# Patient Record
Sex: Female | Born: 1935 | Race: White | Hispanic: No | Smoking: Former smoker
Health system: Southern US, Community
[De-identification: ages and names within clinical notes are randomized; demographics above are authoritative.]

## PROBLEM LIST (undated history)

## (undated) DIAGNOSIS — E039 Hypothyroidism, unspecified: Secondary | ICD-10-CM

## (undated) DIAGNOSIS — C801 Malignant (primary) neoplasm, unspecified: Secondary | ICD-10-CM

## (undated) DIAGNOSIS — Z85118 Personal history of other malignant neoplasm of bronchus and lung: Secondary | ICD-10-CM

## (undated) DIAGNOSIS — M1990 Unspecified osteoarthritis, unspecified site: Secondary | ICD-10-CM

## (undated) DIAGNOSIS — I10 Essential (primary) hypertension: Secondary | ICD-10-CM

## (undated) DIAGNOSIS — J449 Chronic obstructive pulmonary disease, unspecified: Secondary | ICD-10-CM

## (undated) DIAGNOSIS — I809 Phlebitis and thrombophlebitis of unspecified site: Secondary | ICD-10-CM

## (undated) DIAGNOSIS — R0982 Postnasal drip: Secondary | ICD-10-CM

## (undated) DIAGNOSIS — N189 Chronic kidney disease, unspecified: Secondary | ICD-10-CM

## (undated) HISTORY — PX: OTHER SURGICAL HISTORY: SHX169

## (undated) HISTORY — DX: Chronic obstructive pulmonary disease, unspecified: J44.9

## (undated) HISTORY — DX: Hypothyroidism, unspecified: E03.9

## (undated) HISTORY — PX: VESICOVAGINAL FISTULA CLOSURE W/ TAH: SUR271

## (undated) HISTORY — PX: JOINT REPLACEMENT: SHX530

## (undated) HISTORY — DX: Unspecified osteoarthritis, unspecified site: M19.90

## (undated) HISTORY — PX: THYROID SURGERY: SHX805

## (undated) HISTORY — DX: Postnasal drip: R09.82

## (undated) HISTORY — PX: CATARACT EXTRACTION, BILATERAL: SHX1313

## (undated) HISTORY — DX: Personal history of other malignant neoplasm of bronchus and lung: Z85.118

## (undated) HISTORY — PX: TOTAL KNEE ARTHROPLASTY: SHX125

## (undated) HISTORY — PX: THYROIDECTOMY: SHX17

## (undated) HISTORY — PX: ABDOMINAL HYSTERECTOMY: SHX81

## (undated) HISTORY — DX: Essential (primary) hypertension: I10

---

## 1980-11-25 DIAGNOSIS — Z85118 Personal history of other malignant neoplasm of bronchus and lung: Secondary | ICD-10-CM

## 1980-11-25 HISTORY — PX: OTHER SURGICAL HISTORY: SHX169

## 1980-11-25 HISTORY — DX: Personal history of other malignant neoplasm of bronchus and lung: Z85.118

## 1999-09-25 ENCOUNTER — Encounter: Admission: RE | Admit: 1999-09-25 | Discharge: 1999-11-01 | Payer: Self-pay | Admitting: Orthopedic Surgery

## 1999-12-19 ENCOUNTER — Encounter: Admission: RE | Admit: 1999-12-19 | Discharge: 1999-12-19 | Payer: Self-pay | Admitting: Family Medicine

## 1999-12-19 ENCOUNTER — Encounter: Payer: Self-pay | Admitting: Family Medicine

## 1999-12-20 ENCOUNTER — Encounter: Admission: RE | Admit: 1999-12-20 | Discharge: 1999-12-20 | Payer: Self-pay | Admitting: Internal Medicine

## 1999-12-20 ENCOUNTER — Encounter: Payer: Self-pay | Admitting: Internal Medicine

## 2000-12-22 ENCOUNTER — Encounter: Admission: RE | Admit: 2000-12-22 | Discharge: 2000-12-22 | Payer: Self-pay | Admitting: Internal Medicine

## 2000-12-22 ENCOUNTER — Encounter: Payer: Self-pay | Admitting: Internal Medicine

## 2002-01-01 ENCOUNTER — Encounter: Admission: RE | Admit: 2002-01-01 | Discharge: 2002-01-01 | Payer: Self-pay | Admitting: Internal Medicine

## 2002-01-01 ENCOUNTER — Encounter: Payer: Self-pay | Admitting: Internal Medicine

## 2003-01-19 ENCOUNTER — Encounter: Admission: RE | Admit: 2003-01-19 | Discharge: 2003-01-19 | Payer: Self-pay | Admitting: Internal Medicine

## 2003-01-19 ENCOUNTER — Encounter: Payer: Self-pay | Admitting: Internal Medicine

## 2003-11-29 ENCOUNTER — Encounter: Admission: RE | Admit: 2003-11-29 | Discharge: 2003-11-29 | Payer: Self-pay | Admitting: Internal Medicine

## 2004-02-21 ENCOUNTER — Encounter: Admission: RE | Admit: 2004-02-21 | Discharge: 2004-02-21 | Payer: Self-pay | Admitting: Internal Medicine

## 2004-05-24 ENCOUNTER — Encounter: Admission: RE | Admit: 2004-05-24 | Discharge: 2004-05-24 | Payer: Self-pay | Admitting: Internal Medicine

## 2004-06-13 ENCOUNTER — Encounter: Admission: RE | Admit: 2004-06-13 | Discharge: 2004-06-13 | Payer: Self-pay | Admitting: Pulmonary Disease

## 2005-11-04 ENCOUNTER — Ambulatory Visit: Payer: Self-pay | Admitting: Internal Medicine

## 2005-12-24 ENCOUNTER — Encounter: Admission: RE | Admit: 2005-12-24 | Discharge: 2005-12-24 | Payer: Self-pay | Admitting: Internal Medicine

## 2005-12-31 ENCOUNTER — Ambulatory Visit: Payer: Self-pay | Admitting: Internal Medicine

## 2006-12-08 ENCOUNTER — Ambulatory Visit: Payer: Self-pay | Admitting: Internal Medicine

## 2007-01-21 ENCOUNTER — Ambulatory Visit: Payer: Self-pay | Admitting: Internal Medicine

## 2007-01-21 LAB — CONVERTED CEMR LAB
ALT: 41 units/L — ABNORMAL HIGH (ref 0–40)
AST: 31 units/L (ref 0–37)
Albumin: 3.5 g/dL (ref 3.5–5.2)
Alkaline Phosphatase: 54 units/L (ref 39–117)
BUN: 38 mg/dL — ABNORMAL HIGH (ref 6–23)
Bilirubin, Direct: 0.1 mg/dL (ref 0.0–0.3)
CO2: 27 meq/L (ref 19–32)
Calcium: 9.5 mg/dL (ref 8.4–10.5)
Chloride: 103 meq/L (ref 96–112)
Creatinine, Ser: 1.6 mg/dL — ABNORMAL HIGH (ref 0.4–1.2)
GFR calc Af Amer: 40 mL/min
GFR calc non Af Amer: 33 mL/min
Glucose, Bld: 99 mg/dL (ref 70–99)
Potassium: 4.2 meq/L (ref 3.5–5.1)
Sodium: 140 meq/L (ref 135–145)
TSH: 0.8 microintl units/mL (ref 0.35–5.50)
Total Bilirubin: 0.7 mg/dL (ref 0.3–1.2)
Total Protein: 6.5 g/dL (ref 6.0–8.3)

## 2007-02-18 ENCOUNTER — Encounter: Admission: RE | Admit: 2007-02-18 | Discharge: 2007-02-18 | Payer: Self-pay | Admitting: Internal Medicine

## 2007-06-19 DIAGNOSIS — M1990 Unspecified osteoarthritis, unspecified site: Secondary | ICD-10-CM | POA: Insufficient documentation

## 2007-06-19 DIAGNOSIS — I10 Essential (primary) hypertension: Secondary | ICD-10-CM | POA: Insufficient documentation

## 2007-06-19 DIAGNOSIS — E039 Hypothyroidism, unspecified: Secondary | ICD-10-CM | POA: Insufficient documentation

## 2007-06-19 DIAGNOSIS — C3410 Malignant neoplasm of upper lobe, unspecified bronchus or lung: Secondary | ICD-10-CM | POA: Insufficient documentation

## 2007-07-20 ENCOUNTER — Ambulatory Visit: Payer: Self-pay | Admitting: Internal Medicine

## 2007-07-20 DIAGNOSIS — R252 Cramp and spasm: Secondary | ICD-10-CM | POA: Insufficient documentation

## 2007-07-20 DIAGNOSIS — N259 Disorder resulting from impaired renal tubular function, unspecified: Secondary | ICD-10-CM | POA: Insufficient documentation

## 2007-07-22 LAB — CONVERTED CEMR LAB
BUN: 34 mg/dL — ABNORMAL HIGH (ref 6–23)
CO2: 29 meq/L (ref 19–32)
Calcium: 9.9 mg/dL (ref 8.4–10.5)
Chloride: 104 meq/L (ref 96–112)
Creatinine, Ser: 1.5 mg/dL — ABNORMAL HIGH (ref 0.4–1.2)
GFR calc Af Amer: 43 mL/min
GFR calc non Af Amer: 36 mL/min
Glucose, Bld: 84 mg/dL (ref 70–99)
Potassium: 4.5 meq/L (ref 3.5–5.1)
Sodium: 140 meq/L (ref 135–145)
TSH: 0.15 microintl units/mL — ABNORMAL LOW (ref 0.35–5.50)

## 2007-09-18 ENCOUNTER — Ambulatory Visit: Payer: Self-pay | Admitting: Internal Medicine

## 2007-10-16 ENCOUNTER — Telehealth: Payer: Self-pay | Admitting: Internal Medicine

## 2007-12-25 ENCOUNTER — Ambulatory Visit: Payer: Self-pay | Admitting: Internal Medicine

## 2007-12-29 ENCOUNTER — Telehealth (INDEPENDENT_AMBULATORY_CARE_PROVIDER_SITE_OTHER): Payer: Self-pay | Admitting: *Deleted

## 2008-01-11 ENCOUNTER — Encounter: Payer: Self-pay | Admitting: Internal Medicine

## 2008-02-24 ENCOUNTER — Encounter: Admission: RE | Admit: 2008-02-24 | Discharge: 2008-02-24 | Payer: Self-pay | Admitting: Orthopedic Surgery

## 2008-02-24 HISTORY — PX: OTHER SURGICAL HISTORY: SHX169

## 2008-02-25 ENCOUNTER — Inpatient Hospital Stay (HOSPITAL_COMMUNITY): Admission: RE | Admit: 2008-02-25 | Discharge: 2008-02-29 | Payer: Self-pay | Admitting: Orthopedic Surgery

## 2008-02-26 ENCOUNTER — Ambulatory Visit: Payer: Self-pay | Admitting: Vascular Surgery

## 2008-02-26 ENCOUNTER — Encounter (INDEPENDENT_AMBULATORY_CARE_PROVIDER_SITE_OTHER): Payer: Self-pay | Admitting: Orthopedic Surgery

## 2008-06-28 ENCOUNTER — Encounter: Admission: RE | Admit: 2008-06-28 | Discharge: 2008-06-28 | Payer: Self-pay | Admitting: Internal Medicine

## 2010-12-16 ENCOUNTER — Encounter: Payer: Self-pay | Admitting: Internal Medicine

## 2010-12-23 LAB — CONVERTED CEMR LAB
BUN: 19 mg/dL (ref 6–23)
Basophils Relative: 0.6 % (ref 0.0–1.0)
CO2: 30 meq/L (ref 19–32)
Calcium: 10.1 mg/dL (ref 8.4–10.5)
Creatinine, Ser: 1.2 mg/dL (ref 0.4–1.2)
Eosinophils Relative: 2.9 % (ref 0.0–5.0)
GFR calc non Af Amer: 46 mL/min
HCT: 42.5 % (ref 36.0–46.0)
Lymphocytes Relative: 35.2 % (ref 12.0–46.0)
Platelets: 218 10*3/uL (ref 150–400)
Potassium: 4.3 meq/L (ref 3.5–5.1)
RDW: 12 % (ref 11.5–14.6)
Sodium: 140 meq/L (ref 135–145)
TSH: 3.71 microintl units/mL (ref 0.35–5.50)

## 2011-04-09 NOTE — Discharge Summary (Signed)
NAMELYNDLEY, JUVERA NO.:  [id number]   MEDICAL RECORD NO.:  [id number]          PATIENT TYPE:  INP   LOCATION:  5012                         FACILITY:  [facility]   PHYSICIAN:  Metta Clines. Supple, M.D.  DATE OF BIRTH:  1929/05/25   DATE OF ADMISSION:  02/25/2008  DATE OF DISCHARGE:  02/29/2008                               DISCHARGE SUMMARY   ADMITTING DIAGNOSES:  1. Osteoarthrosis right knee.  2. Hypertension.  3. History of lung cancer.  4. Recent finding of increased left upper lobe mass with CT scan      recommending biopsy and followup.   DISCHARGE DIAGNOSES:  1. Osteoarthrosis right knee.  2. Hypertension.  3. History of lung cancer.  4. Recent finding of increased left upper lobe mass with CT scan      recommending biopsy and followup.   OPERATIONS:  Right total knee arthroplasty.   SURGEON:  Metta Clines. Supple, M.D.   Terrence DupontOlivia Mackie A. Shuford, P.A.-C.   ANESTHESIA:  General with femoral nerve block.   BRIEF HISTORY:  Ms. Casarella is a pleasant 77 year old female who has  been followed by [place] outpatient for end-stage osteoarthrosis of her right  knee.  The patient has failed outpatient conservative measures and had  bone on bone deformity on plain film x-rays.  The risks and benefits  were discussed with the patient.  The preoperative workup did show some  enlargement of a previous left upper lobe mass and she had a remote  history of lung cancer.  She had had a previous workup by CT scan back  in 2004.  It was recommended that this could be followed up as an  outpatient after the elective knee replacement.  This will be arranged.   HOSPITAL COURSE:  The patient was admitted and underwent the above named  procedure and tolerated this well.  All appropriate IV antibiotics and  analgesics were utilized.  Postoperatively she was placed on DVT  prophylaxis with Coumadin and all IV antibiotics and analgesics were  utilized.  She was begun standard formal  physical therapy weightbearing  as tolerated.  She transitioned well.  Due to her home situation at a  skilled bed short term nursing facility rehabilitation was felt to be  most appropriate.  The patient remained afebrile through the weekend.  On today's date, Sunday, February 28, 2008, the patient was afebrile.  She  was eating and doing well on p.o. analgesics.  Her incision was clean  and dry.  Calf was soft and nontender.  She had had a negative Doppler  postoperatively as she did have some calf pain on the first day  postoperatively.  At this time she was hemodynamically stable and ready  for discharge tentatively to follow to [place].   LABORATORY DATA:  Shows admission labs all within normal limits.  Postoperatively remained hemodynamically stable.  Hemoglobin was stable  on both April 4 and 5 at 8.0.  Chemistries postoperatively also within  normal limits.  Chest x-ray and CT scan as above.  EKG shows sinus  rhythm with occasional  PVCs.   CONDITION ON DISCHARGE:  Stable and improved.   DISCHARGE MEDICATIONS AND PLANS:  The patient is being discharged to  skilled care facility where she will be weightbearing as tolerated,  total knee replacement protocol.  She may shower on day 5 from surgery.  Leave Steri-Strips on the skin.  Will apply clean dry dressing as  needed.  She should follow up in our office at 2 weeks from surgery.  Please call and arrange time and transportation.  She is on a regular  diet.  She is on the following medications:   DISCHARGE MEDICATIONS:  1. Premarin 0.3 mg daily.  This is currently on hold while on      Coumadin.  2. Synthroid 50 mcg daily.  3. Tenormin 50 mg daily.  4. Zestril/Prinivil 20 mg daily.  5. Coumadin per pharmacy protocol.  6. Docusate sodium 100 mg daily.  7. Tylenol 1-2 every 4-6 p.r.n. pain.  8. Robaxin 500 mg one every 8 p.r.n. spasm.  9. Ambien 5 mg daily at bedtime p.r.n. sleep.  10.Vicodin 1-2 every 4-6 p.r.n.  pain.   Call our office for any further difficulties or problems.  Again,  condition on discharge stable and improved.      Tracy A. Shuford, P.A.-C.      Metta Clines. Supple, M.D.  Electronically Signed    TAS/MEDQ  D:  02/28/2008  T:  02/28/2008  Job:  [id number]

## 2011-04-09 NOTE — Op Note (Signed)
Valerie Young, Valerie Young NO.:  [id number]   MEDICAL RECORD NO.:  [id number]          PATIENT TYPE:  INP   LOCATION:  2899                         FACILITY:  [facility]   PHYSICIAN:  Metta Clines. Supple, M.D.  DATE OF BIRTH:  [birth year]   DATE OF PROCEDURE:  02/25/2008  DATE OF DISCHARGE:                               OPERATIVE REPORT   PREOPERATIVE DIAGNOSIS:  End-stage right knee osteoarthrosis.   POSTOPERATIVE DIAGNOSIS:  End-stage right knee osteoarthrosis.   PROCEDURE:  Cemented right total knee arthroplasty utilizing a DePuy  segment implant, size 2.5 femur and size 3 tibia, a 15-mm thick rotating  platform, polyethylene insert, and a 32-mm patellar button.   SURGEON:  Metta Clines. Supple, M.D.   ASSISTANTOlivia Mackie Shuford, P.A.-C.   ANESTHESIA:  With an LMA, general as well as a femoral nerve block.   TOURNIQUET TIME:  50 minutes.   ESTIMATED BLOOD LOSS:  Minimal.   DRAINS:  Hemovac x1.   HISTORY:  Valerie Young is a 77 year old female who has had chronic  progressively increasing right knee pain secondary to end-stage  osteoarthrosis.  She is brought to the operating room at this time for  planned right total knee arthroplasty.   Preoperatively, we counseled Valerie Young on treatment options as well as  risks versus benefits thereof.  Possible surgical complications of  bleeding, infection, neurovascular injury, DVT, PE, persistent pain,  loss of motion and possible need for additional surgery were reviewed.  She understands and accepts and agrees with planned procedure.   PROCEDURE IN DETAIL:  After undergoing routine preoperative evaluation  including a chest CT scan for evaluation of a chronic pulmonary nodule  which did show some slight enlargement, I did discuss this with the  radiologist and they recommended ultimately a biopsy of the mass.  It  did not appear, however, to be compromising her pulmonary function, and  it was felt that this was clearly a chronic  situation, that any further  diagnostic workup could be postponed until she had completed her surgery  and subsequent postoperative recovery.  I did discuss this with Ms.  Young and she was in agreement.   The patient ultimately received prophylactic antibiotics, and a femoral  nerve block was established in the holding area by the anesthesia  department.  She was placed supine on the operating table and underwent  smooth induction of an LMA general anesthesia.  A Foley catheter was  placed.  The right leg was then sterilely prepped and draped in standard  fashion.  The leg was exsanguinated with the tourniquet inflated to 300  mmHg.   A midline incision was then made approximately 12 cm in length centered  over the patella.  Skin flaps were circumferentially mobilized.  Medial  parapatellar arthrotomy was then performed and the patella everted.  The  fat pad was debulked.  The remnants of the menisci and cruciate  ligaments were excised.  The knee was flexed up, and a drill was used to  gain access to the femoral medullary canal with an intramedullary guide  being passed.  We then resected 11 mm of bone from the distal femur with  a 5-degree valgus cut.  The distal femur was then measured at between a  2.5 and 3, and we proceeded with the 2.5 chamfer cuts.  The distal femur  was then trialed, and this showed excellent fit.  We then turned our  attention to the proximal tibia with the extramedullary guide used to  measure resection measured 10 mm from the lateral tibial plateau, and  this section of bone was then removed with an oscillating saw.  We then  sized the proximal tibia, and the size 3 had the best coverage.  This  was then pinned into position.  We performed a trial reduction with the  implants, and this showed excellent knee mobility with good motion and  good soft tissue balance.  We then completed the preparation of the  proximal tibia using the intramedullary reamer and  then broach.  We then  completed the distal femur with the box cutting guide and then performed  a final trial reduction with the box  input  and good sizing of the  implants, good soft tissue balance, and excellent knee motion.  Our  attention was then turned to the patella where we resected approximately  8 mm of bone from along the articular surface and then applied the  stabilizing drill guide for drilling the stabilizing drill holes, and  the trial 32-mm button again showed good coverage of the patella.  At  this point, an osteotome was used to remove bone fragments from the  posterior aspect of the femoral condyles.  The posterior compartments  were meticulously debrided of all residual meniscal and soft tissue  debris.  Pulsatile lavage was used to meticulously clean the joint.  We  then mixed the cement, and at the appropriate consistency, the implants  were all cemented into position.  Meticulous removal all extra cement  was completed.  The knee was then taken through a range of motion and  showed good soft tissue balance.  We performed trials with 12.5 and 15-  mm implants.  The 15-mm implant had the best soft tissue balance.  The  final 15-mm tibial insert was then placed.  The knee was again taken  through a final motion, showing excellent soft tissue balance.  At this  point, we then let down the tourniquet.  A Hemovac drain was brought out  superolaterally.  Hemostasis was obtained and the parapatellar  arthrotomy was closed with interrupted figure-of-eight #1 Vicryl  sutures, 2-0 Vicryl for the subcu and intracuticular 3-0 Monocryl for  the skin followed by Steri-Strips.  A bulky sterile dressing was  applied.  We did give an additional gram of Ancef when the tourniquet  was let down.  The patient was then extubated and taken to the recovery  room in stable condition.      Metta Clines. Supple, M.D.  Electronically Signed     KMS/MEDQ  D:  02/25/2008  T:  02/25/2008   Job:  [id number]

## 2011-08-19 LAB — COMPREHENSIVE METABOLIC PANEL
ALT: 17
Alkaline Phosphatase: 67
BUN: 19
Calcium: 9.7
Chloride: 104
GFR calc non Af Amer: 39 — ABNORMAL LOW
Potassium: 4.2
Sodium: 138

## 2011-08-19 LAB — PROTIME-INR: INR: 0.9

## 2011-08-19 LAB — CBC
HCT: 39.8
MCV: 97.4
RBC: 4.09
RDW: 12.8
WBC: 10.3

## 2011-08-19 LAB — URINALYSIS, ROUTINE W REFLEX MICROSCOPIC
Hgb urine dipstick: NEGATIVE
Protein, ur: NEGATIVE

## 2011-08-20 LAB — BASIC METABOLIC PANEL
BUN: 15
CO2: 22
Chloride: 103
Chloride: 105
GFR calc non Af Amer: 42 — ABNORMAL LOW
GFR calc non Af Amer: 44 — ABNORMAL LOW
Glucose, Bld: 134 — ABNORMAL HIGH
Potassium: 4.3
Potassium: 4.5
Sodium: 137

## 2011-08-20 LAB — CBC
HCT: 23.3 — ABNORMAL LOW
HCT: 28.4 — ABNORMAL LOW
Hemoglobin: 8 — ABNORMAL LOW
MCHC: 33.8
MCHC: 33.9
MCV: 97
Platelets: 136 — ABNORMAL LOW
RBC: 2.38 — ABNORMAL LOW
RDW: 12.7
RDW: 12.9
WBC: 11.9 — ABNORMAL HIGH
WBC: 13.8 — ABNORMAL HIGH

## 2011-08-20 LAB — TYPE AND SCREEN
ABO/RH(D): O POS
Antibody Screen: NEGATIVE

## 2011-08-20 LAB — PROTIME-INR
INR: 3.2 — ABNORMAL HIGH
Prothrombin Time: 14.7
Prothrombin Time: 20.9 — ABNORMAL HIGH
Prothrombin Time: 25.3 — ABNORMAL HIGH

## 2011-08-20 LAB — HEMOGLOBIN AND HEMATOCRIT, BLOOD: HCT: 28 — ABNORMAL LOW

## 2012-03-05 DIAGNOSIS — M79609 Pain in unspecified limb: Secondary | ICD-10-CM | POA: Diagnosis not present

## 2012-03-05 DIAGNOSIS — Z1331 Encounter for screening for depression: Secondary | ICD-10-CM | POA: Diagnosis not present

## 2012-04-07 DIAGNOSIS — G5760 Lesion of plantar nerve, unspecified lower limb: Secondary | ICD-10-CM | POA: Diagnosis not present

## 2012-05-05 DIAGNOSIS — G5760 Lesion of plantar nerve, unspecified lower limb: Secondary | ICD-10-CM | POA: Diagnosis not present

## 2012-06-05 DIAGNOSIS — M79609 Pain in unspecified limb: Secondary | ICD-10-CM | POA: Diagnosis not present

## 2012-06-05 DIAGNOSIS — I10 Essential (primary) hypertension: Secondary | ICD-10-CM | POA: Diagnosis not present

## 2012-06-05 DIAGNOSIS — E039 Hypothyroidism, unspecified: Secondary | ICD-10-CM | POA: Diagnosis not present

## 2012-06-05 DIAGNOSIS — E559 Vitamin D deficiency, unspecified: Secondary | ICD-10-CM | POA: Diagnosis not present

## 2012-06-05 DIAGNOSIS — N183 Chronic kidney disease, stage 3 unspecified: Secondary | ICD-10-CM | POA: Diagnosis not present

## 2012-06-05 DIAGNOSIS — J310 Chronic rhinitis: Secondary | ICD-10-CM | POA: Diagnosis not present

## 2012-06-05 DIAGNOSIS — M1710 Unilateral primary osteoarthritis, unspecified knee: Secondary | ICD-10-CM | POA: Diagnosis not present

## 2012-06-05 DIAGNOSIS — IMO0002 Reserved for concepts with insufficient information to code with codable children: Secondary | ICD-10-CM | POA: Diagnosis not present

## 2012-06-05 DIAGNOSIS — Z1331 Encounter for screening for depression: Secondary | ICD-10-CM | POA: Diagnosis not present

## 2012-07-14 DIAGNOSIS — E860 Dehydration: Secondary | ICD-10-CM | POA: Diagnosis not present

## 2012-10-02 DIAGNOSIS — R61 Generalized hyperhidrosis: Secondary | ICD-10-CM | POA: Diagnosis not present

## 2012-10-02 DIAGNOSIS — R350 Frequency of micturition: Secondary | ICD-10-CM | POA: Diagnosis not present

## 2012-10-06 DIAGNOSIS — I10 Essential (primary) hypertension: Secondary | ICD-10-CM | POA: Diagnosis not present

## 2012-10-06 DIAGNOSIS — Z79899 Other long term (current) drug therapy: Secondary | ICD-10-CM | POA: Diagnosis not present

## 2012-10-06 DIAGNOSIS — R350 Frequency of micturition: Secondary | ICD-10-CM | POA: Diagnosis not present

## 2012-10-06 DIAGNOSIS — Z23 Encounter for immunization: Secondary | ICD-10-CM | POA: Diagnosis not present

## 2012-10-06 DIAGNOSIS — M1710 Unilateral primary osteoarthritis, unspecified knee: Secondary | ICD-10-CM | POA: Diagnosis not present

## 2012-10-06 DIAGNOSIS — Z1331 Encounter for screening for depression: Secondary | ICD-10-CM | POA: Diagnosis not present

## 2012-10-06 DIAGNOSIS — Z0000 Encounter for general adult medical examination without abnormal findings: Secondary | ICD-10-CM | POA: Diagnosis not present

## 2012-10-06 DIAGNOSIS — N183 Chronic kidney disease, stage 3 unspecified: Secondary | ICD-10-CM | POA: Diagnosis not present

## 2012-10-06 DIAGNOSIS — R61 Generalized hyperhidrosis: Secondary | ICD-10-CM | POA: Diagnosis not present

## 2012-10-06 DIAGNOSIS — IMO0002 Reserved for concepts with insufficient information to code with codable children: Secondary | ICD-10-CM | POA: Diagnosis not present

## 2012-10-06 DIAGNOSIS — J310 Chronic rhinitis: Secondary | ICD-10-CM | POA: Diagnosis not present

## 2012-10-07 ENCOUNTER — Other Ambulatory Visit: Payer: Self-pay | Admitting: Internal Medicine

## 2012-10-07 DIAGNOSIS — R350 Frequency of micturition: Secondary | ICD-10-CM

## 2012-10-07 DIAGNOSIS — R7989 Other specified abnormal findings of blood chemistry: Secondary | ICD-10-CM

## 2012-10-07 DIAGNOSIS — R61 Generalized hyperhidrosis: Secondary | ICD-10-CM

## 2012-10-08 ENCOUNTER — Ambulatory Visit
Admission: RE | Admit: 2012-10-08 | Discharge: 2012-10-08 | Disposition: A | Discharge status: Home / Self Care | Payer: Medicare Other | Source: Ambulatory Visit | Attending: Internal Medicine | Admitting: Internal Medicine

## 2012-10-08 ENCOUNTER — Other Ambulatory Visit: Payer: Self-pay | Admitting: Internal Medicine

## 2012-10-08 DIAGNOSIS — R350 Frequency of micturition: Secondary | ICD-10-CM

## 2012-10-08 DIAGNOSIS — R61 Generalized hyperhidrosis: Secondary | ICD-10-CM

## 2012-10-08 DIAGNOSIS — R7989 Other specified abnormal findings of blood chemistry: Secondary | ICD-10-CM

## 2012-10-08 DIAGNOSIS — R944 Abnormal results of kidney function studies: Secondary | ICD-10-CM | POA: Diagnosis not present

## 2012-10-08 DIAGNOSIS — R918 Other nonspecific abnormal finding of lung field: Secondary | ICD-10-CM

## 2012-10-13 ENCOUNTER — Ambulatory Visit
Admission: RE | Admit: 2012-10-13 | Discharge: 2012-10-13 | Disposition: A | Discharge status: Home / Self Care | Payer: Medicare Other | Source: Ambulatory Visit | Attending: Internal Medicine | Admitting: Internal Medicine

## 2012-10-13 DIAGNOSIS — R918 Other nonspecific abnormal finding of lung field: Secondary | ICD-10-CM

## 2012-10-13 DIAGNOSIS — J438 Other emphysema: Secondary | ICD-10-CM | POA: Diagnosis not present

## 2012-10-13 DIAGNOSIS — R911 Solitary pulmonary nodule: Secondary | ICD-10-CM | POA: Diagnosis not present

## 2012-10-20 DIAGNOSIS — R918 Other nonspecific abnormal finding of lung field: Secondary | ICD-10-CM | POA: Diagnosis not present

## 2012-10-20 DIAGNOSIS — N183 Chronic kidney disease, stage 3 unspecified: Secondary | ICD-10-CM | POA: Diagnosis not present

## 2012-10-20 DIAGNOSIS — I10 Essential (primary) hypertension: Secondary | ICD-10-CM | POA: Diagnosis not present

## 2012-10-20 DIAGNOSIS — R350 Frequency of micturition: Secondary | ICD-10-CM | POA: Diagnosis not present

## 2012-10-20 DIAGNOSIS — N2581 Secondary hyperparathyroidism of renal origin: Secondary | ICD-10-CM | POA: Diagnosis not present

## 2012-10-20 DIAGNOSIS — R61 Generalized hyperhidrosis: Secondary | ICD-10-CM | POA: Diagnosis not present

## 2012-10-20 DIAGNOSIS — M159 Polyosteoarthritis, unspecified: Secondary | ICD-10-CM | POA: Diagnosis not present

## 2012-10-20 DIAGNOSIS — E039 Hypothyroidism, unspecified: Secondary | ICD-10-CM | POA: Diagnosis not present

## 2012-11-03 ENCOUNTER — Other Ambulatory Visit (HOSPITAL_COMMUNITY): Payer: Self-pay | Admitting: Nephrology

## 2012-11-03 DIAGNOSIS — N183 Chronic kidney disease, stage 3 unspecified: Secondary | ICD-10-CM

## 2012-11-10 ENCOUNTER — Encounter (HOSPITAL_COMMUNITY)
Admission: RE | Admit: 2012-11-10 | Discharge: 2012-11-10 | Disposition: A | Discharge status: Home / Self Care | Payer: Medicare Other | Source: Ambulatory Visit | Attending: Nephrology | Admitting: Nephrology

## 2012-11-10 DIAGNOSIS — N183 Chronic kidney disease, stage 3 unspecified: Secondary | ICD-10-CM

## 2012-11-10 DIAGNOSIS — R944 Abnormal results of kidney function studies: Secondary | ICD-10-CM | POA: Insufficient documentation

## 2012-11-10 DIAGNOSIS — N289 Disorder of kidney and ureter, unspecified: Secondary | ICD-10-CM | POA: Diagnosis not present

## 2012-11-10 MED ORDER — TECHNETIUM TC 99M MERTIATIDE
15.0000 | Freq: Once | INTRAVENOUS | Status: AC | PRN
  Administered 2012-11-10: 15 via INTRAVENOUS

## 2012-12-02 DIAGNOSIS — N183 Chronic kidney disease, stage 3 unspecified: Secondary | ICD-10-CM | POA: Diagnosis not present

## 2012-12-03 ENCOUNTER — Ambulatory Visit (INDEPENDENT_AMBULATORY_CARE_PROVIDER_SITE_OTHER): Payer: Medicare Other | Admitting: Internal Medicine

## 2012-12-03 ENCOUNTER — Other Ambulatory Visit (INDEPENDENT_AMBULATORY_CARE_PROVIDER_SITE_OTHER): Payer: Medicare Other

## 2012-12-03 ENCOUNTER — Ambulatory Visit (INDEPENDENT_AMBULATORY_CARE_PROVIDER_SITE_OTHER)
Admission: RE | Admit: 2012-12-03 | Discharge: 2012-12-03 | Disposition: A | Discharge status: Home / Self Care | Payer: Medicare Other | Source: Ambulatory Visit | Attending: Internal Medicine | Admitting: Internal Medicine

## 2012-12-03 ENCOUNTER — Encounter: Payer: Self-pay | Admitting: Internal Medicine

## 2012-12-03 VITALS — BP 140/80 | HR 69 | Ht 64.0 in | Wt 124.4 lb

## 2012-12-03 DIAGNOSIS — D499 Neoplasm of unspecified behavior of unspecified site: Secondary | ICD-10-CM

## 2012-12-03 DIAGNOSIS — R222 Localized swelling, mass and lump, trunk: Secondary | ICD-10-CM

## 2012-12-03 DIAGNOSIS — R918 Other nonspecific abnormal finding of lung field: Secondary | ICD-10-CM | POA: Diagnosis not present

## 2012-12-03 DIAGNOSIS — Z85118 Personal history of other malignant neoplasm of bronchus and lung: Secondary | ICD-10-CM | POA: Diagnosis not present

## 2012-12-03 DIAGNOSIS — R911 Solitary pulmonary nodule: Secondary | ICD-10-CM | POA: Insufficient documentation

## 2012-12-03 DIAGNOSIS — R9389 Abnormal findings on diagnostic imaging of other specified body structures: Secondary | ICD-10-CM

## 2012-12-03 DIAGNOSIS — J438 Other emphysema: Secondary | ICD-10-CM | POA: Diagnosis not present

## 2012-12-03 LAB — COMPREHENSIVE METABOLIC PANEL
Albumin: 4 g/dL (ref 3.5–5.2)
Alkaline Phosphatase: 60 U/L (ref 39–117)
CO2: 28 mEq/L (ref 19–32)
Calcium: 9.8 mg/dL (ref 8.4–10.5)
Chloride: 102 mEq/L (ref 96–112)
GFR: 37.06 mL/min — ABNORMAL LOW (ref >60.00)
Glucose, Bld: 105 mg/dL — ABNORMAL HIGH (ref 70–99)
Potassium: 3.6 mEq/L (ref 3.5–5.1)
Sodium: 138 mEq/L (ref 135–145)
Total Protein: 7.6 g/dL (ref 6.0–8.3)

## 2012-12-03 LAB — CBC
RBC: 3.88 Mil/uL (ref 3.87–5.11)
WBC: 9.5 10*3/uL (ref 4.5–10.5)

## 2012-12-03 LAB — APTT: aPTT: 25 s (ref 21.7–28.8)

## 2012-12-03 NOTE — Progress Notes (Signed)
12/03/12- [name] former smoker with an enlarging LUL Lesion- Dr Josetta Huddle. Daughter Valerie Young here. She was treated for urinary infection in November of 2013. At that time chest x-ray showed increase in a chronic left upper lobe density. She gives history that she had been evaluated at this office some years ago. I find no old records including a review of [facility], back to about 2005, of a pulmonologist evaluation. She thinks she just had imaging, no interventional procedure. She says the doctor told her he didn't know what she had at that she was stable, to be followed long-term. She insists she has felt well with no cough, dyspnea,  sputum, nothing bloody or purulent, no chest pain or adenopathy, no weight loss. I reviewed the CT images from November 2013 back to 2005. There has been marked progression since that time of an infiltrate in the left upper lobe, starting peripherally and now extending to the left hilum. She has history of  right upper lobectomy for cancer in May of 1981 in [place], without radiation or chemotherapy. Complicating health problems have included chronic kidney disease stage III, thyroid surgery for goiter. No hx TB exposure. She is retired from office work, divorced, living alone. Quit smoking in 1981. Denies family history of lung disease. CT chest 10/09/12: IMPRESSION:  1. Enlarging airspace opacity in the left upper lobe with  prominent air bronchograms. Although this could be some type of  chronic infiltrate a slow growing adenocarcinoma needs to be  excluded. Bronchoscopic or percutaneous biopsy may be indicated.  PET CT may not be helpful in this case.  2. Stable borderline mediastinal and hilar lymph nodes.  3. Stable 6 mm pulmonary nodule in the right lower lobe.  4. Stable underlying emphysematous changes.  Original Report Authenticated By: Marijo Sanes, M.D.   Prior to Admission medications   Medication Sig Start Date End Date Taking?  Authorizing Provider  atenolol (TENORMIN) 50 MG tablet Take 1 tablet by mouth daily. 09/30/12  Yes Historical Provider, MD  cholecalciferol (VITAMIN D) 1000 UNITS tablet Take 1,000 Units by mouth daily.   Yes Historical Provider, MD  estradiol (ESTRACE) 0.5 MG tablet Take 1 tablet by mouth daily. 09/30/12  Yes Historical Provider, MD  furosemide (LASIX) 20 MG tablet Take 20 mg by mouth daily.   Yes Historical Provider, MD  levothyroxine (SYNTHROID, LEVOTHROID) 50 MCG tablet Take 1 tablet by mouth daily. 09/30/12  Yes Historical Provider, MD  lisinopril (PRINIVIL,ZESTRIL) 20 MG tablet Take 1 tablet by mouth daily. 09/30/12  Yes Historical Provider, MD   Past Medical History  Diagnosis Date  . Hypertension   . Hypothyroidism   . COPD (chronic obstructive pulmonary disease)   . DJD (degenerative joint disease)     knees  . PND (post-nasal drip)     Chronic  . H/O: lung cancer 1982    Right; treated with resection   Past Surgical History  Procedure Date  . Total knee arthroplasty     Right  . Thyroidectomy   . Vesicovaginal fistula closure w/ tah   . Right lung cancer treated with resection 1982  . Right thigh postoperative hematoma 02-2008   Family History  Problem Relation Age of Onset  . CVA Mother 22    hemorrhagic    History   Social History  . Marital Status: Divorced    Spouse Name: N/A    Number of Children: N/A  . Years of Education: N/A   Occupational History  .  Retired    Social History Main Topics  . Smoking status: Former Smoker -- 1.0 packs/day for 35 years    Types: Cigarettes    Quit date: 11/25/1980  . Smokeless tobacco: Not on file  . Alcohol Use: No  . Drug Use: No  . Sexually Active: Not on file   Other Topics Concern  . Not on file   Social History Narrative  . No narrative on file   ROS-see HPI Constitutional:   No-   weight loss, night sweats, fevers, chills, fatigue, lassitude. HEENT:   No-  headaches, difficulty swallowing, tooth/dental  problems, sore throat,       No-  sneezing, itching, ear ache, nasal congestion, post nasal drip,  CV:  No-   chest pain, orthopnea, PND, swelling in lower extremities, anasarca, dizziness, palpitations Resp: No-   shortness of breath with exertion or at rest.              No-   productive cough,  No non-productive cough,  No- coughing up of blood.              No-   change in color of mucus.  No- wheezing.   Skin: No-   rash or lesions. GI:  No-   heartburn, indigestion, abdominal pain, nausea, vomiting, diarrhea,                 change in bowel habits, loss of appetite GU: No-   dysuria, change in color of urine, no urgency or frequency.  No- flank pain. MS:  No- acute  joint pain or swelling.  No- decreased range of motion.  No- back pain. Neuro-     nothing unusual Psych:  No- change in mood or affect. No depression or anxiety.  No memory loss.  OBJ- Physical Exam General- Alert, Oriented, Affect-appropriate, Distress- none acute,  Trim, looking younger than stated age Skin- rash-none, lesions- none, excoriation- none Lymphadenopathy- none Head- atraumatic            Eyes- Gross vision intact, PERRLA, conjunctivae and secretions clear            Ears- Hearing, canals-normal            Nose- Clear, no-Septal dev, mucus, polyps, erosion, perforation             Throat- Mallampati III , mucosa clear , drainage- none, tonsils- atrophic. Dentures Neck- flexible , trachea midline, no stridor , thyroid nl, carotid no bruit Chest - symmetrical excursion , unlabored           Heart/CV- RRR , no murmur , no gallop  , no rub, nl s1 s2                           - JVD- none , edema- none, stasis changes- none, varices- none           Lung- clear to P&A, wheeze- none, cough- none , dullness-none, rub- none           Chest wall- old right thoracotomy scar Abd- tender-no, distended-no, bowel sounds-present, HSM- no Br/ Gen/ Rectal- Not done, not indicated Extrem- cyanosis- none, clubbing, none,  atrophy- none, strength- nl Neuro- grossly intact to observation

## 2012-12-03 NOTE — Patient Instructions (Addendum)
Order- CXR   Left upper lobe mass  Order- lab   CBC, CMET, ANA, Sed rate, Pt, PTT  I will be discussing bronchoscopy with one of my colleagues here and we will call you soon about this.

## 2012-12-03 NOTE — Assessment & Plan Note (Signed)
There has been a dramatic progression since 2005, extending from periphery LUL to left hilum as of 09/2012  This is a slowly progressive adenocarcinoma until proven otherwise.  Plan-obtain chest x-ray, basic labs, schedule PFT. I've recommended bronchoscopy over needle biopsy- discuss with colleagues. Radiology review of the most recent CT did not favor a PET scan.

## 2012-12-04 ENCOUNTER — Telehealth: Payer: Self-pay | Admitting: Pulmonary Disease

## 2012-12-04 LAB — ANA: Anti Nuclear Antibody(ANA): NEGATIVE

## 2012-12-04 NOTE — Telephone Encounter (Signed)
See if she can do thurs.

## 2012-12-04 NOTE — Telephone Encounter (Signed)
KC, bronch cannot be done on wed., 12/09/12. There is availability on Thurs., 12/10/12 @ 7:30. Pls advise.

## 2012-12-04 NOTE — Progress Notes (Signed)
Quick Note:  Pt aware of results; states KC has already spoken with her about procedure to see what exactly it is. Paper chart ordered for CY. ______

## 2012-12-04 NOTE — Telephone Encounter (Signed)
The pt says Thurs., 12/10/12 is not good for her but Fri., 12/11/12 @ 7:30 would be good. Is this okay with you KC?

## 2012-12-04 NOTE — Telephone Encounter (Signed)
Per [facility], he cannot do this on Fri., 12/11/12.  Pt states she does not want to wait until next week and agrees to have the bronch on Tues., 12/08/12 @ 7:30. Pt given all instructions, NPO after midnight on Monday night and her daughter will be with her todrive her home.

## 2012-12-04 NOTE — Telephone Encounter (Signed)
Please see if we can set up bronch for this pt next wed at 730.  Need small scope, no tb risk, and need fluoro I have already discussed with pt, and she will just need to get all the info and instructions.  Let me know.

## 2012-12-04 NOTE — Progress Notes (Signed)
Quick Note:  Pt aware of results. ______ 

## 2012-12-07 ENCOUNTER — Encounter (HOSPITAL_COMMUNITY): Payer: Self-pay

## 2012-12-08 ENCOUNTER — Encounter (HOSPITAL_COMMUNITY)
Admission: RE | Disposition: A | Discharge status: Home / Self Care | Payer: Self-pay | Source: Ambulatory Visit | Attending: Pulmonary Disease

## 2012-12-08 ENCOUNTER — Encounter (HOSPITAL_COMMUNITY): Payer: Self-pay | Admitting: Respiratory Therapy

## 2012-12-08 ENCOUNTER — Ambulatory Visit (HOSPITAL_COMMUNITY): Payer: Medicare Other

## 2012-12-08 ENCOUNTER — Ambulatory Visit (HOSPITAL_COMMUNITY)
Admission: RE | Admit: 2012-12-08 | Discharge: 2012-12-08 | Disposition: A | Discharge status: Home / Self Care | Payer: Medicare Other | Source: Ambulatory Visit | Attending: Pulmonary Disease | Admitting: Pulmonary Disease

## 2012-12-08 DIAGNOSIS — J8410 Pulmonary fibrosis, unspecified: Secondary | ICD-10-CM | POA: Diagnosis not present

## 2012-12-08 DIAGNOSIS — I10 Essential (primary) hypertension: Secondary | ICD-10-CM | POA: Diagnosis not present

## 2012-12-08 DIAGNOSIS — Z96659 Presence of unspecified artificial knee joint: Secondary | ICD-10-CM | POA: Diagnosis not present

## 2012-12-08 DIAGNOSIS — R918 Other nonspecific abnormal finding of lung field: Secondary | ICD-10-CM | POA: Diagnosis not present

## 2012-12-08 DIAGNOSIS — Z87891 Personal history of nicotine dependence: Secondary | ICD-10-CM | POA: Diagnosis not present

## 2012-12-08 DIAGNOSIS — E039 Hypothyroidism, unspecified: Secondary | ICD-10-CM | POA: Diagnosis not present

## 2012-12-08 DIAGNOSIS — J449 Chronic obstructive pulmonary disease, unspecified: Secondary | ICD-10-CM | POA: Diagnosis not present

## 2012-12-08 DIAGNOSIS — Z85118 Personal history of other malignant neoplasm of bronchus and lung: Secondary | ICD-10-CM | POA: Diagnosis not present

## 2012-12-08 DIAGNOSIS — Z79899 Other long term (current) drug therapy: Secondary | ICD-10-CM | POA: Insufficient documentation

## 2012-12-08 DIAGNOSIS — J984 Other disorders of lung: Secondary | ICD-10-CM | POA: Diagnosis not present

## 2012-12-08 DIAGNOSIS — J4489 Other specified chronic obstructive pulmonary disease: Secondary | ICD-10-CM | POA: Insufficient documentation

## 2012-12-08 DIAGNOSIS — C3410 Malignant neoplasm of upper lobe, unspecified bronchus or lung: Secondary | ICD-10-CM | POA: Diagnosis not present

## 2012-12-08 DIAGNOSIS — J988 Other specified respiratory disorders: Secondary | ICD-10-CM | POA: Diagnosis not present

## 2012-12-08 HISTORY — PX: VIDEO BRONCHOSCOPY: SHX5072

## 2012-12-08 LAB — BODY FLUID CELL COUNT WITH DIFFERENTIAL
Lymphs, Fluid: 4 %
Monocyte-Macrophage-Serous Fluid: 4 % — ABNORMAL LOW (ref 50–90)
Total Nucleated Cell Count, Fluid: 155 cu mm (ref 0–1000)

## 2012-12-08 SURGERY — BRONCHOSCOPY, WITH FLUOROSCOPY
Anesthesia: Moderate Sedation | Laterality: Bilateral

## 2012-12-08 MED ORDER — LIDOCAINE HCL 2 % EX GEL
CUTANEOUS | Status: DC | PRN
  Administered 2012-12-08: 1

## 2012-12-08 MED ORDER — MIDAZOLAM HCL 10 MG/2ML IJ SOLN
INTRAMUSCULAR | Status: AC
  Filled 2012-12-08: qty 4

## 2012-12-08 MED ORDER — LIDOCAINE HCL 1 % IJ SOLN
INTRAMUSCULAR | Status: DC | PRN
  Administered 2012-12-08: 6 mL via RESPIRATORY_TRACT

## 2012-12-08 MED ORDER — MIDAZOLAM HCL 10 MG/2ML IJ SOLN
INTRAMUSCULAR | Status: DC | PRN
  Administered 2012-12-08: 5 mg via INTRAVENOUS

## 2012-12-08 MED ORDER — PHENYLEPHRINE HCL 0.25 % NA SOLN
NASAL | Status: DC | PRN
  Administered 2012-12-08: 1 via NASAL

## 2012-12-08 MED ORDER — SODIUM CHLORIDE 0.9 % IV SOLN: INTRAVENOUS | Status: DC

## 2012-12-08 MED ORDER — MEPERIDINE HCL 100 MG/ML IJ SOLN
INTRAMUSCULAR | Status: AC
  Filled 2012-12-08: qty 2

## 2012-12-08 MED ORDER — MEPERIDINE HCL 25 MG/ML IJ SOLN
INTRAMUSCULAR | Status: DC | PRN
  Administered 2012-12-08: 50 mg via INTRAVENOUS

## 2012-12-08 NOTE — Interval H&P Note (Signed)
History and Physical Interval Note:  12/08/2012 7:45 AM  Valerie Young  has presented today for surgery, with the diagnosis of Abn x-ry  The various methods of treatment have been discussed with the patient and family. After consideration of risks, benefits and other options for treatment, the patient has consented to  Procedure(s) (LRB) with comments: VIDEO BRONCHOSCOPY WITH FLUORO (Bilateral) as a surgical intervention .  The patient's history has been reviewed, patient examined, no change in status, stable for surgery.  I have reviewed the patient's chart and labs.  Questions were answered to the patient's satisfaction.  Discussed with her risk of bleeding, ptx, and respiratory distress.    Kathee Delton

## 2012-12-08 NOTE — H&P (View-Only) (Signed)
12/03/12- [name] former smoker with an enlarging LUL Lesion- Dr Josetta Huddle. Daughter Valerie Young here. She was treated for urinary infection in November of 2013. At that time chest x-ray showed increase in a chronic left upper lobe density. She gives history that she had been evaluated at this office some years ago. I find no old records including a review of [facility], back to about 2005, of a pulmonologist evaluation. She thinks she just had imaging, no interventional procedure. She says the doctor told her he didn't know what she had at that she was stable, to be followed long-term. She insists she has felt well with no cough, dyspnea,  sputum, nothing bloody or purulent, no chest pain or adenopathy, no weight loss. I reviewed the CT images from November 2013 back to 2005. There has been marked progression since that time of an infiltrate in the left upper lobe, starting peripherally and now extending to the left hilum. She has history of  right upper lobectomy for cancer in May of 1981 in [place], without radiation or chemotherapy. Complicating health problems have included chronic kidney disease stage III, thyroid surgery for goiter. No hx TB exposure. She is retired from office work, divorced, living alone. Quit smoking in 1981. Denies family history of lung disease. CT chest 10/09/12: IMPRESSION:  1. Enlarging airspace opacity in the left upper lobe with  prominent air bronchograms. Although this could be some type of  chronic infiltrate a slow growing adenocarcinoma needs to be  excluded. Bronchoscopic or percutaneous biopsy may be indicated.  PET CT may not be helpful in this case.  2. Stable borderline mediastinal and hilar lymph nodes.  3. Stable 6 mm pulmonary nodule in the right lower lobe.  4. Stable underlying emphysematous changes.  Original Report Authenticated By: Marijo Sanes, M.D.   Prior to Admission medications   Medication Sig Start Date End Date Taking?  Authorizing Provider  atenolol (TENORMIN) 50 MG tablet Take 1 tablet by mouth daily. 09/30/12  Yes Historical Provider, MD  cholecalciferol (VITAMIN D) 1000 UNITS tablet Take 1,000 Units by mouth daily.   Yes Historical Provider, MD  estradiol (ESTRACE) 0.5 MG tablet Take 1 tablet by mouth daily. 09/30/12  Yes Historical Provider, MD  furosemide (LASIX) 20 MG tablet Take 20 mg by mouth daily.   Yes Historical Provider, MD  levothyroxine (SYNTHROID, LEVOTHROID) 50 MCG tablet Take 1 tablet by mouth daily. 09/30/12  Yes Historical Provider, MD  lisinopril (PRINIVIL,ZESTRIL) 20 MG tablet Take 1 tablet by mouth daily. 09/30/12  Yes Historical Provider, MD   Past Medical History  Diagnosis Date  . Hypertension   . Hypothyroidism   . COPD (chronic obstructive pulmonary disease)   . DJD (degenerative joint disease)     knees  . PND (post-nasal drip)     Chronic  . H/O: lung cancer 1982    Right; treated with resection   Past Surgical History  Procedure Date  . Total knee arthroplasty     Right  . Thyroidectomy   . Vesicovaginal fistula closure w/ tah   . Right lung cancer treated with resection 1982  . Right thigh postoperative hematoma 02-2008   Family History  Problem Relation Age of Onset  . CVA Mother 92    hemorrhagic    History   Social History  . Marital Status: Divorced    Spouse Name: N/A    Number of Children: N/A  . Years of Education: N/A   Occupational History  .  Retired    Social History Main Topics  . Smoking status: Former Smoker -- 1.0 packs/day for 35 years    Types: Cigarettes    Quit date: 11/25/1980  . Smokeless tobacco: Not on file  . Alcohol Use: No  . Drug Use: No  . Sexually Active: Not on file   Other Topics Concern  . Not on file   Social History Narrative  . No narrative on file   ROS-see HPI Constitutional:   No-   weight loss, night sweats, fevers, chills, fatigue, lassitude. HEENT:   No-  headaches, difficulty swallowing, tooth/dental  problems, sore throat,       No-  sneezing, itching, ear ache, nasal congestion, post nasal drip,  CV:  No-   chest pain, orthopnea, PND, swelling in lower extremities, anasarca, dizziness, palpitations Resp: No-   shortness of breath with exertion or at rest.              No-   productive cough,  No non-productive cough,  No- coughing up of blood.              No-   change in color of mucus.  No- wheezing.   Skin: No-   rash or lesions. GI:  No-   heartburn, indigestion, abdominal pain, nausea, vomiting, diarrhea,                 change in bowel habits, loss of appetite GU: No-   dysuria, change in color of urine, no urgency or frequency.  No- flank pain. MS:  No- acute  joint pain or swelling.  No- decreased range of motion.  No- back pain. Neuro-     nothing unusual Psych:  No- change in mood or affect. No depression or anxiety.  No memory loss.  OBJ- Physical Exam General- Alert, Oriented, Affect-appropriate, Distress- none acute,  Trim, looking younger than stated age Skin- rash-none, lesions- none, excoriation- none Lymphadenopathy- none Head- atraumatic            Eyes- Gross vision intact, PERRLA, conjunctivae and secretions clear            Ears- Hearing, canals-normal            Nose- Clear, no-Septal dev, mucus, polyps, erosion, perforation             Throat- Mallampati III , mucosa clear , drainage- none, tonsils- atrophic. Dentures Neck- flexible , trachea midline, no stridor , thyroid nl, carotid no bruit Chest - symmetrical excursion , unlabored           Heart/CV- RRR , no murmur , no gallop  , no rub, nl s1 s2                           - JVD- none , edema- none, stasis changes- none, varices- none           Lung- clear to P&A, wheeze- none, cough- none , dullness-none, rub- none           Chest wall- old right thoracotomy scar Abd- tender-no, distended-no, bowel sounds-present, HSM- no Br/ Gen/ Rectal- Not done, not indicated Extrem- cyanosis- none, clubbing, none,  atrophy- none, strength- nl Neuro- grossly intact to observation

## 2012-12-08 NOTE — Op Note (Signed)
Dictation #: [id number]

## 2012-12-08 NOTE — Op Note (Signed)
Valerie Young, W[place] NO.:  [id number]  MEDICAL RECORD NO.:  [id number]  LOCATION:  RESP                         FACILITY:  [facility]  PHYSICIAN:  Kathee Delton, MD,FCCPDATE OF BIRTH:  1930/[birth year]/15  DATE OF PROCEDURE:  12/08/2012 DATE OF DISCHARGE:                              OPERATIVE REPORT   PROCEDURE:  Flexible fiberoptic bronchoscopy with video.  OPERATOR:  Kathee Delton, MD,FCCP.  INDICATION FOR PROCEDURE:  Left upper lobe infiltrate increasing in size with surveillance CT scans.  ANESTHESIA:  Versed 5 mg IV, Demerol 50 mg IV, and topical 1% lidocaine to vocal cords and airways during the procedure.  DESCRIPTION FOR PROCEDURE:  After obtaining informed consent and under close cardiopulmonary monitoring the above, preop anesthesia was given. The fiberoptic scope was passed to the right naris and into the posterior pharynx where there is no lesions or other abnormalities seen. The vocal cords appeared to be within normal limits and moved bilaterally on phonation.  The scope was then passed into the trachea where it was examined along its entire length down to the level of the carina, all of which was normal.  At the right tracheobronchial tree was examined serially.  The subsegmental level with no endobronchial abnormality being found.  Attention was then paid to the left tracheobronchial tree where the airways appeared to be widely patent with no endobronchial process to the subsegmental level.  The scope was then passed into the anterior segment of the left upper lobe where bronchoalveolar lavage, bronchial brushes, as well as transbronchial lung biopsies were done under fluoroscopic guidance, with good material being obtained.  The patient remained stable throughout the entire procedure and good hemostasis was maintained throughout.  Chest x-ray is pending at time of dictation to rule out pneumothorax post transbronchial lung biopsy.     Kathee Delton, MD,FCCP     [facility]  D:  12/08/2012  T:  12/08/2012  Job:  [id number]

## 2012-12-08 NOTE — Progress Notes (Signed)
Video Bronchoscopy Done  Bronchial washing done Bronchial Bipopsy done Bronchial brushing done  Procedure tolerated well

## 2012-12-09 ENCOUNTER — Encounter (HOSPITAL_COMMUNITY): Payer: Self-pay | Admitting: Pulmonary Disease

## 2012-12-09 ENCOUNTER — Telehealth: Payer: Self-pay | Admitting: Pulmonary Disease

## 2012-12-09 DIAGNOSIS — M545 Low back pain, unspecified: Secondary | ICD-10-CM | POA: Diagnosis not present

## 2012-12-09 DIAGNOSIS — M2550 Pain in unspecified joint: Secondary | ICD-10-CM | POA: Diagnosis not present

## 2012-12-09 DIAGNOSIS — N183 Chronic kidney disease, stage 3 unspecified: Secondary | ICD-10-CM | POA: Diagnosis not present

## 2012-12-09 NOTE — Telephone Encounter (Signed)
I spoke with pt. She is requesting results form her bronch. Advised her not all results are final. Advised will call her once [facility] advise of results. Will forward to [facility] as an FYI pt called.

## 2012-12-09 NOTE — Telephone Encounter (Signed)
I spoke with pt. Valerie Young had opening on Friday at 2:15. She is scheduled to come in at that time. Will forward to [facility] as an [place]

## 2012-12-09 NOTE — Telephone Encounter (Signed)
I need to try and get her in Friday somewhere to discuss bronch results. Can double book at 9 or 130, or can bring her in at end of day.

## 2012-12-11 ENCOUNTER — Ambulatory Visit (INDEPENDENT_AMBULATORY_CARE_PROVIDER_SITE_OTHER): Payer: Medicare Other | Admitting: Pulmonary Disease

## 2012-12-11 ENCOUNTER — Encounter: Payer: Self-pay | Admitting: Pulmonary Disease

## 2012-12-11 VITALS — BP 142/80 | HR 62 | Temp 97.1°F | Ht 64.0 in | Wt 124.8 lb

## 2012-12-11 DIAGNOSIS — C3490 Malignant neoplasm of unspecified part of unspecified bronchus or lung: Secondary | ICD-10-CM | POA: Diagnosis not present

## 2012-12-11 LAB — CULTURE, BAL-QUANTITATIVE W GRAM STAIN
Colony Count: 50000
Special Requests: NORMAL

## 2012-12-11 MED ORDER — ALPRAZOLAM 0.5 MG PO TABS: ORAL_TABLET | ORAL | Status: DC

## 2012-12-11 NOTE — Addendum Note (Signed)
Addended by: Danella Maiers on: 12/11/2012 03:47 PM   Modules accepted: Orders

## 2012-12-11 NOTE — Patient Instructions (Addendum)
Will schedule for breathing studies and also a PET scan, and will call you with results when available.

## 2012-12-11 NOTE — Progress Notes (Signed)
  Subjective:    Patient ID: Valerie Young, female    DOB: [birth year], 79 y.o.   MRN: [id number]  HPI The patient comes in today for followup after recent bronchoscopy.  Bronchial brushes were positive for adenocarcinoma involving the left upper lobe.  I have reviewed the pathology with the patient and her family, and answered all of their questions.   Review of Systems  Constitutional: Negative for fever and unexpected weight change.  HENT: Positive for congestion ( since bronch), rhinorrhea and postnasal drip. Negative for ear pain, nosebleeds, sore throat, sneezing, trouble swallowing, dental problem and sinus pressure.   Eyes: Negative for redness and itching.  Respiratory: Positive for wheezing ( since bronch). Negative for cough, chest tightness and shortness of breath.   Cardiovascular: Negative for palpitations and leg swelling.  Gastrointestinal: Negative for nausea and vomiting.  Genitourinary: Positive for frequency. Negative for dysuria.  Musculoskeletal: Negative for joint swelling.  Skin: Negative for rash.  Neurological: Negative for headaches.  Hematological: Does not bruise/bleed easily.  Psychiatric/Behavioral: Negative for dysphoric mood. The patient is not nervous/anxious.        Objective:   Physical Exam Well-developed female in no acute distress Nose without purulence or discharge noted Neck without lymphadenopathy or thyromegaly Lower extremities without edema, no cyanosis Alert and oriented, moves all 4 extremities.       Assessment & Plan:

## 2012-12-11 NOTE — Assessment & Plan Note (Signed)
The patient has been found to have adenocarcinoma involving the left upper lobe, and I have discussed the diagnosis with her and her family.  My initial thought is that she will do better with radiation and chemotherapy given her age, prior lung resection on the right, and probable underlying COPD.  However, they are interested in considering surgery if she is a good candidate.  She does have a borderline pretracheal node on her CT chest, and I would like to schedule her for a PET scan at this time.  I would also like to schedule for pulmonary function studies as well.  We'll make the appropriate referral once more information is obtained.

## 2012-12-15 ENCOUNTER — Ambulatory Visit (HOSPITAL_COMMUNITY)
Admission: RE | Admit: 2012-12-15 | Discharge: 2012-12-15 | Disposition: A | Discharge status: Home / Self Care | Payer: Medicare Other | Source: Ambulatory Visit | Attending: Pulmonary Disease | Admitting: Pulmonary Disease

## 2012-12-15 DIAGNOSIS — R222 Localized swelling, mass and lump, trunk: Secondary | ICD-10-CM | POA: Insufficient documentation

## 2012-12-15 DIAGNOSIS — C3490 Malignant neoplasm of unspecified part of unspecified bronchus or lung: Secondary | ICD-10-CM

## 2012-12-15 DIAGNOSIS — J988 Other specified respiratory disorders: Secondary | ICD-10-CM | POA: Diagnosis not present

## 2012-12-15 MED ORDER — ALBUTEROL SULFATE (5 MG/ML) 0.5% IN NEBU
2.5000 mg | INHALATION_SOLUTION | Freq: Once | RESPIRATORY_TRACT | Status: AC
  Administered 2012-12-15: 2.5 mg via RESPIRATORY_TRACT

## 2012-12-21 ENCOUNTER — Encounter (HOSPITAL_COMMUNITY)
Admission: RE | Admit: 2012-12-21 | Discharge: 2012-12-21 | Disposition: A | Discharge status: Home / Self Care | Payer: Medicare Other | Source: Ambulatory Visit | Attending: Pulmonary Disease | Admitting: Pulmonary Disease

## 2012-12-21 DIAGNOSIS — C3410 Malignant neoplasm of upper lobe, unspecified bronchus or lung: Secondary | ICD-10-CM | POA: Insufficient documentation

## 2012-12-21 DIAGNOSIS — J984 Other disorders of lung: Secondary | ICD-10-CM | POA: Diagnosis not present

## 2012-12-21 DIAGNOSIS — C3490 Malignant neoplasm of unspecified part of unspecified bronchus or lung: Secondary | ICD-10-CM

## 2012-12-21 LAB — GLUCOSE, CAPILLARY: Glucose-Capillary: 106 mg/dL — ABNORMAL HIGH (ref 70–99)

## 2012-12-21 MED ORDER — FLUDEOXYGLUCOSE F - 18 (FDG) INJECTION
18.4000 | Freq: Once | INTRAVENOUS | Status: AC | PRN
  Administered 2012-12-21: 18.4 via INTRAVENOUS

## 2012-12-22 ENCOUNTER — Encounter: Payer: Self-pay | Admitting: Pulmonary Disease

## 2012-12-23 ENCOUNTER — Other Ambulatory Visit: Payer: Self-pay | Admitting: Pulmonary Disease

## 2012-12-23 ENCOUNTER — Encounter (HOSPITAL_COMMUNITY): Payer: Medicare Other

## 2012-12-23 DIAGNOSIS — C3490 Malignant neoplasm of unspecified part of unspecified bronchus or lung: Secondary | ICD-10-CM

## 2012-12-31 ENCOUNTER — Ambulatory Visit
Admission: RE | Admit: 2012-12-31 | Discharge: 2012-12-31 | Disposition: A | Discharge status: Home / Self Care | Payer: Medicare Other | Source: Ambulatory Visit | Attending: Radiation Oncology | Admitting: Radiation Oncology

## 2012-12-31 ENCOUNTER — Ambulatory Visit: Payer: Medicare Other | Admitting: Internal Medicine

## 2012-12-31 ENCOUNTER — Encounter: Payer: Self-pay | Admitting: Radiation Oncology

## 2012-12-31 ENCOUNTER — Institutional Professional Consult (permissible substitution): Payer: Medicare Other

## 2012-12-31 ENCOUNTER — Encounter: Payer: Self-pay | Admitting: *Deleted

## 2012-12-31 ENCOUNTER — Encounter: Payer: Self-pay | Admitting: Internal Medicine

## 2012-12-31 ENCOUNTER — Encounter: Payer: Self-pay | Admitting: Thoracic Surgery (Cardiothoracic Vascular Surgery)

## 2012-12-31 VITALS — BP 117/60 | HR 72 | Temp 97.1°F | Resp 18 | Ht 64.0 in | Wt 124.0 lb

## 2012-12-31 DIAGNOSIS — Z85118 Personal history of other malignant neoplasm of bronchus and lung: Secondary | ICD-10-CM

## 2012-12-31 DIAGNOSIS — C3490 Malignant neoplasm of unspecified part of unspecified bronchus or lung: Secondary | ICD-10-CM

## 2012-12-31 DIAGNOSIS — C3410 Malignant neoplasm of upper lobe, unspecified bronchus or lung: Secondary | ICD-10-CM | POA: Diagnosis not present

## 2012-12-31 NOTE — Progress Notes (Signed)
Gave pt appt for SIM at [facility].  She verbalized understanding of time and place of appt

## 2012-12-31 NOTE — Progress Notes (Signed)
Spoke with pt and daughter at [facility] today.  Educational/resource information given and explained.  Distress and nutrition screening completed.  Questions and concerns addressed

## 2012-12-31 NOTE — Progress Notes (Signed)
Radiation Oncology         [phone] ________________________________  [name] Initial outpatient Consultation  Name: Valerie Young MRN: [id number]  Date: 12/31/2012  DOB: Jun 16, 1929  [id number] NEVILL, MD  Josetta Huddle, MD   REFERRING PHYSICIAN: Josetta Huddle, MD  DIAGNOSIS: 79 yo woman with adenocarcinoma of the left upper lung  HISTORY OF PRESENT ILLNESS::Valerie Young is a 79 y.o. female former smoker with an history of right upper lobectomy for cancer in May of 1981 in [place], without radiation or chemotherapy.She was treated for urinary infection in November of 2013. At that time chest x-ray showed increase in a chronic left upper lobe density. CT images from November 2013 showed marked progression since  2005 of an infiltrate in the left upper lobe, starting peripherally and now extending to the left hilum.  Bronch on 1/14 revealed adenocarcinoma.  PET CT on 1/27 showed malignant level activity in the left upper lung.  A borderline mediastinal node and contralateral pulmonary nodule were negative.  Her films and biopsy were presented at our multidisciplinary conference earlier today, and she presents this afternoon to the [facility] clinic to coordinate further treatment.  PREVIOUS RADIATION THERAPY: No  PAST MEDICAL HISTORY:  has a past medical history of Hypertension; Hypothyroidism; COPD (chronic obstructive pulmonary disease); DJD (degenerative joint disease); PND (post-nasal drip); and H/O: lung cancer (1982).    PAST SURGICAL HISTORY: Past Surgical History  Procedure Date  . Total knee arthroplasty     Right  . Thyroidectomy   . Vesicovaginal fistula closure w/ tah   . Right lung cancer treated with resection 1982  . Right thigh postoperative hematoma 02-2008  . Video bronchoscopy 12/08/2012    Procedure: VIDEO BRONCHOSCOPY WITH FLUORO;  Surgeon: Kathee Delton, MD;  Location: [facility];  Service: Cardiopulmonary;  Laterality: Bilateral;    FAMILY HISTORY:  family history includes CVA (age of onset:49) in her mother.  SOCIAL HISTORY:  reports that she quit smoking about 32 years ago. Her smoking use included Cigarettes. She has a 35 pack-year smoking history. She does not have any smokeless tobacco history on file. She reports that she does not drink alcohol or use illicit drugs.  ALLERGIES: Codeine and Sulfamethoxazole w-trimethoprim  MEDICATIONS:  Current Outpatient Prescriptions  Medication Sig Dispense Refill  . ALPRAZolam (XANAX) 0.5 MG tablet Take 1 tablet every 12 hours as needed for anxiety.  20 tablet  0  . amLODipine (NORVASC) 5 MG tablet Take 5 mg by mouth at bedtime.      [name] atenolol (TENORMIN) 50 MG tablet Take 1 tablet by mouth daily.      . cholecalciferol (VITAMIN D) 1000 UNITS tablet Take 1,000 Units by mouth daily.      [name] estradiol (ESTRACE) 0.5 MG tablet Take 1 tablet by mouth daily.      . furosemide (LASIX) 20 MG tablet Take 20 mg by mouth daily.      [name] levothyroxine (SYNTHROID, LEVOTHROID) 50 MCG tablet Take 1 tablet by mouth daily.      [name] lisinopril (PRINIVIL,ZESTRIL) 20 MG tablet Take 1 tablet by mouth daily.        REVIEW OF SYSTEMS:  A 15 point review of systems is documented in the electronic medical record. This was obtained by the nursing staff. However, I reviewed this with the patient to discuss relevant findings and make appropriate changes.  A comprehensive review of systems was negative.   PHYSICAL EXAM: The patient is a pleasant elderly woman in  no acute distress, per pulmonary, General- Alert, Oriented, Affect-appropriate, Distress- none acute, Trim, looking younger than stated age  Skin- rash-none, lesions- none, excoriation- none  Lymphadenopathy- none  Head- atraumatic  Eyes- Gross vision intact, PERRLA, conjunctivae and secretions clear  Ears- Hearing, canals-normal  Nose- Clear, no-Septal dev, mucus, polyps, erosion, perforation  Throat- Mallampati III , mucosa clear , drainage- none, tonsils-  atrophic. Dentures  Neck- flexible , trachea midline, no stridor , thyroid nl, carotid no bruit  Chest - symmetrical excursion , unlabored  Heart/CV- RRR , no murmur , no gallop , no rub, nl s1 s2  - JVD- none , edema- none, stasis changes- none, varices- none  Lung- clear to P&A, wheeze- none, cough- none , dullness-none, rub- none  Chest wall- old right thoracotomy scar  Abd- tender-no, distended-no, bowel sounds-present, HSM- no  Br/ Gen/ Rectal- Not done, not indicated  Extrem- cyanosis- none, clubbing, none, atrophy- none, strength- nl  Neuro- grossly intact to observation   LABORATORY DATA:  Lab Results  Component Value Date   WBC 9.5 12/03/2012   HGB 12.5 12/03/2012   HCT 37.4 12/03/2012   MCV 96.2 12/03/2012   PLT 245.0 12/03/2012   Lab Results  Component Value Date   NA 138 12/03/2012   K 3.6 12/03/2012   CL 102 12/03/2012   CO2 28 12/03/2012   Lab Results  Component Value Date   ALT 13 12/03/2012   AST 21 12/03/2012   ALKPHOS 60 12/03/2012   BILITOT 0.6 12/03/2012     RADIOGRAPHY: Dg Chest 2 View  12/03/2012  *RADIOLOGY REPORT*  Clinical Data: Follow up of left upper lung lesion  CHEST - 2 VIEW  Comparison: Chest x-ray of 10/20/2012 and CT chest of 10/13/2012 and 06/28/2008  Findings: The airspace opacity within the left upper lobe has not changed significantly.  The persistence of this opacity is worrisome for malignancy such as a slow growing low grade adenocarcinoma.  The lungs are hyperaerated consistent with emphysema.  Surgical clips remain over the right suprahilar region. Mediastinal contours are stable.  The heart is within normal limits in size.  No bony abnormality is seen.  IMPRESSION:  1.  Persistent airspace opacity within the left upper lobe suspicious for low grade adenocarcinoma. 2.  Emphysema.   Original Report Authenticated By: Ivar Drape, M.D.    Nm Pet Image Initial (pi) Skull Base To Thigh  12/21/2012  *RADIOLOGY REPORT*  Clinical Data: Initial treatment strategy for left  upper lobe lung adenocarcinoma. Personal history of right lung carcinoma.  NUCLEAR MEDICINE PET SKULL BASE TO THIGH  Fasting Blood Glucose:  106  Technique:  18.4 mCi F-18 FDG was injected intravenously. CT data was obtained and used for attenuation correction and anatomic localization only.  (This was not acquired as a diagnostic CT examination.) Additional exam technical data entered on technologist worksheet.  Comparison:  Noncontrast chest CT on 10/13/2012  Findings:  Neck: No hypermetabolic lymph nodes in the neck.  Chest:  No hypermetabolic mediastinal or hilar nodes. Heterogeneous air space disease involving the majority of the left upper lobe shows diffuse hypermetabolic activity, with a maximum SUV of 11.3. No hypermetabolic pulmonary nodules are identified.  A tiny 5 mm right midlung nodular density remains stable.  Abdomen/Pelvis:  No abnormal hypermetabolic activity within the liver, pancreas, adrenal glands, or spleen.  No hypermetabolic lymph nodes in the abdomen or pelvis.  Skeleton:  No focal hypermetabolic activity to suggest skeletal metastasis.  IMPRESSION:  1.  Persistent  ill-defined hypermetabolic left upper lobe airspace disease, consistent with known adenocarcinoma. 2.  No evidence of hypermetabolic thoracic lymphadenopathy or distant metastatic disease.   Original Report Authenticated By: Earle Gell, M.D.    Dg Chest Port 1 View  12/08/2012  *RADIOLOGY REPORT*  Clinical Data: Post bronchoscopy.  PORTABLE CHEST - 1 VIEW  Comparison: 12/03/2012  Findings: Increasing left upper lobe airspace opacity, likely related to bronchoscopy.  Question tiny left apical pneumothorax. Postoperative changes on the right.  Scarring in the right lung base.  Heart is normal size.  No effusions.  IMPRESSION: Increasing airspace disease in the left upper lobe post bronchoscopy.  Question tiny left apical pneumothorax.   Original Report Authenticated By: Rolm Baptise, M.D.    Dg C-arm Bronchoscopy  12/08/2012   CLINICAL DATA: S/P bronch R/O ptx   C-ARM BRONCHOSCOPY  Fluoroscopy was utilized by the requesting physician.  No radiographic  interpretation.       IMPRESSION: This patient is a very nice 79 yo woman with adenocarcinoma of the left upper lung.  Based on PET-CT, she appears to have stage IB disease.  She had right upper lobectomy in the remote past.  Because of her advanced age and previous lobectomy, she is not an ideal surgical candidate.  She would be eligible for definitive radiation.  She is not ideal for concurrent chemo.  She discussed this with Dr. Julien Nordmann today.  PLAN: Today, I talked to the patient and family about the findings and work-up thus far.  We discussed the natural history of disease and general treatment, highlighting the role or radiotherapy in the management.  We discussed the available radiation techniques, and focused on the details of logistics and delivery.  We reviewed the anticipated acute and late sequelae associated with radiation in this setting.  The patient was encouraged to ask questions that I answered to the best of my ability.  I filled out a patient counseling form during our discussion including treatment diagrams.  We retained a copy for our records.  The patient would like to proceed with radiation and will be scheduled for CT simulation on 2/13.  I spent 60 minutes minutes face to face with the patient and more than 50% of that time was spent in counseling and/or coordination of care.    ------------------------------------------------  Sheral Apley. Tammi Klippel, M.D.

## 2012-12-31 NOTE — Progress Notes (Signed)
The patient is seen only by radiation oncology.

## 2012-12-31 NOTE — Progress Notes (Unsigned)
This encounter was created in error - please disregard.

## 2013-01-05 ENCOUNTER — Telehealth: Payer: Self-pay | Admitting: Pulmonary Disease

## 2013-01-06 LAB — FUNGUS CULTURE W SMEAR

## 2013-01-06 NOTE — Telephone Encounter (Signed)
Patients pharmacy is requesting refill of ALPRAZolam (XANAX) 0.5 MG. Last filled 12/11/12   ALPRAZolam (XANAX) 0.5 MG tablet Take 1 tablet every 12 hours as needed for anxiety. #20  Zero refills  Dr Gwenette Greet please advise if Okay to refill? Thanks.

## 2013-01-06 NOTE — Telephone Encounter (Signed)
This was a one time prescription to get her thru the time of her diagnosis. She really needs to discuss this with her primary md.

## 2013-01-06 NOTE — Telephone Encounter (Signed)
Patient states that she never called her pharmacy to have this refilled and is unaware of reason this was sent for refill. rx has been denied and patient aware. Pt states that she does not need this medication any longer. Nothing further needed.

## 2013-01-07 ENCOUNTER — Telehealth: Payer: Self-pay | Admitting: *Deleted

## 2013-01-07 ENCOUNTER — Ambulatory Visit: Payer: Medicare Other | Admitting: Radiation Oncology

## 2013-01-07 ENCOUNTER — Other Ambulatory Visit: Payer: Medicare Other | Admitting: Radiation Oncology

## 2013-01-07 ENCOUNTER — Ambulatory Visit: Admission: RE | Admit: 2013-01-07 | Payer: Medicare Other | Source: Ambulatory Visit | Admitting: Radiation Oncology

## 2013-01-07 NOTE — Telephone Encounter (Signed)
done

## 2013-01-13 ENCOUNTER — Telehealth: Payer: Self-pay | Admitting: Radiation Oncology

## 2013-01-13 ENCOUNTER — Other Ambulatory Visit: Payer: Self-pay | Admitting: Radiation Oncology

## 2013-01-13 ENCOUNTER — Ambulatory Visit
Admission: RE | Admit: 2013-01-13 | Discharge: 2013-01-13 | Disposition: A | Discharge status: Home / Self Care | Payer: Medicare Other | Source: Ambulatory Visit | Attending: Radiation Oncology | Admitting: Radiation Oncology

## 2013-01-13 DIAGNOSIS — K209 Esophagitis, unspecified without bleeding: Secondary | ICD-10-CM | POA: Diagnosis not present

## 2013-01-13 DIAGNOSIS — Z510 Encounter for antineoplastic radiation therapy: Secondary | ICD-10-CM | POA: Diagnosis not present

## 2013-01-13 DIAGNOSIS — Y842 Radiological procedure and radiotherapy as the cause of abnormal reaction of the patient, or of later complication, without mention of misadventure at the time of the procedure: Secondary | ICD-10-CM | POA: Insufficient documentation

## 2013-01-13 DIAGNOSIS — C3410 Malignant neoplasm of upper lobe, unspecified bronchus or lung: Secondary | ICD-10-CM | POA: Diagnosis not present

## 2013-01-13 DIAGNOSIS — L259 Unspecified contact dermatitis, unspecified cause: Secondary | ICD-10-CM | POA: Diagnosis not present

## 2013-01-13 DIAGNOSIS — M1710 Unilateral primary osteoarthritis, unspecified knee: Secondary | ICD-10-CM | POA: Diagnosis not present

## 2013-01-13 DIAGNOSIS — Z79899 Other long term (current) drug therapy: Secondary | ICD-10-CM | POA: Diagnosis not present

## 2013-01-13 NOTE — Telephone Encounter (Signed)
Met w patient to discuss RO billing. Pt had no financial concerns today.  Dx: Adenocarcinoma of lung - Primary 162.9  Attending Rad: MM  Rad Tx:

## 2013-01-13 NOTE — Progress Notes (Signed)
  Radiation Oncology         [phone] ________________________________  Name: Valerie Young MRN: [id number]  Date: 01/13/2013  DOB: August 08, 1929  RESPIRATORY MOTION MANAGEMENT  SIMULATION AND TREATMENT PLANNING NOTE  DIAGNOSIS:  79 yo woman with adenocarcinoma of the left upper lung  NARRATIVE:  The patient was brought to the [facility].  Identity was confirmed.  All relevant records and images related to the planned course of therapy were reviewed.  The patient freely provided informed written consent to proceed with treatment after reviewing the details related to the planned course of therapy. The consent form was witnessed and verified by the simulation staff.  Then, the patient was set-up in a stable reproducible  supine position for radiation therapy.  A BodyFix immobilization pillow was fabricated for reproducible positioning.  Then I personally applied the abdominal compression paddle to limit respiratory excursion.  4D respiratoy motion management CT images were obtained in order to account for effect of respiratory motion on target structures and other organs in the planning and delivery of radiotherapy The patient underwent respiratory motion management simulation.   The CT images were loaded into the planning software.  Then, using a variety of tools including Cine, MIP, and standard views, the target volume and planning target volumes (PTV) were delineated.  Surface markings were placed.  Treatment planning then occurred.  The radiation prescription was entered and confirmed.  A total of two complex treatment devices were fabricated in the form of the BodyFix immobilization pillow and a neck accuform cushion.  I have requested : 3D Simulation  I have requested a DVH of the following structures: Heart, Lungs, Esophagus, Chest Wall, Brachial Plexus, Major Blood Vessels, and targets.  PLAN:  The patient will receive 66 Gy in 30  fractions.  ________________________________  Sheral Apley Tammi Klippel, M.D.

## 2013-01-14 NOTE — Telephone Encounter (Signed)
Lung Cancer

## 2013-01-15 ENCOUNTER — Other Ambulatory Visit: Payer: Self-pay | Admitting: Pulmonary Disease

## 2013-01-20 ENCOUNTER — Ambulatory Visit
Admission: RE | Admit: 2013-01-20 | Discharge: 2013-01-20 | Disposition: A | Discharge status: Home / Self Care | Payer: Medicare Other | Source: Ambulatory Visit | Attending: Radiation Oncology | Admitting: Radiation Oncology

## 2013-01-20 DIAGNOSIS — Z510 Encounter for antineoplastic radiation therapy: Secondary | ICD-10-CM | POA: Diagnosis not present

## 2013-01-20 DIAGNOSIS — L259 Unspecified contact dermatitis, unspecified cause: Secondary | ICD-10-CM | POA: Diagnosis not present

## 2013-01-20 DIAGNOSIS — C3410 Malignant neoplasm of upper lobe, unspecified bronchus or lung: Secondary | ICD-10-CM | POA: Diagnosis not present

## 2013-01-20 DIAGNOSIS — M1710 Unilateral primary osteoarthritis, unspecified knee: Secondary | ICD-10-CM | POA: Diagnosis not present

## 2013-01-20 DIAGNOSIS — Z79899 Other long term (current) drug therapy: Secondary | ICD-10-CM | POA: Diagnosis not present

## 2013-01-20 DIAGNOSIS — K209 Esophagitis, unspecified without bleeding: Secondary | ICD-10-CM | POA: Diagnosis not present

## 2013-01-20 NOTE — Progress Notes (Signed)
  Radiation Oncology         [phone] ________________________________  Name: Valerie Young MRN: [id number]  Date: 01/20/2013  DOB: 1929-08-24  Simulation Verification Note  Status: outpatient  NARRATIVE: The patient was brought to the treatment unit and placed in the planned treatment position. The clinical setup was verified. Then port films were obtained and uploaded to the radiation oncology medical record software.  The treatment beams were carefully compared against the planned radiation fields. The position location and shape of the radiation fields was reviewed. They targeted volume of tissue appears to be appropriately covered by the radiation beams. Organs at risk appear to be excluded as planned.  Based on my personal review, I approved the simulation verification. The patient's treatment will proceed as planned.  ------------------------------------------------  Sheral Apley Tammi Klippel, M.D.

## 2013-01-21 ENCOUNTER — Ambulatory Visit: Payer: Medicare Other

## 2013-01-21 ENCOUNTER — Ambulatory Visit
Admission: RE | Admit: 2013-01-21 | Discharge: 2013-01-21 | Disposition: A | Discharge status: Home / Self Care | Payer: Medicare Other | Source: Ambulatory Visit | Attending: Radiation Oncology | Admitting: Radiation Oncology

## 2013-01-21 DIAGNOSIS — Z510 Encounter for antineoplastic radiation therapy: Secondary | ICD-10-CM | POA: Diagnosis not present

## 2013-01-21 DIAGNOSIS — K209 Esophagitis, unspecified without bleeding: Secondary | ICD-10-CM | POA: Diagnosis not present

## 2013-01-21 DIAGNOSIS — Z79899 Other long term (current) drug therapy: Secondary | ICD-10-CM | POA: Diagnosis not present

## 2013-01-21 DIAGNOSIS — M1710 Unilateral primary osteoarthritis, unspecified knee: Secondary | ICD-10-CM | POA: Diagnosis not present

## 2013-01-21 DIAGNOSIS — C3410 Malignant neoplasm of upper lobe, unspecified bronchus or lung: Secondary | ICD-10-CM | POA: Diagnosis not present

## 2013-01-21 DIAGNOSIS — L259 Unspecified contact dermatitis, unspecified cause: Secondary | ICD-10-CM | POA: Diagnosis not present

## 2013-01-21 LAB — AFB CULTURE WITH SMEAR (NOT AT ARMC)

## 2013-01-21 MED ORDER — RADIAPLEXRX EX GEL
Freq: Once | CUTANEOUS | Status: AC
  Administered 2013-01-21: 15:00:00 via TOPICAL

## 2013-01-21 NOTE — Progress Notes (Signed)
Patient education given , my business card,calendar,radiation therapy and you book, radiaplex gel, when to apply skin cream to affected area bid, after rad tx and either bedtime ior in am after bath, not 4 hours prior to rad tx,teach back,disccussed pain, when to see MD weekly and prn 2:27 PM

## 2013-01-22 ENCOUNTER — Ambulatory Visit
Admission: RE | Admit: 2013-01-22 | Discharge: 2013-01-22 | Disposition: A | Discharge status: Home / Self Care | Payer: Medicare Other | Source: Ambulatory Visit | Attending: Radiation Oncology | Admitting: Radiation Oncology

## 2013-01-22 DIAGNOSIS — L259 Unspecified contact dermatitis, unspecified cause: Secondary | ICD-10-CM | POA: Diagnosis not present

## 2013-01-22 DIAGNOSIS — Z1589 Genetic susceptibility to other disease: Secondary | ICD-10-CM | POA: Diagnosis not present

## 2013-01-22 DIAGNOSIS — M1710 Unilateral primary osteoarthritis, unspecified knee: Secondary | ICD-10-CM | POA: Diagnosis not present

## 2013-01-22 DIAGNOSIS — C3490 Malignant neoplasm of unspecified part of unspecified bronchus or lung: Secondary | ICD-10-CM | POA: Diagnosis not present

## 2013-01-22 DIAGNOSIS — Z510 Encounter for antineoplastic radiation therapy: Secondary | ICD-10-CM | POA: Diagnosis not present

## 2013-01-22 DIAGNOSIS — Z79899 Other long term (current) drug therapy: Secondary | ICD-10-CM | POA: Diagnosis not present

## 2013-01-22 DIAGNOSIS — R7989 Other specified abnormal findings of blood chemistry: Secondary | ICD-10-CM | POA: Diagnosis not present

## 2013-01-22 DIAGNOSIS — M25569 Pain in unspecified knee: Secondary | ICD-10-CM | POA: Diagnosis not present

## 2013-01-22 DIAGNOSIS — C3410 Malignant neoplasm of upper lobe, unspecified bronchus or lung: Secondary | ICD-10-CM | POA: Diagnosis not present

## 2013-01-22 DIAGNOSIS — K209 Esophagitis, unspecified without bleeding: Secondary | ICD-10-CM | POA: Diagnosis not present

## 2013-01-25 ENCOUNTER — Ambulatory Visit
Admission: RE | Admit: 2013-01-25 | Discharge: 2013-01-25 | Disposition: A | Discharge status: Home / Self Care | Payer: Medicare Other | Source: Ambulatory Visit | Attending: Radiation Oncology | Admitting: Radiation Oncology

## 2013-01-25 DIAGNOSIS — L259 Unspecified contact dermatitis, unspecified cause: Secondary | ICD-10-CM | POA: Diagnosis not present

## 2013-01-25 DIAGNOSIS — Z510 Encounter for antineoplastic radiation therapy: Secondary | ICD-10-CM | POA: Diagnosis not present

## 2013-01-25 DIAGNOSIS — K209 Esophagitis, unspecified without bleeding: Secondary | ICD-10-CM | POA: Diagnosis not present

## 2013-01-25 DIAGNOSIS — Z79899 Other long term (current) drug therapy: Secondary | ICD-10-CM | POA: Diagnosis not present

## 2013-01-25 DIAGNOSIS — C3410 Malignant neoplasm of upper lobe, unspecified bronchus or lung: Secondary | ICD-10-CM | POA: Diagnosis not present

## 2013-01-25 DIAGNOSIS — M1710 Unilateral primary osteoarthritis, unspecified knee: Secondary | ICD-10-CM | POA: Diagnosis not present

## 2013-01-26 ENCOUNTER — Ambulatory Visit
Admission: RE | Admit: 2013-01-26 | Discharge: 2013-01-26 | Disposition: A | Discharge status: Home / Self Care | Payer: Medicare Other | Source: Ambulatory Visit | Attending: Radiation Oncology | Admitting: Radiation Oncology

## 2013-01-26 DIAGNOSIS — K209 Esophagitis, unspecified without bleeding: Secondary | ICD-10-CM | POA: Diagnosis not present

## 2013-01-26 DIAGNOSIS — C3410 Malignant neoplasm of upper lobe, unspecified bronchus or lung: Secondary | ICD-10-CM | POA: Diagnosis not present

## 2013-01-26 DIAGNOSIS — Z510 Encounter for antineoplastic radiation therapy: Secondary | ICD-10-CM | POA: Diagnosis not present

## 2013-01-26 DIAGNOSIS — Z79899 Other long term (current) drug therapy: Secondary | ICD-10-CM | POA: Diagnosis not present

## 2013-01-26 DIAGNOSIS — M1710 Unilateral primary osteoarthritis, unspecified knee: Secondary | ICD-10-CM | POA: Diagnosis not present

## 2013-01-26 DIAGNOSIS — L259 Unspecified contact dermatitis, unspecified cause: Secondary | ICD-10-CM | POA: Diagnosis not present

## 2013-01-27 ENCOUNTER — Ambulatory Visit
Admission: RE | Admit: 2013-01-27 | Discharge: 2013-01-27 | Disposition: A | Discharge status: Home / Self Care | Payer: Medicare Other | Source: Ambulatory Visit | Attending: Radiation Oncology | Admitting: Radiation Oncology

## 2013-01-27 DIAGNOSIS — C3410 Malignant neoplasm of upper lobe, unspecified bronchus or lung: Secondary | ICD-10-CM | POA: Diagnosis not present

## 2013-01-27 DIAGNOSIS — L259 Unspecified contact dermatitis, unspecified cause: Secondary | ICD-10-CM | POA: Diagnosis not present

## 2013-01-27 DIAGNOSIS — M1710 Unilateral primary osteoarthritis, unspecified knee: Secondary | ICD-10-CM | POA: Diagnosis not present

## 2013-01-27 DIAGNOSIS — Z79899 Other long term (current) drug therapy: Secondary | ICD-10-CM | POA: Diagnosis not present

## 2013-01-27 DIAGNOSIS — K209 Esophagitis, unspecified without bleeding: Secondary | ICD-10-CM | POA: Diagnosis not present

## 2013-01-27 DIAGNOSIS — Z510 Encounter for antineoplastic radiation therapy: Secondary | ICD-10-CM | POA: Diagnosis not present

## 2013-01-27 NOTE — Progress Notes (Signed)
Patient here weekly rad tx  Left lung, 5/30 completed, alert,oriented x3, no c/o pain, no sob,no nausea, eating well, drinking enough water,  Sinus drainage only stated pateint, 96% room air started on norvac=[place] 5mg  o newest med Vitals wnl 1:44 PM

## 2013-01-28 ENCOUNTER — Encounter: Payer: Self-pay | Admitting: Radiation Oncology

## 2013-01-28 ENCOUNTER — Ambulatory Visit
Admission: RE | Admit: 2013-01-28 | Discharge: 2013-01-28 | Disposition: A | Discharge status: Home / Self Care | Payer: Medicare Other | Source: Ambulatory Visit | Attending: Radiation Oncology | Admitting: Radiation Oncology

## 2013-01-28 DIAGNOSIS — L259 Unspecified contact dermatitis, unspecified cause: Secondary | ICD-10-CM | POA: Diagnosis not present

## 2013-01-28 DIAGNOSIS — C3410 Malignant neoplasm of upper lobe, unspecified bronchus or lung: Secondary | ICD-10-CM | POA: Diagnosis not present

## 2013-01-28 DIAGNOSIS — M1710 Unilateral primary osteoarthritis, unspecified knee: Secondary | ICD-10-CM | POA: Diagnosis not present

## 2013-01-28 DIAGNOSIS — Z79899 Other long term (current) drug therapy: Secondary | ICD-10-CM | POA: Diagnosis not present

## 2013-01-28 DIAGNOSIS — K209 Esophagitis, unspecified without bleeding: Secondary | ICD-10-CM | POA: Diagnosis not present

## 2013-01-28 DIAGNOSIS — Z510 Encounter for antineoplastic radiation therapy: Secondary | ICD-10-CM | POA: Diagnosis not present

## 2013-01-28 NOTE — Progress Notes (Signed)
  Radiation Oncology         [phone] ________________________________  Name: Valerie Young MRN: [id number]  Date: 01/27/2013  DOB: [birth year]  Weekly Radiation Therapy Management  Current Dose: 8.8 Gy     Planned Dose:  63.8 Gy  Narrative . . . . . . . . The patient presents for routine under treatment assessment.                                              The patient is without complaint.Patient here weekly rad tx Left lung, 5/30 completed, alert,oriented x3, no c/o pain, no sob,no nausea, eating well, drinking enough water, Sinus drainage only stated pateint, 96% room air started on norvac=[place] 5mg  o newest med                                 Set-up films were reviewed.                                 The chart was checked. Physical Findings. . .  weight is 123 lb 12.8 oz (56.155 kg). Her oral temperature is 97.6 F (36.4 C). Her blood pressure is 119/60 and her pulse is 60. Her respiration is 20 and oxygen saturation is 96%. . Weight essentially stable.  No significant changes. Impression . . . . . . . The patient is  tolerating radiation. Plan . . . . . . . . . . . . Continue treatment as planned.  ________________________________  Sheral Apley. Tammi Klippel, M.D.

## 2013-01-29 ENCOUNTER — Ambulatory Visit: Payer: Medicare Other

## 2013-02-01 ENCOUNTER — Ambulatory Visit
Admission: RE | Admit: 2013-02-01 | Discharge: 2013-02-01 | Disposition: A | Discharge status: Home / Self Care | Payer: Medicare Other | Source: Ambulatory Visit | Attending: Radiation Oncology | Admitting: Radiation Oncology

## 2013-02-01 DIAGNOSIS — L259 Unspecified contact dermatitis, unspecified cause: Secondary | ICD-10-CM | POA: Diagnosis not present

## 2013-02-01 DIAGNOSIS — I10 Essential (primary) hypertension: Secondary | ICD-10-CM | POA: Diagnosis not present

## 2013-02-01 DIAGNOSIS — M1710 Unilateral primary osteoarthritis, unspecified knee: Secondary | ICD-10-CM | POA: Diagnosis not present

## 2013-02-01 DIAGNOSIS — I129 Hypertensive chronic kidney disease with stage 1 through stage 4 chronic kidney disease, or unspecified chronic kidney disease: Secondary | ICD-10-CM | POA: Diagnosis not present

## 2013-02-01 DIAGNOSIS — Z79899 Other long term (current) drug therapy: Secondary | ICD-10-CM | POA: Diagnosis not present

## 2013-02-01 DIAGNOSIS — C3410 Malignant neoplasm of upper lobe, unspecified bronchus or lung: Secondary | ICD-10-CM | POA: Diagnosis not present

## 2013-02-01 DIAGNOSIS — Z510 Encounter for antineoplastic radiation therapy: Secondary | ICD-10-CM | POA: Diagnosis not present

## 2013-02-01 DIAGNOSIS — K209 Esophagitis, unspecified without bleeding: Secondary | ICD-10-CM | POA: Diagnosis not present

## 2013-02-01 DIAGNOSIS — N183 Chronic kidney disease, stage 3 unspecified: Secondary | ICD-10-CM | POA: Diagnosis not present

## 2013-02-02 ENCOUNTER — Ambulatory Visit
Admission: RE | Admit: 2013-02-02 | Discharge: 2013-02-02 | Disposition: A | Discharge status: Home / Self Care | Payer: Medicare Other | Source: Ambulatory Visit | Attending: Radiation Oncology | Admitting: Radiation Oncology

## 2013-02-02 DIAGNOSIS — Z510 Encounter for antineoplastic radiation therapy: Secondary | ICD-10-CM | POA: Diagnosis not present

## 2013-02-02 DIAGNOSIS — Z79899 Other long term (current) drug therapy: Secondary | ICD-10-CM | POA: Diagnosis not present

## 2013-02-02 DIAGNOSIS — L259 Unspecified contact dermatitis, unspecified cause: Secondary | ICD-10-CM | POA: Diagnosis not present

## 2013-02-02 DIAGNOSIS — C3410 Malignant neoplasm of upper lobe, unspecified bronchus or lung: Secondary | ICD-10-CM | POA: Diagnosis not present

## 2013-02-02 DIAGNOSIS — K209 Esophagitis, unspecified without bleeding: Secondary | ICD-10-CM | POA: Diagnosis not present

## 2013-02-02 DIAGNOSIS — M1710 Unilateral primary osteoarthritis, unspecified knee: Secondary | ICD-10-CM | POA: Diagnosis not present

## 2013-02-03 ENCOUNTER — Ambulatory Visit
Admission: RE | Admit: 2013-02-03 | Discharge: 2013-02-03 | Disposition: A | Discharge status: Home / Self Care | Payer: Medicare Other | Source: Ambulatory Visit | Attending: Radiation Oncology | Admitting: Radiation Oncology

## 2013-02-03 DIAGNOSIS — M1710 Unilateral primary osteoarthritis, unspecified knee: Secondary | ICD-10-CM | POA: Diagnosis not present

## 2013-02-03 DIAGNOSIS — L259 Unspecified contact dermatitis, unspecified cause: Secondary | ICD-10-CM | POA: Diagnosis not present

## 2013-02-03 DIAGNOSIS — Z510 Encounter for antineoplastic radiation therapy: Secondary | ICD-10-CM | POA: Diagnosis not present

## 2013-02-03 DIAGNOSIS — C3410 Malignant neoplasm of upper lobe, unspecified bronchus or lung: Secondary | ICD-10-CM | POA: Diagnosis not present

## 2013-02-03 DIAGNOSIS — K209 Esophagitis, unspecified without bleeding: Secondary | ICD-10-CM | POA: Diagnosis not present

## 2013-02-03 DIAGNOSIS — Z79899 Other long term (current) drug therapy: Secondary | ICD-10-CM | POA: Diagnosis not present

## 2013-02-04 ENCOUNTER — Ambulatory Visit
Admission: RE | Admit: 2013-02-04 | Discharge: 2013-02-04 | Disposition: A | Discharge status: Home / Self Care | Payer: Medicare Other | Source: Ambulatory Visit | Attending: Radiation Oncology | Admitting: Radiation Oncology

## 2013-02-04 ENCOUNTER — Encounter: Payer: Self-pay | Admitting: Radiation Oncology

## 2013-02-04 DIAGNOSIS — Z510 Encounter for antineoplastic radiation therapy: Secondary | ICD-10-CM | POA: Diagnosis not present

## 2013-02-04 DIAGNOSIS — K209 Esophagitis, unspecified without bleeding: Secondary | ICD-10-CM | POA: Diagnosis not present

## 2013-02-04 DIAGNOSIS — C3410 Malignant neoplasm of upper lobe, unspecified bronchus or lung: Secondary | ICD-10-CM | POA: Diagnosis not present

## 2013-02-04 DIAGNOSIS — Z79899 Other long term (current) drug therapy: Secondary | ICD-10-CM | POA: Diagnosis not present

## 2013-02-04 DIAGNOSIS — L259 Unspecified contact dermatitis, unspecified cause: Secondary | ICD-10-CM | POA: Diagnosis not present

## 2013-02-04 DIAGNOSIS — M1710 Unilateral primary osteoarthritis, unspecified knee: Secondary | ICD-10-CM | POA: Diagnosis not present

## 2013-02-04 NOTE — Progress Notes (Signed)
Pt denies pain except knee pain which is arthritis-related. She takes Acetaminophen twice daily for pain control. She denies cough, SOB, fatigue, loss of appetite.

## 2013-02-05 ENCOUNTER — Ambulatory Visit
Admission: RE | Admit: 2013-02-05 | Discharge: 2013-02-05 | Disposition: A | Discharge status: Home / Self Care | Payer: Medicare Other | Source: Ambulatory Visit | Attending: Radiation Oncology | Admitting: Radiation Oncology

## 2013-02-05 ENCOUNTER — Encounter: Payer: Self-pay | Admitting: Radiation Oncology

## 2013-02-05 DIAGNOSIS — L259 Unspecified contact dermatitis, unspecified cause: Secondary | ICD-10-CM | POA: Diagnosis not present

## 2013-02-05 DIAGNOSIS — M1710 Unilateral primary osteoarthritis, unspecified knee: Secondary | ICD-10-CM | POA: Diagnosis not present

## 2013-02-05 DIAGNOSIS — Z79899 Other long term (current) drug therapy: Secondary | ICD-10-CM | POA: Diagnosis not present

## 2013-02-05 DIAGNOSIS — C3410 Malignant neoplasm of upper lobe, unspecified bronchus or lung: Secondary | ICD-10-CM | POA: Diagnosis not present

## 2013-02-05 DIAGNOSIS — Z510 Encounter for antineoplastic radiation therapy: Secondary | ICD-10-CM | POA: Diagnosis not present

## 2013-02-05 DIAGNOSIS — K209 Esophagitis, unspecified without bleeding: Secondary | ICD-10-CM | POA: Diagnosis not present

## 2013-02-05 NOTE — Progress Notes (Signed)
  Radiation Oncology         [phone] ________________________________  Name: Valerie Young MRN: [id number]  Date: 02/04/2013  DOB: January 17, 1929  Weekly Radiation Therapy Management  Current Dose: 20 Gy     Planned Dose:  60 Gy  Narrative . . . . . . . . The patient presents for routine under treatment assessment.                                                         The patient is without complaint.                                 Set-up films were reviewed.                                 The chart was checked. Physical Findings. . .  weight is 122 lb 3.2 oz (55.43 kg). Her oral temperature is 98.7 F (37.1 C). Her blood pressure is 140/51 and her pulse is 57. Her respiration is 20 and oxygen saturation is 99%. . Weight essentially stable.  No significant changes. Impression . . . . . . . The patient is  tolerating radiation. Plan . . . . . . . . . . . . Continue treatment as planned.  ________________________________  Sheral Apley. Tammi Klippel, M.D.

## 2013-02-08 ENCOUNTER — Ambulatory Visit
Admission: RE | Admit: 2013-02-08 | Discharge: 2013-02-08 | Disposition: A | Discharge status: Home / Self Care | Payer: Medicare Other | Source: Ambulatory Visit | Attending: Radiation Oncology | Admitting: Radiation Oncology

## 2013-02-08 DIAGNOSIS — C3410 Malignant neoplasm of upper lobe, unspecified bronchus or lung: Secondary | ICD-10-CM | POA: Diagnosis not present

## 2013-02-08 DIAGNOSIS — M1710 Unilateral primary osteoarthritis, unspecified knee: Secondary | ICD-10-CM | POA: Diagnosis not present

## 2013-02-08 DIAGNOSIS — L259 Unspecified contact dermatitis, unspecified cause: Secondary | ICD-10-CM | POA: Diagnosis not present

## 2013-02-08 DIAGNOSIS — Z510 Encounter for antineoplastic radiation therapy: Secondary | ICD-10-CM | POA: Diagnosis not present

## 2013-02-08 DIAGNOSIS — Z79899 Other long term (current) drug therapy: Secondary | ICD-10-CM | POA: Diagnosis not present

## 2013-02-08 DIAGNOSIS — K209 Esophagitis, unspecified without bleeding: Secondary | ICD-10-CM | POA: Diagnosis not present

## 2013-02-09 ENCOUNTER — Ambulatory Visit
Admission: RE | Admit: 2013-02-09 | Discharge: 2013-02-09 | Disposition: A | Discharge status: Home / Self Care | Payer: Medicare Other | Source: Ambulatory Visit | Attending: Radiation Oncology | Admitting: Radiation Oncology

## 2013-02-09 DIAGNOSIS — L259 Unspecified contact dermatitis, unspecified cause: Secondary | ICD-10-CM | POA: Diagnosis not present

## 2013-02-09 DIAGNOSIS — Z510 Encounter for antineoplastic radiation therapy: Secondary | ICD-10-CM | POA: Diagnosis not present

## 2013-02-09 DIAGNOSIS — M1710 Unilateral primary osteoarthritis, unspecified knee: Secondary | ICD-10-CM | POA: Diagnosis not present

## 2013-02-09 DIAGNOSIS — Z79899 Other long term (current) drug therapy: Secondary | ICD-10-CM | POA: Diagnosis not present

## 2013-02-09 DIAGNOSIS — C3410 Malignant neoplasm of upper lobe, unspecified bronchus or lung: Secondary | ICD-10-CM | POA: Diagnosis not present

## 2013-02-09 DIAGNOSIS — K209 Esophagitis, unspecified without bleeding: Secondary | ICD-10-CM | POA: Diagnosis not present

## 2013-02-10 ENCOUNTER — Ambulatory Visit
Admission: RE | Admit: 2013-02-10 | Discharge: 2013-02-10 | Disposition: A | Discharge status: Home / Self Care | Payer: Medicare Other | Source: Ambulatory Visit | Attending: Radiation Oncology | Admitting: Radiation Oncology

## 2013-02-10 DIAGNOSIS — Z510 Encounter for antineoplastic radiation therapy: Secondary | ICD-10-CM | POA: Diagnosis not present

## 2013-02-10 DIAGNOSIS — L259 Unspecified contact dermatitis, unspecified cause: Secondary | ICD-10-CM | POA: Diagnosis not present

## 2013-02-10 DIAGNOSIS — M1710 Unilateral primary osteoarthritis, unspecified knee: Secondary | ICD-10-CM | POA: Diagnosis not present

## 2013-02-10 DIAGNOSIS — C3410 Malignant neoplasm of upper lobe, unspecified bronchus or lung: Secondary | ICD-10-CM | POA: Diagnosis not present

## 2013-02-10 DIAGNOSIS — Z79899 Other long term (current) drug therapy: Secondary | ICD-10-CM | POA: Diagnosis not present

## 2013-02-10 DIAGNOSIS — K209 Esophagitis, unspecified without bleeding: Secondary | ICD-10-CM | POA: Diagnosis not present

## 2013-02-11 ENCOUNTER — Ambulatory Visit
Admission: RE | Admit: 2013-02-11 | Discharge: 2013-02-11 | Disposition: A | Discharge status: Home / Self Care | Payer: Medicare Other | Source: Ambulatory Visit | Attending: Radiation Oncology | Admitting: Radiation Oncology

## 2013-02-11 ENCOUNTER — Encounter: Payer: Self-pay | Admitting: Radiation Oncology

## 2013-02-11 VITALS — BP 115/58 | HR 69 | Temp 97.0°F | Resp 20 | Wt 120.6 lb

## 2013-02-11 DIAGNOSIS — R918 Other nonspecific abnormal finding of lung field: Secondary | ICD-10-CM

## 2013-02-11 DIAGNOSIS — C3410 Malignant neoplasm of upper lobe, unspecified bronchus or lung: Secondary | ICD-10-CM | POA: Diagnosis not present

## 2013-02-11 DIAGNOSIS — L259 Unspecified contact dermatitis, unspecified cause: Secondary | ICD-10-CM | POA: Diagnosis not present

## 2013-02-11 DIAGNOSIS — Z510 Encounter for antineoplastic radiation therapy: Secondary | ICD-10-CM | POA: Diagnosis not present

## 2013-02-11 DIAGNOSIS — K209 Esophagitis, unspecified without bleeding: Secondary | ICD-10-CM | POA: Diagnosis not present

## 2013-02-11 DIAGNOSIS — M1710 Unilateral primary osteoarthritis, unspecified knee: Secondary | ICD-10-CM | POA: Diagnosis not present

## 2013-02-11 DIAGNOSIS — Z79899 Other long term (current) drug therapy: Secondary | ICD-10-CM | POA: Diagnosis not present

## 2013-02-11 MED ORDER — MAGIC MOUTHWASH W/LIDOCAINE: 5.0000 mL | Freq: Three times a day (TID) | ORAL | Status: DC

## 2013-02-11 MED ORDER — SUCRALFATE 1 G PO TABS: 1.0000 g | ORAL_TABLET | Freq: Four times a day (QID) | ORAL | Status: DC

## 2013-02-11 NOTE — Progress Notes (Signed)
Patient here weekly rad txs Lung ,LUL, 15 completed, alert,oriented x3, hoarseness, appetite good, "but when swallowing sometimes it hurts, sore" no coughing, no nausea, energy good, slight fatigue at times, no c/o sob, "my  Left leg gets tired from arthritis when walking down here", 98% room air sats 1:56 PM

## 2013-02-11 NOTE — Progress Notes (Signed)
Weekly Management Note Current Dose:33 Gy  Projected Dose:66 Gy   Narrative:  The patient presents for routine under treatment assessment.  CBCT/MVCT images/Port film x-rays were reviewed.  The chart was checked. Doing well. Some mild esophagitis when she swallows. Still eating normally.   Physical Findings:  Unchanged  Vitals:  Filed Vitals:   02/11/13 1349  BP: 115/58  Pulse: 69  Temp: 97 F (36.1 C)  Resp: 20   Weight:  Wt Readings from Last 3 Encounters:  02/11/13 120 lb 9.6 oz (54.704 kg)  02/04/13 122 lb 3.2 oz (55.43 kg)  01/27/13 123 lb 12.8 oz (56.155 kg)   Lab Results  Component Value Date   WBC 9.5 12/03/2012   HGB 12.5 12/03/2012   HCT 37.4 12/03/2012   MCV 96.2 12/03/2012   PLT 245.0 12/03/2012   Lab Results  Component Value Date   CREATININE 1.4* 12/03/2012   BUN 28* 12/03/2012   NA 138 12/03/2012   K 3.6 12/03/2012   CL 102 12/03/2012   CO2 28 12/03/2012     Impression:  The patient is tolerating radiation.  Plan:  Continue treatment as planned. Discussed use of carafate and MMW with lidocaine. Referal to dietician.

## 2013-02-12 ENCOUNTER — Ambulatory Visit
Admission: RE | Admit: 2013-02-12 | Discharge: 2013-02-12 | Disposition: A | Discharge status: Home / Self Care | Payer: Medicare Other | Source: Ambulatory Visit | Attending: Radiation Oncology | Admitting: Radiation Oncology

## 2013-02-12 DIAGNOSIS — Z79899 Other long term (current) drug therapy: Secondary | ICD-10-CM | POA: Diagnosis not present

## 2013-02-12 DIAGNOSIS — K209 Esophagitis, unspecified without bleeding: Secondary | ICD-10-CM | POA: Diagnosis not present

## 2013-02-12 DIAGNOSIS — M1710 Unilateral primary osteoarthritis, unspecified knee: Secondary | ICD-10-CM | POA: Diagnosis not present

## 2013-02-12 DIAGNOSIS — L259 Unspecified contact dermatitis, unspecified cause: Secondary | ICD-10-CM | POA: Diagnosis not present

## 2013-02-12 DIAGNOSIS — C3410 Malignant neoplasm of upper lobe, unspecified bronchus or lung: Secondary | ICD-10-CM | POA: Diagnosis not present

## 2013-02-12 DIAGNOSIS — Z510 Encounter for antineoplastic radiation therapy: Secondary | ICD-10-CM | POA: Diagnosis not present

## 2013-02-15 ENCOUNTER — Ambulatory Visit
Admission: RE | Admit: 2013-02-15 | Discharge: 2013-02-15 | Disposition: A | Discharge status: Home / Self Care | Payer: Medicare Other | Source: Ambulatory Visit | Attending: Radiation Oncology | Admitting: Radiation Oncology

## 2013-02-15 DIAGNOSIS — C3410 Malignant neoplasm of upper lobe, unspecified bronchus or lung: Secondary | ICD-10-CM | POA: Diagnosis not present

## 2013-02-15 DIAGNOSIS — K209 Esophagitis, unspecified without bleeding: Secondary | ICD-10-CM | POA: Diagnosis not present

## 2013-02-15 DIAGNOSIS — L259 Unspecified contact dermatitis, unspecified cause: Secondary | ICD-10-CM | POA: Diagnosis not present

## 2013-02-15 DIAGNOSIS — Z510 Encounter for antineoplastic radiation therapy: Secondary | ICD-10-CM | POA: Diagnosis not present

## 2013-02-15 DIAGNOSIS — Z79899 Other long term (current) drug therapy: Secondary | ICD-10-CM | POA: Diagnosis not present

## 2013-02-15 DIAGNOSIS — M1710 Unilateral primary osteoarthritis, unspecified knee: Secondary | ICD-10-CM | POA: Diagnosis not present

## 2013-02-16 ENCOUNTER — Ambulatory Visit
Admission: RE | Admit: 2013-02-16 | Discharge: 2013-02-16 | Disposition: A | Discharge status: Home / Self Care | Payer: Medicare Other | Source: Ambulatory Visit | Attending: Radiation Oncology | Admitting: Radiation Oncology

## 2013-02-16 DIAGNOSIS — Z79899 Other long term (current) drug therapy: Secondary | ICD-10-CM | POA: Diagnosis not present

## 2013-02-16 DIAGNOSIS — M1710 Unilateral primary osteoarthritis, unspecified knee: Secondary | ICD-10-CM | POA: Diagnosis not present

## 2013-02-16 DIAGNOSIS — K209 Esophagitis, unspecified without bleeding: Secondary | ICD-10-CM | POA: Diagnosis not present

## 2013-02-16 DIAGNOSIS — C3410 Malignant neoplasm of upper lobe, unspecified bronchus or lung: Secondary | ICD-10-CM | POA: Diagnosis not present

## 2013-02-16 DIAGNOSIS — L259 Unspecified contact dermatitis, unspecified cause: Secondary | ICD-10-CM | POA: Diagnosis not present

## 2013-02-16 DIAGNOSIS — Z510 Encounter for antineoplastic radiation therapy: Secondary | ICD-10-CM | POA: Diagnosis not present

## 2013-02-17 ENCOUNTER — Ambulatory Visit
Admission: RE | Admit: 2013-02-17 | Discharge: 2013-02-17 | Disposition: A | Discharge status: Home / Self Care | Payer: Medicare Other | Source: Ambulatory Visit | Attending: Radiation Oncology | Admitting: Radiation Oncology

## 2013-02-17 DIAGNOSIS — M1710 Unilateral primary osteoarthritis, unspecified knee: Secondary | ICD-10-CM | POA: Diagnosis not present

## 2013-02-17 DIAGNOSIS — L259 Unspecified contact dermatitis, unspecified cause: Secondary | ICD-10-CM | POA: Diagnosis not present

## 2013-02-17 DIAGNOSIS — K209 Esophagitis, unspecified without bleeding: Secondary | ICD-10-CM | POA: Diagnosis not present

## 2013-02-17 DIAGNOSIS — Z510 Encounter for antineoplastic radiation therapy: Secondary | ICD-10-CM | POA: Diagnosis not present

## 2013-02-17 DIAGNOSIS — Z79899 Other long term (current) drug therapy: Secondary | ICD-10-CM | POA: Diagnosis not present

## 2013-02-17 DIAGNOSIS — C3410 Malignant neoplasm of upper lobe, unspecified bronchus or lung: Secondary | ICD-10-CM | POA: Diagnosis not present

## 2013-02-18 ENCOUNTER — Ambulatory Visit
Admission: RE | Admit: 2013-02-18 | Discharge: 2013-02-18 | Disposition: A | Discharge status: Home / Self Care | Payer: Medicare Other | Source: Ambulatory Visit | Attending: Radiation Oncology | Admitting: Radiation Oncology

## 2013-02-18 ENCOUNTER — Encounter: Payer: Self-pay | Admitting: Radiation Oncology

## 2013-02-18 DIAGNOSIS — C3410 Malignant neoplasm of upper lobe, unspecified bronchus or lung: Secondary | ICD-10-CM | POA: Diagnosis not present

## 2013-02-18 DIAGNOSIS — K209 Esophagitis, unspecified without bleeding: Secondary | ICD-10-CM | POA: Diagnosis not present

## 2013-02-18 DIAGNOSIS — Z510 Encounter for antineoplastic radiation therapy: Secondary | ICD-10-CM | POA: Diagnosis not present

## 2013-02-18 DIAGNOSIS — L259 Unspecified contact dermatitis, unspecified cause: Secondary | ICD-10-CM | POA: Diagnosis not present

## 2013-02-18 DIAGNOSIS — M1710 Unilateral primary osteoarthritis, unspecified knee: Secondary | ICD-10-CM | POA: Diagnosis not present

## 2013-02-18 DIAGNOSIS — Z79899 Other long term (current) drug therapy: Secondary | ICD-10-CM | POA: Diagnosis not present

## 2013-02-18 NOTE — Progress Notes (Signed)
Valerie Young has received 20 fractions to her left upper lobe.  She states she no longer has irritation of her esophagus since she started on Carafate.   She has red, rash-like appearance to left chest and to her back.  She is applying Radiaplex gel sporadically and did not realize she had redness on her back until her family member noticed this.

## 2013-02-19 ENCOUNTER — Ambulatory Visit
Admission: RE | Admit: 2013-02-19 | Discharge: 2013-02-19 | Disposition: A | Discharge status: Home / Self Care | Payer: Medicare Other | Source: Ambulatory Visit | Attending: Radiation Oncology | Admitting: Radiation Oncology

## 2013-02-19 DIAGNOSIS — Z79899 Other long term (current) drug therapy: Secondary | ICD-10-CM | POA: Diagnosis not present

## 2013-02-19 DIAGNOSIS — K209 Esophagitis, unspecified without bleeding: Secondary | ICD-10-CM | POA: Diagnosis not present

## 2013-02-19 DIAGNOSIS — M1710 Unilateral primary osteoarthritis, unspecified knee: Secondary | ICD-10-CM | POA: Diagnosis not present

## 2013-02-19 DIAGNOSIS — C3410 Malignant neoplasm of upper lobe, unspecified bronchus or lung: Secondary | ICD-10-CM | POA: Diagnosis not present

## 2013-02-19 DIAGNOSIS — Z510 Encounter for antineoplastic radiation therapy: Secondary | ICD-10-CM | POA: Diagnosis not present

## 2013-02-19 DIAGNOSIS — L259 Unspecified contact dermatitis, unspecified cause: Secondary | ICD-10-CM | POA: Diagnosis not present

## 2013-02-22 ENCOUNTER — Ambulatory Visit
Admission: RE | Admit: 2013-02-22 | Discharge: 2013-02-22 | Disposition: A | Discharge status: Home / Self Care | Payer: Medicare Other | Source: Ambulatory Visit | Attending: Radiation Oncology | Admitting: Radiation Oncology

## 2013-02-22 DIAGNOSIS — L259 Unspecified contact dermatitis, unspecified cause: Secondary | ICD-10-CM | POA: Diagnosis not present

## 2013-02-22 DIAGNOSIS — K209 Esophagitis, unspecified without bleeding: Secondary | ICD-10-CM | POA: Diagnosis not present

## 2013-02-22 DIAGNOSIS — Z79899 Other long term (current) drug therapy: Secondary | ICD-10-CM | POA: Diagnosis not present

## 2013-02-22 DIAGNOSIS — Z510 Encounter for antineoplastic radiation therapy: Secondary | ICD-10-CM | POA: Diagnosis not present

## 2013-02-22 DIAGNOSIS — C3410 Malignant neoplasm of upper lobe, unspecified bronchus or lung: Secondary | ICD-10-CM | POA: Diagnosis not present

## 2013-02-22 DIAGNOSIS — M1710 Unilateral primary osteoarthritis, unspecified knee: Secondary | ICD-10-CM | POA: Diagnosis not present

## 2013-02-23 ENCOUNTER — Ambulatory Visit
Admission: RE | Admit: 2013-02-23 | Discharge: 2013-02-23 | Disposition: A | Discharge status: Home / Self Care | Payer: Medicare Other | Source: Ambulatory Visit | Attending: Radiation Oncology | Admitting: Radiation Oncology

## 2013-02-23 ENCOUNTER — Ambulatory Visit: Payer: Medicare Other | Admitting: Nutrition

## 2013-02-23 DIAGNOSIS — Z79899 Other long term (current) drug therapy: Secondary | ICD-10-CM | POA: Diagnosis not present

## 2013-02-23 DIAGNOSIS — M1710 Unilateral primary osteoarthritis, unspecified knee: Secondary | ICD-10-CM | POA: Diagnosis not present

## 2013-02-23 DIAGNOSIS — C3410 Malignant neoplasm of upper lobe, unspecified bronchus or lung: Secondary | ICD-10-CM | POA: Diagnosis not present

## 2013-02-23 DIAGNOSIS — L259 Unspecified contact dermatitis, unspecified cause: Secondary | ICD-10-CM | POA: Diagnosis not present

## 2013-02-23 DIAGNOSIS — Z510 Encounter for antineoplastic radiation therapy: Secondary | ICD-10-CM | POA: Diagnosis not present

## 2013-02-23 DIAGNOSIS — K209 Esophagitis, unspecified without bleeding: Secondary | ICD-10-CM | POA: Diagnosis not present

## 2013-02-23 NOTE — Progress Notes (Signed)
Patient is an 79 year old female patient of Dr. Tammi Klippel diagnosed with lung cancer receiving radiation therapy.  Past medical history includes hypothyroidism, hypertension, renal insufficiency, osteoarthritis, COPD, and degenerative joint disease.  Medications include Xanax, Magic mouthwash, vitamin D, Lasix, Synthroid, and Carafate.  Labs were reviewed.  Height: 64 inches. Weight: 124.2 pounds March 27. Usual body weight 124 pounds in January 2014. Patient weight 139 pounds in 2009. BMI: 21.31.  Patient reports some difficulty swallowing. She needs to choose smaller bites and moist foods. She does have ensure or boost available but has not been drinking them on a regular basis. She states she occasionally has constipation.  Nutrition diagnosis: Food and nutrition related knowledge deficit related to diagnosis of lung cancer and associated radiation therapy as evidenced by no prior need for nutrition related information.  Intervention: I've educated patient on the importance of smaller, high-calorie, high-protein meals and snacks throughout the day. I've educated patient on the importance of consuming one oral nutrition supplement daily in the form of boost plus, Ensure Plus, or Carnation breakfast essentials. I provided samples for her to take with her today. I've educated her on strategies for increased intake with esophagitis. I provided additional fact sheets and my contact information. Teach back method used.  Monitoring, evaluation, goals: Patient will tolerate adequate calories and protein to minimize weight loss and improve healing.  Next visit: Patient has my contact information and will call me with questions or concerns.

## 2013-02-24 ENCOUNTER — Ambulatory Visit
Admission: RE | Admit: 2013-02-24 | Discharge: 2013-02-24 | Disposition: A | Discharge status: Home / Self Care | Payer: Medicare Other | Source: Ambulatory Visit | Attending: Radiation Oncology | Admitting: Radiation Oncology

## 2013-02-24 DIAGNOSIS — Z510 Encounter for antineoplastic radiation therapy: Secondary | ICD-10-CM | POA: Diagnosis not present

## 2013-02-24 DIAGNOSIS — K209 Esophagitis, unspecified without bleeding: Secondary | ICD-10-CM | POA: Diagnosis not present

## 2013-02-24 DIAGNOSIS — M1710 Unilateral primary osteoarthritis, unspecified knee: Secondary | ICD-10-CM | POA: Diagnosis not present

## 2013-02-24 DIAGNOSIS — Z79899 Other long term (current) drug therapy: Secondary | ICD-10-CM | POA: Diagnosis not present

## 2013-02-24 DIAGNOSIS — C3410 Malignant neoplasm of upper lobe, unspecified bronchus or lung: Secondary | ICD-10-CM | POA: Diagnosis not present

## 2013-02-24 DIAGNOSIS — L259 Unspecified contact dermatitis, unspecified cause: Secondary | ICD-10-CM | POA: Diagnosis not present

## 2013-02-25 ENCOUNTER — Encounter: Payer: Self-pay | Admitting: Radiation Oncology

## 2013-02-25 ENCOUNTER — Ambulatory Visit
Admission: RE | Admit: 2013-02-25 | Discharge: 2013-02-25 | Disposition: A | Discharge status: Home / Self Care | Payer: Medicare Other | Source: Ambulatory Visit | Attending: Radiation Oncology | Admitting: Radiation Oncology

## 2013-02-25 DIAGNOSIS — Z79899 Other long term (current) drug therapy: Secondary | ICD-10-CM | POA: Diagnosis not present

## 2013-02-25 DIAGNOSIS — M1710 Unilateral primary osteoarthritis, unspecified knee: Secondary | ICD-10-CM | POA: Diagnosis not present

## 2013-02-25 DIAGNOSIS — C3492 Malignant neoplasm of unspecified part of left bronchus or lung: Secondary | ICD-10-CM

## 2013-02-25 DIAGNOSIS — Z510 Encounter for antineoplastic radiation therapy: Secondary | ICD-10-CM | POA: Diagnosis not present

## 2013-02-25 DIAGNOSIS — K209 Esophagitis, unspecified without bleeding: Secondary | ICD-10-CM | POA: Diagnosis not present

## 2013-02-25 DIAGNOSIS — C3410 Malignant neoplasm of upper lobe, unspecified bronchus or lung: Secondary | ICD-10-CM | POA: Diagnosis not present

## 2013-02-25 DIAGNOSIS — R918 Other nonspecific abnormal finding of lung field: Secondary | ICD-10-CM

## 2013-02-25 DIAGNOSIS — L259 Unspecified contact dermatitis, unspecified cause: Secondary | ICD-10-CM | POA: Diagnosis not present

## 2013-02-25 MED ORDER — RADIAPLEXRX EX GEL
Freq: Once | CUTANEOUS | Status: AC
  Administered 2013-02-25: 14:00:00 via TOPICAL

## 2013-02-25 NOTE — Progress Notes (Signed)
Patient here for weekly rad tx Left lung, 25/30 completed, does have some difficulty swallowing foods, starts to eat first before taking carafate, then she remembers to take the carafate and then it is fine stated patient, no nausea or sob, , room air sats=98%, bright erythema dermatitis on front and back of left chest wall, denys itching,, using radiaplex gel bid, saw Pamala Hurry Neff,dietcian 02/23/13,  1:55 PM

## 2013-02-25 NOTE — Progress Notes (Signed)
  Radiation Oncology         [phone] ________________________________  Name: Valerie Young MRN: [id number]  Date: 02/25/2013  DOB: 12-03-1928  Weekly Radiation Therapy Management  Current Dose: 55 Gy     Planned Dose:  66 Gy  Narrative . . . . . . . . The patient presents for routine under treatment assessment.                                            Patient here for weekly rad tx Left lung, 25/30 completed, does have some difficulty swallowing foods, starts to eat first before taking carafate, then she remembers to take the carafate and then it is fine stated patient, no nausea or sob, , room air sats=98%, bright erythema dermatitis on front and back of left chest wall, denys itching,, using radiaplex gel bid, saw Pamala Hurry Neff,dietcian 02/23/13                                 Set-up films were reviewed.                                 The chart was checked. Physical Findings. . .  oral temperature is 97.4 F (36.3 C). Her blood pressure is 148/71 and her pulse is 62. Her respiration is 20 and oxygen saturation is 98%. . Weight essentially stable.  No significant changes. Impression . . . . . . . The patient is  tolerating radiation. Plan . . . . . . . . . . . . Continue treatment as planned.  ________________________________  Sheral Apley. Tammi Klippel, M.D.

## 2013-02-26 ENCOUNTER — Other Ambulatory Visit: Payer: Self-pay | Admitting: Rheumatology

## 2013-02-26 ENCOUNTER — Ambulatory Visit
Admission: RE | Admit: 2013-02-26 | Discharge: 2013-02-26 | Disposition: A | Discharge status: Home / Self Care | Payer: Medicare Other | Source: Ambulatory Visit | Attending: Radiation Oncology | Admitting: Radiation Oncology

## 2013-02-26 DIAGNOSIS — L259 Unspecified contact dermatitis, unspecified cause: Secondary | ICD-10-CM | POA: Diagnosis not present

## 2013-02-26 DIAGNOSIS — C3410 Malignant neoplasm of upper lobe, unspecified bronchus or lung: Secondary | ICD-10-CM | POA: Diagnosis not present

## 2013-02-26 DIAGNOSIS — C3490 Malignant neoplasm of unspecified part of unspecified bronchus or lung: Secondary | ICD-10-CM | POA: Diagnosis not present

## 2013-02-26 DIAGNOSIS — M2550 Pain in unspecified joint: Secondary | ICD-10-CM | POA: Diagnosis not present

## 2013-02-26 DIAGNOSIS — K209 Esophagitis, unspecified without bleeding: Secondary | ICD-10-CM | POA: Diagnosis not present

## 2013-02-26 DIAGNOSIS — M064 Inflammatory polyarthropathy: Secondary | ICD-10-CM

## 2013-02-26 DIAGNOSIS — M1710 Unilateral primary osteoarthritis, unspecified knee: Secondary | ICD-10-CM | POA: Diagnosis not present

## 2013-02-26 DIAGNOSIS — Z79899 Other long term (current) drug therapy: Secondary | ICD-10-CM | POA: Diagnosis not present

## 2013-02-26 DIAGNOSIS — Z1589 Genetic susceptibility to other disease: Secondary | ICD-10-CM | POA: Diagnosis not present

## 2013-02-26 DIAGNOSIS — Z510 Encounter for antineoplastic radiation therapy: Secondary | ICD-10-CM | POA: Diagnosis not present

## 2013-03-01 ENCOUNTER — Ambulatory Visit
Admission: RE | Admit: 2013-03-01 | Discharge: 2013-03-01 | Disposition: A | Discharge status: Home / Self Care | Payer: Medicare Other | Source: Ambulatory Visit | Attending: Radiation Oncology | Admitting: Radiation Oncology

## 2013-03-01 DIAGNOSIS — C3410 Malignant neoplasm of upper lobe, unspecified bronchus or lung: Secondary | ICD-10-CM | POA: Diagnosis not present

## 2013-03-01 DIAGNOSIS — Z79899 Other long term (current) drug therapy: Secondary | ICD-10-CM | POA: Diagnosis not present

## 2013-03-01 DIAGNOSIS — L259 Unspecified contact dermatitis, unspecified cause: Secondary | ICD-10-CM | POA: Diagnosis not present

## 2013-03-01 DIAGNOSIS — Z510 Encounter for antineoplastic radiation therapy: Secondary | ICD-10-CM | POA: Diagnosis not present

## 2013-03-01 DIAGNOSIS — K209 Esophagitis, unspecified without bleeding: Secondary | ICD-10-CM | POA: Diagnosis not present

## 2013-03-01 DIAGNOSIS — M1710 Unilateral primary osteoarthritis, unspecified knee: Secondary | ICD-10-CM | POA: Diagnosis not present

## 2013-03-02 ENCOUNTER — Ambulatory Visit
Admission: RE | Admit: 2013-03-02 | Discharge: 2013-03-02 | Disposition: A | Discharge status: Home / Self Care | Payer: Medicare Other | Source: Ambulatory Visit | Attending: Radiation Oncology | Admitting: Radiation Oncology

## 2013-03-02 DIAGNOSIS — Z510 Encounter for antineoplastic radiation therapy: Secondary | ICD-10-CM | POA: Diagnosis not present

## 2013-03-02 DIAGNOSIS — L259 Unspecified contact dermatitis, unspecified cause: Secondary | ICD-10-CM | POA: Diagnosis not present

## 2013-03-02 DIAGNOSIS — K209 Esophagitis, unspecified without bleeding: Secondary | ICD-10-CM | POA: Diagnosis not present

## 2013-03-02 DIAGNOSIS — M1710 Unilateral primary osteoarthritis, unspecified knee: Secondary | ICD-10-CM | POA: Diagnosis not present

## 2013-03-02 DIAGNOSIS — C3410 Malignant neoplasm of upper lobe, unspecified bronchus or lung: Secondary | ICD-10-CM | POA: Diagnosis not present

## 2013-03-02 DIAGNOSIS — Z79899 Other long term (current) drug therapy: Secondary | ICD-10-CM | POA: Diagnosis not present

## 2013-03-03 ENCOUNTER — Ambulatory Visit
Admission: RE | Admit: 2013-03-03 | Discharge: 2013-03-03 | Disposition: A | Discharge status: Home / Self Care | Payer: Medicare Other | Source: Ambulatory Visit | Attending: Radiation Oncology | Admitting: Radiation Oncology

## 2013-03-03 DIAGNOSIS — Z510 Encounter for antineoplastic radiation therapy: Secondary | ICD-10-CM | POA: Diagnosis not present

## 2013-03-03 DIAGNOSIS — Z79899 Other long term (current) drug therapy: Secondary | ICD-10-CM | POA: Diagnosis not present

## 2013-03-03 DIAGNOSIS — M1710 Unilateral primary osteoarthritis, unspecified knee: Secondary | ICD-10-CM | POA: Diagnosis not present

## 2013-03-03 DIAGNOSIS — K209 Esophagitis, unspecified without bleeding: Secondary | ICD-10-CM | POA: Diagnosis not present

## 2013-03-03 DIAGNOSIS — C3410 Malignant neoplasm of upper lobe, unspecified bronchus or lung: Secondary | ICD-10-CM | POA: Diagnosis not present

## 2013-03-03 DIAGNOSIS — L259 Unspecified contact dermatitis, unspecified cause: Secondary | ICD-10-CM | POA: Diagnosis not present

## 2013-03-04 ENCOUNTER — Ambulatory Visit
Admission: RE | Admit: 2013-03-04 | Discharge: 2013-03-04 | Disposition: A | Discharge status: Home / Self Care | Payer: Medicare Other | Source: Ambulatory Visit | Attending: Radiation Oncology | Admitting: Radiation Oncology

## 2013-03-04 ENCOUNTER — Encounter: Payer: Self-pay | Admitting: Radiation Oncology

## 2013-03-04 ENCOUNTER — Ambulatory Visit: Payer: Medicare Other

## 2013-03-04 DIAGNOSIS — M1710 Unilateral primary osteoarthritis, unspecified knee: Secondary | ICD-10-CM | POA: Diagnosis not present

## 2013-03-04 DIAGNOSIS — C3410 Malignant neoplasm of upper lobe, unspecified bronchus or lung: Secondary | ICD-10-CM | POA: Diagnosis not present

## 2013-03-04 DIAGNOSIS — Z510 Encounter for antineoplastic radiation therapy: Secondary | ICD-10-CM | POA: Diagnosis not present

## 2013-03-04 DIAGNOSIS — Z79899 Other long term (current) drug therapy: Secondary | ICD-10-CM | POA: Diagnosis not present

## 2013-03-04 DIAGNOSIS — L259 Unspecified contact dermatitis, unspecified cause: Secondary | ICD-10-CM | POA: Diagnosis not present

## 2013-03-04 DIAGNOSIS — K209 Esophagitis, unspecified without bleeding: Secondary | ICD-10-CM | POA: Diagnosis not present

## 2013-03-04 NOTE — Progress Notes (Signed)
Patient complete left lung rad tx 30/30, alert,oriented x3, denys pain, front and back of left chest wall bright erythema with dry desquamation, dry,peeling, , using radiaplex  2-3x day\slight itching at times, no sob,  Does have a dry cough, good, uses carafate before meals, states"my esophagus has felt a lot better last day or two",  appetite, good room air sats=98%  1:59 PM  .

## 2013-03-04 NOTE — Progress Notes (Signed)
   Department of Radiation Oncology  Phone:  [phone] Fax:        [phone]  Weekly Treatment Note    Name: Valerie Young Date: 03/04/2013 MRN: [id number] DOB: 08-24-1929   Current dose: 66 Gy  Current fraction: 30   MEDICATIONS: Current Outpatient Prescriptions  Medication Sig Dispense Refill  . acetaminophen (TYLENOL) 325 MG tablet Take 650 mg by mouth every 6 (six) hours as needed for pain.      [name] ALPRAZolam (XANAX) 0.5 MG tablet Take 1 tablet every 12 hours as needed for anxiety.  20 tablet  0  . Alum & Mag Hydroxide-Simeth (MAGIC MOUTHWASH W/LIDOCAINE) SOLN Take 5 mLs by mouth 3 (three) times daily before meals.  500 mL  1  . amLODipine (NORVASC) 5 MG tablet Take 5 mg by mouth at bedtime.      [name] atenolol (TENORMIN) 50 MG tablet Take 1 tablet by mouth daily.      . cholecalciferol (VITAMIN D) 1000 UNITS tablet Take 1,000 Units by mouth daily.      [name] estradiol (ESTRACE) 0.5 MG tablet Take 1 tablet by mouth daily.      . furosemide (LASIX) 20 MG tablet Take 20 mg by mouth daily.      . hyaluronate sodium (RADIAPLEXRX) GEL Apply 1 application topically 2 (two) times daily.      [name] levothyroxine (SYNTHROID, LEVOTHROID) 50 MCG tablet Take 1 tablet by mouth daily.      [name] lisinopril (PRINIVIL,ZESTRIL) 20 MG tablet Take 1 tablet by mouth daily.      . sucralfate (CARAFATE) 1 G tablet Take 1 tablet (1 g total) by mouth 4 (four) times daily. Dissolve tablet in 8 oz of water and swallow.  60 tablet  1   No current facility-administered medications for this encounter.     ALLERGIES: Codeine and Sulfamethoxazole w-trimethoprim   LABORATORY DATA:  Lab Results  Component Value Date   WBC 9.5 12/03/2012   HGB 12.5 12/03/2012   HCT 37.4 12/03/2012   MCV 96.2 12/03/2012   PLT 245.0 12/03/2012   Lab Results  Component Value Date   NA 138 12/03/2012   K 3.6 12/03/2012   CL 102 12/03/2012   CO2 28 12/03/2012   Lab Results  Component Value Date   ALT 13 12/03/2012   AST 21 12/03/2012   ALKPHOS 60 12/03/2012   BILITOT 0.6 12/03/2012     NARRATIVE: Valerie Young was seen today for weekly treatment management. The chart was checked and the patient's films were reviewed. The patient finished her final fraction today. She states that she has done well. The esophagitis has improved a little bit over the last several days. She does have medication for this. The patient's skin does state a little bit but has been stable.  PHYSICAL EXAMINATION: weight is 121 lb (54.885 kg). Her oral temperature is 97.6 F (36.4 C). Her blood pressure is 118/65 and her pulse is 61. Her respiration is 20 and oxygen saturation is 98%.      dry desquamation present in the chest, anteriorly and posteriorly.  ASSESSMENT: The patient did  satisfactorily with treatment.  PLAN:  followup with Dr. Tammi Klippel in one month.

## 2013-03-05 ENCOUNTER — Ambulatory Visit
Admission: RE | Admit: 2013-03-05 | Discharge: 2013-03-05 | Disposition: A | Discharge status: Home / Self Care | Payer: Medicare Other | Source: Ambulatory Visit | Attending: Rheumatology | Admitting: Rheumatology

## 2013-03-05 ENCOUNTER — Ambulatory Visit: Payer: Medicare Other

## 2013-03-05 DIAGNOSIS — M064 Inflammatory polyarthropathy: Secondary | ICD-10-CM

## 2013-03-05 DIAGNOSIS — M1710 Unilateral primary osteoarthritis, unspecified knee: Secondary | ICD-10-CM | POA: Diagnosis not present

## 2013-03-05 DIAGNOSIS — M25469 Effusion, unspecified knee: Secondary | ICD-10-CM | POA: Diagnosis not present

## 2013-03-05 DIAGNOSIS — IMO0002 Reserved for concepts with insufficient information to code with codable children: Secondary | ICD-10-CM | POA: Diagnosis not present

## 2013-03-05 NOTE — Progress Notes (Signed)
°  Radiation Oncology         [phone] ________________________________  Name: MAUREN MELBY MRN: [id number]  Date: 03/04/2013  DOB: Sep 21, 1929  End of Treatment Note  Diagnosis:   79 yo woman with T2 N0 M0 adenocarcinoma of the left upper lung   Indication for treatment:  Curative definitive radiotherapy       Radiation treatment dates:   01/21/2013-03/04/2013  Site/dose:   The tumor received 66 gray in 30 fractions of 2.2 gray  Beams/energy:   A 5 field treatment technique was employed using anterior, posterior, right anterior oblique and left posterior oblique fields with a lightly weighted left lateral to reduce hot spots in the anterior and posterior entry positions. Treatment was 3-D conformal with 1/2-2 cm margins around the target from each angle. The V 24 the lungs was approximately 23%  Narrative: The patient tolerated radiation treatment relatively well.   She experienced esophagitis but maintained her weight.  Plan: The patient has completed radiation treatment. The patient will return to radiation oncology clinic for routine followup in one month. I advised them to call or return sooner if they have any questions or concerns related to their recovery or treatment. ________________________________  Sheral Apley. Tammi Klippel, M.D.

## 2013-03-08 ENCOUNTER — Ambulatory Visit: Payer: Medicare Other

## 2013-04-08 ENCOUNTER — Encounter: Payer: Self-pay | Admitting: Radiation Oncology

## 2013-04-08 ENCOUNTER — Ambulatory Visit
Admission: RE | Admit: 2013-04-08 | Discharge: 2013-04-08 | Disposition: A | Discharge status: Home / Self Care | Payer: Medicare Other | Source: Ambulatory Visit | Attending: Radiation Oncology | Admitting: Radiation Oncology

## 2013-04-08 ENCOUNTER — Telehealth: Payer: Self-pay | Admitting: *Deleted

## 2013-04-08 MED ORDER — HYDROCODONE-ACETAMINOPHEN 5-325MG PREPACK (~~LOC~~: 1.0000 | ORAL_TABLET | ORAL | Status: DC | PRN

## 2013-04-08 NOTE — Progress Notes (Signed)
  Radiation Oncology         [phone] ________________________________  Name: Valerie Young MRN: [id number]  Date: 04/08/2013  DOB: 08/23/1929  Follow-Up Visit Note  CC: GATES,ROBERT NEVILL, MD  No ref. provider found  Diagnosis:   79 yo woman with T2 N0 M0 adenocarcinoma of the left upper lung s/p Curative definitive radiotherapy  01/21/2013-03/04/2013 to 66 gray in 30 fractions of 2.2 gray  Interval Since Last Radiation:  1  months  Narrative:  The patient returns today for routine follow-up.  She denies any SOB, nor pain. She reports that occasionally foods "gets stuck in there throat" if she does not chew well or if she eats too large of a portion. Travel by wheelchair since she has arthritis in her left knee with recent knee MRI. She is alone today.                           ALLERGIES:  is allergic to codeine and sulfamethoxazole w-trimethoprim.  Meds: Current Outpatient Prescriptions  Medication Sig Dispense Refill  . acetaminophen (TYLENOL) 325 MG tablet Take 650 mg by mouth every 6 (six) hours as needed for pain.      [name] ALPRAZolam (XANAX) 0.5 MG tablet Take 1 tablet every 12 hours as needed for anxiety.  20 tablet  0  . amLODipine (NORVASC) 5 MG tablet Take 5 mg by mouth at bedtime.      [name] atenolol (TENORMIN) 50 MG tablet Take 1 tablet by mouth daily.      . cholecalciferol (VITAMIN D) 1000 UNITS tablet Take 1,000 Units by mouth daily.      . furosemide (LASIX) 20 MG tablet Take 20 mg by mouth daily.      [name] levothyroxine (SYNTHROID, LEVOTHROID) 50 MCG tablet Take 1 tablet by mouth daily.      [name] lisinopril (PRINIVIL,ZESTRIL) 20 MG tablet Take 1 tablet by mouth daily.      . sucralfate (CARAFATE) 1 G tablet Take 1 tablet (1 g total) by mouth 4 (four) times daily. Dissolve tablet in 8 oz of water and swallow.  60 tablet  1  . estradiol (ESTRACE) 0.5 MG tablet Take 1 tablet by mouth daily.       No current facility-administered medications for this encounter.    Physical  Findings: The patient is in no acute distress. Patient is alert and oriented.  height is 5\' 4"  (1.626 m) and weight is 121 lb 14.4 oz (55.293 kg). Her temperature is 97.7 F (36.5 C). Her blood pressure is 113/66 and her pulse is 61. Her respiration is 16 and oxygen saturation is 97%. .  No significant changes.  Lab Findings: Lab Results  Component Value Date   WBC 9.5 12/03/2012   HGB 12.5 12/03/2012   HCT 37.4 12/03/2012   MCV 96.2 12/03/2012   PLT 245.0 12/03/2012    Impression:  The patient is recovering from the effects of radiation.    Plan:  CT in 2 weeks then follow-up.  _____________________________________  Sheral Apley. Tammi Klippel, M.D.

## 2013-04-08 NOTE — Progress Notes (Addendum)
Valerie Young here today for assessment s/p radiation therapy to the Left Upper Lobe which completed on 03/04/13. She denies any SOB, nor pain.  She reports that occasionally foods "gets stuck in there throat" if she does not chew well or if she eats too large of a portion.  Travel by wheelchair since she has arthritis in her left knee.   She is alone today.

## 2013-04-08 NOTE — Telephone Encounter (Signed)
CALLED PATIENT TO INFORM OF TEST AND FU VISIT, LVM FOR A RETURN CALL 

## 2013-04-22 ENCOUNTER — Ambulatory Visit (HOSPITAL_COMMUNITY)
Admission: RE | Admit: 2013-04-22 | Discharge: 2013-04-22 | Disposition: A | Discharge status: Home / Self Care | Payer: Medicare Other | Source: Ambulatory Visit | Attending: Radiation Oncology | Admitting: Radiation Oncology

## 2013-04-22 DIAGNOSIS — R918 Other nonspecific abnormal finding of lung field: Secondary | ICD-10-CM | POA: Insufficient documentation

## 2013-04-22 DIAGNOSIS — Z902 Acquired absence of lung [part of]: Secondary | ICD-10-CM | POA: Insufficient documentation

## 2013-04-22 DIAGNOSIS — E0789 Other specified disorders of thyroid: Secondary | ICD-10-CM | POA: Insufficient documentation

## 2013-04-22 DIAGNOSIS — C3490 Malignant neoplasm of unspecified part of unspecified bronchus or lung: Secondary | ICD-10-CM | POA: Diagnosis not present

## 2013-04-22 DIAGNOSIS — Z923 Personal history of irradiation: Secondary | ICD-10-CM | POA: Diagnosis not present

## 2013-04-22 DIAGNOSIS — R911 Solitary pulmonary nodule: Secondary | ICD-10-CM | POA: Insufficient documentation

## 2013-04-22 DIAGNOSIS — J438 Other emphysema: Secondary | ICD-10-CM | POA: Diagnosis not present

## 2013-05-03 DIAGNOSIS — I10 Essential (primary) hypertension: Secondary | ICD-10-CM | POA: Diagnosis not present

## 2013-05-03 DIAGNOSIS — N183 Chronic kidney disease, stage 3 unspecified: Secondary | ICD-10-CM | POA: Diagnosis not present

## 2013-05-03 DIAGNOSIS — I129 Hypertensive chronic kidney disease with stage 1 through stage 4 chronic kidney disease, or unspecified chronic kidney disease: Secondary | ICD-10-CM | POA: Diagnosis not present

## 2013-05-03 DIAGNOSIS — N2581 Secondary hyperparathyroidism of renal origin: Secondary | ICD-10-CM | POA: Diagnosis not present

## 2013-05-06 ENCOUNTER — Ambulatory Visit: Admission: RE | Admit: 2013-05-06 | Payer: Medicare Other | Source: Ambulatory Visit | Admitting: Radiation Oncology

## 2013-05-07 ENCOUNTER — Ambulatory Visit
Admission: RE | Admit: 2013-05-07 | Discharge: 2013-05-07 | Disposition: A | Discharge status: Home / Self Care | Payer: Medicare Other | Source: Ambulatory Visit | Attending: Radiation Oncology | Admitting: Radiation Oncology

## 2013-05-07 ENCOUNTER — Encounter: Payer: Self-pay | Admitting: Radiation Oncology

## 2013-05-07 NOTE — Progress Notes (Signed)
Appointment cards for September chest CT and follow up with Dr. Tammi Klippel given.

## 2013-05-07 NOTE — Progress Notes (Signed)
Radiation Oncology         [phone] ________________________________  Name: Valerie Young MRN: [id number]  Date: 05/07/2013  DOB: [birth year]  Follow-Up Visit Note  CC: Henrine Screws, MD  Josetta Huddle, MD  Diagnosis:   79 yo woman with T2 N0 M0 adenocarcinoma of the left upper lung s/p Curative definitive radiotherapy 01/21/2013-03/04/2013 to 66 gray in 30 fractions of 2.2 gray  Interval Since Last Radiation:  2  months  Narrative:  The patient returns today for routine follow-up.  She had follow-up chest CT.                             ALLERGIES:  is allergic to codeine and sulfamethoxazole w-trimethoprim.  Meds: Current Outpatient Prescriptions  Medication Sig Dispense Refill  . amLODipine (NORVASC) 5 MG tablet Take 5 mg by mouth at bedtime.      [name] atenolol (TENORMIN) 50 MG tablet Take 1 tablet by mouth daily.      . cholecalciferol (VITAMIN D) 1000 UNITS tablet Take 1,000 Units by mouth daily.      . furosemide (LASIX) 20 MG tablet Take 20 mg by mouth daily.      [name] HYDROcodone-acetaminophen (VICODIN) 5-325 mg TABS Take 6 tablets by mouth every 4 (four) hours as needed.  60 tablet  5  . levothyroxine (SYNTHROID, LEVOTHROID) 50 MCG tablet Take 1 tablet by mouth daily.      [name] lisinopril (PRINIVIL,ZESTRIL) 20 MG tablet Take 1 tablet by mouth daily.      [name] acetaminophen (TYLENOL) 325 MG tablet Take 650 mg by mouth every 6 (six) hours as needed for pain.      [name] ALPRAZolam (XANAX) 0.5 MG tablet Take 1 tablet every 12 hours as needed for anxiety.  20 tablet  0  . estradiol (ESTRACE) 0.5 MG tablet Take 1 tablet by mouth daily.      . sucralfate (CARAFATE) 1 G tablet Take 1 tablet (1 g total) by mouth 4 (four) times daily. Dissolve tablet in 8 oz of water and swallow.  60 tablet  1   No current facility-administered medications for this encounter.    Physical Findings: The patient is in no acute distress. Patient is alert and oriented.  weight is 120 lb 3.2 oz (54.522 kg). Her  oral temperature is 98.2 F (36.8 C). Her blood pressure is 125/44 and her pulse is 60. Her respiration is 18 and oxygen saturation is 97%. .  No significant changes.  Lab Findings: Lab Results  Component Value Date   WBC 9.5 12/03/2012   HGB 12.5 12/03/2012   HCT 37.4 12/03/2012   MCV 96.2 12/03/2012   PLT 245.0 12/03/2012   Radiographic Findings: Ct Chest Wo Contrast  04/22/2013   *RADIOLOGY REPORT*  Clinical Data: Restaging for lung adenocarcinoma.  Recently completed radiation therapy.  CT CHEST WITHOUT CONTRAST  Technique:  Multidetector CT imaging of the chest was performed following the standard protocol without IV contrast.  Comparison: PET CT on 12/21/2012  Findings: Persistent area of consolidation is again seen in the left upper lobe which measures approximately 5.5 x 4.2 cm.  This shows no significant change compared to prior exam.  New mild airspace disease is seen in the superior segment left lower lobe which is likely due to radiation pneumonitis.  Tiny sub- centimeter nodule in the left lower lobe on image 29 remains stable.  No definite hilar or mediastinal lymphadenopathy identified this noncontrast  study.  No evidence of pleural or pericardial effusion. Both adrenal glands are normal in appearance.  No suspicious bone lesions identified.  IMPRESSION:  1.  Stable mass-like consolidation in the left upper lobe. 2.  New mild infiltrate in the superior segment of left lower lobe, likely due to radiation pneumonitis. 3.  No definite evidence of lymphadenopathy or other metastatic disease. 4.  Emphysema.   Original Report Authenticated By: Earle Gell, M.D.   Impression:  The patient is recovering from the effects of radiation.  Her follow-up chest CT shows no progression.  Plan:  Follow-up in 3 months with chest CT and clinic visit  _____________________________________  Sheral Apley. Tammi Klippel, M.D.

## 2013-05-07 NOTE — Progress Notes (Signed)
Here with daughter, [name]. Maintained weight despite decreased appetite. Wheeled in wheelchair today due to arthritic left knee but, able to stand for weight. Reports CNA comes into the home three times per week. Denies difficulty or painful swallowing. Denies shortness of breath or cough. Reports nausea occasionally when she take vicodin for her knee. Reports managing constipation with stool softener and miralax.

## 2013-05-10 ENCOUNTER — Telehealth: Payer: Self-pay | Admitting: *Deleted

## 2013-05-10 NOTE — Telephone Encounter (Signed)
Called patient to inform of appt. With Dr. Onnie Graham on 05-24-13- arrival time - 9:00 a.m., spoke with patient and she is aware of this appt.

## 2013-05-10 NOTE — Telephone Encounter (Signed)
xxxxx 

## 2013-05-31 DIAGNOSIS — IMO0002 Reserved for concepts with insufficient information to code with codable children: Secondary | ICD-10-CM | POA: Diagnosis not present

## 2013-05-31 DIAGNOSIS — M1710 Unilateral primary osteoarthritis, unspecified knee: Secondary | ICD-10-CM | POA: Diagnosis not present

## 2013-05-31 DIAGNOSIS — M25569 Pain in unspecified knee: Secondary | ICD-10-CM | POA: Diagnosis not present

## 2013-08-02 DIAGNOSIS — N183 Chronic kidney disease, stage 3 unspecified: Secondary | ICD-10-CM | POA: Diagnosis not present

## 2013-08-02 DIAGNOSIS — N2581 Secondary hyperparathyroidism of renal origin: Secondary | ICD-10-CM | POA: Diagnosis not present

## 2013-08-09 ENCOUNTER — Telehealth: Payer: Self-pay | Admitting: *Deleted

## 2013-08-09 NOTE — Telephone Encounter (Signed)
Called patient to alter fu visit for 08-12-13 due to Dr. Tammi Klippel being in an [facility] Case, rescheduled for 08-19-13 at 3:15 p.m., patient agreed to new time and date

## 2013-08-11 ENCOUNTER — Encounter (HOSPITAL_COMMUNITY): Payer: Self-pay

## 2013-08-11 ENCOUNTER — Ambulatory Visit (HOSPITAL_COMMUNITY)
Admission: RE | Admit: 2013-08-11 | Discharge: 2013-08-11 | Disposition: A | Discharge status: Home / Self Care | Payer: Medicare Other | Source: Ambulatory Visit | Attending: Radiation Oncology | Admitting: Radiation Oncology

## 2013-08-11 DIAGNOSIS — R634 Abnormal weight loss: Secondary | ICD-10-CM | POA: Diagnosis not present

## 2013-08-11 DIAGNOSIS — J479 Bronchiectasis, uncomplicated: Secondary | ICD-10-CM | POA: Insufficient documentation

## 2013-08-11 DIAGNOSIS — I700 Atherosclerosis of aorta: Secondary | ICD-10-CM | POA: Insufficient documentation

## 2013-08-11 DIAGNOSIS — R059 Cough, unspecified: Secondary | ICD-10-CM | POA: Insufficient documentation

## 2013-08-11 DIAGNOSIS — R0602 Shortness of breath: Secondary | ICD-10-CM | POA: Insufficient documentation

## 2013-08-11 DIAGNOSIS — I2510 Atherosclerotic heart disease of native coronary artery without angina pectoris: Secondary | ICD-10-CM | POA: Insufficient documentation

## 2013-08-11 DIAGNOSIS — C3490 Malignant neoplasm of unspecified part of unspecified bronchus or lung: Secondary | ICD-10-CM | POA: Insufficient documentation

## 2013-08-11 DIAGNOSIS — Z923 Personal history of irradiation: Secondary | ICD-10-CM | POA: Insufficient documentation

## 2013-08-11 DIAGNOSIS — R05 Cough: Secondary | ICD-10-CM | POA: Insufficient documentation

## 2013-08-11 HISTORY — DX: Malignant (primary) neoplasm, unspecified: C80.1

## 2013-08-12 ENCOUNTER — Ambulatory Visit: Payer: Medicare Other | Admitting: Radiation Oncology

## 2013-08-19 ENCOUNTER — Encounter: Payer: Self-pay | Admitting: Radiation Oncology

## 2013-08-19 ENCOUNTER — Ambulatory Visit
Admission: RE | Admit: 2013-08-19 | Discharge: 2013-08-19 | Disposition: A | Discharge status: Home / Self Care | Payer: Medicare Other | Source: Ambulatory Visit | Attending: Radiation Oncology | Admitting: Radiation Oncology

## 2013-08-19 VITALS — BP 129/76 | HR 55 | Temp 97.6°F | Resp 20 | Wt 119.0 lb

## 2013-08-19 DIAGNOSIS — C3492 Malignant neoplasm of unspecified part of left bronchus or lung: Secondary | ICD-10-CM

## 2013-08-19 NOTE — Progress Notes (Signed)
Pt denies pain except her chronic arthritic left knee pain for which she takes Hydrocodone prn. Pt reports dry cough which she attributes to sinus/allergy issues. She has SOB w/exertion. She states her appetite is improving, energy level is her baseline.

## 2013-08-19 NOTE — Progress Notes (Signed)
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  She has some low back pain with bending, lifting, and certain positions.  Otherwise she is without complaint.                              ALLERGIES:  is allergic to codeine and sulfamethoxazole w-trimethoprim.  Meds: Current Outpatient Prescriptions  Medication Sig Dispense Refill  . acetaminophen (TYLENOL) 325 MG tablet Take 650 mg by mouth every 6 (six) hours as needed for pain.      [name] amLODipine (NORVASC) 5 MG tablet Take 5 mg by mouth at bedtime.      [name] atenolol (TENORMIN) 50 MG tablet Take 1 tablet by mouth daily.      . cholecalciferol (VITAMIN D) 1000 UNITS tablet Take 1,000 Units by mouth daily.      . furosemide (LASIX) 20 MG tablet Take 20 mg by mouth daily.      [name] HYDROcodone-acetaminophen (VICODIN) 5-325 mg TABS Take 6 tablets by mouth every 4 (four) hours as needed.  60 tablet  5  . levothyroxine (SYNTHROID, LEVOTHROID) 50 MCG tablet Take 1 tablet by mouth daily.      [name] lisinopril (PRINIVIL,ZESTRIL) 20 MG tablet Take 1 tablet by mouth daily.       No current facility-administered medications for this encounter.    Physical Findings: The patient is in no acute distress. Patient is alert and oriented.  weight is 119 lb (53.978 kg). Her oral temperature is 97.6 F (36.4 C). Her blood pressure is 129/76 and her pulse is 55. Her respiration is 20 and oxygen saturation is 98%. .  No significant changes.  Lab Findings: Lab Results  Component Value Date   WBC 9.5 12/03/2012   HGB 12.5  12/03/2012   HCT 37.4 12/03/2012   MCV 96.2 12/03/2012   PLT 245.0 12/03/2012    @LASTCHEM @  Radiographic Findings: Ct Chest Wo Contrast  08/11/2013   CLINICAL DATA:  Lung cancer diagnosed in January 2014 status post partial lung resection and completion of radiation therapy. Patient reports cough, shortness of breath and weight loss.  EXAM: CT CHEST WITHOUT CONTRAST  TECHNIQUE: Multidetector CT imaging of the chest was performed following the standard protocol without IV contrast.  COMPARISON:  Chest CT 10/13/2012 and 04/22/2013. PET-CT 12/21/2012.  FINDINGS: There is progressive volume loss, architectural distortion, bronchiectasis and peribronchial opacity in left upper lobe and superior segment of the left lower lobe, most consistent with evolving radiation change. No focal mass or endobronchial lesion is demonstrated.  Postsurgical changes in the right hemi thorax status post upper lobe resection and minimal nodularity on image number 29 are stable. There is no pleural or pericardial effusion.  No pathologically enlarged mediastinal or hilar lymph nodes are identified on non contrast imaging. There is stable relatively mild atherosclerosis of the aorta, great vessels and coronary arteries.  The visualized upper abdomen appears stable. There is mild left renal atrophy. No worrisome osseous findings are demonstrated.  IMPRESSION: Further progression in changes in the superior aspect of the left hemithorax most consistent with radiation pneumonitis/fibrosis. No specific evidence of recurrent tumor or metastatic disease.   Electronically Signed   By: Camie Patience   On: 08/11/2013 13:05    Impression:  The patient is recovering from the effects of radiation.  She has some back pain which seems to be degenerative by clinical symptoms and this correlates with degenerative lumbar changes seen on PET prior to treatment.  She has no evidence of recurrence.  Plan:  Chest CT in 6 months, then,  follow-up.  _____________________________________  Sheral Apley. Tammi Klippel, M.D.

## 2013-08-20 ENCOUNTER — Telehealth: Payer: Self-pay | Admitting: *Deleted

## 2013-08-20 NOTE — Telephone Encounter (Signed)
CALLED PATIENT TO INFORM OF TEST AND FU VISIT FOR  MARCH 2015, NO ANSWER, MAILED APPTS. CARDS.

## 2013-09-03 ENCOUNTER — Telehealth: Payer: Self-pay | Admitting: Internal Medicine

## 2013-09-03 NOTE — Telephone Encounter (Signed)
ATC #. Received message mailbox is full and can not leave a message [facility]

## 2013-09-06 MED ORDER — PREDNISONE 10 MG PO TABS: ORAL_TABLET | ORAL | Status: DC

## 2013-09-06 NOTE — Telephone Encounter (Signed)
Spoke with April; aware that Rx has been sent to [facility] and OV made with [facility] tomorrow afternoon at 4:00pm; offered today with TP and CY tomorrow at 10:45am but patient could not come in either times.

## 2013-09-06 NOTE — Telephone Encounter (Signed)
Spoke with pt's daughter.  Reports pt having non prod cough for 2 weeks that isnt getting better.  Discomfort in chest.  Mild SOB.  Denies fever.  Not taking anything otc.  Pt seen oncologist 2 weeks and mentioned cough and they had ? Pneumonitis but no treatment was given.  Please advise.  Last ov: 12-03-12 Allergies  Allergen Reactions  . Codeine   . Sulfamethoxazole-Trimethoprim     REACTION: rash

## 2013-09-06 NOTE — Telephone Encounter (Signed)
Offer predisone 8 day taper  10 mg, # 20, 4 X 2 DAYS, 3 X 2 DAYS, 2 X 2 DAYS, 1 X 2 DAYS ROV as able- me, [place] or [place]

## 2013-09-07 ENCOUNTER — Telehealth: Payer: Self-pay | Admitting: *Deleted

## 2013-09-07 ENCOUNTER — Ambulatory Visit (INDEPENDENT_AMBULATORY_CARE_PROVIDER_SITE_OTHER): Payer: Medicare Other | Admitting: Pulmonary Disease

## 2013-09-07 ENCOUNTER — Encounter: Payer: Self-pay | Admitting: Pulmonary Disease

## 2013-09-07 VITALS — BP 94/58 | HR 71 | Temp 98.3°F | Ht 64.0 in | Wt 118.6 lb

## 2013-09-07 DIAGNOSIS — R05 Cough: Secondary | ICD-10-CM

## 2013-09-07 DIAGNOSIS — R059 Cough, unspecified: Secondary | ICD-10-CM | POA: Diagnosis not present

## 2013-09-07 MED ORDER — CETIRIZINE HCL 10 MG PO TABS: 10.0000 mg | ORAL_TABLET | Freq: Every day | ORAL | Status: DC

## 2013-09-07 NOTE — Assessment & Plan Note (Addendum)
The patient has a persistent cough that is primarily dry in nature, and sometimes associated with increasing dyspnea on exertion.  The patient has a lot of reasons to have cough, including taking an ACE inhibitor, finishing recent radiation for lung cancer, post nasal drip, and underlying mild COPD.  At this point, I would like to address each of these issues to see if her symptoms resolve.  We'll treat her with a short course of prednisone, and change her ACE inhibitor to an arb.  If she continues to have issues, will need to have a chest x-ray to see if this is indeed worsening radiation pneumonitis.  At that point, she may benefit from a more prolonged steroid taper? The patient has someone who checks on her 3 times a week and takes her vital signs, and can monitor her blood pressure with the medication change.

## 2013-09-07 NOTE — Telephone Encounter (Signed)
She is going thru a very difficult time in her life right now, and suspect is leading to insomnia at night, which then causes her to be sleepy during the day.  I would recommend that she speak with her primary md to see about a medication very short term to get thru this period to help her sleep.  In the meantime, she must avoid sleeping during the day at all cost to get herself back on schedule.

## 2013-09-07 NOTE — Addendum Note (Signed)
Addended by: Virl Cagey on: 09/07/2013 04:52 PM   Modules accepted: Orders

## 2013-09-07 NOTE — Telephone Encounter (Signed)
Patient seen in office today for acute office visit-- Question regarding her sleep issues. Pt states that about 17mo ago her sleeping habits have worsened. Pt reports difficulty falling off to sleep and staying sleep. States that she only sleeps 1-2 hours per night x the last 72mo. Would like any recs on helping resolve this issue or ideas where it could be coming from.   Please advise Dr Gwenette Greet. Thanks.

## 2013-09-07 NOTE — Progress Notes (Signed)
  Subjective:    Patient ID: Valerie Young, female    DOB: [birth year], 79 y.o.   MRN: [id number]  HPI Patient comes in today for an acute sick visit.  She has known mild COPD, and is status post radiation therapy for lung cancer.  She has had a dry chronic cough since finishing her treatment in April, but more recently has had worsening with some increased shortness of breath.  The cough is dry in nature, but she has also had some increased shortness of breath recently.  It should be noted the patient is on lisinopril, and is at risk for radiation pneumonitis.  She also has underlying COPD for which she is not on treatment currently.   Review of Systems  Constitutional: Positive for appetite change and fatigue. Negative for fever and unexpected weight change.  HENT: Negative for congestion, dental problem, ear pain, nosebleeds, postnasal drip, rhinorrhea, sinus pressure, sneezing, sore throat and trouble swallowing.   Eyes: Negative for redness and itching.  Respiratory: Positive for cough and shortness of breath. Negative for chest tightness and wheezing.   Cardiovascular: Negative for palpitations and leg swelling.  Gastrointestinal: Negative for nausea and vomiting.  Genitourinary: Negative for dysuria.  Musculoskeletal: Positive for back pain. Negative for joint swelling.  Skin: Negative for rash.  Neurological: Negative for headaches.  Hematological: Does not bruise/bleed easily.  Psychiatric/Behavioral: Negative for dysphoric mood. The patient is not nervous/anxious.        Objective:   Physical Exam Thin female in no acute distress Nose without purulence or discharge noted Oropharynx clear Neck without lymphadenopathy or thyromegaly Chest with decreased breath sounds throughout, no wheezes or crackles Cardiac exam with regular rate and rhythm Lower extremities without edema, no cyanosis Alert and oriented, moves all 4 extremities.       Assessment & Plan:

## 2013-09-07 NOTE — Patient Instructions (Signed)
Start the prednisone taper that was called in for you. Stop lisinopril, and take benicar 20mg   1/2 tab each day in its place (use pill cutter if needed) Take zyrtec 10mg  one each night at bedtime for postnasal drip Albuterol inhaler 2 puffs up to every 6hrs as needed for shortness of breath. followup with me in 3 weeks, but call if not getting better.

## 2013-09-09 NOTE — Telephone Encounter (Signed)
ATC x 2 both numbers mailbox not set up on one # and mailbox full on the other.

## 2013-09-10 NOTE — Telephone Encounter (Signed)
ATC x3 NA MyChart msg to be sent to patient to contact our office.   Dear Ms. Valerie Young,  We have been unable to reach you by telephone regarding your last office visit. I have made multiple attempts to contact you via phone and have been unsuccessful.  Dr Gwenette Greet has responded to your question/concerns, please see below:    Kathee Delton, MD Physician Signed  Service date: 09/07/2013 5:53 PM    She is going thru a very difficult time in her life right now, and suspect is leading to insomnia at night, which then causes her to be sleepy during the day. I would recommend that she speak with her primary md to see about a medication very short term to get thru this period to help her sleep. In the meantime, she must avoid sleeping during the day at all cost to get herself back on schedule.         Virl Cagey, [place] Certified Medical Assistant Signed  Service date: 09/07/2013 4:54 PM    Patient seen in office today for acute office visit--  Question regarding her sleep issues.  Pt states that about 45mo ago her sleeping habits have worsened.  Pt reports difficulty falling off to sleep and staying sleep. States that she only sleeps 1-2 hours per night x the last 53mo.  Would like any recs on helping resolve this issue or ideas where it could be coming from.  Please advise Dr Gwenette Greet. Thanks.

## 2013-09-23 DIAGNOSIS — N390 Urinary tract infection, site not specified: Secondary | ICD-10-CM | POA: Diagnosis not present

## 2013-09-23 DIAGNOSIS — M545 Low back pain, unspecified: Secondary | ICD-10-CM | POA: Diagnosis not present

## 2013-09-23 DIAGNOSIS — R350 Frequency of micturition: Secondary | ICD-10-CM | POA: Diagnosis not present

## 2013-09-23 DIAGNOSIS — R3915 Urgency of urination: Secondary | ICD-10-CM | POA: Diagnosis not present

## 2013-09-24 DIAGNOSIS — R319 Hematuria, unspecified: Secondary | ICD-10-CM | POA: Diagnosis not present

## 2013-09-28 ENCOUNTER — Ambulatory Visit (INDEPENDENT_AMBULATORY_CARE_PROVIDER_SITE_OTHER): Payer: Medicare Other | Admitting: Pulmonary Disease

## 2013-09-28 ENCOUNTER — Encounter: Payer: Self-pay | Admitting: Pulmonary Disease

## 2013-09-28 VITALS — BP 128/80 | HR 66 | Temp 97.5°F | Ht 64.0 in | Wt 120.4 lb

## 2013-09-28 DIAGNOSIS — R05 Cough: Secondary | ICD-10-CM

## 2013-09-28 DIAGNOSIS — J700 Acute pulmonary manifestations due to radiation: Secondary | ICD-10-CM

## 2013-09-28 DIAGNOSIS — R059 Cough, unspecified: Secondary | ICD-10-CM

## 2013-09-28 DIAGNOSIS — Z23 Encounter for immunization: Secondary | ICD-10-CM | POA: Diagnosis not present

## 2013-09-28 MED ORDER — PREDNISONE 10 MG PO TABS: ORAL_TABLET | ORAL | Status: DC

## 2013-09-28 NOTE — Assessment & Plan Note (Signed)
The patient's cough is significantly improved after getting off her ACE inhibitor, and also with the course of prednisone.  She did see a return of her cough since being off prednisone, but nowhere near her prior level.  I am wondering if she has underlying radiation pneumonitis in light of her CT changes in September.  Given this, I would like to get her back on prednisone and taper her quickly to 10 mg a day, for the next 4 weeks.  I will check a chest x-ray at the next visit to reevaluate.

## 2013-09-28 NOTE — Progress Notes (Signed)
  Subjective:    Patient ID: Valerie Young, female    DOB: [birth year], 79 y.o.   MRN: [id number]  HPI Patient comes in today for followup of her cough.  This was felt secondary to an ACE inhibitor, and also possibly do to radiation pneumonitis.  She was changed to an ARB, and also treated with a course of prednisone.  She has seen significant improvement in her cough, but it started to return once her prednisone was discontinued.  She does not feel that she is significantly short of breath, nor is she producing mucus.   Review of Systems  Constitutional: Negative for fever and unexpected weight change.  HENT: Negative for congestion, dental problem, ear pain, nosebleeds, postnasal drip, rhinorrhea, sinus pressure, sneezing, sore throat and trouble swallowing.   Eyes: Negative for redness and itching.  Respiratory: Positive for cough. Negative for chest tightness, shortness of breath and wheezing.   Cardiovascular: Negative for palpitations and leg swelling.  Gastrointestinal: Negative for nausea and vomiting.  Genitourinary: Negative for dysuria.  Musculoskeletal: Negative for joint swelling.  Skin: Negative for rash.  Neurological: Negative for headaches.  Hematological: Does not bruise/bleed easily.  Psychiatric/Behavioral: Negative for dysphoric mood. The patient is not nervous/anxious.        Objective:   Physical Exam Thin female in no acute distress Nose without purulence or discharge noted Neck without lymphadenopathy or thyromegaly Chest with a few scattered crackles, and mildly decreased breath sounds, no wheezing Cardiac exam with regular rate and rhythm Lower extremities without edema, no cyanosis Alert and oriented, moves all 4 extremities.       Assessment & Plan:

## 2013-09-28 NOTE — Addendum Note (Signed)
Addended by: Virl Cagey on: 09/28/2013 05:36 PM   Modules accepted: Orders

## 2013-09-28 NOTE — Patient Instructions (Signed)
Continue benicar 10mg  once a day.  However, you need to get with your primary to look at long term treatment options. Will start back on prednisone for possible radiation pneumonitis.  Take 40mg  each day for 3 days, then 30mg  each day for 3 days, then 20mg  each day for 3 days, then 10mg  each day until next visit in 4 weeks.  Will do chest xray same day as next visit. followup with me in 4 weeks.

## 2013-10-08 ENCOUNTER — Telehealth: Payer: Self-pay | Admitting: Pulmonary Disease

## 2013-10-08 NOTE — Telephone Encounter (Signed)
I called and spoke with pt. She reports she is wanting something called in for shakes she has in her hands. I advised her [place] is her pulmonologist's and does not prescribe PCP medications. She voiced her understanding and needed nothing further

## 2013-11-05 ENCOUNTER — Ambulatory Visit (INDEPENDENT_AMBULATORY_CARE_PROVIDER_SITE_OTHER): Payer: Medicare Other | Admitting: Pulmonary Disease

## 2013-11-05 ENCOUNTER — Ambulatory Visit (INDEPENDENT_AMBULATORY_CARE_PROVIDER_SITE_OTHER)
Admission: RE | Admit: 2013-11-05 | Discharge: 2013-11-05 | Disposition: A | Discharge status: Home / Self Care | Payer: Medicare Other | Source: Ambulatory Visit | Attending: Pulmonary Disease | Admitting: Pulmonary Disease

## 2013-11-05 ENCOUNTER — Encounter: Payer: Self-pay | Admitting: Pulmonary Disease

## 2013-11-05 VITALS — BP 132/78 | HR 84 | Temp 98.2°F | Ht 64.0 in | Wt 123.0 lb

## 2013-11-05 DIAGNOSIS — R059 Cough, unspecified: Secondary | ICD-10-CM | POA: Diagnosis not present

## 2013-11-05 DIAGNOSIS — R05 Cough: Secondary | ICD-10-CM

## 2013-11-05 DIAGNOSIS — J700 Acute pulmonary manifestations due to radiation: Secondary | ICD-10-CM | POA: Diagnosis not present

## 2013-11-05 DIAGNOSIS — C3490 Malignant neoplasm of unspecified part of unspecified bronchus or lung: Secondary | ICD-10-CM | POA: Diagnosis not present

## 2013-11-05 MED ORDER — PREDNISONE 5 MG PO TABS: ORAL_TABLET | ORAL | Status: DC

## 2013-11-05 NOTE — Addendum Note (Signed)
Addended by: Virl Cagey on: 11/05/2013 04:10 PM   Modules accepted: Orders

## 2013-11-05 NOTE — Patient Instructions (Signed)
Stay on benicar 20mg , take 1/2 tab a day until you see your new primary doctor Will start back on prednisone 10mg  each day for 3 days, then 5mg  each day for 3 days, then 2.5mg  each day for 5 days, then stop. Please call if your cough or other symptoms come back. followup with me in 39mos if doing well.

## 2013-11-05 NOTE — Progress Notes (Signed)
   Subjective:    Patient ID: Valerie Young, female    DOB: June 27, 1929, 79 y.o.   MRN: [id number]  HPI The patient comes in today for followup of her chronic cough. This was felt to be secondary to an ACE inhibitor, and also probable radiation pneumonitis. She was changed to an ARB, and was also treated with a course of prednisone. She had significant improvement in her cough, but then it recurred when prednisone was tapered. She was most recently treated with a 4 week course, and comes in today where her cough has totally resolved. She was supposed to stay on 10 mg a day, but ran out of medication 2 days ago. She feels that her breathing is not an issue, and states that her cough is doing well.   Review of Systems  Constitutional: Negative for fever and unexpected weight change.  HENT: Negative for congestion, dental problem, ear pain, nosebleeds, postnasal drip, rhinorrhea, sinus pressure, sneezing, sore throat and trouble swallowing.   Eyes: Negative for redness and itching.  Respiratory: Positive for cough. Negative for chest tightness, shortness of breath and wheezing.   Cardiovascular: Negative for palpitations and leg swelling.  Gastrointestinal: Negative for nausea and vomiting.  Genitourinary: Negative for dysuria.  Musculoskeletal: Negative for joint swelling.  Skin: Negative for rash.  Neurological: Negative for headaches.  Hematological: Does not bruise/bleed easily.  Psychiatric/Behavioral: Negative for dysphoric mood. The patient is not nervous/anxious.        Objective:   Physical Exam Well-developed female in no acute distress Nose without purulence or discharge noted Neck without lymphadenopathy or thyromegaly Chest with a few rhonchi in the left upper lung zone, otherwise clear Cardiac exam with regular rate and rhythm Lower extremities without edema, no cyanosis Alert and oriented, moves all 4 extremities.       Assessment & Plan:

## 2013-11-05 NOTE — Assessment & Plan Note (Signed)
The patient has responded very well to prednisone, with total resolution of her cough. I would like to get her back on 10 mg a day, and taper her off over the next 10 days. Hopefully her cough will not recur this time around.

## 2013-11-15 ENCOUNTER — Ambulatory Visit: Payer: Medicare Other | Attending: Internal Medicine | Admitting: Internal Medicine

## 2013-11-15 ENCOUNTER — Encounter: Payer: Self-pay | Admitting: Internal Medicine

## 2013-11-15 VITALS — BP 121/71 | HR 76 | Temp 99.3°F | Resp 16 | Ht 64.0 in | Wt 124.0 lb

## 2013-11-15 DIAGNOSIS — I10 Essential (primary) hypertension: Secondary | ICD-10-CM

## 2013-11-15 DIAGNOSIS — N184 Chronic kidney disease, stage 4 (severe): Secondary | ICD-10-CM | POA: Diagnosis not present

## 2013-11-15 DIAGNOSIS — C3490 Malignant neoplasm of unspecified part of unspecified bronchus or lung: Secondary | ICD-10-CM | POA: Diagnosis not present

## 2013-11-15 DIAGNOSIS — M1710 Unilateral primary osteoarthritis, unspecified knee: Secondary | ICD-10-CM | POA: Diagnosis not present

## 2013-11-15 LAB — CBC WITH DIFFERENTIAL/PLATELET
Lymphocytes Relative: 12 % (ref 12–46)
Lymphs Abs: 1 10*3/uL (ref 0.7–4.0)
MCV: 93.5 fL (ref 78.0–100.0)
Neutrophils Relative %: 76 % (ref 43–77)
Platelets: 246 10*3/uL (ref 150–400)
RBC: 3.86 MIL/uL — ABNORMAL LOW (ref 3.87–5.11)
WBC: 8.1 10*3/uL (ref 4.0–10.5)

## 2013-11-15 LAB — CMP AND LIVER
AST: 17 U/L (ref 0–37)
BUN: 28 mg/dL — ABNORMAL HIGH (ref 6–23)
Bilirubin, Direct: 0.1 mg/dL (ref 0.0–0.3)
Calcium: 9.5 mg/dL (ref 8.4–10.5)
Chloride: 100 mEq/L (ref 96–112)
Creat: 1.55 mg/dL — ABNORMAL HIGH (ref 0.50–1.10)

## 2013-11-15 MED ORDER — ACETAMINOPHEN-CODEINE #3 300-30 MG PO TABS: 1.0000 | ORAL_TABLET | ORAL | Status: DC | PRN

## 2013-11-15 MED ORDER — OLMESARTAN MEDOXOMIL 20 MG PO TABS: 10.0000 mg | ORAL_TABLET | Freq: Every day | ORAL | Status: DC

## 2013-11-15 NOTE — Patient Instructions (Signed)

## 2013-11-15 NOTE — Progress Notes (Signed)
Patient ID: Valerie Young, female   DOB: 1929/06/07, 79 y.o.   MRN: [id number] Patient Demographics  Valerie Young, is a 79 y.o. female  [id number]  [id number]  DOB - Jun 02, 1929  CC:  Chief Complaint  Patient presents with  . Establish Care       HPI: Valerie Young is a 78 y.o. female here today to establish medical care. She is a former smoker with history of right upper lobectomy for cancer in May of 1981 in [place], without radiation or chemotherapy. She was treated for urinary infection in November of 2013. At that time, chest x-ray showed increase in a chronic left upper lobe density. CT images from November 2013 showed marked progression since 2005 of an infiltrate in the left upper lobe, starting peripherally and extending to the left hilum. Bronch on 1/14 revealed adenocarcinoma. PET CT on 1/27 showed malignant level activity in the left upper lung. 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 She also has history of COPD, hypertension, hypothyroidism and chronic kidney disease. She is here to establish care. Her major complaint is ongoing pain in both of her knees, she has had osteoarthritis of both knees for a long-time but she has limited options for medication because of chronic kidney disease and codeine allergy. She is on different medications as listed below.  Patient has No headache, No chest pain, No abdominal pain - No Nausea, No new weakness tingling or numbness, No Cough - SOB.  Allergies  Allergen Reactions  . Codeine   . Sulfamethoxazole-Trimethoprim     REACTION: rash   Past Medical History  Diagnosis Date  . Hypertension   . Hypothyroidism   . COPD (chronic obstructive pulmonary disease)   . DJD (degenerative joint disease)     knees  . PND (post-nasal drip)     Chronic  . H/O: lung cancer 1982    Right; treated with resection  . Cancer     right lung  ca - 30 years ago / left lung - 11/2012   Current Outpatient Prescriptions on File Prior to Visit  Medication Sig Dispense Refill  . albuterol (PROVENTIL HFA;VENTOLIN HFA) 108 (90 BASE) MCG/ACT inhaler Inhale 2 puffs into the lungs every 6 (six) hours as needed for wheezing or shortness of breath.      [name] atenolol (TENORMIN) 50 MG tablet Take 1 tablet by mouth daily.      . cetirizine (ZYRTEC ALLERGY) 10 MG tablet Take 1 tablet (10 mg total) by mouth at bedtime.  30 tablet  6  . cholecalciferol (VITAMIN D) 1000 UNITS tablet Take 1,000 Units by mouth daily.      . furosemide (LASIX) 20 MG tablet Take 20 mg by mouth daily.      [name] levothyroxine (SYNTHROID, LEVOTHROID) 50 MCG tablet Take 1 tablet by mouth daily.      . predniSONE (DELTASONE) 5 MG tablet Take 2 tabs po x 3 days, then 1 x 3 days, then 1/2 x 5 days then stop.  12 tablet  0   No current facility-administered medications on file prior to visit.   Family History  Problem Relation Age of Onset  . CVA Mother 75    hemorrhagic    History   Social History  . Marital Status: Divorced    Spouse Name: N/A    Number of Children: N/A  . Years of Education: N/A   Occupational  History  . Retired    Social History Main Topics  . Smoking status: Former Smoker -- 1.00 packs/day for 35 years    Types: Cigarettes    Quit date: 11/25/1980  . Smokeless tobacco: Not on file  . Alcohol Use: No  . Drug Use: No  . Sexual Activity: Not on file   Other Topics Concern  . Not on file   Social History Narrative  . No narrative on file    Review of Systems: Constitutional: Negative for fever, chills, diaphoresis, activity change, appetite change and fatigue. HENT: Negative for ear pain, nosebleeds, congestion, facial swelling, rhinorrhea, neck pain, neck stiffness and ear discharge.  Eyes: Negative for pain, discharge, redness, itching and visual disturbance. Respiratory: ++ cough, no choking or chest tightness, ++shortness of breath  occasionally  Cardiovascular: Negative for chest pain, palpitations and leg swelling. Gastrointestinal: Negative for abdominal distention. Genitourinary: Negative for dysuria, urgency, frequency, hematuria, flank pain, decreased urine volume, difficulty urinating and dyspareunia.  Musculoskeletal: Negative for back pain, joint swelling, arthralgia and gait problem. Neurological: Negative for dizziness, tremors, seizures, syncope, facial asymmetry, speech difficulty, weakness, light-headedness, numbness and headaches.  Hematological: Negative for adenopathy. Does not bruise/bleed easily. Psychiatric/Behavioral: Negative for hallucinations, behavioral problems, confusion, dysphoric mood, decreased concentration and agitation.    Objective:   Filed Vitals:   11/15/13 1812  BP: 121/71  Pulse: 76  Temp: 99.3 F (37.4 C)  Resp: 16    Physical Exam: Constitutional: Patient appears well-developed and well-nourished. No distress. HENT: Normocephalic, atraumatic, External right and left ear normal. Oropharynx is clear and moist.  Eyes: Conjunctivae and EOM are normal. PERRLA, no scleral icterus. Neck: Normal ROM. Neck supple. No JVD. No tracheal deviation. No thyromegaly. CVS: RRR, S1/S2 +, no murmurs, no gallops, no carotid bruit.  Pulmonary: Effort and breath sounds normal, no stridor, rhonchi, wheezes, rales.  Abdominal: Soft. BS +, no distension, tenderness, rebound or guarding.  Musculoskeletal: Normal range of motion. No edema and no tenderness.  Lymphadenopathy: No lymphadenopathy noted, cervical, inguinal or axillary Neuro: Alert. Normal reflexes, muscle tone coordination. No cranial nerve deficit. Skin: Skin is warm and dry. No rash noted. Not diaphoretic. No erythema. No pallor. Psychiatric: Normal mood and affect. Behavior, judgment, thought content normal.  Lab Results  Component Value Date   WBC 9.5 12/03/2012   HGB 12.5 12/03/2012   HCT 37.4 12/03/2012   MCV 96.2 12/03/2012   PLT  245.0 12/03/2012   Lab Results  Component Value Date   CREATININE 1.4* 12/03/2012   BUN 28* 12/03/2012   NA 138 12/03/2012   K 3.6 12/03/2012   CL 102 12/03/2012   CO2 28 12/03/2012    No results found for this basename: HGBA1C   Lipid Panel  No results found for this basename: chol, trig, hdl, cholhdl, vldl, ldlcalc       Assessment and plan:   1. Chronic kidney disease (CKD), stage IV (severe) Will check - CMP and Liver - CBC with Differential  2. Arthritis of knee Prescribe,  - acetaminophen-codeine (TYLENOL #3) 300-30 MG per tablet; Take 1 tablet by mouth every 4 (four) hours as needed for moderate pain.  Dispense: 30 tablet; Refill: 0 The patient and Patient's daughter who is the caregiver and was present during this encounter were both counseled on Tylenol No. 3 and possibility of allergic reaction, she was instructed on the immediate actions to take should she develop allergic reaction.  3. Lung cancer, left Status post radiation therapy  4.  Essential hypertension, benign Continue - olmesartan (BENICAR) 20 MG tablet; Take 0.5 tablets (10 mg total) by mouth daily.  Dispense: 60 tablet; Refill: 2   Follow up in 4 weeks or when necessary   The patient was given clear instructions to go to ER or return to medical center if symptoms don't improve, worsen or new problems develop. The patient verbalized understanding. The patient was told to call to get lab results if they haven't heard anything in the next week.     Angelica Chessman, MD, [place], [place], [place], [place]   11/15/2013, 6:36 PM

## 2013-11-15 NOTE — Progress Notes (Signed)
Pt is here to establish care. Pt is starting to feel like she is having flu like symptoms. Pt reports having arthritis in her left knee.

## 2013-11-22 ENCOUNTER — Encounter (HOSPITAL_COMMUNITY): Payer: Self-pay | Admitting: Emergency Medicine

## 2013-11-22 ENCOUNTER — Inpatient Hospital Stay (HOSPITAL_COMMUNITY): Payer: Medicare Other

## 2013-11-22 ENCOUNTER — Emergency Department (HOSPITAL_COMMUNITY): Payer: Medicare Other

## 2013-11-22 ENCOUNTER — Inpatient Hospital Stay (HOSPITAL_COMMUNITY)
Admission: EM | Admit: 2013-11-22 | Discharge: 2013-11-25 | DRG: 175 | Disposition: A | Discharge status: Skilled Nursing Facility | Payer: Medicare Other | Attending: Internal Medicine | Admitting: Internal Medicine

## 2013-11-22 DIAGNOSIS — R7309 Other abnormal glucose: Secondary | ICD-10-CM | POA: Diagnosis present

## 2013-11-22 DIAGNOSIS — I82401 Acute embolism and thrombosis of unspecified deep veins of right lower extremity: Secondary | ICD-10-CM

## 2013-11-22 DIAGNOSIS — C3490 Malignant neoplasm of unspecified part of unspecified bronchus or lung: Secondary | ICD-10-CM | POA: Diagnosis not present

## 2013-11-22 DIAGNOSIS — D638 Anemia in other chronic diseases classified elsewhere: Secondary | ICD-10-CM | POA: Diagnosis not present

## 2013-11-22 DIAGNOSIS — M1710 Unilateral primary osteoarthritis, unspecified knee: Secondary | ICD-10-CM | POA: Diagnosis present

## 2013-11-22 DIAGNOSIS — Z96659 Presence of unspecified artificial knee joint: Secondary | ICD-10-CM | POA: Diagnosis not present

## 2013-11-22 DIAGNOSIS — J8410 Pulmonary fibrosis, unspecified: Secondary | ICD-10-CM | POA: Diagnosis not present

## 2013-11-22 DIAGNOSIS — I824Z9 Acute embolism and thrombosis of unspecified deep veins of unspecified distal lower extremity: Secondary | ICD-10-CM | POA: Diagnosis present

## 2013-11-22 DIAGNOSIS — N184 Chronic kidney disease, stage 4 (severe): Secondary | ICD-10-CM | POA: Diagnosis present

## 2013-11-22 DIAGNOSIS — J9600 Acute respiratory failure, unspecified whether with hypoxia or hypercapnia: Secondary | ICD-10-CM

## 2013-11-22 DIAGNOSIS — Z85118 Personal history of other malignant neoplasm of bronchus and lung: Secondary | ICD-10-CM | POA: Diagnosis not present

## 2013-11-22 DIAGNOSIS — R0902 Hypoxemia: Secondary | ICD-10-CM

## 2013-11-22 DIAGNOSIS — I129 Hypertensive chronic kidney disease with stage 1 through stage 4 chronic kidney disease, or unspecified chronic kidney disease: Secondary | ICD-10-CM | POA: Diagnosis present

## 2013-11-22 DIAGNOSIS — D72829 Elevated white blood cell count, unspecified: Secondary | ICD-10-CM | POA: Diagnosis present

## 2013-11-22 DIAGNOSIS — I2699 Other pulmonary embolism without acute cor pulmonale: Secondary | ICD-10-CM

## 2013-11-22 DIAGNOSIS — Z5189 Encounter for other specified aftercare: Secondary | ICD-10-CM | POA: Diagnosis not present

## 2013-11-22 DIAGNOSIS — Z87891 Personal history of nicotine dependence: Secondary | ICD-10-CM | POA: Diagnosis not present

## 2013-11-22 DIAGNOSIS — J479 Bronchiectasis, uncomplicated: Secondary | ICD-10-CM | POA: Diagnosis not present

## 2013-11-22 DIAGNOSIS — Z823 Family history of stroke: Secondary | ICD-10-CM

## 2013-11-22 DIAGNOSIS — E049 Nontoxic goiter, unspecified: Secondary | ICD-10-CM | POA: Diagnosis present

## 2013-11-22 DIAGNOSIS — J189 Pneumonia, unspecified organism: Secondary | ICD-10-CM | POA: Diagnosis not present

## 2013-11-22 DIAGNOSIS — R059 Cough, unspecified: Secondary | ICD-10-CM | POA: Diagnosis present

## 2013-11-22 DIAGNOSIS — I10 Essential (primary) hypertension: Secondary | ICD-10-CM | POA: Diagnosis not present

## 2013-11-22 DIAGNOSIS — J449 Chronic obstructive pulmonary disease, unspecified: Secondary | ICD-10-CM | POA: Diagnosis not present

## 2013-11-22 DIAGNOSIS — R5381 Other malaise: Secondary | ICD-10-CM | POA: Diagnosis present

## 2013-11-22 DIAGNOSIS — J9601 Acute respiratory failure with hypoxia: Secondary | ICD-10-CM

## 2013-11-22 DIAGNOSIS — Z923 Personal history of irradiation: Secondary | ICD-10-CM

## 2013-11-22 DIAGNOSIS — I82409 Acute embolism and thrombosis of unspecified deep veins of unspecified lower extremity: Secondary | ICD-10-CM | POA: Diagnosis not present

## 2013-11-22 DIAGNOSIS — D630 Anemia in neoplastic disease: Secondary | ICD-10-CM | POA: Diagnosis present

## 2013-11-22 DIAGNOSIS — R5383 Other fatigue: Secondary | ICD-10-CM | POA: Diagnosis present

## 2013-11-22 DIAGNOSIS — J441 Chronic obstructive pulmonary disease with (acute) exacerbation: Secondary | ICD-10-CM

## 2013-11-22 DIAGNOSIS — J439 Emphysema, unspecified: Secondary | ICD-10-CM | POA: Diagnosis present

## 2013-11-22 DIAGNOSIS — E039 Hypothyroidism, unspecified: Secondary | ICD-10-CM | POA: Diagnosis present

## 2013-11-22 DIAGNOSIS — IMO0001 Reserved for inherently not codable concepts without codable children: Secondary | ICD-10-CM | POA: Diagnosis present

## 2013-11-22 DIAGNOSIS — T380X5A Adverse effect of glucocorticoids and synthetic analogues, initial encounter: Secondary | ICD-10-CM | POA: Diagnosis present

## 2013-11-22 DIAGNOSIS — R05 Cough: Secondary | ICD-10-CM | POA: Diagnosis present

## 2013-11-22 DIAGNOSIS — I369 Nonrheumatic tricuspid valve disorder, unspecified: Secondary | ICD-10-CM | POA: Diagnosis not present

## 2013-11-22 DIAGNOSIS — R0602 Shortness of breath: Secondary | ICD-10-CM | POA: Diagnosis not present

## 2013-11-22 LAB — URINALYSIS, ROUTINE W REFLEX MICROSCOPIC
Leukocytes, UA: NEGATIVE
Nitrite: NEGATIVE
Specific Gravity, Urine: 1.015 (ref 1.005–1.030)
Urobilinogen, UA: 0.2 mg/dL (ref 0.0–1.0)
pH: 5.5 (ref 5.0–8.0)

## 2013-11-22 LAB — BLOOD GAS, ARTERIAL
Acid-base deficit: 2.7 mmol/L — ABNORMAL HIGH (ref 0.0–2.0)
Drawn by: 11249
O2 Content: 0.6 L/min
TCO2: 18.2 mmol/L (ref 0–100)
pCO2 arterial: 29.5 mmHg — ABNORMAL LOW (ref 35.0–45.0)
pH, Arterial: 7.447 (ref 7.350–7.450)
pO2, Arterial: 76.2 mmHg — ABNORMAL LOW (ref 80.0–100.0)

## 2013-11-22 LAB — CBC WITH DIFFERENTIAL/PLATELET
HCT: 34.4 % — ABNORMAL LOW (ref 36.0–46.0)
Hemoglobin: 11.5 g/dL — ABNORMAL LOW (ref 12.0–15.0)
Lymphocytes Relative: 6 % — ABNORMAL LOW (ref 12–46)
Monocytes Absolute: 1 10*3/uL (ref 0.1–1.0)
Monocytes Relative: 8 % (ref 3–12)
Neutro Abs: 11.9 10*3/uL — ABNORMAL HIGH (ref 1.7–7.7)
RBC: 3.55 MIL/uL — ABNORMAL LOW (ref 3.87–5.11)
WBC: 13.8 10*3/uL — ABNORMAL HIGH (ref 4.0–10.5)

## 2013-11-22 LAB — BASIC METABOLIC PANEL
BUN: 21 mg/dL (ref 6–23)
CO2: 21 mEq/L (ref 19–32)
Chloride: 100 mEq/L (ref 96–112)
Creatinine, Ser: 1.56 mg/dL — ABNORMAL HIGH (ref 0.50–1.10)

## 2013-11-22 LAB — CG4 I-STAT (LACTIC ACID): Lactic Acid, Venous: 2.48 mmol/L — ABNORMAL HIGH (ref 0.5–2.2)

## 2013-11-22 LAB — GLUCOSE, CAPILLARY
Glucose-Capillary: 307 mg/dL — ABNORMAL HIGH (ref 70–99)
Glucose-Capillary: 180 mg/dL — ABNORMAL HIGH (ref 70–99)

## 2013-11-22 LAB — INFLUENZA PANEL BY PCR (TYPE A & B)
H1N1 flu by pcr: NOT DETECTED
Influenza A By PCR: NEGATIVE

## 2013-11-22 LAB — APTT: aPTT: 32 seconds (ref 24–37)

## 2013-11-22 LAB — URINE MICROSCOPIC-ADD ON

## 2013-11-22 LAB — POCT I-STAT TROPONIN I: Troponin i, poc: 0.05 ng/mL (ref 0.00–0.08)

## 2013-11-22 MED ORDER — IPRATROPIUM BROMIDE 0.02 % IN SOLN
RESPIRATORY_TRACT | Status: AC
  Filled 2013-11-22: qty 2.5

## 2013-11-22 MED ORDER — ATENOLOL 50 MG PO TABS
50.0000 mg | ORAL_TABLET | Freq: Every day | ORAL | Status: DC
  Administered 2013-11-22 – 2013-11-25 (×4): 50 mg via ORAL
  Filled 2013-11-22 (×4): qty 1

## 2013-11-22 MED ORDER — HEPARIN (PORCINE) IN NACL 100-0.45 UNIT/ML-% IJ SOLN
900.0000 [IU]/h | INTRAMUSCULAR | Status: DC
  Administered 2013-11-22: 900 [IU]/h via INTRAVENOUS
  Filled 2013-11-22: qty 250

## 2013-11-22 MED ORDER — ALBUTEROL SULFATE (2.5 MG/3ML) 0.083% IN NEBU
2.5000 mg | INHALATION_SOLUTION | Freq: Four times a day (QID) | RESPIRATORY_TRACT | Status: DC
  Administered 2013-11-22 (×4): 2.5 mg via RESPIRATORY_TRACT
  Filled 2013-11-22 (×2): qty 0.5
  Filled 2013-11-22 (×5): qty 3

## 2013-11-22 MED ORDER — AZITHROMYCIN 500 MG PO TABS
500.0000 mg | ORAL_TABLET | Freq: Every day | ORAL | Status: DC
  Filled 2013-11-22: qty 1

## 2013-11-22 MED ORDER — AZITHROMYCIN 500 MG PO TABS
500.0000 mg | ORAL_TABLET | Freq: Every day | ORAL | Status: DC
  Administered 2013-11-22 – 2013-11-24 (×3): 500 mg via ORAL
  Filled 2013-11-22 (×4): qty 1

## 2013-11-22 MED ORDER — DEXTROSE 5 % IV SOLN
1.0000 g | INTRAVENOUS | Status: DC
  Filled 2013-11-22: qty 10

## 2013-11-22 MED ORDER — PREDNISONE 20 MG PO TABS
40.0000 mg | ORAL_TABLET | Freq: Every day | ORAL | Status: DC
  Administered 2013-11-23 – 2013-11-25 (×3): 40 mg via ORAL
  Filled 2013-11-22 (×4): qty 2

## 2013-11-22 MED ORDER — IPRATROPIUM BROMIDE 0.02 % IN SOLN
0.5000 mg | Freq: Once | RESPIRATORY_TRACT | Status: AC
  Administered 2013-11-22: 0.5 mg via RESPIRATORY_TRACT

## 2013-11-22 MED ORDER — TECHNETIUM TO 99M ALBUMIN AGGREGATED
5.0000 | Freq: Once | INTRAVENOUS | Status: AC | PRN
  Administered 2013-11-22: 5 via INTRAVENOUS

## 2013-11-22 MED ORDER — LEVOTHYROXINE SODIUM 50 MCG PO TABS
50.0000 ug | ORAL_TABLET | Freq: Every day | ORAL | Status: DC
  Administered 2013-11-22 – 2013-11-25 (×4): 50 ug via ORAL
  Filled 2013-11-22 (×5): qty 1

## 2013-11-22 MED ORDER — PREDNISONE 20 MG PO TABS
60.0000 mg | ORAL_TABLET | Freq: Once | ORAL | Status: AC
Start: 2013-11-22 — End: 2013-11-22
  Administered 2013-11-22: 60 mg via ORAL

## 2013-11-22 MED ORDER — ALBUTEROL SULFATE (5 MG/ML) 0.5% IN NEBU
INHALATION_SOLUTION | RESPIRATORY_TRACT | Status: AC
  Filled 2013-11-22: qty 0.5

## 2013-11-22 MED ORDER — ALBUTEROL SULFATE HFA 108 (90 BASE) MCG/ACT IN AERS: 2.0000 | INHALATION_SPRAY | Freq: Four times a day (QID) | RESPIRATORY_TRACT | Status: DC | PRN

## 2013-11-22 MED ORDER — TECHNETIUM TC 99M DIETHYLENETRIAME-PENTAACETIC ACID: 35.0000 | Freq: Once | INTRAVENOUS | Status: AC | PRN

## 2013-11-22 MED ORDER — IRBESARTAN 150 MG PO TABS
150.0000 mg | ORAL_TABLET | Freq: Every day | ORAL | Status: DC
  Administered 2013-11-22: 150 mg via ORAL
  Filled 2013-11-22: qty 1

## 2013-11-22 MED ORDER — AZITHROMYCIN 500 MG IV SOLR
500.0000 mg | Freq: Once | INTRAVENOUS | Status: AC
  Administered 2013-11-22: 500 mg via INTRAVENOUS
  Filled 2013-11-22: qty 500

## 2013-11-22 MED ORDER — OSELTAMIVIR PHOSPHATE 75 MG PO CAPS
75.0000 mg | ORAL_CAPSULE | Freq: Two times a day (BID) | ORAL | Status: DC
  Administered 2013-11-22 (×2): 75 mg via ORAL
  Filled 2013-11-22 (×3): qty 1

## 2013-11-22 MED ORDER — VITAMIN D3 25 MCG (1000 UNIT) PO TABS
1000.0000 [IU] | ORAL_TABLET | Freq: Every day | ORAL | Status: DC
  Administered 2013-11-22 – 2013-11-25 (×4): 1000 [IU] via ORAL
  Filled 2013-11-22 (×4): qty 1

## 2013-11-22 MED ORDER — INSULIN ASPART 100 UNIT/ML ~~LOC~~ SOLN
0.0000 [IU] | Freq: Three times a day (TID) | SUBCUTANEOUS | Status: DC
  Administered 2013-11-22: 3 [IU] via SUBCUTANEOUS
  Administered 2013-11-22: 2 [IU] via SUBCUTANEOUS
  Administered 2013-11-22: 7 [IU] via SUBCUTANEOUS

## 2013-11-22 MED ORDER — DEXTROSE 5 % IV SOLN: 1.0000 g | INTRAVENOUS | Status: DC

## 2013-11-22 MED ORDER — LORATADINE 10 MG PO TABS
10.0000 mg | ORAL_TABLET | Freq: Every day | ORAL | Status: DC
  Administered 2013-11-22 – 2013-11-25 (×4): 10 mg via ORAL
  Filled 2013-11-22 (×4): qty 1

## 2013-11-22 MED ORDER — DEXTROSE 5 % IV SOLN
1.0000 g | Freq: Once | INTRAVENOUS | Status: DC
  Filled 2013-11-22: qty 10

## 2013-11-22 MED ORDER — DEXTROSE 5 % IV SOLN
1.0000 g | Freq: Once | INTRAVENOUS | Status: AC
  Administered 2013-11-22: 1 g via INTRAVENOUS

## 2013-11-22 MED ORDER — VITAMINS A & D EX OINT
TOPICAL_OINTMENT | CUTANEOUS | Status: AC
  Administered 2013-11-22: 05:00:00
  Filled 2013-11-22: qty 5

## 2013-11-22 MED ORDER — HEPARIN SODIUM (PORCINE) 5000 UNIT/ML IJ SOLN
5000.0000 [IU] | Freq: Three times a day (TID) | INTRAMUSCULAR | Status: DC
  Administered 2013-11-22 (×2): 5000 [IU] via SUBCUTANEOUS
  Filled 2013-11-22 (×4): qty 1

## 2013-11-22 MED ORDER — ALBUTEROL SULFATE (5 MG/ML) 0.5% IN NEBU
5.0000 mg | INHALATION_SOLUTION | Freq: Once | RESPIRATORY_TRACT | Status: AC
  Administered 2013-11-22: 5 mg via RESPIRATORY_TRACT

## 2013-11-22 MED ORDER — PREDNISONE 20 MG PO TABS
ORAL_TABLET | ORAL | Status: AC
  Filled 2013-11-22: qty 3

## 2013-11-22 MED ORDER — IPRATROPIUM BROMIDE 0.02 % IN SOLN
0.5000 mg | Freq: Four times a day (QID) | RESPIRATORY_TRACT | Status: DC
  Administered 2013-11-22 (×4): 0.5 mg via RESPIRATORY_TRACT
  Filled 2013-11-22 (×4): qty 2.5

## 2013-11-22 MED ORDER — ENSURE COMPLETE PO LIQD
237.0000 mL | Freq: Two times a day (BID) | ORAL | Status: DC
  Administered 2013-11-22 – 2013-11-25 (×5): 237 mL via ORAL

## 2013-11-22 MED ORDER — DEXTROSE 5 % IV SOLN
1.0000 g | INTRAVENOUS | Status: DC
  Administered 2013-11-22 – 2013-11-24 (×3): 1 g via INTRAVENOUS
  Filled 2013-11-22 (×4): qty 10

## 2013-11-22 MED ORDER — SODIUM CHLORIDE 0.9 % IV BOLUS (SEPSIS)
500.0000 mL | Freq: Once | INTRAVENOUS | Status: AC
  Administered 2013-11-22: 500 mL via INTRAVENOUS

## 2013-11-22 MED ORDER — DEXTROSE 5 % IV SOLN
INTRAVENOUS | Status: AC
  Filled 2013-11-22: qty 10

## 2013-11-22 NOTE — Consult Note (Addendum)
Name: Valerie Young MRN: [id number] DOB: 1929-07-11    ADMISSION DATE:  11/22/2013 CONSULTATION DATE:  11/22/2013  REFERRING MD :  Marcheta Grammes  CHIEF COMPLAINT:  Sob, hypoxia   HISTORY OF PRESENT ILLNESS:  79 y.o.ex smoker presented to [facility] ED 11/22/2013 with worsening shortness of breath and cough for few days . Pt also reported subjective fevers, fatigue, myalgia. severely hypoxic on the admission, 76% on 2 L nasal canula. Rapid flu test neg, she was treated as CAP & COPD excaerbation. We are consulted for hypoxia.  Last seen dec 12 by dr clance for recurrentcough. This was felt secondary to an ACE inhibitor, and also possibly do to radiation pneumonitis. She was changed to an ARB, and also treated with a course of prednisone.  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  Complicating health problems have included chronic kidney disease stage III, thyroid surgery for goiter. No hx TB exposure.  Limited mobility due to rt knee osteoarthritis   PAST MEDICAL HISTORY :  Past Medical History  Diagnosis Date  . Hypertension   . Hypothyroidism   . COPD (chronic obstructive pulmonary disease)   . DJD (degenerative joint disease)     knees  . PND (post-nasal drip)     Chronic  . H/O: lung cancer 1982    Right; treated with resection  . Cancer     right lung ca - 30 years ago / left lung - 11/2012   Past Surgical History  Procedure Laterality Date  . Total knee arthroplasty      Right  . Thyroidectomy    . Vesicovaginal fistula closure w/ tah    . Right lung cancer treated with resection  1982  . Right thigh postoperative hematoma  02-2008  . Video bronchoscopy  12/08/2012    Procedure: VIDEO BRONCHOSCOPY WITH FLUORO;  Surgeon: Kathee Delton, MD;  Location: [facility];  Service:  Cardiopulmonary;  Laterality: Bilateral;   Prior to Admission medications   Medication Sig Start Date End Date Taking? Authorizing Provider  albuterol (PROVENTIL HFA;VENTOLIN HFA) 108 (90 BASE) MCG/ACT inhaler Inhale 2 puffs into the lungs every 6 (six) hours as needed for wheezing or shortness of breath.   Yes Historical Provider, MD  atenolol (TENORMIN) 50 MG tablet Take 1 tablet by mouth daily. 09/30/12  Yes Historical Provider, MD  cetirizine (ZYRTEC ALLERGY) 10 MG tablet Take 1 tablet (10 mg total) by mouth at bedtime. 09/07/13  Yes Kathee Delton, MD  cholecalciferol (VITAMIN D) 1000 UNITS tablet Take 1,000 Units by mouth daily.   Yes Historical Provider, MD  levothyroxine (SYNTHROID, LEVOTHROID) 50 MCG tablet Take 1 tablet by mouth daily. 09/30/12  Yes Historical Provider, MD  olmesartan (BENICAR) 20 MG tablet Take 0.5 tablets (10 mg total) by mouth daily. 11/15/13  Yes Angelica Chessman, MD  acetaminophen-codeine (TYLENOL #3) 300-30 MG per tablet Take 1 tablet by mouth every 4 (four) hours as needed for moderate pain. 11/15/13   Angelica Chessman, MD   Allergies  Allergen Reactions  . Codeine   . Sulfamethoxazole-Trimethoprim     REACTION: rash    FAMILY HISTORY:  Family History  Problem Relation Age of Onset  . CVA Mother 47    hemorrhagic    SOCIAL HISTORY:  reports that she quit smoking about 86  years ago. Her smoking use included Cigarettes. She has a 35 pack-year smoking history. She does not have any smokeless tobacco history on file. She reports that she does not drink alcohol or use illicit drugs.  REVIEW OF SYSTEMS:   Constitutional: Negative for fever, chills, weight loss, malaise/fatigue and diaphoresis.  HENT: Negative for hearing loss, ear pain, nosebleeds, congestion, sore throat, neck pain, tinnitus and ear discharge.   Eyes: Negative for blurred vision, double vision, photophobia, pain, discharge and redness.  Respiratory: Negative for hemoptysis, sputum  production,  wheezing and stridor.   Cardiovascular: Negative for chest pain, palpitations, orthopnea, claudication, leg swelling and PND.  Gastrointestinal: Negative for heartburn, nausea, vomiting, abdominal pain, diarrhea, constipation, blood in stool and melena.  Genitourinary: Negative for dysuria, urgency, frequency, hematuria and flank pain.  Musculoskeletal: Negative for myalgias, back pain, joint pain and falls.  Skin: Negative for itching and rash.  Neurological: Negative for dizziness, tingling, tremors, sensory change, speech change, focal weakness, seizures, loss of consciousness, weakness and headaches.  Endo/Heme/Allergies: Negative for environmental allergies and polydipsia. Does not bruise/bleed easily.  SUBJECTIVE:   VITAL SIGNS: Temp:  [97.4 F (36.3 C)-98 F (36.7 C)] 97.4 F (36.3 C) (12/29 1409) Pulse Rate:  [80-111] 80 (12/29 1409) Resp:  [20-26] 20 (12/29 1409) BP: (95-117)/(55-66) 115/66 mmHg (12/29 1409) SpO2:  [76 %-98 %] 98 % (12/29 1409) FiO2 (%):  [89 %] 89 % (12/29 0052) Weight:  [55 kg (121 lb 4.1 oz)] 55 kg (121 lb 4.1 oz) (12/29 0343)  PHYSICAL EXAMINATION: Gen. Frail, chronically ill,, well-nourished, in mild distress, normal affect ENT - no lesions, no post nasal drip Neck: No JVD, no thyromegaly, no carotid bruits Lungs: mild use of accessory muscles with exertion, no dullness to percussion, clear without rales or rhonchi , bronchial breathing LUL Cardiovascular: Rhythm regular, heart sounds  normal, no murmurs, no peripheral edema Abdomen: soft and non-tender, no hepatosplenomegaly, BS normal. Musculoskeletal: No deformities, no cyanosis or clubbing Neuro:  alert, non focal Skin:  Warm, no lesions/ rash    Recent Labs Lab 11/15/13 1832 11/22/13 0124  NA 138 136  K 4.2 5.1  CL 100 100  CO2 27 21  BUN 28* 21  CREATININE 1.55* 1.56*  GLUCOSE 104* 233*    Recent Labs Lab 11/15/13 1832 11/22/13 0124  HGB 12.4 11.5*  HCT 36.1 34.4*   WBC 8.1 13.8*  PLT 246 219   Dg Chest 2 View  11/22/2013   CLINICAL DATA:  Shortness of breath. Prior adenocarcinoma of the left upper lobe for which the patient underwent radiation therapy.  EXAM: CHEST  2 VIEW  COMPARISON:  Two-view chest x-ray 11/05/2013. CT chest 08/11/2013, 04/22/2013. PET-CT 12/21/2012.  FINDINGS: Post radiation fibrosis and bronchiectasis involving the left upper lobe, unchanged. Surgical clips and surgical suture material medially and laterally in the right upper lobe, arm presumably from prior bullectomy. Emphysematous changes throughout both lungs with hyperinflation. No new pulmonary parenchymal abnormalities. No pleural effusions.  Cardiac silhouette upper normal in size, unchanged. Thoracic aorta atherosclerotic, unchanged. Hilar and mediastinal contours otherwise unremarkable. Degenerative changes involving the thoracic spine.  IMPRESSION: No acute cardiopulmonary disease. Post radiation fibrosis and bronchiectasis involving the left upper lobe, unchanged. COPD/emphysema.   Electronically Signed   By: Evangeline Dakin M.D.   On: 11/22/2013 02:00    ASSESSMENT / PLAN:  New onset hypoxia - I am not convinced that this can be attributed to 'copd flare' -I did not auscultate significant bronchospasm. As such, pulm  embolism will have to be ruled out. Due to cr 1.5 , cannot proceed with spiral CT. Will stop ARB & start heparin IV WIll obtain venous duplex. Although VQ hard to interpret with an abnormal CXR, will proceed with this. If intermed prob, may have to rely on serial duplex  COPD, LUL radiation pneumonitis / fibrosis- FEV1 1.51 -80%  Ok to use prednisone , this has relieved her cough in the past Agree with CAP coverage  Discussed with pt & daughter  Rigoberto Noel  Pulmonary and [place] Pager: [phone]  11/22/2013, 5:17 PM

## 2013-11-22 NOTE — Progress Notes (Signed)
On-call Baltazar Najjar notified of VQ results. No new orders.

## 2013-11-22 NOTE — H&P (Signed)
[facility] History and Physical  Valerie Young [id number] DOB: October 24, 1929 DOA: 11/22/2013  Referring physician: EDP PCP: Angelica Chessman, MD   Chief Complaint: SOB   HPI: Valerie Young is a 79 y.o. female with h/o lung CA x2 currently in remission.  Presents to the ED with SOB.  Symptoms onset 4 days ago, worsening, associated with dry cough and runny nose.  No myalgias, not sure that shes had any fevers.  Not normally on O2 at home but requiring O2 here.  Symptoms improved after albuterol EMS treatment but still short of breath.  Review of Systems: Systems reviewed.  As above, otherwise negative  Past Medical History  Diagnosis Date  . Hypertension   . Hypothyroidism   . COPD (chronic obstructive pulmonary disease)   . DJD (degenerative joint disease)     knees  . PND (post-nasal drip)     Chronic  . H/O: lung cancer 1982    Right; treated with resection  . Cancer     right lung ca - 30 years ago / left lung - 11/2012   Past Surgical History  Procedure Laterality Date  . Total knee arthroplasty      Right  . Thyroidectomy    . Vesicovaginal fistula closure w/ tah    . Right lung cancer treated with resection  1982  . Right thigh postoperative hematoma  02-2008  . Video bronchoscopy  12/08/2012    Procedure: VIDEO BRONCHOSCOPY WITH FLUORO;  Surgeon: Kathee Delton, MD;  Location: [facility];  Service: Cardiopulmonary;  Laterality: Bilateral;   Social History:  reports that she quit smoking about 33 years ago. Her smoking use included Cigarettes. She has a 35 pack-year smoking history. She does not have any smokeless tobacco history on file. She reports that she does not drink alcohol or use illicit drugs.  Allergies  Allergen Reactions  . Codeine   . Sulfamethoxazole-Trimethoprim     REACTION: rash    Family History  Problem Relation Age of Onset  . CVA Mother 55    hemorrhagic      Prior to Admission medications   Medication Sig Start Date End  Date Taking? Authorizing Provider  albuterol (PROVENTIL HFA;VENTOLIN HFA) 108 (90 BASE) MCG/ACT inhaler Inhale 2 puffs into the lungs every 6 (six) hours as needed for wheezing or shortness of breath.   Yes Historical Provider, MD  atenolol (TENORMIN) 50 MG tablet Take 1 tablet by mouth daily. 09/30/12  Yes Historical Provider, MD  cetirizine (ZYRTEC ALLERGY) 10 MG tablet Take 1 tablet (10 mg total) by mouth at bedtime. 09/07/13  Yes Kathee Delton, MD  cholecalciferol (VITAMIN D) 1000 UNITS tablet Take 1,000 Units by mouth daily.   Yes Historical Provider, MD  levothyroxine (SYNTHROID, LEVOTHROID) 50 MCG tablet Take 1 tablet by mouth daily. 09/30/12  Yes Historical Provider, MD  olmesartan (BENICAR) 20 MG tablet Take 0.5 tablets (10 mg total) by mouth daily. 11/15/13  Yes Angelica Chessman, MD  acetaminophen-codeine (TYLENOL #3) 300-30 MG per tablet Take 1 tablet by mouth every 4 (four) hours as needed for moderate pain. 11/15/13   Angelica Chessman, MD   Physical Exam: Filed Vitals:   11/22/13 0057  BP: 107/55  Pulse: 111  Temp: 98 F (36.7 C)  Resp: 23    BP 107/55  Pulse 111  Temp(Src) 98 F (36.7 C) (Oral)  Resp 23  SpO2 93%  General Appearance:    Alert, oriented, no distress, appears stated age  Head:  Normocephalic, atraumatic  Eyes:    PERRL, EOMI, sclera non-icteric        Nose:   Nares without drainage or epistaxis. Mucosa, turbinates normal  Throat:   Moist mucous membranes. Oropharynx without erythema or exudate.  Neck:   Supple. No carotid bruits.  No thyromegaly.  No lymphadenopathy.   Back:     No CVA tenderness, no spinal tenderness  Lungs:     Clear to auscultation bilaterally, without wheezes, rhonchi or rales  Chest wall:    No tenderness to palpitation  Heart:    Regular rate and rhythm without murmurs, gallops, rubs  Abdomen:     Soft, non-tender, nondistended, normal bowel sounds, no organomegaly  Genitalia:    deferred  Rectal:    deferred  Extremities:    No clubbing, cyanosis or edema.  Pulses:   2+ and symmetric all extremities  Skin:   Skin color, texture, turgor normal, no rashes or lesions  Lymph nodes:   Cervical, supraclavicular, and axillary nodes normal  Neurologic:   CNII-XII intact. Normal strength, sensation and reflexes      throughout    Labs on Admission:  Basic Metabolic Panel:  Recent Labs Lab 11/15/13 1832 11/22/13 0124  NA 138 136  K 4.2 5.1  CL 100 100  CO2 27 21  GLUCOSE 104* 233*  BUN 28* 21  CREATININE 1.55* 1.56*  CALCIUM 9.5 9.7   Liver Function Tests:  Recent Labs Lab 11/15/13 1832  AST 17  ALT 15  ALKPHOS 63  BILITOT 0.6  PROT 6.3  ALBUMIN 3.7   No results found for this basename: LIPASE, AMYLASE,  in the last 168 hours No results found for this basename: AMMONIA,  in the last 168 hours CBC:  Recent Labs Lab 11/15/13 1832 11/22/13 0124  WBC 8.1 13.8*  NEUTROABS 6.2 11.9*  HGB 12.4 11.5*  HCT 36.1 34.4*  MCV 93.5 96.9  PLT 246 219   Cardiac Enzymes: No results found for this basename: CKTOTAL, CKMB, CKMBINDEX, TROPONINI,  in the last 168 hours  BNP (last 3 results) No results found for this basename: PROBNP,  in the last 8760 hours CBG: No results found for this basename: GLUCAP,  in the last 168 hours  Radiological Exams on Admission: Dg Chest 2 View  11/22/2013   CLINICAL DATA:  Shortness of breath. Prior adenocarcinoma of the left upper lobe for which the patient underwent radiation therapy.  EXAM: CHEST  2 VIEW  COMPARISON:  Two-view chest x-ray 11/05/2013. CT chest 08/11/2013, 04/22/2013. PET-CT 12/21/2012.  FINDINGS: Post radiation fibrosis and bronchiectasis involving the left upper lobe, unchanged. Surgical clips and surgical suture material medially and laterally in the right upper lobe, arm presumably from prior bullectomy. Emphysematous changes throughout both lungs with hyperinflation. No new pulmonary parenchymal abnormalities. No pleural effusions.  Cardiac  silhouette upper normal in size, unchanged. Thoracic aorta atherosclerotic, unchanged. Hilar and mediastinal contours otherwise unremarkable. Degenerative changes involving the thoracic spine.  IMPRESSION: No acute cardiopulmonary disease. Post radiation fibrosis and bronchiectasis involving the left upper lobe, unchanged. COPD/emphysema.   Electronically Signed   By: Evangeline Dakin M.D.   On: 11/22/2013 02:00    EKG: Independently reviewed.  Assessment/Plan Principal Problem:   COPD exacerbation   1. COPD exacerbation - wheezing resolved at this time, admitting, adult wheeze protocol, tamiflu ordered by EDP will continue this for now, continuing azithromycin but will hold off on rocephin unless signs or symptoms of PNA show up. 2. CKD -  kidney function at baseline 3. Hyperglycemia - will put on SSI low dose, AC/HS does not appear to have a history of DM    Code Status: Full  Family Communication: Daughter at bedside Disposition Plan: Admit to inpatient   Time spent: 70 min  GARDNER, JARED M. [facility] Pager [phone]  If 7AM-7PM, please contact the day team taking care of the patient [email or web address] Password [facility] 11/22/2013, 2:55 AM

## 2013-11-22 NOTE — Progress Notes (Signed)
INITIAL NUTRITION ASSESSMENT  DOCUMENTATION CODES Per approved criteria  -Not Applicable   INTERVENTION: Provide Ensure Complete BID Encourage PO intake  NUTRITION DIAGNOSIS: Inadequate oral intake related to poor appetite as evidenced by 2% weight loss in less than 2 weeks.   Goal: Pt to meet >/= 90% of their estimated nutrition needs   Monitor:  PO intake Weight Labs  Reason for Assessment: Malnutrition Screening Tool, score of 2  79 y.o. female  Admitting Dx: COPD exacerbation  ASSESSMENT: 79 y.o. female with h/o lung CA x2 currently in remission. Presents to the ED with SOB. Symptoms onset 4 days ago, worsening, associated with dry cough and runny nose.  Pt not feeling well at time of visit, with SOB. Pt reports having a poor appetite for the past 1-2 weeks. She reports weighing 123 lbs usually; she usually has a good appetite and eats 3 meals daily. Per nursing notes, pt ate 75% of breakfast. Pt reports drinking Boost supplements in the past.   Height: Ht Readings from Last 1 Encounters:  11/22/13 5\' 4"  (1.626 m)    Weight: Wt Readings from Last 1 Encounters:  11/22/13 121 lb 4.1 oz (55 kg)    Ideal Body Weight: 120 lbs  % Ideal Body Weight: 100%  Wt Readings from Last 10 Encounters:  11/22/13 121 lb 4.1 oz (55 kg)  11/15/13 124 lb (56.246 kg)  11/05/13 123 lb (55.792 kg)  09/28/13 120 lb 6.4 oz (54.613 kg)  09/07/13 118 lb 9.6 oz (53.797 kg)  08/19/13 119 lb (53.978 kg)  05/07/13 120 lb 3.2 oz (54.522 kg)  04/08/13 121 lb 14.4 oz (55.293 kg)  03/04/13 121 lb (54.885 kg)  02/18/13 124 lb 3.2 oz (56.337 kg)    Usual Body Weight: 123 lbs  % Usual Body Weight: 98%  BMI:  Body mass index is 20.8 kg/(m^2).  Estimated Nutritional Needs: Kcal: 1300-1500 Protein: 75-85 grams Fluid: 1.5 L/day  Skin: intact  Diet Order: Carb Control  EDUCATION NEEDS: -No education needs identified at this time   Intake/Output Summary (Last 24 hours) at 11/22/13  1117 Last data filed at 11/22/13 0845  Gross per 24 hour  Intake    480 ml  Output      0 ml  Net    480 ml    Last BM: 12/28   Labs:   Recent Labs Lab 11/15/13 1832 11/22/13 0124  NA 138 136  K 4.2 5.1  CL 100 100  CO2 27 21  BUN 28* 21  CREATININE 1.55* 1.56*  CALCIUM 9.5 9.7  GLUCOSE 104* 233*    CBG (last 3)   Recent Labs  11/22/13 0741  GLUCAP 307*    Scheduled Meds: . albuterol  2.5 mg Nebulization QID  . atenolol  50 mg Oral Daily  . azithromycin  500 mg Oral QHS  . cholecalciferol  1,000 Units Oral Daily  . cefTRIAXone (ROCEPHIN) IVPB 1 gram/50 mL D5W      . heparin  5,000 Units Subcutaneous Q8H  . insulin aspart  0-9 Units Subcutaneous TID WC  . ipratropium  0.5 mg Nebulization QID  . irbesartan  150 mg Oral Daily  . levothyroxine  50 mcg Oral QAC breakfast  . loratadine  10 mg Oral Daily  . oseltamivir  75 mg Oral BID  . predniSONE      . [START ON 11/23/2013] predniSONE  40 mg Oral Q breakfast    Continuous Infusions:   Past Medical History  Diagnosis Date  .  Hypertension   . Hypothyroidism   . COPD (chronic obstructive pulmonary disease)   . DJD (degenerative joint disease)     knees  . PND (post-nasal drip)     Chronic  . H/O: lung cancer 1982    Right; treated with resection  . Cancer     right lung ca - 30 years ago / left lung - 11/2012    Past Surgical History  Procedure Laterality Date  . Total knee arthroplasty      Right  . Thyroidectomy    . Vesicovaginal fistula closure w/ tah    . Right lung cancer treated with resection  1982  . Right thigh postoperative hematoma  02-2008  . Video bronchoscopy  12/08/2012    Procedure: VIDEO BRONCHOSCOPY WITH FLUORO;  Surgeon: Kathee Delton, MD;  Location: [facility];  Service: Cardiopulmonary;  Laterality: Bilateral;    Pryor Ochoa RD, LDN Inpatient Clinical Dietitian Pager: [phone] After Hours Pager: [phone]

## 2013-11-22 NOTE — Progress Notes (Signed)
[facility] PROGRESS NOTE  Valerie Young [id number] DOB: [birth year] DOA: 11/22/2013 PCP: Angelica Chessman, MD Pulmonology:  Dr. Sabino Donovan  Assessment/Plan:  Acute respiratory distress  Hx of lung cancer s/p resection and then recurrence in the other lung.  Sees Dr. Tammi Klippel Patient is not wheezing but is significantly sob requiring 5L of oxygen - obstructive?  Influenza PCR is negative. Treating with prednisone (40 mg), Nebs, Azith, Oxygen Check 2D echo. On beta blocker. Consulted Pulmonology.  Appreciate their recommendations.  CKD Creatinine appears at baseline  Hyperglycemia Steroid induced.    DVT Prophylaxis:  heparin  Code Status: full Family Communication: patient alert and orientated. Disposition Plan: to home when able.   Lives alone.   Consultants:  pulm  Procedures:    Antibiotics:  Azith.  HPI/Subjective: Patient reports she is breathing better than on admission.  "when I called for the ambulance I was half dead"  Objective: Filed Vitals:   11/22/13 0700 11/22/13 0858 11/22/13 0938 11/22/13 1409  BP:    115/66  Pulse:    80  Temp:    97.4 F (36.3 C)  TempSrc:    Oral  Resp:    20  Height:      Weight:      SpO2: 96% 94% 92% 98%    Intake/Output Summary (Last 24 hours) at 11/22/13 1530 Last data filed at 11/22/13 0845  Gross per 24 hour  Intake    480 ml  Output      0 ml  Net    480 ml   Filed Weights   11/22/13 0343  Weight: 55 kg (121 lb 4.1 oz)    Exam: General: Well developed, well nourished, NAD, appears stated age  10:  PERR, EOMI, Anicteic Sclera, MMM. No pharyngeal erythema or exudates  Neck: Supple, no JVD, no masses  Cardiovascular: RRR, S1 S2 auscultated, no rubs, murmurs or gallops.   Respiratory: Clear to auscultation bilaterally with equal chest rise, Patient with increased WOB, unable to finish sentenances without gasping even though she is on 5 liters of oxygen Abdomen: Soft, nontender,  nondistended, + bowel sounds  Extremities: warm dry without cyanosis clubbing or edema.  Neuro: AAOx3, cranial nerves grossly intact. Strength 5/5 in upper and lower extremities  Skin: Without rashes exudates or nodules.   Psych: Normal affect and demeanor with intact judgement and insight       Data Reviewed: Basic Metabolic Panel:  Recent Labs Lab 11/15/13 1832 11/22/13 0124  NA 138 136  K 4.2 5.1  CL 100 100  CO2 27 21  GLUCOSE 104* 233*  BUN 28* 21  CREATININE 1.55* 1.56*  CALCIUM 9.5 9.7   Liver Function Tests:  Recent Labs Lab 11/15/13 1832  AST 17  ALT 15  ALKPHOS 63  BILITOT 0.6  PROT 6.3  ALBUMIN 3.7   CBC:  Recent Labs Lab 11/15/13 1832 11/22/13 0124  WBC 8.1 13.8*  NEUTROABS 6.2 11.9*  HGB 12.4 11.5*  HCT 36.1 34.4*  MCV 93.5 96.9  PLT 246 219   CBG:  Recent Labs Lab 11/22/13 0741  GLUCAP 307*      Studies: Dg Chest 2 View  11/22/2013   CLINICAL DATA:  Shortness of breath. Prior adenocarcinoma of the left upper lobe for which the patient underwent radiation therapy.  EXAM: CHEST  2 VIEW  COMPARISON:  Two-view chest x-ray 11/05/2013. CT chest 08/11/2013, 04/22/2013. PET-CT 12/21/2012.  FINDINGS: Post radiation fibrosis and bronchiectasis involving the left upper lobe, unchanged.  Surgical clips and surgical suture material medially and laterally in the right upper lobe, arm presumably from prior bullectomy. Emphysematous changes throughout both lungs with hyperinflation. No new pulmonary parenchymal abnormalities. No pleural effusions.  Cardiac silhouette upper normal in size, unchanged. Thoracic aorta atherosclerotic, unchanged. Hilar and mediastinal contours otherwise unremarkable. Degenerative changes involving the thoracic spine.  IMPRESSION: No acute cardiopulmonary disease. Post radiation fibrosis and bronchiectasis involving the left upper lobe, unchanged. COPD/emphysema.   Electronically Signed   By: Evangeline Dakin M.D.   On:  11/22/2013 02:00    Scheduled Meds: . albuterol  2.5 mg Nebulization QID  . atenolol  50 mg Oral Daily  . azithromycin  500 mg Oral QHS  . cholecalciferol  1,000 Units Oral Daily  . feeding supplement (ENSURE COMPLETE)  237 mL Oral BID BM  . heparin  5,000 Units Subcutaneous Q8H  . insulin aspart  0-9 Units Subcutaneous TID WC  . ipratropium  0.5 mg Nebulization QID  . irbesartan  150 mg Oral Daily  . levothyroxine  50 mcg Oral QAC breakfast  . loratadine  10 mg Oral Daily  . oseltamivir  75 mg Oral BID  . [START ON 11/23/2013] predniSONE  40 mg Oral Q breakfast   Continuous Infusions:   Principal Problem:   COPD exacerbation Active Problems:   Adenocarcinoma of lung   Cough   Chronic kidney disease (CKD), stage IV (severe)    Karen Kitchens  [facility] Pager [phone]. If 7PM-7AM, please contact night-coverage at [email or web address], password [email or web address] 11/22/2013, 3:30 PM  LOS: 0 days

## 2013-11-22 NOTE — ED Notes (Signed)
Pt has hx of asthma and COPD, called EMS for shortness of breath, EMS gave breathing treatment in route with improvement

## 2013-11-22 NOTE — ED Provider Notes (Signed)
CSN: [id number]     Arrival date & time 11/22/13  0036 History   First MD Initiated Contact with Patient 11/22/13 0046     Chief Complaint  Patient presents with  . Shortness of Breath   (Consider location/radiation/quality/duration/timing/severity/associated sxs/prior Treatment) HPI Comments: 79 year old female presents with shortness of breath. She states his been for the past 4 days. She's been having a dry cough and runny nose. Denies any myalgias. She states that she's not any fevers that she knows of. Today got acutely worse. She has a history of COPD and lung cancer but does not normally have oxygen. She's given nebulizer treatment by EMS and states she feels like she has improved some. She does not normally take anything for COPD at home. Denies any chest pain or chest tightness.   Past Medical History  Diagnosis Date  . Hypertension   . Hypothyroidism   . COPD (chronic obstructive pulmonary disease)   . DJD (degenerative joint disease)     knees  . PND (post-nasal drip)     Chronic  . H/O: lung cancer 1982    Right; treated with resection  . Cancer     right lung ca - 30 years ago / left lung - 11/2012   Past Surgical History  Procedure Laterality Date  . Total knee arthroplasty      Right  . Thyroidectomy    . Vesicovaginal fistula closure w/ tah    . Right lung cancer treated with resection  1982  . Right thigh postoperative hematoma  02-2008  . Video bronchoscopy  12/08/2012    Procedure: VIDEO BRONCHOSCOPY WITH FLUORO;  Surgeon: Kathee Delton, MD;  Location: [facility];  Service: Cardiopulmonary;  Laterality: Bilateral;   Family History  Problem Relation Age of Onset  . CVA Mother 11    hemorrhagic    History  Substance Use Topics  . Smoking status: Former Smoker -- 1.00 packs/day for 35 years    Types: Cigarettes    Quit date: 11/25/1980  . Smokeless tobacco: Not on file  . Alcohol Use: No   OB History   Grav Para Term Preterm Abortions TAB SAB Ect  Mult Living                 Review of Systems  Constitutional: Negative for fever and chills.  HENT: Positive for rhinorrhea.   Respiratory: Positive for cough and shortness of breath.   Cardiovascular: Negative for chest pain.  Gastrointestinal: Negative for vomiting and abdominal pain.  Musculoskeletal: Negative for myalgias.  All other systems reviewed and are negative.    Allergies  Codeine and Sulfamethoxazole-trimethoprim  Home Medications   Current Outpatient Rx  Name  Route  Sig  Dispense  Refill  . albuterol (PROVENTIL HFA;VENTOLIN HFA) 108 (90 BASE) MCG/ACT inhaler   Inhalation   Inhale 2 puffs into the lungs every 6 (six) hours as needed for wheezing or shortness of breath.         [name] atenolol (TENORMIN) 50 MG tablet   Oral   Take 1 tablet by mouth daily.         . cetirizine (ZYRTEC ALLERGY) 10 MG tablet   Oral   Take 1 tablet (10 mg total) by mouth at bedtime.   30 tablet   6   . cholecalciferol (VITAMIN D) 1000 UNITS tablet   Oral   Take 1,000 Units by mouth daily.         [name] levothyroxine (SYNTHROID, LEVOTHROID) 50 MCG  tablet   Oral   Take 1 tablet by mouth daily.         [name] olmesartan (BENICAR) 20 MG tablet   Oral   Take 0.5 tablets (10 mg total) by mouth daily.   60 tablet   2   . acetaminophen-codeine (TYLENOL #3) 300-30 MG per tablet   Oral   Take 1 tablet by mouth every 4 (four) hours as needed for moderate pain.   30 tablet   0    BP 107/55  Pulse 111  Temp(Src) 98 F (36.7 C) (Oral)  Resp 23  SpO2 93% Physical Exam  Nursing note and vitals reviewed. Constitutional: She is oriented to person, place, and time. She appears well-developed and well-nourished.  HENT:  Head: Normocephalic and atraumatic.  Right Ear: External ear normal.  Left Ear: External ear normal.  Nose: Nose normal.  Eyes: Right eye exhibits no discharge. Left eye exhibits no discharge.  Cardiovascular: Regular rhythm and normal heart sounds.   Tachycardia present.   Pulmonary/Chest: Breath sounds normal. Tachypnea noted. She has no decreased breath sounds.  Abdominal: Soft. There is no tenderness.  Neurological: She is alert and oriented to person, place, and time.  Skin: Skin is warm and dry.    ED Course  Procedures (including critical care time) Labs Review Labs Reviewed  CBC WITH DIFFERENTIAL - Abnormal; Notable for the following:    WBC 13.8 (*)    RBC 3.55 (*)    Hemoglobin 11.5 (*)    HCT 34.4 (*)    Neutrophils Relative % 86 (*)    Neutro Abs 11.9 (*)    Lymphocytes Relative 6 (*)    All other components within normal limits  BASIC METABOLIC PANEL - Abnormal; Notable for the following:    Glucose, Bld 233 (*)    Creatinine, Ser 1.56 (*)    GFR calc non Af Amer 30 (*)    GFR calc Af Amer 35 (*)    All other components within normal limits  BLOOD GAS, ARTERIAL - Abnormal; Notable for the following:    pCO2 arterial 29.5 (*)    pO2, Arterial 76.2 (*)    Acid-base deficit 2.7 (*)    All other components within normal limits  CG4 I-STAT (LACTIC ACID) - Abnormal; Notable for the following:    Lactic Acid, Venous 2.48 (*)    All other components within normal limits  INFLUENZA PANEL BY PCR  POCT I-STAT TROPONIN I   Imaging Review Dg Chest 2 View  11/22/2013   CLINICAL DATA:  Shortness of breath. Prior adenocarcinoma of the left upper lobe for which the patient underwent radiation therapy.  EXAM: CHEST  2 VIEW  COMPARISON:  Two-view chest x-ray 11/05/2013. CT chest 08/11/2013, 04/22/2013. PET-CT 12/21/2012.  FINDINGS: Post radiation fibrosis and bronchiectasis involving the left upper lobe, unchanged. Surgical clips and surgical suture material medially and laterally in the right upper lobe, arm presumably from prior bullectomy. Emphysematous changes throughout both lungs with hyperinflation. No new pulmonary parenchymal abnormalities. No pleural effusions.  Cardiac silhouette upper normal in size, unchanged.  Thoracic aorta atherosclerotic, unchanged. Hilar and mediastinal contours otherwise unremarkable. Degenerative changes involving the thoracic spine.  IMPRESSION: No acute cardiopulmonary disease. Post radiation fibrosis and bronchiectasis involving the left upper lobe, unchanged. COPD/emphysema.   Electronically Signed   By: Evangeline Dakin M.D.   On: 11/22/2013 02:00    EKG Interpretation    Date/Time:    Ventricular Rate:    PR Interval:  QRS Duration:   QT Interval:    QTC Calculation:   R Axis:     Text Interpretation:              MDM   1. Hypoxia    Patient hypoxic, requiring 4 L of oxygen. His symptoms improved after albuterol and Atrovent nebulizer. Given steroids as well she has a history of COPD. Concern for atypical pneumonia versus influenza. Will treat with both Tamiflu and Rocephin/Zithromax. Admit to the hospitalist.    Ephraim Hamburger, MD 11/22/13 [phone]

## 2013-11-22 NOTE — Care Management Note (Signed)
   CARE MANAGEMENT NOTE 11/22/2013  Patient:  Valerie Young, Valerie Young   Account Number:  [id number]  Date Initiated:  11/22/2013  Documentation initiated by:  Samanvitha Germany  Subjective/Objective Assessment:   79 yo female admitted with COPD.PCP: Angelica Chessman, MD.     Action/Plan:   Home when stable   Anticipated DC Date:     Anticipated DC Plan:  [place] referral  Clinical Social Worker      DC Planning Services  CM consult      Choice offered to / List presented to:  NA   DME arranged  NA      DME agency  NA     [place] arranged  NA      [place] agency  NA   Status of service:  In process, will continue to follow Medicare Important Message given?   (If response is "NO", the following Medicare IM given date fields will be blank) Date Medicare IM given:   Date Additional Medicare IM given:    Discharge Disposition:    Per UR Regulation:  Reviewed for med. necessity/level of care/duration of stay  If discussed at [place] of Stay Meetings, dates discussed:    Comments:  11/22/13 [place] Chart reviewed for utilization of services. Pt currently on 4L n/c, no previous HX of home oxygen. Pt may require home oxygen at d/c if inability to compensate on RA. Please assess pulmonary saturation and document 24 hrs prior to dc. Pt eval pending to further assess dc needs.

## 2013-11-22 NOTE — Progress Notes (Signed)
[facility] PROGRESS NOTE  PERLITA KOHLENBERG [id number] DOB: 09-Dec-1928 DOA: 11/22/2013 PCP: Angelica Chessman, MD  Brief narrative: 79 y.o. female with past medical history of lung adenocarcinoma status post resection and recurrence, CKD, COPD, hypothyroidism who presented to [facility] ED 11/22/2013 with worsening shortness of breath and cough for past few days prior to this admission. Pt also reported subjective fevers, fatigue, myalgia. Pt was found to have negative flu test but is being treated for possible community acquired pneumonia and COPD exacerbation.   Assessment/Plan:  Principal Problem:   Acute respiratory failure with hypoxia - severely hypoxic on the admission, 76% on 2 L nasal canula - now respiratory status stable - likely due to COPD exacerbation and CAP - continue treatment with nebulizer treatments, antibiotics and steroids (prednisone PO)  - oxygen support via nasal canula to keep O2 saturation above 90% Active Problems:   CAP (community acquired pneumonia) - continue azithromycin and rocephin - blood cultures not obtained at the time of the admission - order placed for strep pneumo, legionella and resp culture - influenza negative    COPD exacerbation - treatment as above, nebulizers, antibiotics, prednisone   HYPERTENSION - continue atenolol, avapro   Chronic kidney disease (CKD), stage IV (severe) - creatinine 1.24 even in 2009 - continue to monitor, on the admission creatinine was 1.56   Leukocytosis, unspecified - due to CAP - continue antibiotics as above    Anemia of chronic disease - secondary to history of lung ca - hemoglobin 11.5 on the admission, no indications for transfusion    HYPOTHYROIDISM - continue synthroid   Code Status: full code  Family Communication: no family at the bedside Disposition Plan: needs PT evaluation  [name], MD  [facility] Pager [phone]  If 7PM-7AM, please contact  night-coverage [email or web address] Password [email or web address] 11/22/2013, 3:37 PM   LOS: 0 days   Consultants:  None   Procedures:  None   Antibiotics:  Azithromycin 11/22/2013 -->  Rocephin 11/22/2013 -->  Oseltamivir 11/22/2013 --> 11/22/2013  HPI/Subjective: Some distress, short of breath when she exerts herself.   Objective: Filed Vitals:   11/22/13 0700 11/22/13 0858 11/22/13 0938 11/22/13 1409  BP:    115/66  Pulse:    80  Temp:    97.4 F (36.3 C)  TempSrc:    Oral  Resp:    20  Height:      Weight:      SpO2: 96% 94% 92% 98%    Intake/Output Summary (Last 24 hours) at 11/22/13 1537 Last data filed at 11/22/13 0845  Gross per 24 hour  Intake    480 ml  Output      0 ml  Net    480 ml    Exam:   General:  Pt is alert, follows commands appropriately, not in acute distress  Cardiovascular: Regular rate and rhythm, S1/S2, no murmurs, no rubs, no gallops  Respiratory: Congested, short of breath, rhonchi in bases and mid lung lobes  Abdomen: Soft, non tender, non distended, bowel sounds present, no guarding  Extremities: No edema, pulses DP and PT palpable bilaterally  Neuro: Grossly nonfocal  Data Reviewed: Basic Metabolic Panel:  Recent Labs Lab 11/15/13 1832 11/22/13 0124  NA 138 136  K 4.2 5.1  CL 100 100  CO2 27 21  GLUCOSE 104* 233*  BUN 28* 21  CREATININE 1.55* 1.56*  CALCIUM 9.5 9.7   Liver Function Tests:  Recent Labs Lab 11/15/13 1832  AST 17  ALT 15  ALKPHOS 63  BILITOT 0.6  PROT 6.3  ALBUMIN 3.7   No results found for this basename: LIPASE, AMYLASE,  in the last 168 hours No results found for this basename: AMMONIA,  in the last 168 hours CBC:  Recent Labs Lab 11/15/13 1832 11/22/13 0124  WBC 8.1 13.8*  NEUTROABS 6.2 11.9*  HGB 12.4 11.5*  HCT 36.1 34.4*  MCV 93.5 96.9  PLT 246 219   Cardiac Enzymes: No results found for this basename: CKTOTAL, CKMB, CKMBINDEX, TROPONINI,  in the last 168 hours BNP: No components  found with this basename: POCBNP,  CBG:  Recent Labs Lab 11/22/13 0741  GLUCAP 307*    No results found for this or any previous visit (from the past 240 hour(s)).   Studies: Dg Chest 2 View 11/22/2013     IMPRESSION: No acute cardiopulmonary disease. Post radiation fibrosis and bronchiectasis involving the left upper lobe, unchanged. COPD/emphysema.      Scheduled Meds: . albuterol  2.5 mg Nebulization QID  . atenolol  50 mg Oral Daily  . azithromycin  500 mg Oral QHS  . cholecalciferol  1,000 Units Oral Daily  . heparin  5,000 Units Subcutaneous Q8H  . insulin aspart  0-9 Units Subcutaneous TID WC  . ipratropium  0.5 mg Nebulization QID  . irbesartan  150 mg Oral Daily  . levothyroxine  50 mcg Oral QAC breakfast  . loratadine  10 mg Oral Daily  . oseltamivir  75 mg Oral BID

## 2013-11-22 NOTE — ED Notes (Signed)
Bed: WA21 Expected date: 11/22/13 Expected time: 12:20 AM Means of arrival: Ambulance Comments: Short of breath

## 2013-11-22 NOTE — Progress Notes (Signed)
ANTICOAGULATION CONSULT NOTE - Initial Consult  Pharmacy Consult for IV heparin Indication: r/o PE/DVT  Allergies  Allergen Reactions  . Codeine   . Sulfamethoxazole-Trimethoprim     REACTION: rash    Patient Measurements: Height: 5\' 4"  (162.6 cm) Weight: 121 lb 4.1 oz (55 kg) IBW/kg (Calculated) : 54.7 Heparin Dosing Weight: 55 kg  Vital Signs: Temp: 97.4 F (36.3 C) (12/29 1409) Temp src: Oral (12/29 1409) BP: 115/66 mmHg (12/29 1409) Pulse Rate: 80 (12/29 1409)  Labs:  Recent Labs  11/22/13 0124  HGB 11.5*  HCT 34.4*  PLT 219  CREATININE 1.56*    Assessment: 29 yoF with h/o lung cancer presented 12/29 with c/o asthma, COPD, SOB. CXR with no acute cardiopulmonary dz. MD treating empirically for AECOPD vs PNA. CCM on board, ruling out DVT/PE, ordered to start IV heparin. Awaiting on results of venous duplex and VQ scan. Scr 1.56 for CrCl 24 ml/min, heparin dosing weight 55kg, CBC wnl, no bleeding. Pt has sq heparin 5000 units q8h ordered, last dose @ 1400.   Goal of Therapy:  Heparin level 0.3-0.7 units/ml Monitor platelets by anticoagulation protocol: Yes   Plan:   No heparin bolus  Start IV heparin at 900 units/hr   Heparin level at [place], PharmD, BCPS Pager: [phone] 5:54 PM Pharmacy #: [id number]

## 2013-11-22 NOTE — ED Notes (Signed)
[place] EDP given CG4 Lactic results.

## 2013-11-22 NOTE — Plan of Care (Signed)
Problem: Phase I Progression Outcomes Goal: Pain controlled with appropriate interventions Outcome: Completed/Met Date Met:  11/22/13 Denies pain

## 2013-11-23 DIAGNOSIS — R0602 Shortness of breath: Secondary | ICD-10-CM

## 2013-11-23 DIAGNOSIS — I369 Nonrheumatic tricuspid valve disorder, unspecified: Secondary | ICD-10-CM

## 2013-11-23 DIAGNOSIS — I82409 Acute embolism and thrombosis of unspecified deep veins of unspecified lower extremity: Secondary | ICD-10-CM

## 2013-11-23 DIAGNOSIS — D638 Anemia in other chronic diseases classified elsewhere: Secondary | ICD-10-CM | POA: Diagnosis not present

## 2013-11-23 DIAGNOSIS — J441 Chronic obstructive pulmonary disease with (acute) exacerbation: Secondary | ICD-10-CM | POA: Diagnosis not present

## 2013-11-23 DIAGNOSIS — J9600 Acute respiratory failure, unspecified whether with hypoxia or hypercapnia: Secondary | ICD-10-CM | POA: Diagnosis not present

## 2013-11-23 DIAGNOSIS — I2699 Other pulmonary embolism without acute cor pulmonale: Principal | ICD-10-CM

## 2013-11-23 DIAGNOSIS — R0902 Hypoxemia: Secondary | ICD-10-CM

## 2013-11-23 DIAGNOSIS — C3490 Malignant neoplasm of unspecified part of unspecified bronchus or lung: Secondary | ICD-10-CM | POA: Diagnosis not present

## 2013-11-23 LAB — BASIC METABOLIC PANEL
BUN: 26 mg/dL — ABNORMAL HIGH (ref 6–23)
CO2: 22 mEq/L (ref 19–32)
Calcium: 9.4 mg/dL (ref 8.4–10.5)
Creatinine, Ser: 1.48 mg/dL — ABNORMAL HIGH (ref 0.50–1.10)
GFR calc non Af Amer: 32 mL/min — ABNORMAL LOW (ref >90)
Glucose, Bld: 171 mg/dL — ABNORMAL HIGH (ref 70–99)
Potassium: 5.2 mEq/L (ref 3.7–5.3)

## 2013-11-23 LAB — CBC
Hemoglobin: 10.5 g/dL — ABNORMAL LOW (ref 12.0–15.0)
MCH: 32 pg (ref 26.0–34.0)
MCV: 97 fL (ref 78.0–100.0)
RBC: 3.28 MIL/uL — ABNORMAL LOW (ref 3.87–5.11)

## 2013-11-23 MED ORDER — WARFARIN - PHARMACIST DOSING INPATIENT: Freq: Every day | Status: DC

## 2013-11-23 MED ORDER — WARFARIN VIDEO: Freq: Once | Status: DC

## 2013-11-23 MED ORDER — WARFARIN SODIUM 2.5 MG PO TABS
2.5000 mg | ORAL_TABLET | Freq: Once | ORAL | Status: AC
  Administered 2013-11-23: 2.5 mg via ORAL
  Filled 2013-11-23: qty 1

## 2013-11-23 MED ORDER — ENOXAPARIN SODIUM 60 MG/0.6ML ~~LOC~~ SOLN
1.0000 mg/kg | SUBCUTANEOUS | Status: DC
  Administered 2013-11-23 – 2013-11-25 (×3): 55 mg via SUBCUTANEOUS
  Filled 2013-11-23 (×3): qty 0.6

## 2013-11-23 MED ORDER — IPRATROPIUM-ALBUTEROL 0.5-2.5 (3) MG/3ML IN SOLN
3.0000 mL | Freq: Four times a day (QID) | RESPIRATORY_TRACT | Status: DC
  Administered 2013-11-23 – 2013-11-25 (×9): 3 mL via RESPIRATORY_TRACT
  Filled 2013-11-23 (×10): qty 3

## 2013-11-23 MED ORDER — COUMADIN BOOK
Freq: Once | Status: AC
  Administered 2013-11-23: 19:00:00
  Filled 2013-11-23: qty 1

## 2013-11-23 MED ORDER — SODIUM CHLORIDE 0.9 % IV BOLUS (SEPSIS)
500.0000 mL | Freq: Once | INTRAVENOUS | Status: AC
  Administered 2013-11-23: 500 mL via INTRAVENOUS

## 2013-11-23 NOTE — Care Management Note (Signed)
Pt from SNF per MD. CSW to follow.  11/23/13 [place]

## 2013-11-23 NOTE — Progress Notes (Signed)
Clinical Social Work Department BRIEF PSYCHOSOCIAL ASSESSMENT 11/23/2013  Patient:  Valerie Young, Valerie Young     Account Number:  [id number]     Admit date:  11/22/2013  Clinical Social Worker:  Ulyess Blossom  Date/Time:  11/23/2013 02:30 PM  Referred by:  Physician  Date Referred:  11/23/2013 Referred for  SNF Placement   Other Referral:   Interview type:  Patient Other interview type:    PSYCHOSOCIAL DATA Living Status:  ALONE Admitted from facility:   Level of care:   Primary support name:  April Creswell/daughter/[phone] Primary support relationship to patient:  CHILD, ADULT Degree of support available:   unknown at this time, no family present at bedside    CURRENT CONCERNS Current Concerns  Post-Acute Placement   Other Concerns:    [place] / PLAN CSW received referral for New SNF placement.    CSW met with pt at bedside. CSW introduced self and explained role.CSW discussed with pt about recommendation for SNF for rehab. Pt discussed that she feels that this will be most beneficial to her as she lives alone and does not have 24 hour care at home. Pt agreeable to [facility] search.    CSW completed FL2 and initiated SNF search to [facility].    CSW to follow up with pt re: SNF bed offers.    CSW to continue to follow and facilitate pt discharge needs when pt medically ready for discharge.   Assessment/plan status:  Psychosocial Support/Ongoing Assessment of Needs Other assessment/ plan:   discharge planning   Information/referral to community resources:   [facility] list    PATIENT'S/FAMILY'S RESPONSE TO PLAN OF CARE: Pt alert and oriented x 4. Pt recognizes that she would not be able to manage at home at this time. Pt agreeable to SNF search and appears motivated for rehab at discharge.     Drake Leach, MSW, [place] Social Work [phone]

## 2013-11-23 NOTE — Progress Notes (Signed)
[facility] PROGRESS NOTE  Valerie Young [id number] DOB: [birth year] DOA: 11/22/2013 PCP: Angelica Chessman, MD  Brief narrative: 79 y.o. female with past medical history of lung adenocarcinoma status post resection and recurrence, CKD, COPD, hypothyroidism who presented to [facility] ED 11/22/2013 with worsening shortness of breath and cough for past few days prior to this admission. Pt also reported subjective fevers, fatigue, myalgia. Pt was found to have negative flu test but is being treated for possible community acquired pneumonia and COPD exacerbation.   Assessment/Plan:   Principal Problem:  Acute respiratory failure with hypoxia  - severely hypoxic on the admission, 76% on 2 L nasal canula  - remains stable respiratory wise - likely due to COPD exacerbation and CAP - continue treatment with nebulizer treatments, antibiotics and steroids (prednisone PO)  - oxygen support via nasal canula to keep O2 saturation above 90%  Active Problems:  CAP (community acquired pneumonia)  - continue azithromycin and rocephin  - blood cultures not obtained at the time of the admission  - strep pneumo and flu test negative - legionella pending  COPD exacerbation  - treatment as above, nebulizers, antibiotics, prednisone  HYPERTENSION  - continue atenolol, avapro  Chronic kidney disease (CKD), stage IV (severe)  - creatinine 1.24 even in 2009  - continue to monitor, on the admission creatinine was 1.56 and then 1.48 this am Leukocytosis, unspecified  - due to CAP  - continue azithromycin and rocephin Anemia of chronic disease  - secondary to history of lung ca  - hemoglobin 11.5 on the admission, no indications for transfusion  HYPOTHYROIDISM  - continue synthroid   Code Status: full code  Family Communication: no family at the bedside  Disposition Plan: to SNF once bed available; INR can be monitored in SNF as well  Consultants:  None  Procedures:  None  Antibiotics:   Azithromycin 11/22/2013 -->  Rocephin 11/22/2013 -->  Oseltamivir 11/22/2013 --> 11/22/2013   [name], MD  [facility] Pager [phone]  If 7PM-7AM, please contact night-coverage [email or web address] Password [email or web address] 11/23/2013, 1:56 PM   LOS: 1 day    HPI/Subjective: No acute overnight events.   Objective: Filed Vitals:   11/23/13 0604 11/23/13 0614 11/23/13 0655 11/23/13 0832  BP: 88/51 85/49 103/54   Pulse: 18     Temp: 98.1 F (36.7 C)     TempSrc: Oral     Resp: 19     Height:      Weight:      SpO2: 100%   97%    Intake/Output Summary (Last 24 hours) at 11/23/13 1356 Last data filed at 11/23/13 0600  Gross per 24 hour  Intake    120 ml  Output      0 ml  Net    120 ml    Exam:   General:  Pt is alert, follows commands appropriately, not in acute distress  Cardiovascular: Regular rate and rhythm, S1/S2 appreciated   Respiratory: Clear to auscultation bilaterally, no wheezing, no crackles, no rhonchi  Abdomen: Soft, non tender, non distended, bowel sounds present, no guarding  Extremities: No edema, pulses DP and PT palpable bilaterally  Neuro: Grossly nonfocal  Data Reviewed: Basic Metabolic Panel:  Recent Labs Lab 11/22/13 0124 11/23/13 0155  NA 136 140  K 5.1 5.2  CL 100 105  CO2 21 22  GLUCOSE 233* 171*  BUN 21 26*  CREATININE 1.56* 1.48*  CALCIUM 9.7 9.4   Liver Function Tests: No results found for this basename:  AST, ALT, ALKPHOS, BILITOT, PROT, ALBUMIN,  in the last 168 hours No results found for this basename: LIPASE, AMYLASE,  in the last 168 hours No results found for this basename: AMMONIA,  in the last 168 hours CBC:  Recent Labs Lab 11/22/13 0124 11/23/13 0155  WBC 13.8* 18.1*  NEUTROABS 11.9*  --   HGB 11.5* 10.5*  HCT 34.4* 31.8*  MCV 96.9 97.0  PLT 219 193   Cardiac Enzymes: No results found for this basename: CKTOTAL, CKMB, CKMBINDEX, TROPONINI,  in the last 168 hours BNP: No components found with this  basename: POCBNP,  CBG:  Recent Labs Lab 11/22/13 0741 11/22/13 1129 11/22/13 1642 11/22/13 2103  GLUCAP 307* 203* 180* 153*    No results found for this or any previous visit (from the past 240 hour(s)).   Studies: Dg Chest 2 View  11/22/2013   CLINICAL DATA:  Shortness of breath. Prior adenocarcinoma of the left upper lobe for which the patient underwent radiation therapy.  EXAM: CHEST  2 VIEW  COMPARISON:  Two-view chest x-ray 11/05/2013. CT chest 08/11/2013, 04/22/2013. PET-CT 12/21/2012.  FINDINGS: Post radiation fibrosis and bronchiectasis involving the left upper lobe, unchanged. Surgical clips and surgical suture material medially and laterally in the right upper lobe, arm presumably from prior bullectomy. Emphysematous changes throughout both lungs with hyperinflation. No new pulmonary parenchymal abnormalities. No pleural effusions.  Cardiac silhouette upper normal in size, unchanged. Thoracic aorta atherosclerotic, unchanged. Hilar and mediastinal contours otherwise unremarkable. Degenerative changes involving the thoracic spine.  IMPRESSION: No acute cardiopulmonary disease. Post radiation fibrosis and bronchiectasis involving the left upper lobe, unchanged. COPD/emphysema.   Electronically Signed   By: Evangeline Dakin M.D.   On: 11/22/2013 02:00   Nm Pulmonary Perf And Vent  11/22/2013   CLINICAL DATA:  History of lung cancer, post radiation fibrosis, COPD, now with hypoxia, evaluate for pulmonary embolism  EXAM: NUCLEAR MEDICINE VENTILATION - PERFUSION LUNG SCAN  TECHNIQUE: Ventilation images were obtained in multiple projections using inhaled aerosol technetium 99 M DTPA. Perfusion images were obtained in multiple projections after intravenous injection of Tc-86m MAA.  COMPARISON:  Chest radiograph - 11/22/2013; 12/03/2012; chest CT - 08/11/2013  RADIOPHARMACEUTICALS:  35 mCi Tc-26m DTPA aerosol and 5 mCi Tc-26m MAA  FINDINGS: Review of chest radiograph performed earlier same day  demonstrates grossly unchanged cardiac silhouette and mediastinal contours with atherosclerotic plaque within the thoracic aorta. Stable postsurgical change of the right hilum. There has been worsening volume loss and associated consolidation involving primarily the left upper lobe. The lungs remain hyperexpanded. There is mild eventration of the medial aspect of the right hemidiaphragm. No pleural effusion or pneumothorax. No evidence of edema  Ventilation: There is marked clumping of inhaled radiotracer about the pulmonary hila. There is relative decreased ventilation of the left upper lobe with preferential ventilation of the right mid and lower lung. A large amount of ingested radiotracer seen within in the mouth, hypopharynx and superior aspect of the esophagus.  Perfusion: There are relatively large apparent mismatched perfusion defects involving the posterior, lateral and anterior basilar segments of the right lower lobe worrisome for pulmonary embolism.  IMPRESSION: Large mismatched perfusion deficit involving the right lower lobe worrisome for pulmonary embolism, though admittedly, the examination is difficult to interpret secondary to marked retention of inhaled radiotracer about the pulmonary hila and extensive postsurgical and radiation change of the lungs. Further evaluation could be performed with contrast-enhanced chest CT and/or bilateral lower extremity venous Doppler ultrasound as clinically indicated.  Critical Value/emergent results were called by telephone at the time of interpretation on 11/22/2013 at 10:20 PM to [place], RN, who verbally acknowledged these results.   Electronically Signed   By: Sandi Mariscal M.D.   On: 11/22/2013 22:22    Scheduled Meds: . atenolol  50 mg Oral Daily  . azithromycin  500 mg Oral QHS  . cefTRIAXone (ROCEPHIN)  IV  1 g Intravenous Q24H  . cholecalciferol  1,000 Units Oral Daily  . coumadin book   Does not apply Once  . enoxaparin (LOVENOX) injection  1  mg/kg Subcutaneous Q24H  . feeding supplement (ENSURE COMPLETE)  237 mL Oral BID BM  . ipratropium-albuterol  3 mL Nebulization QID  . levothyroxine  50 mcg Oral QAC breakfast  . loratadine  10 mg Oral Daily  . predniSONE  40 mg Oral Q breakfast  . warfarin  2.5 mg Oral ONCE-1800  . warfarin   Does not apply Once  . Warfarin - Pharmacist Dosing Inpatient   Does not apply q1800   Continuous Infusions:

## 2013-11-23 NOTE — Progress Notes (Signed)
ANTICOAGULATION CONSULT NOTE - Initial Consult  Pharmacy Consult for IV heparin - change to Lovenox Indication: PE  Allergies  Allergen Reactions  . Codeine   . Sulfamethoxazole-Trimethoprim     REACTION: rash    Patient Measurements: Height: 5\' 4"  (162.6 cm) Weight: 121 lb 4.1 oz (55 kg) IBW/kg (Calculated) : 54.7 Heparin Dosing Weight: 55 kg  Vital Signs: Temp: 98.1 F (36.7 C) (12/30 0604) Temp src: Oral (12/30 0604) BP: 103/54 mmHg (12/30 0655) Pulse Rate: 18 (12/30 0604)  Labs:  Recent Labs  11/22/13 0124 11/22/13 1823 11/23/13 0155  HGB 11.5*  --  10.5*  HCT 34.4*  --  31.8*  PLT 219  --  193  APTT  --  32  --   LABPROT  --  14.3  --   INR  --  1.13  --   HEPARINUNFRC  --   --  0.54  CREATININE 1.56*  --  1.48*    Assessment: 80 yoF with h/o lung cancer presented 12/29 with c/o asthma, COPD, SOB. CXR with no acute cardiopulmonary dz. MD treating empirically for AECOPD vs PNA. CCM on board, ruling out DVT/PE on 12/29, ordered to start IV heparin. Awaiting on results of venous duplex and VQ scan. Scr 1.56 for CrCl 24 ml/min, heparin dosing weight 55kg, CBC wnl, no bleeding. Pt has sq heparin 5000 units q8h ordered, last dose @ 1400 on 12/29 before d/c  VQ confirms likelihood of PE  To change IV heparin to full dose Lovenox at this time per Md order  Still with renal dysfunction - estimated CrCl about 25 ml/min  Stable CBC  No reported bleeding  Goal of Therapy:  Heparin level 0.6-1.2 Monitor platelets by anticoagulation protocol: Yes   Plan:  1) 1 hr after stopping IV heparin, start Lovenox 1mg /kg SQ q24 due to CrCl < 30 ml/min 2) Monitor SCr and CBC closely   Adrian Saran, PharmD, BCPS Pager [phone] 11/23/2013 7:58 AM

## 2013-11-23 NOTE — Progress Notes (Signed)
On-call Baltazar Najjar notified on low BP, new orders received

## 2013-11-23 NOTE — Progress Notes (Signed)
ANTICOAGULATION CONSULT NOTE - Initial Consult  Pharmacy Consult for warfarin - D1 overlap with Lovenox Indication: pulmonary embolus and DVT  Allergies  Allergen Reactions  . Codeine   . Sulfamethoxazole-Trimethoprim     REACTION: rash    Patient Measurements: Height: 5\' 4"  (162.6 cm) Weight: 121 lb 4.1 oz (55 kg) IBW/kg (Calculated) : 54.7  Vital Signs: Temp: 98.1 F (36.7 C) (12/30 0604) Temp src: Oral (12/30 0604) BP: 103/54 mmHg (12/30 0655) Pulse Rate: 18 (12/30 0604)  Labs:  Recent Labs  11/22/13 0124 11/22/13 1823 11/23/13 0155  HGB 11.5*  --  10.5*  HCT 34.4*  --  31.8*  PLT 219  --  193  APTT  --  32  --   LABPROT  --  14.3  --   INR  --  1.13  --   HEPARINUNFRC  --   --  0.54  CREATININE 1.56*  --  1.48*    Estimated Creatinine Clearance: 25.3 ml/min (by C-G formula based on Cr of 1.48).   Medical History: Past Medical History  Diagnosis Date  . Hypertension   . Hypothyroidism   . COPD (chronic obstructive pulmonary disease)   . DJD (degenerative joint disease)     knees  . PND (post-nasal drip)     Chronic  . H/O: lung cancer 1982    Right; treated with resection  . Cancer     right lung ca - 30 years ago / left lung - 11/2012    Medications:  Scheduled:  . atenolol  50 mg Oral Daily  . azithromycin  500 mg Oral QHS  . cefTRIAXone (ROCEPHIN)  IV  1 g Intravenous Q24H  . cholecalciferol  1,000 Units Oral Daily  . enoxaparin (LOVENOX) injection  1 mg/kg Subcutaneous Q24H  . feeding supplement (ENSURE COMPLETE)  237 mL Oral BID BM  . ipratropium-albuterol  3 mL Nebulization QID  . levothyroxine  50 mcg Oral QAC breakfast  . loratadine  10 mg Oral Daily  . predniSONE  40 mg Oral Q breakfast   Infusions:    Assessment: 79 yo female known to pharmacy already after starting IV heparin yesterday for PE and now DVT and changed to full dose Lovenox this AM. Now to start warfarin per pharmacy dosing. Today will be Day 1 of min 5 day overlap  with Lovenox. Baseline INR WNL and CBC stable. No reported bleeding currently  Goal of Therapy:  INR 2-3 Monitor platelets by anticoagulation protocol: Yes   Plan:  1) Warfarin 2.5mg  x 1 tonight 2) Daily INR 3) Warfarin education booklet and watch video   Adrian Saran, PharmD, BCPS Pager [phone] 11/23/2013 10:16 AM

## 2013-11-23 NOTE — Progress Notes (Signed)
Echocardiogram 2D Echocardiogram has been performed.  Legend Tumminello 11/23/2013, 12:21 PM

## 2013-11-23 NOTE — Progress Notes (Signed)
Clinical Social Work Department CLINICAL SOCIAL WORK PLACEMENT NOTE 11/23/2013  Patient:  Valerie Young, Valerie Young  Account Number:  [id number] Admit date:  11/22/2013  Clinical Social Worker:  Ulyess Blossom  Date/time:  11/23/2013 03:26 PM  Clinical Social Work is seeking post-discharge placement for this patient at the following level of care:   SKILLED NURSING   (*CSW will update this form in Epic as items are completed)   11/23/2013  Patient/family provided with [place] Department of Clinical Social Work's list of facilities offering this level of care within the geographic area requested by the patient (or if unable, by the patient's family).  11/23/2013  Patient/family informed of their freedom to choose among providers that offer the needed level of care, that participate in Medicare, Medicaid or managed care program needed by the patient, have an available bed and are willing to accept the patient.  11/23/2013  Patient/family informed of [facility]' ownership interest in [facility], as well as of the fact that they are under no obligation to receive care at this facility.  PASARR submitted to [employer]DS on 11/23/2013 PASARR number received from [place] on 11/23/2013-existing pasarr  FL2 transmitted to all facilities in geographic area requested by pt/family on  11/23/2013 FL2 transmitted to all facilities within larger geographic area on   Patient informed that his/her managed care company has contracts with or will negotiate with  certain facilities, including the following:     Patient/family informed of bed offers received:   Patient chooses bed at  Physician recommends and patient chooses bed at    Patient to be transferred to  on   Patient to be transferred to facility by   The following physician request were entered in Epic:   Additional Comments:    Drake Leach, MSW, [place] Work [phone]

## 2013-11-23 NOTE — Progress Notes (Signed)
OT Cancellation Note  Patient Details Name: Valerie Young MRN: [id number] DOB: [birth year]   Cancelled Treatment:    Reason Eval/Treat Not Completed: Medical issues which prohibited therapy (new onset PE, just started on Lovenox today)  Jaelyne Deeg A 11/23/2013, 2:20 PM

## 2013-11-23 NOTE — Progress Notes (Signed)
Name: Valerie Young MRN: [id number] DOB: 07-01-1929    ADMISSION DATE:  11/22/2013 CONSULTATION DATE:  11/23/2013  REFERRING MD :  Marcheta Grammes  CHIEF COMPLAINT:  Sob, hypoxia   HISTORY OF PRESENT ILLNESS:  79 y.o.ex smoker presented to [facility] ED 11/22/2013 with worsening shortness of breath and cough for few days . Pt also reported subjective fevers, fatigue, myalgia. severely hypoxic on the admission, 76% on 2 L nasal canula. Rapid flu test neg, she was treated as CAP & COPD excaerbation. We are consulted for hypoxia.  Last seen dec 12 by dr clance for recurrentcough. This was felt secondary to an ACE inhibitor, and also possibly do to radiation pneumonitis. She was changed to an ARB, and also treated with a course of prednisone.  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  Complicating health problems have included chronic kidney disease stage III, thyroid surgery for goiter. No hx TB exposure.  Limited mobility due to rt knee osteoarthritis   SUBJECTIVE:  Pt less dyspneic.  Venous doppler U/S  Pos DVT RLE  V/Q pos PE VITAL SIGNS: Temp:  [97.4 F (36.3 C)-98.7 F (37.1 C)] 98.1 F (36.7 C) (12/30 0604) Pulse Rate:  [18-80] 18 (12/30 0604) Resp:  [19-20] 19 (12/30 0604) BP: (85-115)/(49-66) 103/54 mmHg (12/30 0655) SpO2:  [90 %-100 %] 97 % (12/30 0832)  PHYSICAL EXAMINATION: Gen. Frail, chronically ill,, well-nourished, in mild distress, normal affect ENT - no lesions, no post nasal drip Neck: No JVD, no thyromegaly, no carotid bruits Lungs: mild use of accessory muscles with exertion, no dullness to percussion, clear without rales or rhonchi , bronchial breathing LUL Cardiovascular: Rhythm regular, heart sounds  normal, no murmurs, no peripheral edema Abdomen: soft and  non-tender, no hepatosplenomegaly, BS normal. Musculoskeletal: No deformities, no cyanosis or clubbing Neuro:  alert, non focal Skin:  Warm, no lesions/ rash    Recent Labs Lab 11/22/13 0124 11/23/13 0155  NA 136 140  K 5.1 5.2  CL 100 105  CO2 21 22  BUN 21 26*  CREATININE 1.56* 1.48*  GLUCOSE 233* 171*    Recent Labs Lab 11/22/13 0124 11/23/13 0155  HGB 11.5* 10.5*  HCT 34.4* 31.8*  WBC 13.8* 18.1*  PLT 219 193   Dg Chest 2 View  11/22/2013   CLINICAL DATA:  Shortness of breath. Prior adenocarcinoma of the left upper lobe for which the patient underwent radiation therapy.  EXAM: CHEST  2 VIEW  COMPARISON:  Two-view chest x-ray 11/05/2013. CT chest 08/11/2013, 04/22/2013. PET-CT 12/21/2012.  FINDINGS: Post radiation fibrosis and bronchiectasis involving the left upper lobe, unchanged. Surgical clips and surgical suture material medially and laterally in the right upper lobe, arm presumably from prior bullectomy. Emphysematous changes throughout both lungs with hyperinflation. No new pulmonary parenchymal abnormalities. No pleural effusions.  Cardiac silhouette upper normal in size, unchanged. Thoracic aorta atherosclerotic, unchanged. Hilar and mediastinal contours otherwise unremarkable. Degenerative changes involving the thoracic spine.  IMPRESSION: No acute cardiopulmonary disease. Post radiation fibrosis and bronchiectasis involving the left upper lobe, unchanged. COPD/emphysema.   Electronically Signed   By: Evangeline Dakin M.D.   On: 11/22/2013 02:00   Nm Pulmonary Perf And Vent  11/22/2013   CLINICAL DATA:  History of lung cancer, post radiation fibrosis, COPD, now with hypoxia, evaluate for  pulmonary embolism  EXAM: NUCLEAR MEDICINE VENTILATION - PERFUSION LUNG SCAN  TECHNIQUE: Ventilation images were obtained in multiple projections using inhaled aerosol technetium 99 M DTPA. Perfusion images were obtained in multiple projections after intravenous injection of Tc-19m  MAA.  COMPARISON:  Chest radiograph - 11/22/2013; 12/03/2012; chest CT - 08/11/2013  RADIOPHARMACEUTICALS:  35 mCi Tc-16m DTPA aerosol and 5 mCi Tc-53m MAA  FINDINGS: Review of chest radiograph performed earlier same day demonstrates grossly unchanged cardiac silhouette and mediastinal contours with atherosclerotic plaque within the thoracic aorta. Stable postsurgical change of the right hilum. There has been worsening volume loss and associated consolidation involving primarily the left upper lobe. The lungs remain hyperexpanded. There is mild eventration of the medial aspect of the right hemidiaphragm. No pleural effusion or pneumothorax. No evidence of edema  Ventilation: There is marked clumping of inhaled radiotracer about the pulmonary hila. There is relative decreased ventilation of the left upper lobe with preferential ventilation of the right mid and lower lung. A large amount of ingested radiotracer seen within in the mouth, hypopharynx and superior aspect of the esophagus.  Perfusion: There are relatively large apparent mismatched perfusion defects involving the posterior, lateral and anterior basilar segments of the right lower lobe worrisome for pulmonary embolism.  IMPRESSION: Large mismatched perfusion deficit involving the right lower lobe worrisome for pulmonary embolism, though admittedly, the examination is difficult to interpret secondary to marked retention of inhaled radiotracer about the pulmonary hila and extensive postsurgical and radiation change of the lungs. Further evaluation could be performed with contrast-enhanced chest CT and/or bilateral lower extremity venous Doppler ultrasound as clinically indicated.  Critical Value/emergent results were called by telephone at the time of interpretation on 11/22/2013 at 10:20 PM to [place], RN, who verbally acknowledged these results.   Electronically Signed   By: Sandi Mariscal M.D.   On: 11/22/2013 22:22    ASSESSMENT / PLAN:  Pulmonary  embolism RLL.  DVT RLE. PLAN: On heparin drip. Ok to change to lovenox SQ and bridge with coumadin.  Needs 5days of hep/lovenox rx and 2day overlap therapeutic coumadin and lovenox  We can rx with coumadin per [facility] plus dr Gwenette Greet can follow for PE/DVT.    COPD, LUL radiation pneumonitis / fibrosis- FEV1 1.51 -80%  Ok to use prednisone , this has relieved her cough in the past Agree with CAP coverage  Discussed with pt   Mariel Sleet  Pulmonary and [place]  [phone]  Cell  [phone]  If no response or cell goes to voicemail, call beeper [phone]   11/23/2013, 9:36 AM

## 2013-11-23 NOTE — Progress Notes (Addendum)
Bilateral lower extremity venous duplex completed.  Right:  DVT noted in the posterior tibial and peroneal veins.  Possible DVT noted in the mid femoral vein.  No evidence of superficial thrombosis.  No Baker's cyst.  Left:  No evidence of DVT, superficial thrombosis, or Baker's cyst.

## 2013-11-23 NOTE — Evaluation (Signed)
Physical Therapy Evaluation Patient Details Name: Valerie Young MRN: [id number] DOB: June 21, 1929 Today's Date: 11/23/2013 Time: [id number] PT Time Calculation (min): 13 min  PT Assessment / Plan / Recommendation History of Present Illness  Valerie Young is a 79 y.o. female with h/o lung CA x2 currently in remission.  Presents to the ED with SOB.  Symptoms onset 4 days ago, worsening, associated with dry cough and runny nose.  No myalgias, not sure that shes had any fevers.  Not normally on O2 at home but requiring O2 here.  Symptoms improved after albuterol EMS treatment but still short of breath.. Pt dx; with PE and R leg DVT.  Clinical Impression  Pt is very limited with mobility due to dyspnea. Sats appeared to drop into 70's on 4 l. Pt will benefit from PT to address problems. Pt lives alone. Recommend SNF unless pt can get 24/7 caregivers.    PT Assessment  Patient needs continued PT services    Follow Up Recommendations  SNF;Supervision/Assistance - 24 hour    Does the patient have the potential to tolerate intense rehabilitation      Barriers to Discharge Decreased caregiver support      Equipment Recommendations   (tbd)    Recommendations for Other Services     Frequency Min 3X/week    Precautions / Restrictions Precautions Precautions: Fall Precaution Comments: severe dyspnea, on O2.   Pertinent Vitals/Pain sats  Pre 94% 4 l 76% during mobility on 4 l. HR 90's      Mobility  Bed Mobility Bed Mobility: Supine to Sit;Sitting - Scoot to Edge of Bed;Sit to Supine Supine to Sit: 3: Mod assist Sitting - Scoot to Edge of Bed: 3: Mod assist Sit to Supine: 3: Mod assist Details for Bed Mobility Assistance: extra time for mobilizing, frequent rest breaks to get breath. Transfers Transfers: Not assessed Ambulation/Gait Ambulation/Gait Assistance: Not tested (comment)    Exercises     PT Diagnosis: Difficulty walking;Generalized weakness  PT Problem List:  Decreased strength;Decreased activity tolerance;Decreased mobility;Decreased knowledge of use of DME;Decreased safety awareness;Decreased knowledge of precautions;Cardiopulmonary status limiting activity PT Treatment Interventions: DME instruction;Gait training;Functional mobility training;Therapeutic activities;Therapeutic exercise;Patient/family education     PT Goals(Current goals can be found in the care plan section) Acute Rehab PT Goals Patient Stated Goal: I want to go home and get more hurs for my aide PT Goal Formulation: With patient Time For Goal Achievement: 12/07/13 Potential to Achieve Goals: Fair  Visit Information  Last PT Received On: 11/23/13 Assistance Needed: +2 History of Present Illness: Valerie Young is a 79 y.o. female with h/o lung CA x2 currently in remission.  Presents to the ED with SOB.  Symptoms onset 4 days ago, worsening, associated with dry cough and runny nose.  No myalgias, not sure that shes had any fevers.  Not normally on O2 at home but requiring O2 here.  Symptoms improved after albuterol EMS treatment but still short of breath.. Pt dx; with PE and R leg DVT.       Prior [place] expects to be discharged to:: Private residence Living Arrangements: Alone Available Help at Discharge: Personal care attendant Type of Home: House Home Equipment: None Prior Function Level of Independence: Independent Communication Communication:  (severe dyspnea to speak.)    Cognition  Cognition Arousal/Alertness: Awake/alert Behavior During Therapy: WFL for tasks assessed/performed Overall Cognitive Status: Within Functional Limits for tasks assessed    Extremity/Trunk Assessment Upper Extremity Assessment Upper Extremity  Assessment: Generalized weakness Lower Extremity Assessment Lower Extremity Assessment: Generalized weakness Cervical / Trunk Assessment Cervical / Trunk Assessment: Kyphotic   Balance Balance Balance  Assessed: Yes Static Sitting Balance Static Sitting - Balance Support: Bilateral upper extremity supported Static Sitting - Level of Assistance: 4: Min assist Static Sitting - Comment/# of Minutes: x 2 minutes, too SOB to remain sitting.  End of Session PT - End of Session Activity Tolerance: Treatment limited secondary to medical complications (Comment) (dyspnea) Patient left: in bed;with call bell/phone within reach Nurse Communication: Mobility status  GP     Claretha Cooper 11/23/2013, 1:16 PM Tresa Endo PT [phone]

## 2013-11-24 DIAGNOSIS — I2699 Other pulmonary embolism without acute cor pulmonale: Secondary | ICD-10-CM | POA: Diagnosis not present

## 2013-11-24 DIAGNOSIS — J9600 Acute respiratory failure, unspecified whether with hypoxia or hypercapnia: Secondary | ICD-10-CM | POA: Diagnosis not present

## 2013-11-24 DIAGNOSIS — D638 Anemia in other chronic diseases classified elsewhere: Secondary | ICD-10-CM | POA: Diagnosis not present

## 2013-11-24 LAB — CBC
HCT: 29.6 % — ABNORMAL LOW (ref 36.0–46.0)
Hemoglobin: 9.3 g/dL — ABNORMAL LOW (ref 12.0–15.0)
RDW: 14.1 % (ref 11.5–15.5)
WBC: 12.7 10*3/uL — ABNORMAL HIGH (ref 4.0–10.5)

## 2013-11-24 LAB — PROTIME-INR: INR: 1.07 (ref 0.00–1.49)

## 2013-11-24 MED ORDER — ENOXAPARIN SODIUM 60 MG/0.6ML ~~LOC~~ SOLN: 1.0000 mg/kg | SUBCUTANEOUS | Status: DC

## 2013-11-24 MED ORDER — ENSURE COMPLETE PO LIQD: 237.0000 mL | Freq: Two times a day (BID) | ORAL | Status: DC

## 2013-11-24 MED ORDER — PREDNISONE 5 MG PO TABS: 40.0000 mg | ORAL_TABLET | Freq: Every day | ORAL | Status: DC

## 2013-11-24 MED ORDER — WARFARIN SODIUM 2 MG PO TABS: 2.0000 mg | ORAL_TABLET | Freq: Every day | ORAL | Status: DC

## 2013-11-24 NOTE — Progress Notes (Addendum)
CSW met with pt at bedside.  CSW provided SNF bed offers. Pt interested in [facility] or [facility] and [facility]. Pt plans to discuss with pt daughter in order to make decision.  CSW asked pt to notify CSW as soon as decision is made in order to set up pt discharge for tomorrow to one of the above facilities. Pt discharge information will need to be sent to facility today.  CSW to follow up with pt re: decision in order to assist with arranging pt discharge needs for tomorrow.  Addendum 11:35 am:   CSW received return phone call from pt and pt initially was hopeful for [facility]. CSW contacted [facility] and facility stated that they would not be able to accept pt for admission tomorrow.  CSW met with pt at bedside to discuss. CSW discussed that per MD, pt will be medically ready for discharge tomorrow and there is not a medical reason to keep pt in the hospital until Friday.  Pt agreeable to [facility] and [facility]. CSW contacted [facility] and facility is able to accept pt tomorrow and will need discharge summary and discharge medications today.   CSW notified MD.  CSW to fax discharge paperwork today in order for discharge to be facilitated tomorrow.    Drake Leach, MSW, [facility] [phone]

## 2013-11-24 NOTE — Evaluation (Signed)
Occupational Therapy Evaluation Patient Details Name: Valerie Young MRN: [id number] DOB: [birth year] Today's Date: 11/24/2013 Time: [id number] OT Time Calculation (min): 15 min  OT Assessment / Plan / Recommendation History of present illness Valerie Young is a 79 y.o. female with h/o lung CA x2 currently in remission.  Presents to the ED with SOB.  Symptoms onset 4 days ago, worsening, associated with dry cough and runny nose.  No myalgias, not sure that shes had any fevers.  Not normally on O2 at home but requiring O2 here.  Symptoms improved after albuterol EMS treatment but still short of breath.. Pt dx; with PE and R leg DVT.   Clinical Impression   Pt is admitted with R DVT and PE.  She will benefit from skilled OT to increase safety and independence with adls.  Pt was independent prior to admission and goals in acute are for supervision to min guard.      OT Assessment  Patient needs continued OT Services    Follow Up Recommendations  SNF;Supervision/Assistance - 24 hour    Barriers to Discharge      Equipment Recommendations  3 in 1 bedside comode    Recommendations for Other Services    Frequency  Min 2X/week    Precautions / Restrictions Precautions Precautions: Fall Precaution Comments: dyspnea:  PE Restrictions Weight Bearing Restrictions: No   Pertinent Vitals/Pain 02 96% on 02.  Dyspnea 2-3 out of 4.  No pain reported    ADL  Grooming: Set up Where Assessed - Grooming: Unsupported sitting Upper Body Bathing: Minimal assistance Where Assessed - Upper Body Bathing: Supported sitting Lower Body Bathing: Moderate assistance Where Assessed - Lower Body Bathing: Supported sit to stand Upper Body Dressing: Minimal assistance Where Assessed - Upper Body Dressing: Supported sitting Lower Body Dressing: Moderate assistance (with AE) Where Assessed - Lower Body Dressing: Supported sit to stand Equipment Used: Reacher;Sock aid Transfers/Ambulation Related to ADLs: Pt  sat eob x 10 minutes.  She stood partially to scoot up bed.  Pt reports she used 3:1 earlier ADL Comments: During eval, pt's dyspnea was 2-3/4.  Sats were 96% on 02.  Cued for pursed lip breathing.  Pt had difficulty with LB adls prior to admission:  (RLE s/p TKA and L stiff).  Educated on reacher and sock aid, and tried sock aid.  Pt fatiques easily    OT Diagnosis: Generalized weakness  OT Problem List: Decreased strength;Decreased activity tolerance;Cardiopulmonary status limiting activity;Decreased knowledge of use of DME or AE OT Treatment Interventions: Self-care/ADL training;DME and/or AE instruction;Energy conservation;Patient/family education   OT Goals(Current goals can be found in the care plan section) Acute Rehab OT Goals Patient Stated Goal: return to being independent:  get home to dog OT Goal Formulation: With patient Time For Goal Achievement: 12/08/13 Potential to Achieve Goals: Good ADL Goals Pt Will Perform Grooming: with supervision;standing Pt Will Transfer to Toilet: with min guard assist;ambulating;bedside commode Pt Will Perform Toileting - Clothing Manipulation and hygiene: with supervision;sit to/from stand Additional ADL Goal #1: pt will perform UB adls with set up from sitting Additional ADL Goal #2: pt will perform LB adls with ae sit to stand with supervision, and initiate at least one break for energy conservation  Visit Information  Last OT Received On: 11/24/13 Assistance Needed: +1 History of Present Illness: Valerie Young is a 79 y.o. female with h/o lung CA x2 currently in remission.  Presents to the ED with SOB.  Symptoms onset 4 days ago,  worsening, associated with dry cough and runny nose.  No myalgias, not sure that shes had any fevers.  Not normally on O2 at home but requiring O2 here.  Symptoms improved after albuterol EMS treatment but still short of breath.. Pt dx; with PE and R leg DVT.       Prior [place] expects to be discharged to:: Private residence Living Arrangements: Alone Additional Comments: sometimes uses a walker outside of the house Prior Function Level of Independence: Independent Communication Communication: No difficulties Dominant Hand: Right         Vision/Perception     Cognition  Cognition Arousal/Alertness: Awake/alert Behavior During Therapy: WFL for tasks assessed/performed Overall Cognitive Status: Within Functional Limits for tasks assessed    Extremity/Trunk Assessment Upper Extremity Assessment Upper Extremity Assessment: Generalized weakness     Mobility Bed Mobility Supine to Sit: 4: Min guard;With rails;HOB flat Sitting - Scoot to Edge of Bed: 4: Min guard;With rail Sit to Supine: 4: Min assist;With rail (extra time) Transfers Details for Transfer Assistance: stood partially to scoot up bed with min guard     Exercise     Balance Static Sitting Balance Static Sitting - Balance Support: No upper extremity supported;Bilateral upper extremity supported Static Sitting - Level of Assistance: 6: Modified independent (Device/Increase time) Static Sitting - Comment/# of Minutes: 10 minutes   End of Session OT - End of Session Activity Tolerance: Patient tolerated treatment well Patient left: in bed;with call bell/phone within reach  [place] 11/24/2013, 1:06 PM Lesle Chris, OTR/L [phone] 11/24/2013

## 2013-11-24 NOTE — Progress Notes (Signed)
Pharmacy: Brief note  Educated Mrs. Aiello on warfarin today.  Explained the use of Lovenox for overlap until her INR is at goal.  She was able to express an understanding of warfarin with the use of Teach back.   I told patient I would be happy to come speak with her daughter later as well if she wanted further education about the use of warfarin.  Please page me if needed or call [phone]  Thanks, Glee Arvin PharmD Pager #: [phone] 12:41 PM 11/24/2013

## 2013-11-24 NOTE — Discharge Summary (Addendum)
Physician Discharge Summary  Valerie Young [id number] DOB: [birth year] DOA: 11/22/2013  PCP: Angelica Chessman, MD  Admit date: 11/22/2013 Discharge date: 11/24/2013  Recommendations for Outpatient Follow-up:  1. Patient is on Lovenox 55 mg every 24 hours for bridging with coumadin for pulmonary embolism. This was demonstrated on W/Q scan (CT not done due to renal insufficiency). 2. Continue coumadin 2 mg at bedtime for now but adjust the dose based on INR. INR should be 2-3 so once in the range you may stop Lovenox injections. 3. Continue Levaquin for 5 more days on discharge for pneumonia 4. Continue prednisone, 40 mg a day but taper down to 0 mg and taper down by 5 mg a day until 0 mg, then stop. 5. Hold Benicar due to renal insufficiency.   Discharge Diagnoses:  Principal Problem:   Pulmonary emboli Active Problems:   Acute respiratory failure with hypoxia   HYPOTHYROIDISM   HYPERTENSION   Adenocarcinoma of lung   Chronic kidney disease (CKD), stage IV (severe)   COPD exacerbation   Leukocytosis, unspecified   Anemia of chronic disease   CAP (community acquired pneumonia)   DVT (deep venous thrombosis)    Discharge Condition: medically stable for discharge to [facility] 11/25/2013  Diet recommendation: as tolerated   History of present illness:  79 y.o. female with past medical history of lung adenocarcinoma status post resection and recurrence, CKD, COPD, hypothyroidism who presented to [facility] ED 11/22/2013 with worsening shortness of breath and cough for past few days prior to this admission. Pt also reported subjective fevers, fatigue, myalgia. Pt was found to have negative flu test but is being treated for possible community acquired pneumonia and COPD exacerbation. She was additionally found to have high probability pulmonary embolism and is on anticoagulation with Lovenox and coumadin until INR gets to 2-3 range.  Assessment/Plan:   Principal Problem:  Acute respiratory  failure with hypoxia  - likely COPD and pulmonary embolism, pneumonia   - severely hypoxic on the admission, 76% on 2 L nasal canula  - remains stable respiratory wise  - continue treatment with nebulizer treatments, antibiotics and steroids (prednisone PO) and please note above the recommendations on discharge   Active Problems:  Pulmonary embolism - Lovenox and coumadin as recommendeda bove CAP (community acquired pneumonia)  - continue azithromycin and rocephin in hospital but Levaquin on discharge  - blood cultures not obtained at the time of the admission  - strep pneumo and flu test negative  - legionella negative  COPD exacerbation  - treatment as above, nebulizers, antibiotics, prednisone  HYPERTENSION  - continue atenolol - hold benicar due to renal insufficiency Chronic kidney disease (CKD), stage IV (severe)  - creatinine 1.24 even in 2009  - continue to monitor, on the admission creatinine was 1.56 and then 1.48  Leukocytosis, unspecified  - due to CAP  - continue azithromycin and rocephin in hospital - Levaquin for 5 days on discharge  Anemia of chronic disease  - secondary to history of lung ca  - hemoglobin 11.5 on the admission, no indications for transfusion  HYPOTHYROIDISM  - continue synthroid   Code Status: full code  Family Communication: no family at the bedside  Disposition Plan: to SNF 11/25/13   Consultants:  None  Procedures:  None  Antibiotics:  Azithromycin 11/22/2013 --> 11/25/2013 Rocephin 11/22/2013 --> 11/25/2013 Levaquin for 5 days on discharge  Oseltamivir 11/22/2013 --> 11/22/2013    Signed:  [name], MD  [facility] 11/24/2013, 2:42 PM  Pager #: [phone]   Discharge Exam: Filed Vitals:   11/24/13 1402  BP: 107/55  Pulse: 83  Temp: 97.2 F (36.2 C)  Resp: 16   Filed Vitals:   11/24/13 0649 11/24/13 0754 11/24/13 1216 11/24/13 1402  BP: 103/54   107/55  Pulse: 72   83  Temp: 97.9 F (36.6 C)   97.2 F  (36.2 C)  TempSrc: Oral   Oral  Resp: 20   16  Height:      Weight:      SpO2: 98% 97% 96% 100%    General: Pt is alert, follows commands appropriately, not in acute distress Cardiovascular: Regular rate and rhythm, S1/S2 +, no murmurs, no rubs, no gallops Respiratory: Clear to auscultation bilaterally, no wheezing, no crackles, no rhonchi Abdominal: Soft, non tender, non distended, bowel sounds +, no guarding Extremities: +1 LE pitting  edema, no cyanosis, pulses palpable bilaterally DP and PT Neuro: Grossly nonfocal  Discharge Instructions  Discharge Orders   Future Appointments Provider Department Dept Phone   12/16/2013 2:45 PM Angelica Chessman, MD [place] [phone]   02/16/2014 10:00 AM [facility] [phone]   Liquids only 4 hours prior to your exam. Any medications can be taken as usual. Please arrive 15 min prior to your scheduled exam time.   02/17/2014 11:00 AM [name], MD [place] [phone]   03/11/2014 1:45 PM Kathee Delton, MD [facility] [phone]   Future Orders Complete By Expires   Call MD for:  difficulty breathing, headache or visual disturbances  As directed    Call MD for:  persistant dizziness or light-headedness  As directed    Call MD for:  persistant nausea and vomiting  As directed    Call MD for:  severe uncontrolled pain  As directed    Diet - low sodium heart healthy  As directed    Discharge instructions  As directed    Comments:     1. Patient is on Lovenox 55 mg every 24 hours for bridging with coumadin for pulmonary embolism. This was demonstrated on W/Q scan (CT not done due to renal insufficiency). 2. Continue coumadin 4 mg at bedtime for now but adjust the dose based on INR. INR should be 2-3 so once in the range you may stop Lovenox injections. 3. Continue Levaquin for 5 more days on discharge for  pneumonia 4. Continue prednisone, 40 mg a day but taper down to 0 mg and taper down by 5 mg a day until 0 mg, then stop. 5. Hold Benicar due to renal insufficiency.   Increase activity slowly  As directed        Medication List    STOP taking these medications       olmesartan 20 MG tablet  Commonly known as:  BENICAR      TAKE these medications       acetaminophen-codeine 300-30 MG per tablet  Commonly known as:  TYLENOL #3  Take 1 tablet by mouth every 4 (four) hours as needed for moderate pain.     albuterol 108 (90 BASE) MCG/ACT inhaler  Commonly known as:  PROVENTIL HFA;VENTOLIN HFA  Inhale 2 puffs into the lungs every 6 (six) hours as needed for wheezing or shortness of breath.     atenolol 50 MG tablet  Commonly known as:  TENORMIN  Take 1 tablet by mouth daily.  cetirizine 10 MG tablet  Commonly known as:  ZYRTEC ALLERGY  Take 1 tablet (10 mg total) by mouth at bedtime.     cholecalciferol 1000 UNITS tablet  Commonly known as:  VITAMIN D  Take 1,000 Units by mouth daily.     enoxaparin 60 MG/0.6ML injection  Commonly known as:  LOVENOX  Inject 0.55 mLs (55 mg total) into the skin daily.     feeding supplement (ENSURE COMPLETE) Liqd  Take 237 mLs by mouth 2 (two) times daily between meals.     levothyroxine 50 MCG tablet  Commonly known as:  SYNTHROID, LEVOTHROID  Take 1 tablet by mouth daily.     predniSONE 5 MG tablet  Commonly known as:  DELTASONE  Take 8 tablets (40 mg total) by mouth daily with breakfast.     warfarin 4 MG tablet  Commonly known as:  COUMADIN  Take 1 tablet (4 mg total) by mouth daily.            Follow-up Information   Follow up with Angelica Chessman, MD. Schedule an appointment as soon as possible for a visit in 2 weeks.   Specialty:  Internal Medicine   Contact information:   [address] [place] [address] [phone]        The results of significant diagnostics from this hospitalization (including  imaging, microbiology, ancillary and laboratory) are listed below for reference.    Significant Diagnostic Studies: Dg Chest 2 View  11/22/2013   CLINICAL DATA:  Shortness of breath. Prior adenocarcinoma of the left upper lobe for which the patient underwent radiation therapy.  EXAM: CHEST  2 VIEW  COMPARISON:  Two-view chest x-ray 11/05/2013. CT chest 08/11/2013, 04/22/2013. PET-CT 12/21/2012.  FINDINGS: Post radiation fibrosis and bronchiectasis involving the left upper lobe, unchanged. Surgical clips and surgical suture material medially and laterally in the right upper lobe, arm presumably from prior bullectomy. Emphysematous changes throughout both lungs with hyperinflation. No new pulmonary parenchymal abnormalities. No pleural effusions.  Cardiac silhouette upper normal in size, unchanged. Thoracic aorta atherosclerotic, unchanged. Hilar and mediastinal contours otherwise unremarkable. Degenerative changes involving the thoracic spine.  IMPRESSION: No acute cardiopulmonary disease. Post radiation fibrosis and bronchiectasis involving the left upper lobe, unchanged. COPD/emphysema.   Electronically Signed   By: Evangeline Dakin M.D.   On: 11/22/2013 02:00   Dg Chest 2 View  11/05/2013   CLINICAL DATA:  Cough, lung cancer.  EXAM: CHEST  2 VIEW  COMPARISON:  Chest radiograph of December 03, 2012; CT scan of August 11, 2013.  FINDINGS: Stable cardiomediastinal silhouette. Right peritracheal surgical sutures are again noted. No pneumothorax is noted. No pleural effusion is noted. There is significant left apical density which corresponds to radiation fibrosis described on prior CT scan. No significant abnormality is noted in the right lung.  IMPRESSION: Left apical density is noted which corresponds to radiation fibrosis seen on prior CT scan. Postoperative changes are noted. No definite acute abnormality is noted.   Electronically Signed   By: Sabino Dick M.D.   On: 11/05/2013 15:07   Nm Pulmonary  Perf And Vent  11/22/2013   CLINICAL DATA:  History of lung cancer, post radiation fibrosis, COPD, now with hypoxia, evaluate for pulmonary embolism  EXAM: NUCLEAR MEDICINE VENTILATION - PERFUSION LUNG SCAN  TECHNIQUE: Ventilation images were obtained in multiple projections using inhaled aerosol technetium 99 M DTPA. Perfusion images were obtained in multiple projections after intravenous injection of Tc-77m MAA.  COMPARISON:  Chest radiograph -  11/22/2013; 12/03/2012; chest CT - 08/11/2013  RADIOPHARMACEUTICALS:  35 mCi Tc-47m DTPA aerosol and 5 mCi Tc-7m MAA  FINDINGS: Review of chest radiograph performed earlier same day demonstrates grossly unchanged cardiac silhouette and mediastinal contours with atherosclerotic plaque within the thoracic aorta. Stable postsurgical change of the right hilum. There has been worsening volume loss and associated consolidation involving primarily the left upper lobe. The lungs remain hyperexpanded. There is mild eventration of the medial aspect of the right hemidiaphragm. No pleural effusion or pneumothorax. No evidence of edema  Ventilation: There is marked clumping of inhaled radiotracer about the pulmonary hila. There is relative decreased ventilation of the left upper lobe with preferential ventilation of the right mid and lower lung. A large amount of ingested radiotracer seen within in the mouth, hypopharynx and superior aspect of the esophagus.  Perfusion: There are relatively large apparent mismatched perfusion defects involving the posterior, lateral and anterior basilar segments of the right lower lobe worrisome for pulmonary embolism.  IMPRESSION: Large mismatched perfusion deficit involving the right lower lobe worrisome for pulmonary embolism, though admittedly, the examination is difficult to interpret secondary to marked retention of inhaled radiotracer about the pulmonary hila and extensive postsurgical and radiation change of the lungs. Further evaluation  could be performed with contrast-enhanced chest CT and/or bilateral lower extremity venous Doppler ultrasound as clinically indicated.  Critical Value/emergent results were called by telephone at the time of interpretation on 11/22/2013 at 10:20 PM to [place], RN, who verbally acknowledged these results.   Electronically Signed   By: Sandi Mariscal M.D.   On: 11/22/2013 22:22    Microbiology: No results found for this or any previous visit (from the past 240 hour(s)).   Labs: Basic Metabolic Panel:  Recent Labs Lab 11/22/13 0124 11/23/13 0155  NA 136 140  K 5.1 5.2  CL 100 105  CO2 21 22  GLUCOSE 233* 171*  BUN 21 26*  CREATININE 1.56* 1.48*  CALCIUM 9.7 9.4   Liver Function Tests: No results found for this basename: AST, ALT, ALKPHOS, BILITOT, PROT, ALBUMIN,  in the last 168 hours No results found for this basename: LIPASE, AMYLASE,  in the last 168 hours No results found for this basename: AMMONIA,  in the last 168 hours CBC:  Recent Labs Lab 11/22/13 0124 11/23/13 0155 11/24/13 0415  WBC 13.8* 18.1* 12.7*  NEUTROABS 11.9*  --   --   HGB 11.5* 10.5* 9.3*  HCT 34.4* 31.8* 29.6*  MCV 96.9 97.0 100.3*  PLT 219 193 199   Cardiac Enzymes: No results found for this basename: CKTOTAL, CKMB, CKMBINDEX, TROPONINI,  in the last 168 hours BNP: BNP (last 3 results) No results found for this basename: PROBNP,  in the last 8760 hours CBG:  Recent Labs Lab 11/22/13 0741 11/22/13 1129 11/22/13 1642 11/22/13 2103  GLUCAP 307* 203* 180* 153*    Time coordinating discharge: Over 30 minutes

## 2013-11-24 NOTE — Progress Notes (Signed)
ANTICOAGULATION CONSULT NOTE - Initial Consult  Pharmacy Consult for warfarin/lovenox Indication: pulmonary embolus and DVT  Allergies  Allergen Reactions  . Codeine   . Sulfamethoxazole-Trimethoprim     REACTION: rash    Patient Measurements: Height: 5\' 4"  (162.6 cm) Weight: 121 lb 4.1 oz (55 kg) IBW/kg (Calculated) : 54.7  Vital Signs: Temp: 97.9 F (36.6 C) (12/31 0649) Temp src: Oral (12/31 0649) BP: 103/54 mmHg (12/31 0649) Pulse Rate: 72 (12/31 0649)  Labs:  Recent Labs  11/22/13 0124 11/22/13 1823 11/23/13 0155 11/24/13 0415  HGB 11.5*  --  10.5* 9.3*  HCT 34.4*  --  31.8* 29.6*  PLT 219  --  193 199  APTT  --  32  --   --   LABPROT  --  14.3  --  13.7  INR  --  1.13  --  1.07  HEPARINUNFRC  --   --  0.54  --   CREATININE 1.56*  --  1.48*  --     Estimated Creatinine Clearance: 25.3 ml/min (by C-G formula based on Cr of 1.48).   Medical History: Past Medical History  Diagnosis Date  . Hypertension   . Hypothyroidism   . COPD (chronic obstructive pulmonary disease)   . DJD (degenerative joint disease)     knees  . PND (post-nasal drip)     Chronic  . H/O: lung cancer 1982    Right; treated with resection  . Cancer     right lung ca - 30 years ago / left lung - 11/2012    Medications:  Scheduled:  . atenolol  50 mg Oral Daily  . azithromycin  500 mg Oral QHS  . cefTRIAXone (ROCEPHIN)  IV  1 g Intravenous Q24H  . cholecalciferol  1,000 Units Oral Daily  . enoxaparin (LOVENOX) injection  1 mg/kg Subcutaneous Q24H  . feeding supplement (ENSURE COMPLETE)  237 mL Oral BID BM  . ipratropium-albuterol  3 mL Nebulization QID  . levothyroxine  50 mcg Oral QAC breakfast  . loratadine  10 mg Oral Daily  . predniSONE  40 mg Oral Q breakfast  . warfarin   Does not apply Once  . Warfarin - Pharmacist Dosing Inpatient   Does not apply q1800   Infusions:    Assessment:  79 yo F on day #2/5 minimum Lovenox/warfarin overlap for PE/DVT.   CKD, Stage  IV with Scr 1.5 and estimated CrCl 25 ml/min   H/H 9.3/29.6, Plt stable 199, will monitor CBC  INR today remains at baseline 1.07, expected during initiation of warfarin.     Goal of Therapy:  INR 2-3  Monitor platelets by anticoagulation protocol: Yes   Plan:   Warfarin 4 mg x 1 tonight at 1800   Complete warfarin education today   Daily PT/INR  Lovenox 1 mg/kg Q24 hours = 55 mg daily  - adjusted for CrCl < 30 ml/min   CBC q72h    BorgerdingGaye Alken PharmD Pager #: [phone] 11:31 AM 11/24/2013

## 2013-11-24 NOTE — Progress Notes (Addendum)
Pt is planned to discharge to [facility] and [facility] on 11/25/2013.  CSW faxed preliminary discharge paperwork to [facility] today and confirmed facility received information.  The social work department is closed on 11/25/2012 and Unit RN will need to facilitate pt discharge needs by calling supervisor at [facility] at [phone] to make sure facility has everything that they need and facilitate patients discharge to [facility].  Discharge packet is in [place] and has all the needed documents.  Ambulance Transport should be set up by calling [phone] press 1 for English then press then 3 for non-emergency transport. (all documents that ambulance will need are in discharge packet).  If you have any issues, please contact the social worker on call and the on call social worker pager number is [phone] and can also be found in [place].   Appreciate unit RN assistance with facilitating pt discharge needs on 11/25/2013.  Drake Leach, MSW, [place] Social Work [phone]

## 2013-11-25 DIAGNOSIS — R5381 Other malaise: Secondary | ICD-10-CM | POA: Diagnosis not present

## 2013-11-25 DIAGNOSIS — J9600 Acute respiratory failure, unspecified whether with hypoxia or hypercapnia: Secondary | ICD-10-CM | POA: Diagnosis not present

## 2013-11-25 DIAGNOSIS — I10 Essential (primary) hypertension: Secondary | ICD-10-CM | POA: Diagnosis not present

## 2013-11-25 DIAGNOSIS — Z7901 Long term (current) use of anticoagulants: Secondary | ICD-10-CM | POA: Diagnosis not present

## 2013-11-25 DIAGNOSIS — J189 Pneumonia, unspecified organism: Secondary | ICD-10-CM | POA: Diagnosis not present

## 2013-11-25 DIAGNOSIS — D638 Anemia in other chronic diseases classified elsewhere: Secondary | ICD-10-CM | POA: Diagnosis not present

## 2013-11-25 DIAGNOSIS — Z85118 Personal history of other malignant neoplasm of bronchus and lung: Secondary | ICD-10-CM | POA: Diagnosis not present

## 2013-11-25 DIAGNOSIS — M1710 Unilateral primary osteoarthritis, unspecified knee: Secondary | ICD-10-CM | POA: Diagnosis not present

## 2013-11-25 DIAGNOSIS — J441 Chronic obstructive pulmonary disease with (acute) exacerbation: Secondary | ICD-10-CM | POA: Diagnosis not present

## 2013-11-25 DIAGNOSIS — J449 Chronic obstructive pulmonary disease, unspecified: Secondary | ICD-10-CM | POA: Diagnosis not present

## 2013-11-25 DIAGNOSIS — I82409 Acute embolism and thrombosis of unspecified deep veins of unspecified lower extremity: Secondary | ICD-10-CM | POA: Diagnosis not present

## 2013-11-25 DIAGNOSIS — C3490 Malignant neoplasm of unspecified part of unspecified bronchus or lung: Secondary | ICD-10-CM | POA: Diagnosis not present

## 2013-11-25 DIAGNOSIS — I2699 Other pulmonary embolism without acute cor pulmonale: Secondary | ICD-10-CM | POA: Diagnosis not present

## 2013-11-25 DIAGNOSIS — Z5189 Encounter for other specified aftercare: Secondary | ICD-10-CM | POA: Diagnosis not present

## 2013-11-25 LAB — CBC
HCT: 29.1 % — ABNORMAL LOW (ref 36.0–46.0)
Hemoglobin: 9.3 g/dL — ABNORMAL LOW (ref 12.0–15.0)
MCH: 32 pg (ref 26.0–34.0)
MCHC: 32 g/dL (ref 30.0–36.0)
MCV: 100 fL (ref 78.0–100.0)
Platelets: 206 10*3/uL (ref 150–400)
RBC: 2.91 MIL/uL — ABNORMAL LOW (ref 3.87–5.11)
RDW: 14.1 % (ref 11.5–15.5)
WBC: 12.2 10*3/uL — ABNORMAL HIGH (ref 4.0–10.5)

## 2013-11-25 LAB — PROTIME-INR
INR: 1.03 (ref 0.00–1.49)
Prothrombin Time: 13.3 seconds (ref 11.6–15.2)

## 2013-11-25 MED ORDER — WARFARIN SODIUM 4 MG PO TABS
4.0000 mg | ORAL_TABLET | ORAL | Status: AC
  Administered 2013-11-25: 4 mg via ORAL
  Filled 2013-11-25: qty 1

## 2013-11-25 MED ORDER — WARFARIN SODIUM 4 MG PO TABS: 4.0000 mg | ORAL_TABLET | Freq: Every day | ORAL | Status: DC

## 2013-11-25 MED ORDER — ALUM & MAG HYDROXIDE-SIMETH 200-200-20 MG/5ML PO SUSP
15.0000 mL | Freq: Four times a day (QID) | ORAL | Status: DC | PRN
  Administered 2013-11-25: 15 mL via ORAL
  Filled 2013-11-25: qty 30

## 2013-11-25 NOTE — Progress Notes (Signed)
Pt discharged to [facility] in stable condition. Report called to facility.

## 2013-11-25 NOTE — Progress Notes (Addendum)
[facility] PROGRESS NOTE  Valerie Young YWV:[id number] DOB: Sep 10, 1929 DOA: 11/22/2013 PCP: Valerie Chessman, MD  Brief narrative: Please refer to discharge summary done 11/24/2013. No changes in management since 11/24/2013. Pt is stable for discharge to SNF today.   80 y.o. female with past medical history of lung adenocarcinoma status post resection and recurrence, CKD, COPD, hypothyroidism who presented to [facility] ED 11/22/2013 with worsening shortness of breath and cough for past few days prior to this admission. Pt also reported subjective fevers, fatigue, myalgia. Pt was found to have negative flu test but is being treated for possible community acquired pneumonia and COPD exacerbation. She was additionally found to have high probability pulmonary embolism and is on anticoagulation with Lovenox and coumadin until INR gets to 2-3 range.    Assessment/Plan:   Principal Problem:  Acute respiratory failure with hypoxia  - likely COPD and pulmonary embolism, pneumonia  - severely hypoxic on the admission, 76% on 2 L nasal canula  - remains stable respiratory wise  - continue treatment with nebulizer treatments, antibiotics and steroids (prednisone PO) as prescribed Active Problems:  Pulmonary embolism - on coumadin and lovenox per discharge summary CAP (community acquired pneumonia)  - continue azithromycin and rocephin in hospital but Levaquin on discharge  - blood cultures not obtained at the time of the admission  - strep pneumo and flu test negative  - legionella negative  COPD exacerbation  - treatment as above, nebulizers, antibiotics, prednisone  HYPERTENSION  - continue atenolol  - hold benicar due to renal insufficiency  Chronic kidney disease (CKD), stage IV (severe)  - creatinine 1.24 even in 2009  - continue to monitor, on the admission creatinine was 1.56 and then 1.48  Leukocytosis, unspecified  - due to CAP  - continue azithromycin and rocephin in hospital   - Levaquin for 5 days on discharge  Anemia of chronic disease  - secondary to history of lung ca  - hemoglobin 11.5 on the admission, no indications for transfusion  HYPOTHYROIDISM  - continue synthroid    Code Status: full code  Family Communication: no family at the bedside  Disposition Plan: to SNF 11/25/13   Consultants:  None  Procedures:  None  Antibiotics:  Azithromycin 11/22/2013 --> 11/25/2013  Rocephin 11/22/2013 --> 11/25/2013  Levaquin for 5 days on discharge  Oseltamivir 11/22/2013 --> 11/22/2013   [name], MD  [facility] Pager [phone]  If 7PM-7AM, please contact night-coverage [email or web address] Password [email or web address] 11/25/2013, 10:19 AM   LOS: 3 days    HPI/Subjective: No overnight events.   Objective: Filed Vitals:   11/24/13 2000 11/24/13 2110 11/25/13 0548 11/25/13 1005  BP:  121/56 129/62   Pulse:  80 80   Temp:  98.1 F (36.7 C) 97.6 F (36.4 C)   TempSrc:  Oral Oral   Resp:  20 20   Height:      Weight:      SpO2: 97% 97% 97% 94%    Intake/Output Summary (Last 24 hours) at 11/25/13 1019 Last data filed at 11/25/13 0455  Gross per 24 hour  Intake    480 ml  Output    450 ml  Net     30 ml    Exam:   General:  Pt is alert, follows commands appropriately, not in acute distress  Cardiovascular: Regular rate and rhythm, S1/S2, no murmurs, no rubs, no gallops  Respiratory: Clear to auscultation bilaterally, no wheezing, no crackles, no rhonchi  Abdomen: Soft, non tender, non  distended, bowel sounds present, no guarding  Extremities: No edema, pulses DP and PT palpable bilaterally  Neuro: Grossly nonfocal  Data Reviewed: Basic Metabolic Panel:  Recent Labs Lab 11/22/13 0124 11/23/13 0155  NA 136 140  K 5.1 5.2  CL 100 105  CO2 21 22  GLUCOSE 233* 171*  BUN 21 26*  CREATININE 1.56* 1.48*  CALCIUM 9.7 9.4   Liver Function Tests: No results found for this basename: AST, ALT, ALKPHOS, BILITOT, PROT, ALBUMIN,  in the  last 168 hours No results found for this basename: LIPASE, AMYLASE,  in the last 168 hours No results found for this basename: AMMONIA,  in the last 168 hours CBC:  Recent Labs Lab 11/22/13 0124 11/23/13 0155 11/24/13 0415 11/25/13 0421  WBC 13.8* 18.1* 12.7* 12.2*  NEUTROABS 11.9*  --   --   --   HGB 11.5* 10.5* 9.3* 9.3*  HCT 34.4* 31.8* 29.6* 29.1*  MCV 96.9 97.0 100.3* 100.0  PLT 219 193 199 206   Cardiac Enzymes: No results found for this basename: CKTOTAL, CKMB, CKMBINDEX, TROPONINI,  in the last 168 hours BNP: No components found with this basename: POCBNP,  CBG:  Recent Labs Lab 11/22/13 0741 11/22/13 1129 11/22/13 1642 11/22/13 2103  GLUCAP 307* 203* 180* 153*    No results found for this or any previous visit (from the past 240 hour(s)).   Studies: No results found.  Scheduled Meds: . atenolol  50 mg Oral Daily  . azithromycin  500 mg Oral QHS  . cefTRIAXone (ROCEPHIN)  IV  1 g Intravenous Q24H  . cholecalciferol  1,000 Units Oral Daily  . enoxaparin (LOVENOX) injection  1 mg/kg Subcutaneous Q24H  . feeding supplement (ENSURE COMPLETE)  237 mL Oral BID BM  . ipratropium-albuterol  3 mL Nebulization QID  . levothyroxine  50 mcg Oral QAC breakfast  . loratadine  10 mg Oral Daily  . predniSONE  40 mg Oral Q breakfast  . warfarin   Does not apply Once  . Warfarin - Pharmacist Dosing Inpatient   Does not apply q1800   Continuous Infusions:

## 2013-11-25 NOTE — Progress Notes (Signed)
Report called to [facility] at [facility]. Coumadin dose administered prior to transfer.

## 2013-11-25 NOTE — Progress Notes (Signed)
ANTICOAGULATION CONSULT NOTE - Initial Consult  Pharmacy Consult for warfarin/lovenox Indication: pulmonary embolus and DVT  Allergies  Allergen Reactions  . Codeine   . Sulfamethoxazole-Trimethoprim     REACTION: rash    Patient Measurements: Height: 5\' 4"  (162.6 cm) Weight: 121 lb 4.1 oz (55 kg) IBW/kg (Calculated) : 54.7  Vital Signs: Temp: 97.6 F (36.4 C) (01/01 0548) Temp src: Oral (01/01 0548) BP: 129/62 mmHg (01/01 0548) Pulse Rate: 80 (01/01 0548)  Labs:  Recent Labs  11/22/13 1823  11/23/13 0155 11/24/13 0415 11/25/13 0421  HGB  --   < > 10.5* 9.3* 9.3*  HCT  --   --  31.8* 29.6* 29.1*  PLT  --   --  193 199 206  APTT 32  --   --   --   --   LABPROT 14.3  --   --  13.7 13.3  INR 1.13  --   --  1.07 1.03  HEPARINUNFRC  --   --  0.54  --   --   CREATININE  --   --  1.48*  --   --   < > = values in this interval not displayed.  Estimated Creatinine Clearance: 25.3 ml/min (by C-G formula based on Cr of 1.48).   Medical History: Past Medical History  Diagnosis Date  . Hypertension   . Hypothyroidism   . COPD (chronic obstructive pulmonary disease)   . DJD (degenerative joint disease)     knees  . PND (post-nasal drip)     Chronic  . H/O: lung cancer 1982    Right; treated with resection  . Cancer     right lung ca - 30 years ago / left lung - 11/2012    Medications:  Scheduled:  . atenolol  50 mg Oral Daily  . azithromycin  500 mg Oral QHS  . cefTRIAXone (ROCEPHIN)  IV  1 g Intravenous Q24H  . cholecalciferol  1,000 Units Oral Daily  . enoxaparin (LOVENOX) injection  1 mg/kg Subcutaneous Q24H  . feeding supplement (ENSURE COMPLETE)  237 mL Oral BID BM  . ipratropium-albuterol  3 mL Nebulization QID  . levothyroxine  50 mcg Oral QAC breakfast  . loratadine  10 mg Oral Daily  . predniSONE  40 mg Oral Q breakfast  . warfarin   Does not apply Once  . Warfarin - Pharmacist Dosing Inpatient   Does not apply q1800   Infusions:     Assessment:  80 yo F on day #2/5 minimum Lovenox/warfarin overlap for PE/DVT.   CKD, Stage IV with Scr 1.5 and estimated CrCl 25 ml/min   H/H 9.3/29.6, Plt stable 199, will monitor CBC  INR today remains at baseline today, warfarin dose not given last night, order not entered.  Will give 4 mg NOW.     Goal of Therapy:  INR 2-3  Monitor platelets by anticoagulation protocol: Yes   Plan:   Warfarin 4 mg Stat since missed dose last night   Warfarin educated has been completed    Daily PT/INR  Lovenox 1 mg/kg Q24 hours = 55 mg daily  - adjusted for CrCl < 30 ml/min   CBC q72h   **At discharge recommend 4 mg today (to be given here), and then discharge patient with 2 mg tablets.   Hard to say what her maintenance dose of warfarin will be, we start out initially dosing patients her age conservatively.  She will need lovenox 55 mg SQ Daily for at  least 5 days and until INR is > 2 for 24 hours.  I anticipate she will need at least 4-7 more doses of lovenox after discharge.     [place] Deleo, Gaye Alken PharmD Pager #: [phone] 10:40 AM 11/25/2013

## 2013-11-26 LAB — LEGIONELLA ANTIGEN, URINE: Legionella Antigen, Urine: NEGATIVE

## 2013-12-02 DIAGNOSIS — C3490 Malignant neoplasm of unspecified part of unspecified bronchus or lung: Secondary | ICD-10-CM | POA: Diagnosis not present

## 2013-12-02 DIAGNOSIS — I2699 Other pulmonary embolism without acute cor pulmonale: Secondary | ICD-10-CM | POA: Diagnosis not present

## 2013-12-08 ENCOUNTER — Telehealth: Payer: Self-pay | Admitting: Pulmonary Disease

## 2013-12-08 NOTE — Telephone Encounter (Signed)
lmomtcb x1 for april

## 2013-12-09 DIAGNOSIS — J449 Chronic obstructive pulmonary disease, unspecified: Secondary | ICD-10-CM | POA: Diagnosis not present

## 2013-12-09 DIAGNOSIS — Z7901 Long term (current) use of anticoagulants: Secondary | ICD-10-CM | POA: Diagnosis not present

## 2013-12-09 DIAGNOSIS — I2699 Other pulmonary embolism without acute cor pulmonale: Secondary | ICD-10-CM | POA: Diagnosis not present

## 2013-12-09 LAB — PROTIME-INR: INR: 2.8 — AB (ref 0.9–1.1)

## 2013-12-09 NOTE — Telephone Encounter (Signed)
I spoke with the pt daughter and she states the pt was in hospital for a PE and was discharged to rehab center. She states the pt is still having a lot of cough and she wants to know if we are going to f/u with her for the PE. Per CCM note we are to follow up with the pt so HFU set for 12-14-13 with TP. I spoke with [facility] as well and advised of appt. [place], CMA

## 2013-12-14 ENCOUNTER — Encounter: Payer: Self-pay | Admitting: Adult Health

## 2013-12-14 ENCOUNTER — Ambulatory Visit (INDEPENDENT_AMBULATORY_CARE_PROVIDER_SITE_OTHER): Payer: Medicare Other | Admitting: Adult Health

## 2013-12-14 ENCOUNTER — Ambulatory Visit (INDEPENDENT_AMBULATORY_CARE_PROVIDER_SITE_OTHER)
Admission: RE | Admit: 2013-12-14 | Discharge: 2013-12-14 | Disposition: A | Discharge status: Home / Self Care | Payer: Medicare Other | Source: Ambulatory Visit | Attending: Adult Health | Admitting: Adult Health

## 2013-12-14 VITALS — BP 144/80 | HR 92 | Temp 97.7°F | Ht 64.0 in | Wt 124.8 lb

## 2013-12-14 DIAGNOSIS — J189 Pneumonia, unspecified organism: Secondary | ICD-10-CM | POA: Diagnosis not present

## 2013-12-14 DIAGNOSIS — I2699 Other pulmonary embolism without acute cor pulmonale: Secondary | ICD-10-CM

## 2013-12-14 DIAGNOSIS — J441 Chronic obstructive pulmonary disease with (acute) exacerbation: Secondary | ICD-10-CM

## 2013-12-14 DIAGNOSIS — I82409 Acute embolism and thrombosis of unspecified deep veins of unspecified lower extremity: Secondary | ICD-10-CM | POA: Diagnosis not present

## 2013-12-14 MED ORDER — MOXIFLOXACIN HCL 400 MG PO TABS: 400.0000 mg | ORAL_TABLET | Freq: Every day | ORAL | Status: DC

## 2013-12-14 MED ORDER — PREDNISONE 10 MG PO TABS: ORAL_TABLET | ORAL | Status: DC

## 2013-12-14 MED ORDER — MOMETASONE FURO-FORMOTEROL FUM 200-5 MCG/ACT IN AERO: 2.0000 | INHALATION_SPRAY | Freq: Two times a day (BID) | RESPIRATORY_TRACT | Status: DC

## 2013-12-14 NOTE — Progress Notes (Signed)
   Subjective:    Patient ID: Valerie Young, female    DOB: June 16, 1929, 80 y.o.   MRN: [id number]  HPI 80 yo w/  known mild COPD, and is status post radiation therapy for lung cancer.  And  chronic cough   12/14/2013 Post Hospital follow up  Patient returns for a post hospital followup. Patient was admitted December 29 through December 31 for pulmonary emboli. She was admitted with increased shortness, of breath and hypoxia. Patient had a VQ scan due to renal insufficiency. That showed a high probability for pulmonary embolism with a large mismatched perfusion deficit involving the right lower lobe. Venous Doppler showed Right DVT  She was also treated for a community-acquired pneumonia and COPD, exacerbation. Of note CXR on 11/22/13 showed chronic LUL changes, no mention of acute infiltrate . She was treated with antibiotics, nebulized bronchodilators, and steroids. She was discharged on Levaquin and steroid taper. She was treated  with Lovenox, and Coumadin bridge, and transitioned to Coumadin. At discharge. Discharged to rehab.  She is getting her Coumadin checked at rehab  Her last INR was 2.78   She says prior to admission she had a bad fall onto right side/hip on 11/18/13.   Since discharge. She feels some better but has some wheezing, dry cough, chest congestion, chest tightness, PND, head congestion w/ bloody/clear drainage x2 weeks.  denies f/c/s, nausea, vomiting, congestion. Working PT. Plans for discharge home this week. No bleeding, chest pain , orthopnea or leg swelling.   Chest x-ray today shows a new RLL ASPDZ .     Review of Systems Constitutional:   No  weight loss, night sweats,  Fevers, chills, + fatigue, or  lassitude.  HEENT:   No headaches,  Difficulty swallowing,  Tooth/dental problems, or  Sore throat,                No sneezing, itching, ear ache,  +nasal congestion, post nasal drip,   CV:  No chest pain,  Orthopnea, PND, swelling in lower extremities,  anasarca, dizziness, palpitations, syncope.   GI  No heartburn, indigestion, abdominal pain, nausea, vomiting, diarrhea, change in bowel habits, loss of appetite, bloody stools.   Resp:     No chest wall deformity  Skin: no rash or lesions.  GU: no dysuria, change in color of urine, no urgency or frequency.  No flank pain, no hematuria   MS:  No joint pain or swelling.  No decreased range of motion.  No back pain.  Psych:  No change in mood or affect. No depression or anxiety.  No memory loss.         Objective:   Physical Exam GEN: A/Ox3; pleasant , NAD, frail elderly in WC   HEENT:  [place]/AT,  EACs-clear, TMs-wnl, NOSE-clear, THROAT-clear, no lesions, no postnasal drip or exudate noted.   NECK:  Supple w/ fair ROM; no JVD; normal carotid impulses w/o bruits; no thyromegaly or nodules palpated; no lymphadenopathy.  RESP  Few rhonchi , no wheezing .no accessory muscle use, no dullness to percussion  CARD:  RRR, no m/r/g  , no peripheral edema, pulses intact, no cyanosis or clubbing.  GI:   Soft & nt; nml bowel sounds; no organomegaly or masses detected.  Musco: Warm bil, no deformities or joint swelling noted.   Neuro: alert, no focal deficits noted.    Skin: Warm, no lesions or rashes         Assessment & Plan:

## 2013-12-14 NOTE — Patient Instructions (Addendum)
Avelox 400mg  daily for 7 days  Mucinex DM Twice daily  As needed  Cough/congestion  Prednisone taper over next week.  Begin Dulera 200/63mcg  2 puffs Twice daily  , rinse after use.  Follow up Dr. Gwenette Greet in 2 weeks with chest xray .  We are setting you up at the coumadin clinic at [facility].  Please contact office for sooner follow up if symptoms do not improve or worsen or seek emergency care

## 2013-12-15 NOTE — Assessment & Plan Note (Addendum)
Slow to resolve flare w/ associated CAP   Plan  Avelox 400mg  daily for 7 days  Mucinex DM Twice daily  As needed  Cough/congestion  Prednisone taper over next week.  Begin Dulera 200/70mcg  2 puffs Twice daily  , rinse after use.  Follow up Dr. Gwenette Greet in 2 weeks with chest xray .    Please contact office for sooner follow up if symptoms do not improve or worsen or seek emergency care

## 2013-12-15 NOTE — Assessment & Plan Note (Signed)
RLL infiltrate ? Infarct secondary to known PE vs PNA (?HC-PNA )   Plan  Avelox 400mg  daily for 7 days  Mucinex DM Twice daily  As needed  Cough/congestion  Follow up Dr. Gwenette Greet in 2 weeks with chest xray .   [name]  Please contact office for sooner follow up if symptoms do not improve or worsen or seek emergency care

## 2013-12-15 NOTE — Assessment & Plan Note (Signed)
Recent Dx PE w/ Right DVT  INR 2.78 therapeutic  ? Provoked PE w/ fall prior to admission   Plan  Set up w/ coumadin clinic

## 2013-12-16 ENCOUNTER — Ambulatory Visit: Payer: Medicare Other

## 2013-12-17 ENCOUNTER — Ambulatory Visit (INDEPENDENT_AMBULATORY_CARE_PROVIDER_SITE_OTHER): Payer: Medicare Other | Admitting: Pharmacist

## 2013-12-17 DIAGNOSIS — Z5181 Encounter for therapeutic drug level monitoring: Secondary | ICD-10-CM

## 2013-12-17 DIAGNOSIS — I120 Hypertensive chronic kidney disease with stage 5 chronic kidney disease or end stage renal disease: Secondary | ICD-10-CM | POA: Diagnosis not present

## 2013-12-17 DIAGNOSIS — J189 Pneumonia, unspecified organism: Secondary | ICD-10-CM | POA: Diagnosis not present

## 2013-12-17 DIAGNOSIS — I2699 Other pulmonary embolism without acute cor pulmonale: Secondary | ICD-10-CM

## 2013-12-17 DIAGNOSIS — N184 Chronic kidney disease, stage 4 (severe): Secondary | ICD-10-CM | POA: Diagnosis not present

## 2013-12-17 DIAGNOSIS — J441 Chronic obstructive pulmonary disease with (acute) exacerbation: Secondary | ICD-10-CM | POA: Diagnosis not present

## 2013-12-17 LAB — PROTIME-INR
INR: 7.3 ratio (ref 0.8–1.0)
Prothrombin Time: 73.8 s (ref 10.2–12.4)

## 2013-12-17 LAB — POCT INR: INR: 7.4

## 2013-12-20 ENCOUNTER — Telehealth: Payer: Self-pay | Admitting: Pulmonary Disease

## 2013-12-20 ENCOUNTER — Ambulatory Visit (INDEPENDENT_AMBULATORY_CARE_PROVIDER_SITE_OTHER): Payer: Medicare Other | Admitting: Pharmacist

## 2013-12-20 DIAGNOSIS — I2699 Other pulmonary embolism without acute cor pulmonale: Secondary | ICD-10-CM | POA: Diagnosis not present

## 2013-12-20 DIAGNOSIS — Z5181 Encounter for therapeutic drug level monitoring: Secondary | ICD-10-CM

## 2013-12-20 LAB — POCT INR: INR: 1.8

## 2013-12-20 MED ORDER — WARFARIN SODIUM 4 MG PO TABS: ORAL_TABLET | ORAL | Status: DC

## 2013-12-20 NOTE — Telephone Encounter (Signed)
New message  Patient needs RF on Warfarin [facility] [employer]

## 2013-12-20 NOTE — Telephone Encounter (Signed)
[facility]  

## 2013-12-24 ENCOUNTER — Ambulatory Visit (INDEPENDENT_AMBULATORY_CARE_PROVIDER_SITE_OTHER): Payer: Medicare Other | Admitting: Pharmacist

## 2013-12-24 DIAGNOSIS — Z5181 Encounter for therapeutic drug level monitoring: Secondary | ICD-10-CM

## 2013-12-24 DIAGNOSIS — I2699 Other pulmonary embolism without acute cor pulmonale: Secondary | ICD-10-CM | POA: Diagnosis not present

## 2013-12-24 LAB — POCT INR: INR: 2.7

## 2013-12-29 ENCOUNTER — Ambulatory Visit (INDEPENDENT_AMBULATORY_CARE_PROVIDER_SITE_OTHER): Payer: Medicare Other | Admitting: Pharmacist

## 2013-12-29 DIAGNOSIS — Z5181 Encounter for therapeutic drug level monitoring: Secondary | ICD-10-CM | POA: Diagnosis not present

## 2013-12-29 DIAGNOSIS — I2699 Other pulmonary embolism without acute cor pulmonale: Secondary | ICD-10-CM

## 2013-12-29 LAB — POCT INR: INR: 2.7

## 2013-12-30 ENCOUNTER — Ambulatory Visit (INDEPENDENT_AMBULATORY_CARE_PROVIDER_SITE_OTHER): Payer: Medicare Other | Admitting: Pulmonary Disease

## 2013-12-30 ENCOUNTER — Ambulatory Visit (INDEPENDENT_AMBULATORY_CARE_PROVIDER_SITE_OTHER)
Admission: RE | Admit: 2013-12-30 | Discharge: 2013-12-30 | Disposition: A | Discharge status: Home / Self Care | Payer: Medicare Other | Source: Ambulatory Visit | Attending: Pulmonary Disease | Admitting: Pulmonary Disease

## 2013-12-30 ENCOUNTER — Encounter: Payer: Self-pay | Admitting: Pulmonary Disease

## 2013-12-30 VITALS — BP 132/78 | HR 75 | Temp 97.4°F | Ht 64.0 in | Wt 125.0 lb

## 2013-12-30 DIAGNOSIS — J189 Pneumonia, unspecified organism: Secondary | ICD-10-CM

## 2013-12-30 DIAGNOSIS — J441 Chronic obstructive pulmonary disease with (acute) exacerbation: Secondary | ICD-10-CM

## 2013-12-30 DIAGNOSIS — C3410 Malignant neoplasm of upper lobe, unspecified bronchus or lung: Secondary | ICD-10-CM | POA: Diagnosis not present

## 2013-12-30 DIAGNOSIS — J479 Bronchiectasis, uncomplicated: Secondary | ICD-10-CM | POA: Diagnosis not present

## 2013-12-30 DIAGNOSIS — I2699 Other pulmonary embolism without acute cor pulmonale: Secondary | ICD-10-CM

## 2013-12-30 NOTE — Progress Notes (Signed)
   Subjective:    Patient ID: Valerie Young, female    DOB: [birth year], 80 y.o.   MRN: [id number]  HPI The patient comes in today for followup of her multiple pulmonary issues. She has a history of lung cancer, and we have treated her for possible radiation pneumonitis in the past with prednisone. She also has mild to moderate COPD, and for some reason has stopped taking her maintenance dulera. She feels that her breathing has not changed much since being off the medication. She was recently diagnosed with a DVT and PE last December, and is now on chronic anticoagulation. I suspect she will need to be on this lifetime given her underlying lung cancer history and poor mobility. Finally, she recently had an episode of right lower lobe pneumonia, but she has not totally cleared her x-ray. She had a film today which shows no significant change in her right lower lobe process. The patient states that her cough and mucus production is much improved. She has not had any hemoptysis or chest discomfort.   Review of Systems  Constitutional: Negative for fever and unexpected weight change.  HENT: Positive for postnasal drip and rhinorrhea. Negative for congestion, dental problem, ear pain, nosebleeds, sinus pressure, sneezing, sore throat and trouble swallowing.   Eyes: Negative for redness and itching.  Respiratory: Negative for cough, chest tightness, shortness of breath and wheezing.   Cardiovascular: Negative for palpitations and leg swelling.  Gastrointestinal: Negative for nausea and vomiting.  Genitourinary: Negative for dysuria.  Musculoskeletal: Negative for joint swelling.  Skin: Negative for rash.  Neurological: Negative for headaches.  Hematological: Does not bruise/bleed easily.  Psychiatric/Behavioral: Negative for dysphoric mood. The patient is not nervous/anxious.        Objective:   Physical Exam Frail-appearing female in no acute distress Nose without purulence or discharge  noted Oropharynx clear Neck without lymphadenopathy or thyromegaly Chest with rhonchi throughout, also abnormal bronchial breath sounds in the left upper lung zone related to her known scarring and radiation changes. No wheezing noted Cardiac exam with regular rate and rhythm Lower extremities with mild edema left greater than right, no cyanosis Alert and oriented, moves all 4 extremities.       Assessment & Plan:

## 2013-12-30 NOTE — Patient Instructions (Addendum)
Will start you on anoro one inhalation each am everyday.  Rinse mouth well.  If this works well for you, can send in a prescription.  Let us know. Stay on albuterol to use as needed for rescue Stay on your coumadin Keep your apptm for your followup ct chest next month. Get an apptm with your primary md to see if they want you on a blood pressure medication other than an ACE inhibitor.  followup with me again in 14mos, but call if you are having breathing issues.

## 2013-12-30 NOTE — Assessment & Plan Note (Signed)
The patient has a history of mild to moderate COPD, but currently is not on any type of maintenance bronchodilator. For some reason she discontinued her dulera, but she did not feel it was helping her that much. Will give her a trial of anoro, and she is to let us know if this helps so that we can call in a prescription.

## 2013-12-30 NOTE — Assessment & Plan Note (Signed)
The patient has had a recent episode of right lower lobe pneumonia, however her chest x-ray has not changed much. This may simply be a delay, and sometimes it takes as long as 8-12 weeks to clear a pneumonic process in a patient with underlying lung disease. This may also be left over scarring.  The patient is due for a followup CT chest next month with oncology, and we can use this to followup her right lower lobe process and make sure this is not representing some type of malignancy.

## 2013-12-31 ENCOUNTER — Telehealth: Payer: Self-pay | Admitting: Pharmacist

## 2013-12-31 NOTE — Telephone Encounter (Signed)
New message    On coumadin---eyes are "blood shot" red, they hurt and is sensitive to light.  Could this be from the coumadin?

## 2013-12-31 NOTE — Telephone Encounter (Signed)
Patient tells me that her eyes have been getting sore over the past week, and there is some "crust" forming around both her eyes.  She thinks it is pink eye, but wanted to know if this may be from Coumadin.  Her INRs have been therapeutic for the past two checks.  She tells me she doesn't have an eye doctor, but has a PCP.  If it is conjunctivitis she will likely get put on an antibiotic and she is instructed to call me if an antibiotic is started.    I told her that this doesn't sound like it is from coumadin and she is going to follow up with either her PCP or urgent care as soon as possible in case she needs to get on antibiotic.  She will call me if she is put on medication for this.

## 2014-01-01 DIAGNOSIS — A499 Bacterial infection, unspecified: Secondary | ICD-10-CM | POA: Diagnosis not present

## 2014-01-01 DIAGNOSIS — H1089 Other conjunctivitis: Secondary | ICD-10-CM | POA: Diagnosis not present

## 2014-01-01 DIAGNOSIS — H53149 Visual discomfort, unspecified: Secondary | ICD-10-CM | POA: Diagnosis not present

## 2014-01-01 DIAGNOSIS — B9689 Other specified bacterial agents as the cause of diseases classified elsewhere: Secondary | ICD-10-CM | POA: Diagnosis not present

## 2014-01-03 DIAGNOSIS — R030 Elevated blood-pressure reading, without diagnosis of hypertension: Secondary | ICD-10-CM | POA: Diagnosis not present

## 2014-01-03 DIAGNOSIS — H109 Unspecified conjunctivitis: Secondary | ICD-10-CM | POA: Diagnosis not present

## 2014-01-06 ENCOUNTER — Encounter: Payer: Self-pay | Admitting: Internal Medicine

## 2014-01-06 ENCOUNTER — Ambulatory Visit (INDEPENDENT_AMBULATORY_CARE_PROVIDER_SITE_OTHER): Payer: Medicare Other | Admitting: Pharmacist

## 2014-01-06 ENCOUNTER — Ambulatory Visit: Payer: Medicare Other | Attending: Internal Medicine | Admitting: Internal Medicine

## 2014-01-06 VITALS — BP 138/75 | HR 78 | Temp 98.4°F | Resp 16 | Ht 64.0 in | Wt 124.0 lb

## 2014-01-06 DIAGNOSIS — M25569 Pain in unspecified knee: Secondary | ICD-10-CM | POA: Diagnosis not present

## 2014-01-06 DIAGNOSIS — Z85118 Personal history of other malignant neoplasm of bronchus and lung: Secondary | ICD-10-CM | POA: Diagnosis not present

## 2014-01-06 DIAGNOSIS — Z5181 Encounter for therapeutic drug level monitoring: Secondary | ICD-10-CM | POA: Diagnosis not present

## 2014-01-06 DIAGNOSIS — J449 Chronic obstructive pulmonary disease, unspecified: Secondary | ICD-10-CM | POA: Diagnosis not present

## 2014-01-06 DIAGNOSIS — Z7901 Long term (current) use of anticoagulants: Secondary | ICD-10-CM | POA: Insufficient documentation

## 2014-01-06 DIAGNOSIS — M1710 Unilateral primary osteoarthritis, unspecified knee: Secondary | ICD-10-CM | POA: Diagnosis not present

## 2014-01-06 DIAGNOSIS — N184 Chronic kidney disease, stage 4 (severe): Secondary | ICD-10-CM | POA: Diagnosis not present

## 2014-01-06 DIAGNOSIS — I129 Hypertensive chronic kidney disease with stage 1 through stage 4 chronic kidney disease, or unspecified chronic kidney disease: Secondary | ICD-10-CM | POA: Insufficient documentation

## 2014-01-06 DIAGNOSIS — I2699 Other pulmonary embolism without acute cor pulmonale: Secondary | ICD-10-CM

## 2014-01-06 DIAGNOSIS — I10 Essential (primary) hypertension: Secondary | ICD-10-CM

## 2014-01-06 DIAGNOSIS — J4489 Other specified chronic obstructive pulmonary disease: Secondary | ICD-10-CM | POA: Insufficient documentation

## 2014-01-06 DIAGNOSIS — IMO0002 Reserved for concepts with insufficient information to code with codable children: Principal | ICD-10-CM

## 2014-01-06 LAB — POCT INR: INR: 1.7

## 2014-01-06 MED ORDER — ATENOLOL 50 MG PO TABS: 50.0000 mg | ORAL_TABLET | Freq: Every day | ORAL | Status: DC

## 2014-01-06 MED ORDER — AMLODIPINE BESYLATE 5 MG PO TABS: 5.0000 mg | ORAL_TABLET | Freq: Every day | ORAL | Status: DC

## 2014-01-06 MED ORDER — ACETAMINOPHEN-CODEINE #3 300-30 MG PO TABS: 1.0000 | ORAL_TABLET | ORAL | Status: DC | PRN

## 2014-01-06 NOTE — Patient Instructions (Signed)

## 2014-01-06 NOTE — Progress Notes (Signed)
Patient ID: Valerie Young, female   DOB: 01-Apr-1929, 80 y.o.   MRN: [id number]   Valerie Young, is a 80 y.o. female  YQM:[id number]  [id number]  DOB - 1929/06/20  Chief Complaint  Patient presents with  . Follow-up        Subjective:   Valerie Young is a 80 y.o. female here today for a follow up visit. She is a former smoker (35 pack year) with history of right upper lobectomy for cancer in May of 1981 in [place], without radiation or chemotherapy. She was treated for urinary infection in November of 2013. At that time, chest x-ray showed increase in a chronic left upper lobe density. CT images from November 2013 showed marked progression since 2005 of an infiltrate in the left upper lobe, starting peripherally and extending to the left hilum. Bronch on 1/14 revealed adenocarcinoma. PET CT on 1/27 showed malignant level activity in the left upper lung. 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 She also has history of COPD, hypertension, hypothyroidism and chronic kidney disease. She has been treated with prednisone for possible radiation pneumonitis. She has poor mobility. In December because of 14 she was diagnosed with DVT and PE. She is now on anticoagulation Coumadin, managed by the pulmonologist and Coumadin clinic. Patient is tolerating her medications without side effects. Her major concern today is ongoing arthritis pain of her knees, she is out of pain medications. She has moderate COPD but she denies shortness of breath today, she continues to cough sometimes productive, sometimes not. Patient has No headache, No chest pain, No abdominal pain - No Nausea, No new weakness tingling or numbness, No Cough - SOB.  Problem  Arthritis of Knee  Essential Hypertension, Benign    ALLERGIES: Allergies  Allergen Reactions  . Codeine   . Sulfamethoxazole-Trimethoprim     REACTION: rash      PAST MEDICAL HISTORY: Past Medical History  Diagnosis Date  . Hypertension   . Hypothyroidism   . COPD (chronic obstructive pulmonary disease)   . DJD (degenerative joint disease)     knees  . PND (post-nasal drip)     Chronic  . H/O: lung cancer 1982    Right; treated with resection  . Cancer     right lung ca - 30 years ago / left lung - 11/2012    MEDICATIONS AT HOME: Prior to Admission medications   Medication Sig Start Date End Date Taking? Authorizing Provider  albuterol (PROVENTIL HFA;VENTOLIN HFA) 108 (90 BASE) MCG/ACT inhaler Inhale 2 puffs into the lungs every 6 (six) hours as needed for wheezing or shortness of breath.   Yes Historical Provider, MD  amLODipine (NORVASC) 5 MG tablet Take 1 tablet (5 mg total) by mouth at bedtime. 01/06/14  Yes Angelica Chessman, MD  atenolol (TENORMIN) 50 MG tablet Take 1 tablet (50 mg total) by mouth daily. 01/06/14  Yes Angelica Chessman, MD  cetirizine (ZYRTEC) 10 MG tablet Take 10 mg by mouth daily.   Yes Historical Provider, MD  cholecalciferol (VITAMIN D) 1000 UNITS tablet Take 1,000 Units by mouth daily.   Yes Historical Provider, MD  feeding supplement, ENSURE COMPLETE, (ENSURE COMPLETE) LIQD Take 237 mLs by mouth 2 (two) times daily between meals. 11/24/13  Yes Robbie Lis, MD  guaiFENesin (MUCINEX) 600 MG 12 hr tablet Take 600 mg by mouth 2 (two) times daily as needed.   Yes Historical  Provider, MD  levothyroxine (SYNTHROID, LEVOTHROID) 50 MCG tablet Take 1 tablet by mouth daily. 09/30/12  Yes Historical Provider, MD  warfarin (COUMADIN) 4 MG tablet Take as directed by Coumadin Clinic 12/20/13  Yes Kathee Delton, MD  acetaminophen-codeine (TYLENOL #3) 300-30 MG per tablet Take 1 tablet by mouth every 4 (four) hours as needed. 01/06/14   Angelica Chessman, MD  traMADol (ULTRAM) 50 MG tablet Take 50 mg by mouth every 6 (six) hours as needed.    Historical Provider, MD     Objective:   Filed Vitals:   01/06/14 1545  BP: 138/75   Pulse: 78  Temp: 98.4 F (36.9 C)  TempSrc: Oral  Resp: 16  Height: 5\' 4"  (1.626 m)  Weight: 124 lb (56.246 kg)  SpO2: 97%    Exam General appearance : Awake, alert, not in any distress. Speech Clear. Not toxic looking HEENT: Atraumatic and Normocephalic, pupils equally reactive to light and accomodation Neck: supple, no JVD. No cervical lymphadenopathy.  Chest:Good air entry bilaterally, no added sounds  CVS: S1 S2 regular, no murmurs.  Abdomen: Bowel sounds present, Non tender and not distended with no gaurding, rigidity or rebound. Extremities: B/L Lower Ext shows no edema, both legs are warm to touch Neurology: Awake alert, and oriented X 3, CN II-XII intact, Non focal Skin:No Rash Wounds:N/A  Data Review  Assessment & Plan   1. Chronic kidney disease (CKD), stage IV (severe) Stable Ambulatory referral to nephrologist    2. Arthritis of knee  - acetaminophen-codeine (TYLENOL #3) 300-30 MG per tablet; Take 1 tablet by mouth every 4 (four) hours as needed.  Dispense: 60 tablet; Refill: 0  3. Essential hypertension, benign  - amLODipine (NORVASC) 5 MG tablet; Take 1 tablet (5 mg total) by mouth at bedtime.  Dispense: 90 tablet; Refill: 3 - atenolol (TENORMIN) 50 MG tablet; Take 1 tablet (50 mg total) by mouth daily.  Dispense: 90 tablet; Refill: 3  Patient was encouraged to continue Coumadin, she would probably have to be on anticoagulation for life with a background history of lung cancer. She was counseled extensively on nutrition and exercise  Return in about 3 months (around 04/05/2014), or if symptoms worsen or fail to improve, for Routine Follow Up.  The patient was given clear instructions to go to ER or return to medical center if symptoms don't improve, worsen or new problems develop. The patient verbalized understanding. The patient was told to call to get lab results if they haven't heard anything in the next week.    Angelica Chessman, MD, [place], [place],  [place] [facility] [place], [place]   01/06/2014, 4:45 PM

## 2014-01-06 NOTE — Progress Notes (Signed)
Pt is here following up on her chronic pain in her left leg. Pt recently found that she had a blood clots in her legs and lungs.

## 2014-01-14 ENCOUNTER — Ambulatory Visit (INDEPENDENT_AMBULATORY_CARE_PROVIDER_SITE_OTHER): Payer: Medicare Other | Admitting: *Deleted

## 2014-01-14 DIAGNOSIS — Z5181 Encounter for therapeutic drug level monitoring: Secondary | ICD-10-CM

## 2014-01-14 DIAGNOSIS — I2699 Other pulmonary embolism without acute cor pulmonale: Secondary | ICD-10-CM | POA: Diagnosis not present

## 2014-01-14 LAB — POCT INR: INR: 1.6

## 2014-01-19 ENCOUNTER — Telehealth: Payer: Self-pay | Admitting: Pulmonary Disease

## 2014-01-19 MED ORDER — UMECLIDINIUM-VILANTEROL 62.5-25 MCG/INH IN AEPB: INHALATION_SPRAY | RESPIRATORY_TRACT | Status: DC

## 2014-01-19 NOTE — Telephone Encounter (Signed)
Spoke with pt and advised that rx for Anoro was sent to [place]

## 2014-01-24 ENCOUNTER — Ambulatory Visit (INDEPENDENT_AMBULATORY_CARE_PROVIDER_SITE_OTHER): Payer: Medicare Other | Admitting: *Deleted

## 2014-01-24 DIAGNOSIS — I2699 Other pulmonary embolism without acute cor pulmonale: Secondary | ICD-10-CM

## 2014-01-24 DIAGNOSIS — Z5181 Encounter for therapeutic drug level monitoring: Secondary | ICD-10-CM

## 2014-01-24 LAB — POCT INR: INR: 2.3

## 2014-01-26 ENCOUNTER — Telehealth: Payer: Self-pay | Admitting: Pulmonary Disease

## 2014-01-26 NOTE — Telephone Encounter (Signed)
Why don't we just start her on spiriva respimat for now, 2 inhalations each am.  She probably needs to come by and get sample and we can show her how to use.

## 2014-01-26 NOTE — Telephone Encounter (Signed)
ATC PT NA not able to leave VM [facility]

## 2014-01-26 NOTE — Telephone Encounter (Signed)
Spoke with pt and she went to pick up rx for Anoro and it was $322.  She cant afford this.  Please advise on an alternative.

## 2014-01-27 NOTE — Telephone Encounter (Signed)
Pt is aware of [facility]'s recs. Samples will be left up front for pick up. Advised her that one [place] will show her how to use it.

## 2014-02-07 DIAGNOSIS — I129 Hypertensive chronic kidney disease with stage 1 through stage 4 chronic kidney disease, or unspecified chronic kidney disease: Secondary | ICD-10-CM | POA: Diagnosis not present

## 2014-02-07 DIAGNOSIS — N189 Chronic kidney disease, unspecified: Secondary | ICD-10-CM | POA: Diagnosis not present

## 2014-02-08 ENCOUNTER — Ambulatory Visit (INDEPENDENT_AMBULATORY_CARE_PROVIDER_SITE_OTHER): Payer: Medicare Other

## 2014-02-08 DIAGNOSIS — I2699 Other pulmonary embolism without acute cor pulmonale: Secondary | ICD-10-CM | POA: Diagnosis not present

## 2014-02-08 DIAGNOSIS — Z5181 Encounter for therapeutic drug level monitoring: Secondary | ICD-10-CM | POA: Diagnosis not present

## 2014-02-08 LAB — POCT INR: INR: 2.5

## 2014-02-16 ENCOUNTER — Encounter (HOSPITAL_COMMUNITY): Payer: Self-pay

## 2014-02-16 ENCOUNTER — Ambulatory Visit (HOSPITAL_COMMUNITY)
Admission: RE | Admit: 2014-02-16 | Discharge: 2014-02-16 | Disposition: A | Discharge status: Home / Self Care | Payer: Medicare Other | Source: Ambulatory Visit | Attending: Radiation Oncology | Admitting: Radiation Oncology

## 2014-02-16 DIAGNOSIS — R911 Solitary pulmonary nodule: Secondary | ICD-10-CM | POA: Insufficient documentation

## 2014-02-16 DIAGNOSIS — I2510 Atherosclerotic heart disease of native coronary artery without angina pectoris: Secondary | ICD-10-CM | POA: Diagnosis not present

## 2014-02-16 DIAGNOSIS — C3490 Malignant neoplasm of unspecified part of unspecified bronchus or lung: Secondary | ICD-10-CM | POA: Diagnosis not present

## 2014-02-16 DIAGNOSIS — Z902 Acquired absence of lung [part of]: Secondary | ICD-10-CM | POA: Diagnosis not present

## 2014-02-16 DIAGNOSIS — J438 Other emphysema: Secondary | ICD-10-CM | POA: Diagnosis not present

## 2014-02-16 DIAGNOSIS — C3492 Malignant neoplasm of unspecified part of left bronchus or lung: Secondary | ICD-10-CM

## 2014-02-16 DIAGNOSIS — IMO0002 Reserved for concepts with insufficient information to code with codable children: Secondary | ICD-10-CM | POA: Diagnosis not present

## 2014-02-16 DIAGNOSIS — I2584 Coronary atherosclerosis due to calcified coronary lesion: Secondary | ICD-10-CM | POA: Insufficient documentation

## 2014-02-16 DIAGNOSIS — Z923 Personal history of irradiation: Secondary | ICD-10-CM | POA: Insufficient documentation

## 2014-02-16 DIAGNOSIS — J90 Pleural effusion, not elsewhere classified: Secondary | ICD-10-CM | POA: Insufficient documentation

## 2014-02-16 DIAGNOSIS — J479 Bronchiectasis, uncomplicated: Secondary | ICD-10-CM | POA: Insufficient documentation

## 2014-02-16 DIAGNOSIS — C3410 Malignant neoplasm of upper lobe, unspecified bronchus or lung: Secondary | ICD-10-CM | POA: Diagnosis not present

## 2014-02-16 DIAGNOSIS — R0602 Shortness of breath: Secondary | ICD-10-CM | POA: Insufficient documentation

## 2014-02-17 ENCOUNTER — Ambulatory Visit
Admission: RE | Admit: 2014-02-17 | Discharge: 2014-02-17 | Disposition: A | Discharge status: Home / Self Care | Payer: Medicare Other | Source: Ambulatory Visit | Attending: Radiation Oncology | Admitting: Radiation Oncology

## 2014-02-17 ENCOUNTER — Telehealth: Payer: Self-pay | Admitting: *Deleted

## 2014-02-17 ENCOUNTER — Encounter: Payer: Self-pay | Admitting: Radiation Oncology

## 2014-02-17 VITALS — BP 144/57 | HR 65 | Resp 16 | Wt 131.0 lb

## 2014-02-17 DIAGNOSIS — C3490 Malignant neoplasm of unspecified part of unspecified bronchus or lung: Secondary | ICD-10-CM

## 2014-02-17 NOTE — Progress Notes (Signed)
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  Denies nausea, vomiting, headache, dizziness or diarrhea. Patient reports difficulty walking and standing due to arthritis in left knee. Reports low back pain related to degenerative disc continues. Denies difficulty swallowing or chest pain. Denies wheezing. Reports frequency of cough has decreased. Reports cough is dry and no longer productive. Reports shortness of breath with exertion and when she leans over                              ALLERGIES:  is allergic to codeine and sulfamethoxazole-trimethoprim.  Meds: Current Outpatient Prescriptions  Medication Sig Dispense Refill  . albuterol (PROVENTIL HFA;VENTOLIN HFA) 108 (90 BASE) MCG/ACT inhaler Inhale 2 puffs into the lungs every 6 (six) hours as needed for wheezing or shortness of breath.      [name] amLODipine (NORVASC) 5 MG tablet Take 1 tablet (5 mg total) by mouth at bedtime.  90 tablet  3  . atenolol (TENORMIN) 50 MG tablet Take 1 tablet (50 mg total) by mouth daily.  90 tablet  3  . cetirizine (ZYRTEC) 10 MG tablet Take 10 mg by mouth daily.      . cholecalciferol (VITAMIN D) 1000 UNITS tablet Take 1,000 Units by mouth daily.      [name] levothyroxine (SYNTHROID, LEVOTHROID) 50 MCG tablet Take 1 tablet by mouth daily.      . traMADol (ULTRAM) 50 MG tablet Take 50 mg by mouth every 6 (six) hours as needed.      [name] Umeclidinium-Vilanterol 62.5-25 MCG/INH AEPB One inhalation into lungs daily every am.  60 each  6  . warfarin (COUMADIN) 4 MG tablet Take as  directed by [facility]  30 tablet  2  . acetaminophen-codeine (TYLENOL #3) 300-30 MG per tablet Take 1 tablet by mouth every 4 (four) hours as needed.  60 tablet  0  . feeding supplement, ENSURE COMPLETE, (ENSURE COMPLETE) LIQD Take 237 mLs by mouth 2 (two) times daily between meals.  10 Bottle  0  . guaiFENesin (MUCINEX) 600 MG 12 hr tablet Take 600 mg by mouth 2 (two) times daily as needed.       No current facility-administered medications for this encounter.    Physical Findings: The patient is in no acute distress. Patient is alert and oriented.  weight is 131 lb (59.421 kg). Her blood pressure is 144/57 and her pulse is 65. Her respiration is 16 and oxygen saturation is 100%. .  No significant changes.  Lab Findings: Lab Results  Component Value Date   WBC 12.2* 11/25/2013   HGB 9.3* 11/25/2013   HCT 29.1* 11/25/2013   MCV 100.0 11/25/2013   PLT 206 11/25/2013    @LASTCHEM @  Radiographic Findings: Ct Chest Wo Contrast  02/16/2014   CLINICAL DATA:  Lung cancer, radiation therapy complete. Shortness of breath.  EXAM: CT CHEST WITHOUT CONTRAST  TECHNIQUE: Multidetector CT imaging of the chest was performed following the standard protocol without IV contrast.  COMPARISON:  CT CHEST W/O CM dated 08/11/2013; CT CHEST W/O CM dated 04/22/2013  FINDINGS: Mediastinal lymph nodes are not enlarged by CT size criteria. Hilar regions are difficult to definitively evaluate without IV contrast. No axillary adenopathy. Atherosclerotic calcification of the arterial vasculature, including three-vessel involvement of the coronary arteries. Heart size normal. No pericardial effusion.  Small left pleural effusion is new. Increasing collapse/ consolidation in the apical portion of the left upper lobe, with associated bronchiectasis and architectural distortion. A more confluent component is seen at the apex (image 10). Centrilobular emphysema. Mild basilar predominant subpleural reticulation and ground-glass,  slightly progressive from 08/11/2013. Postoperative changes of right upper lobectomy. Possible 6 mm (4 x 8 mm) nodule in the right suprahilar region (image 19), stable. 3 mm right lower lobe nodule (image 33), likely stable from 04/22/2013. 3 mm nodule along the inferior right major fissure (image 37) is not well seen on the prior exam. No pleural fluid. Airway is otherwise unremarkable.  Incidental imaging of the upper abdomen shows the visualized portions of the liver, adrenal glands, kidneys, spleen, pancreas, no upper abdominal adenopathy. No worrisome lytic or sclerotic lesions. Degenerative changes are seen in the spine. Thoracotomy changes on the right.  IMPRESSION: 1. Favor evolutionary changes of radiation therapy in the left upper lobe. Difficult to definitively exclude recurrent disease at the apex. Continued attention on followup exams is warranted. 2. Small left pleural effusion, new. 3. Mild basilar predominant subpleural reticulation and ground-glass appear somewhat progressive from 08/11/2013 and are indicative of mild fibrosis, possibly treatment-related. 4. 3 mm nodule along the inferior right major fissure is not well seen on the prior exam. Continued attention on followup exams is warranted. 5. Three-vessel coronary artery calcification.   Electronically Signed   By: Lorin Picket M.D.   On: 02/16/2014 13:45    Impression:  The patient is recovering from the effects of radiation.  No evidence of active lung cancer on chest CT  Plan:  CT in 6 months then follow-up.  _____________________________________  Sheral Apley. Tammi Klippel, M.D.

## 2014-02-17 NOTE — Progress Notes (Signed)
Denies nausea, vomiting, headache, dizziness or diarrhea. Patient reports difficulty walking and standing due to arthritis in left knee. Reports low back pain related to degenerative disc continues. Denies difficulty swallowing or chest pain. Denies wheezing. Reports frequency of cough has decreased. Reports cough is dry and no longer productive. Reports shortness of breath with exertion and when she leans over.

## 2014-02-17 NOTE — Telephone Encounter (Signed)
Called patient to inform of test and fu, lvm for a return call

## 2014-03-01 ENCOUNTER — Ambulatory Visit (INDEPENDENT_AMBULATORY_CARE_PROVIDER_SITE_OTHER): Payer: Medicare Other | Admitting: Pharmacist Clinician (PhC)/ Clinical Pharmacy Specialist

## 2014-03-01 DIAGNOSIS — Z5181 Encounter for therapeutic drug level monitoring: Secondary | ICD-10-CM

## 2014-03-01 DIAGNOSIS — I2699 Other pulmonary embolism without acute cor pulmonale: Secondary | ICD-10-CM

## 2014-03-01 LAB — POCT INR: INR: 2.5

## 2014-03-09 DIAGNOSIS — E039 Hypothyroidism, unspecified: Secondary | ICD-10-CM | POA: Diagnosis not present

## 2014-03-09 DIAGNOSIS — I10 Essential (primary) hypertension: Secondary | ICD-10-CM | POA: Diagnosis not present

## 2014-03-09 DIAGNOSIS — M25569 Pain in unspecified knee: Secondary | ICD-10-CM | POA: Diagnosis not present

## 2014-03-09 DIAGNOSIS — N183 Chronic kidney disease, stage 3 unspecified: Secondary | ICD-10-CM | POA: Diagnosis not present

## 2014-03-11 ENCOUNTER — Ambulatory Visit: Payer: Medicare Other | Admitting: Pulmonary Disease

## 2014-03-29 ENCOUNTER — Ambulatory Visit (INDEPENDENT_AMBULATORY_CARE_PROVIDER_SITE_OTHER): Payer: Medicare Other

## 2014-03-29 DIAGNOSIS — Z5181 Encounter for therapeutic drug level monitoring: Secondary | ICD-10-CM | POA: Diagnosis not present

## 2014-03-29 DIAGNOSIS — I2699 Other pulmonary embolism without acute cor pulmonale: Secondary | ICD-10-CM

## 2014-03-29 LAB — POCT INR: INR: 1.8

## 2014-03-31 DIAGNOSIS — M1710 Unilateral primary osteoarthritis, unspecified knee: Secondary | ICD-10-CM | POA: Diagnosis not present

## 2014-04-07 ENCOUNTER — Ambulatory Visit: Payer: Medicare Other | Admitting: Internal Medicine

## 2014-04-20 ENCOUNTER — Ambulatory Visit (INDEPENDENT_AMBULATORY_CARE_PROVIDER_SITE_OTHER): Payer: Medicare Other | Admitting: Pharmacist

## 2014-04-20 DIAGNOSIS — Z5181 Encounter for therapeutic drug level monitoring: Secondary | ICD-10-CM | POA: Diagnosis not present

## 2014-04-20 DIAGNOSIS — I2699 Other pulmonary embolism without acute cor pulmonale: Secondary | ICD-10-CM | POA: Diagnosis not present

## 2014-04-20 LAB — POCT INR: INR: 2.2

## 2014-04-25 ENCOUNTER — Ambulatory Visit: Payer: Medicare Other | Admitting: Pulmonary Disease

## 2014-04-28 ENCOUNTER — Telehealth: Payer: Self-pay | Admitting: Pulmonary Disease

## 2014-04-28 NOTE — Telephone Encounter (Signed)
I called and spoke w/ pt. Made it aware did not see where he wanted her to have CXR. Advised her if he did then he can send her when he see's her. nothing further needed

## 2014-04-28 NOTE — Telephone Encounter (Signed)
atc line busy wcb 

## 2014-04-29 ENCOUNTER — Encounter: Payer: Self-pay | Admitting: Pulmonary Disease

## 2014-04-29 ENCOUNTER — Telehealth: Payer: Self-pay | Admitting: *Deleted

## 2014-04-29 ENCOUNTER — Ambulatory Visit: Payer: Medicare Other | Admitting: Pulmonary Disease

## 2014-04-29 VITALS — BP 118/62 | HR 60 | Temp 97.5°F | Ht 64.0 in | Wt 131.2 lb

## 2014-04-29 DIAGNOSIS — J439 Emphysema, unspecified: Secondary | ICD-10-CM

## 2014-04-29 NOTE — Patient Instructions (Signed)
Will keep you off maintenance medications for now, and just use albuterol as needed.  If you find that your breathing is getting worse, or you or not able to do your daily activities, let me know Stay as active as possible. followup with me again in 55mos.

## 2014-04-29 NOTE — Assessment & Plan Note (Signed)
The patient saw definite benefit from the anoro and the Spiriva, but did not feel that she can afford them on a consistent basis. She is currently on albuterol as needed only, and feels that her quality of life and activities of daily living are adequate. I have explained to her that we treat with maintenance medications for COPD in order to prevent acute exacerbations, and also to improve symptoms. If these are not applying to her currently, I do not mind using as needed albuterol to see how she does over time.

## 2014-04-29 NOTE — Progress Notes (Signed)
   Subjective:    Patient ID: Valerie Young, female    DOB: 1929/10/05, 80 y.o.   MRN: [id number]  HPI Patient comes in today for followup of her known COPD. She has been tried on various bronchodilators with success, but feels that she cannot afford them on a consistent basis. She is currently on no maintenance medication, and using albuterol only as needed under rare circumstances. She tells me that she is satisfied with her activities of daily living and quality of life, and denies any significant cough or mucus production.   Review of Systems  Constitutional: Negative for fever and unexpected weight change.  HENT: Negative for congestion, dental problem, ear pain, nosebleeds, postnasal drip, rhinorrhea, sinus pressure, sneezing, sore throat and trouble swallowing.   Eyes: Negative for redness and itching.  Respiratory: Negative for cough, chest tightness, shortness of breath and wheezing.   Cardiovascular: Negative for palpitations and leg swelling.  Gastrointestinal: Negative for nausea and vomiting.  Genitourinary: Negative for dysuria.  Musculoskeletal: Negative for joint swelling.  Skin: Negative for rash.  Neurological: Negative for headaches.  Hematological: Does not bruise/bleed easily.  Psychiatric/Behavioral: Negative for dysphoric mood. The patient is not nervous/anxious.        Objective:   Physical Exam Thin female in no acute distress Nose without purulence or discharge noted Neck without lymphadenopathy or thyromegaly Chest with a few rhonchi, otherwise clear with adequate airflow Cardiac exam with regular rate and rhythm Lower extremities with mild edema and varicosities, no cyanosis Alert and oriented, moves all 4 extremities.       Assessment & Plan:

## 2014-04-29 NOTE — Telephone Encounter (Signed)
Per [place] he does not manager her coumadin. He is not sure why coumadin clinic refills her coumadin under his name. She needs to contact whomever started this for her  Called pt mobile # and NA and no VM Called home # and NA and no VM WCB

## 2014-05-03 ENCOUNTER — Telehealth: Payer: Self-pay | Admitting: Pulmonary Disease

## 2014-05-03 ENCOUNTER — Telehealth: Payer: Self-pay | Admitting: *Deleted

## 2014-05-03 NOTE — Telephone Encounter (Signed)
Pt was instructed to call you back with information from Dr Gwenette Greet. Please call back.

## 2014-05-03 NOTE — Telephone Encounter (Signed)
I called and made pt aware of the below. Nothing further needed

## 2014-05-03 NOTE — Telephone Encounter (Signed)
Called spoke with Janett Billow from coumadin clinic. She reports the coumadin was started by pulmonary back when pt was in the hospital. Pt has been on this x end of December. Pt is needing a refill now.   Per consult on pt back in hospital when PW saw pt on 11/23/14: Pulmonary embolism RLL. DVT RLE.  PLAN:  On heparin drip. Ok to change to lovenox SQ and bridge with coumadin. Needs 5days of hep/lovenox rx and 2day overlap therapeutic coumadin and lovenox  We can rx with coumadin per [place] [facility] at [facility] plus dr Gwenette Greet can follow for PE/DVT. ---  Please advise [place] thanks

## 2014-05-03 NOTE — Telephone Encounter (Signed)
I am willing to oversee her coumadin, but there needs to be better communication.  My name was associated with this, and I had no idea that was being done.

## 2014-05-03 NOTE — Telephone Encounter (Signed)
LMTCB for Valerie Young

## 2014-05-03 NOTE — Telephone Encounter (Signed)
I called spoke with Tiffany from coumadin clinic. She then transferred me to [place].  He reports pt was sent to them from pt hospitalization in December orders from pulmonary at that time for coumadin.  Based from order they refill coumadin under [facility] name per orders from hospital. Any changes that need to be made to her coumadin based on labs, the clinic has the doc of the day over at coumadin clinic verify changes and sign off.  All refills have been sent under [facility] name since pt is not established with a particular cardiologists. If we want pt to establish with a cardiologists to manage this then she will need a referral for this or get in contact with PCP to advise them they need to.  Please advise [place] thanks

## 2014-05-03 NOTE — Telephone Encounter (Signed)
Closed in error.

## 2014-05-03 NOTE — Telephone Encounter (Signed)
Please call Dr. Janifer Adie office and find out duration of therapy of warfarin.  Make sure they know pulmonary started her on warfarin late 10/2013 in hospital following PE, and that we manage her warfarin per their request, but because she doesn't have a cardiologist, that is the reason the refills are under his name.  If she needs to continue warfarin, please find out for how long, and make sure they know refills will be under Dr. Janifer Adie name.  Will then need to notify patient and let her know. Thanks.

## 2014-05-03 NOTE — Telephone Encounter (Signed)
Spoke with Dr. Janifer Adie nurse to verify the duration of Coumadin therapy. She will discuss with Dr. Gwenette Greet and inform the clinic (phone number provided). [place] clinic will continue to manage Coumadin dosing if she is to continue.   A new prescription with refills will need to be sent to her pharmacy once the duration is verified. This prescription will be sent by the [facility] clinic under Dr. Janifer Adie name.

## 2014-05-03 NOTE — Telephone Encounter (Signed)
I was never made aware of this, and obviously have not been following her for this.  Who has been signing for this all this time??

## 2014-05-04 MED ORDER — WARFARIN SODIUM 4 MG PO TABS: ORAL_TABLET | ORAL | Status: DC

## 2014-05-04 NOTE — Telephone Encounter (Signed)
Anderson [place], please let pt and coumadin clinic know that we are going to discontinue coumadin the end of the month (end of June)

## 2014-05-04 NOTE — Telephone Encounter (Signed)
Pt and Ysidro Evert at coumadin clinic are aware.[place], CMA

## 2014-05-04 NOTE — Telephone Encounter (Signed)
Ysidro Evert called from the coumadin clinic.  He stated that they will continue to follow the pt for dosing.  He stated that it looks like Dr. Elsworth Soho started the pt on the coumadin.  Ysidro Evert will let the pt know that they will continue to follow the pt and they will need to know when [facility] feels that the coumadin may be stopped.  Will forward to [facility] to make him aware.

## 2014-05-04 NOTE — Telephone Encounter (Signed)
I spoke with Truman Hayward (Triage Nurse) with Dr. Janifer Adie office and she tells me that Dr. Gwenette Greet will be okay overseeing coumadin management, he just wasn't aware that pulmonology put patient on warfarin 6 months ago.  Truman Hayward tells me that pulmonology understands they will be the ones to make decision on duration of therapy, but as long as she is on coumadin we can continue to manage coumadin, unless patient would prefer to go to the [place] office on [address].  Patient tells me she would prefer to continue to see [place] as she is familiar with [place].  Refill of warfarin sent in to [place].

## 2014-05-18 ENCOUNTER — Ambulatory Visit (INDEPENDENT_AMBULATORY_CARE_PROVIDER_SITE_OTHER): Payer: Medicare Other | Admitting: *Deleted

## 2014-05-18 DIAGNOSIS — I2699 Other pulmonary embolism without acute cor pulmonale: Secondary | ICD-10-CM

## 2014-05-18 DIAGNOSIS — Z5181 Encounter for therapeutic drug level monitoring: Secondary | ICD-10-CM | POA: Diagnosis not present

## 2014-05-18 LAB — POCT INR: INR: 2.6

## 2014-05-27 ENCOUNTER — Other Ambulatory Visit: Payer: Self-pay | Admitting: Pulmonary Disease

## 2014-06-02 ENCOUNTER — Other Ambulatory Visit: Payer: Self-pay | Admitting: Internal Medicine

## 2014-06-07 DIAGNOSIS — M1710 Unilateral primary osteoarthritis, unspecified knee: Secondary | ICD-10-CM | POA: Diagnosis not present

## 2014-06-09 ENCOUNTER — Other Ambulatory Visit: Payer: Self-pay | Admitting: Internal Medicine

## 2014-06-13 ENCOUNTER — Other Ambulatory Visit: Payer: Self-pay | Admitting: Internal Medicine

## 2014-06-13 DIAGNOSIS — I10 Essential (primary) hypertension: Secondary | ICD-10-CM

## 2014-08-22 ENCOUNTER — Ambulatory Visit (HOSPITAL_COMMUNITY)
Admission: RE | Admit: 2014-08-22 | Discharge: 2014-08-22 | Disposition: A | Discharge status: Home / Self Care | Payer: Medicare Other | Source: Ambulatory Visit | Attending: Radiation Oncology | Admitting: Radiation Oncology

## 2014-08-22 ENCOUNTER — Encounter (HOSPITAL_COMMUNITY): Payer: Self-pay

## 2014-08-22 DIAGNOSIS — J189 Pneumonia, unspecified organism: Secondary | ICD-10-CM | POA: Diagnosis not present

## 2014-08-22 DIAGNOSIS — Z9889 Other specified postprocedural states: Secondary | ICD-10-CM | POA: Diagnosis not present

## 2014-08-22 DIAGNOSIS — I2510 Atherosclerotic heart disease of native coronary artery without angina pectoris: Secondary | ICD-10-CM | POA: Insufficient documentation

## 2014-08-22 DIAGNOSIS — C3490 Malignant neoplasm of unspecified part of unspecified bronchus or lung: Secondary | ICD-10-CM | POA: Diagnosis not present

## 2014-08-25 ENCOUNTER — Encounter: Payer: Self-pay | Admitting: Radiation Oncology

## 2014-08-25 ENCOUNTER — Ambulatory Visit
Admission: RE | Admit: 2014-08-25 | Discharge: 2014-08-25 | Disposition: A | Discharge status: Home / Self Care | Payer: Medicare Other | Source: Ambulatory Visit | Attending: Radiation Oncology | Admitting: Radiation Oncology

## 2014-08-25 ENCOUNTER — Telehealth: Payer: Self-pay | Admitting: *Deleted

## 2014-08-25 VITALS — BP 152/60 | HR 58 | Resp 18 | Wt 134.5 lb

## 2014-08-25 DIAGNOSIS — C3492 Malignant neoplasm of unspecified part of left bronchus or lung: Secondary | ICD-10-CM

## 2014-08-25 NOTE — Telephone Encounter (Signed)
CALLED PATIENT TO INFORM OF TEST AND FU, NO ANSWER WILL CALL LATER.

## 2014-08-25 NOTE — Progress Notes (Signed)
Concerned about numbness felt in right hand worse at night x 2 weeks. Considering moving to an assisted living facility. Increased difficulty getting around due to arthritis in knees. Patient reports great difficulty standing and sitting. Reports cough has resolved. Reports shortness of breath when she leans over. Denies difficulty swallowing. Weight and vitals stable. Reports generalized pain.

## 2014-08-25 NOTE — Telephone Encounter (Signed)
Called patient to inform of test and fu, spoke with patient and she is aware of these appts.

## 2014-08-25 NOTE — Progress Notes (Addendum)
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  Concerned about numbness felt in right hand worse at night x 2 weeks. Considering moving to an assisted living facility. Increased difficulty getting around due to arthritis in knees. Patient reports great difficulty standing and sitting. Reports cough has resolved. Reports shortness of breath when she leans over. Denies difficulty swallowing. Weight and vitals stable. Reports generalized pain                               ALLERGIES:  is allergic to codeine and sulfamethoxazole-trimethoprim.  Meds: Current Outpatient Prescriptions  Medication Sig Dispense Refill  . albuterol (PROVENTIL HFA;VENTOLIN HFA) 108 (90 BASE) MCG/ACT inhaler Inhale 2 puffs into the lungs every 6 (six) hours as needed for wheezing or shortness of breath.      [name] amLODipine (NORVASC) 5 MG tablet TAKE 1 TABLET (5 MG TOTAL) BY MOUTH AT BEDTIME.  90 tablet  0  . atenolol (TENORMIN) 50 MG tablet Take 1 tablet (50 mg total) by mouth daily.  90 tablet  3  . cetirizine (ZYRTEC) 10 MG tablet TAKE 1 TABLET BY MOUTH EVERY DAY AT BEDTIME  30 tablet  2  . cholecalciferol (VITAMIN D) 1000 UNITS tablet Take 1,000 Units by mouth daily.      [name] levothyroxine (SYNTHROID, LEVOTHROID) 50 MCG tablet Take 1 tablet by mouth daily.       No current facility-administered medications for this encounter.    Physical Findings: The patient is in no acute distress. Patient is alert and oriented.  weight is 134 lb 8 oz (61.009 kg). Her blood pressure is  152/60 and her pulse is 58. Her respiration is 18 and oxygen saturation is 99%. . No hand numbness today, intermitant.  No significant changes.  Lab Findings: Lab Results  Component Value Date   WBC 12.2* 11/25/2013   HGB 9.3* 11/25/2013   HCT 29.1* 11/25/2013   MCV 100.0 11/25/2013   PLT 206 11/25/2013    @LASTCHEM @  Radiographic Findings: Ct Chest Wo Contrast  08/22/2014   CLINICAL DATA:  Lung cancer follow-up. Left lung cancer diagnosed in January 2014. Right lung cancer tightness more remote linear 1981. Radiation therapy complete.  EXAM: CT CHEST WITHOUT CONTRAST  TECHNIQUE: Multidetector CT imaging of the chest was performed following the standard protocol without IV contrast.  COMPARISON:  CT 02/16/2014  FINDINGS: No axillary supraclavicular lymphadenopathy. No mediastinal hilar lymphadenopathy. No pericardial fluid. Coronary calcifications are noted. Esophagus is normal.  The lung parenchyma demonstrates linear fibrotic consolidation within the medial aspect of the left upper lobe extending from the hilum to the apex. There is volume loss in the left hemi thorax. No new nodularity. Findings are stable. There is some decreased in the volume of this left upper low consolidation visually.  Right lung is clear without nodularity. There is surgical clips noted peripherally within the right lung.  Limited view of the upper abdomen demonstrates normal adrenal glands. No focal hepatic  lesion on this non contrast exam. No aggressive osseous lesion.  IMPRESSION: 1. Stable post therapy consolidation within the left upper lobe without evidence of local recurrence. 2. Stable right lung. 3. No evidence of lung cancer recurrence within the left or right lung.   Electronically Signed   By: Suzy Bouchard M.D.   On: 08/22/2014 15:12    Impression:  The patient has no evidence of recurrence.  Her hand numbness may be related to c-spine disk disease or arthritis.  Recommend monitor and follow-up with PCP.  Plan:   Chest CT and follow-up in 6 months.  _____________________________________  Sheral Apley. Tammi Klippel, M.D.

## 2014-08-31 DIAGNOSIS — M1712 Unilateral primary osteoarthritis, left knee: Secondary | ICD-10-CM | POA: Diagnosis not present

## 2014-09-13 DIAGNOSIS — M25562 Pain in left knee: Secondary | ICD-10-CM | POA: Diagnosis not present

## 2014-09-13 DIAGNOSIS — Z1389 Encounter for screening for other disorder: Secondary | ICD-10-CM | POA: Diagnosis not present

## 2014-09-13 DIAGNOSIS — Z0000 Encounter for general adult medical examination without abnormal findings: Secondary | ICD-10-CM | POA: Diagnosis not present

## 2014-09-13 DIAGNOSIS — N183 Chronic kidney disease, stage 3 (moderate): Secondary | ICD-10-CM | POA: Diagnosis not present

## 2014-09-13 DIAGNOSIS — Z23 Encounter for immunization: Secondary | ICD-10-CM | POA: Diagnosis not present

## 2014-09-13 DIAGNOSIS — C3490 Malignant neoplasm of unspecified part of unspecified bronchus or lung: Secondary | ICD-10-CM | POA: Diagnosis not present

## 2014-09-13 DIAGNOSIS — I10 Essential (primary) hypertension: Secondary | ICD-10-CM | POA: Diagnosis not present

## 2014-09-13 DIAGNOSIS — E039 Hypothyroidism, unspecified: Secondary | ICD-10-CM | POA: Diagnosis not present

## 2014-10-08 ENCOUNTER — Other Ambulatory Visit: Payer: Self-pay | Admitting: Pulmonary Disease

## 2014-10-28 DIAGNOSIS — M79675 Pain in left toe(s): Secondary | ICD-10-CM | POA: Diagnosis not present

## 2014-10-28 DIAGNOSIS — M79674 Pain in right toe(s): Secondary | ICD-10-CM | POA: Diagnosis not present

## 2014-10-31 ENCOUNTER — Encounter: Payer: Self-pay | Admitting: Pulmonary Disease

## 2014-10-31 ENCOUNTER — Ambulatory Visit (INDEPENDENT_AMBULATORY_CARE_PROVIDER_SITE_OTHER): Payer: Medicare Other | Admitting: Pulmonary Disease

## 2014-10-31 VITALS — BP 126/74 | HR 61 | Temp 98.2°F | Ht 64.0 in | Wt 133.0 lb

## 2014-10-31 DIAGNOSIS — J438 Other emphysema: Secondary | ICD-10-CM | POA: Diagnosis not present

## 2014-10-31 NOTE — Progress Notes (Signed)
   Subjective:    Patient ID: Valerie Young, female    DOB: 22-Feb-1929, 80 y.o.   MRN: [id number]  HPI The patient comes in today for follow-up of her known COPD. She has not felt that maintenance inhalers improved her breathing, and has not been able to afford them either. She has been maintained on as needed albuterol only, and rarely uses this. She tells me that she has not had an acute exacerbation or pulmonary infection since the last visit. She denies any significant cough, mucus, or congestion.   Review of Systems  Constitutional: Negative for fever and unexpected weight change.  HENT: Positive for congestion, postnasal drip and sneezing. Negative for dental problem, ear pain, nosebleeds, rhinorrhea, sinus pressure, sore throat and trouble swallowing.   Eyes: Negative for redness and itching.  Respiratory: Positive for cough. Negative for chest tightness, shortness of breath and wheezing.   Cardiovascular: Negative for palpitations and leg swelling.  Gastrointestinal: Negative for nausea and vomiting.  Genitourinary: Negative for dysuria.  Musculoskeletal: Negative for joint swelling.  Skin: Negative for rash.  Neurological: Negative for headaches.  Hematological: Does not bruise/bleed easily.  Psychiatric/Behavioral: Negative for dysphoric mood. The patient is not nervous/anxious.        Objective:   Physical Exam Well-developed female in no acute distress Nose without purulence or discharge noted Neck without lymphadenopathy or thyromegaly Chest with mildly decreased breath sounds, no active wheezing Cardiac exam with regular rate and rhythm Lower extremities with no edema, no cyanosis Alert and oriented, moves all 4 extremities.       Assessment & Plan:

## 2014-10-31 NOTE — Patient Instructions (Signed)
No change in your breathing medications.  Keep your albuterol handy in case you need. Stay active followup with me again in one year if doing well.

## 2014-10-31 NOTE — Assessment & Plan Note (Signed)
Pt continues to do well off maintenance meds, and has not had an acute exacerbation or pulmonary infection since her last visit according to her.  She is satisfied with the level of her breathing currently.  I have asked her to stay on her albuterol as needed, and to call if she has worsening breathing issues.  I will see her back in one year if she is doing well.

## 2014-11-06 ENCOUNTER — Other Ambulatory Visit: Payer: Self-pay | Admitting: Internal Medicine

## 2014-11-15 DIAGNOSIS — E782 Mixed hyperlipidemia: Secondary | ICD-10-CM | POA: Diagnosis not present

## 2015-01-21 ENCOUNTER — Other Ambulatory Visit: Payer: Self-pay | Admitting: Pulmonary Disease

## 2015-01-23 DIAGNOSIS — E782 Mixed hyperlipidemia: Secondary | ICD-10-CM | POA: Diagnosis not present

## 2015-02-21 ENCOUNTER — Other Ambulatory Visit: Payer: Self-pay | Admitting: Internal Medicine

## 2015-02-28 ENCOUNTER — Ambulatory Visit (HOSPITAL_COMMUNITY)
Admission: RE | Admit: 2015-02-28 | Discharge: 2015-02-28 | Disposition: A | Discharge status: Home / Self Care | Payer: Medicare Other | Source: Ambulatory Visit | Attending: Radiation Oncology | Admitting: Radiation Oncology

## 2015-02-28 DIAGNOSIS — J449 Chronic obstructive pulmonary disease, unspecified: Secondary | ICD-10-CM | POA: Diagnosis not present

## 2015-02-28 DIAGNOSIS — C3492 Malignant neoplasm of unspecified part of left bronchus or lung: Secondary | ICD-10-CM | POA: Diagnosis not present

## 2015-03-02 ENCOUNTER — Encounter: Payer: Self-pay | Admitting: Radiation Oncology

## 2015-03-02 ENCOUNTER — Ambulatory Visit
Admission: RE | Admit: 2015-03-02 | Discharge: 2015-03-02 | Disposition: A | Discharge status: Home / Self Care | Payer: Medicare Other | Source: Ambulatory Visit | Attending: Radiation Oncology | Admitting: Radiation Oncology

## 2015-03-02 VITALS — BP 134/57 | HR 55 | Resp 18 | Wt 133.7 lb

## 2015-03-02 DIAGNOSIS — C3492 Malignant neoplasm of unspecified part of left bronchus or lung: Secondary | ICD-10-CM

## 2015-03-02 NOTE — Progress Notes (Signed)
Weight and vitals stable. Reports generalized pain related to effects of arthritis. Reports taking Tylenol tid to manage said pain. Reports numbness in both hand but, worse in the right and at night. Reports a dry occasional cough related "to seasonal allergies." Denies SOB. Denies difficulty swallowing.

## 2015-03-02 NOTE — Progress Notes (Signed)
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 Weight and vitals stable. Reports generalized pain related to effects of arthritis. Reports taking Tylenol tid to manage said pain. Reports numbness in both hand, this is worse in the right and at night. Reports a dry occasional cough related "to seasonal allergies." Denies SOB. Denies difficulty swallowing.                                ALLERGIES:  is allergic to sulfamethoxazole-trimethoprim.  Meds: Current Outpatient Prescriptions  Medication Sig Dispense Refill  . acetaminophen-codeine (TYLENOL #3) 300-30 MG per tablet Take by mouth every 4 (four) hours as needed for moderate pain.    [name] albuterol (PROVENTIL HFA;VENTOLIN HFA) 108 (90 BASE) MCG/ACT inhaler Inhale 2 puffs into the lungs every 6 (six) hours as needed for wheezing or shortness of breath.    [name] amLODipine (NORVASC) 5 MG tablet TAKE 1 TABLET (5 MG TOTAL) BY MOUTH AT BEDTIME. 90 tablet 0  . atenolol (TENORMIN) 50 MG tablet Take 1 tablet (50 mg total) by mouth daily. 90 tablet 3  . cetirizine (ZYRTEC) 10 MG tablet TAKE 1 TABLET BY MOUTH EVERY DAY AT BEDTIME 30 tablet 2  . cholecalciferol (VITAMIN D) 1000 UNITS tablet Take 1,000 Units by mouth daily.    [name] levothyroxine (SYNTHROID, LEVOTHROID) 50 MCG tablet Take 1 tablet by mouth daily.    . pravastatin (PRAVACHOL) 40 MG tablet Take 40 mg by mouth daily.  11   No current facility-administered medications for this encounter.    Physical Findings: The patient is in no acute distress.  Patient is alert and oriented.  weight is 133 lb 11.2 oz (60.646 kg). Her blood pressure is 134/57 and her pulse is 55. Her respiration is 18 and oxygen saturation is 95%. . No hand numbness today, intermitant.  No significant changes.  Lab Findings: Lab Results  Component Value Date   WBC 12.2* 11/25/2013   HGB 9.3* 11/25/2013   HCT 29.1* 11/25/2013   MCV 100.0 11/25/2013   PLT 206 11/25/2013    @LASTCHEM @  Radiographic Findings: Ct Chest Wo Contrast  08/22/2014   CLINICAL DATA:  Lung cancer follow-up. Left lung cancer diagnosed in January 2014. Right lung cancer tightness more remote linear 1981. Radiation therapy complete.  EXAM: CT CHEST WITHOUT CONTRAST  TECHNIQUE: Multidetector CT imaging of the chest was performed following the standard protocol without IV contrast.  COMPARISON:  CT 02/16/2014  FINDINGS: No axillary supraclavicular lymphadenopathy. No mediastinal hilar lymphadenopathy. No pericardial fluid. Coronary calcifications are noted. Esophagus is normal.  The lung parenchyma demonstrates linear fibrotic consolidation within the medial aspect of the left upper lobe extending from the hilum to the apex. There is volume loss in the left hemi thorax. No new nodularity. Findings are stable. There is some decreased in the volume of this left upper low consolidation visually.  Right lung is clear without nodularity. There is surgical clips noted peripherally within the right lung.  Limited view of the upper abdomen demonstrates normal adrenal glands. No focal hepatic lesion on this non contrast exam. No aggressive osseous lesion.  IMPRESSION: 1. Stable post therapy consolidation within the left upper lobe without evidence of local recurrence. 2. Stable right lung. 3. No evidence of lung cancer recurrence within the left or right lung.   Electronically Signed   By: Suzy Bouchard M.D.   On: 08/22/2014 15:12    Impression:  The patient has no evidence of recurrence.  Her hand numbness may  be related to c-spine disk disease or arthritis.  Recommend monitor and follow-up with PCP.  Plan:  Chest CT and follow-up in 6 months.   This document serves as a record of services personally performed by Tyler Pita, MD. It was created on his behalf by Pearlie Oyster, a trained medical scribe. The creation of this record is based on the scribe's personal observations and the provider's statements to them. This document has been checked and approved by the attending provider.       ------------------------------------------------  Sheral Apley Tammi Klippel, M.D.

## 2015-04-07 ENCOUNTER — Emergency Department (HOSPITAL_COMMUNITY)
Admission: EM | Admit: 2015-04-07 | Discharge: 2015-04-07 | Disposition: A | Discharge status: Home / Self Care | Payer: Medicare Other | Attending: Emergency Medicine | Admitting: Emergency Medicine

## 2015-04-07 ENCOUNTER — Emergency Department (HOSPITAL_COMMUNITY): Payer: Medicare Other

## 2015-04-07 ENCOUNTER — Encounter (HOSPITAL_COMMUNITY): Payer: Self-pay | Admitting: Emergency Medicine

## 2015-04-07 DIAGNOSIS — Z79899 Other long term (current) drug therapy: Secondary | ICD-10-CM | POA: Insufficient documentation

## 2015-04-07 DIAGNOSIS — J449 Chronic obstructive pulmonary disease, unspecified: Secondary | ICD-10-CM | POA: Insufficient documentation

## 2015-04-07 DIAGNOSIS — R0602 Shortness of breath: Secondary | ICD-10-CM | POA: Diagnosis not present

## 2015-04-07 DIAGNOSIS — Z87891 Personal history of nicotine dependence: Secondary | ICD-10-CM | POA: Diagnosis not present

## 2015-04-07 DIAGNOSIS — R0789 Other chest pain: Secondary | ICD-10-CM | POA: Diagnosis not present

## 2015-04-07 DIAGNOSIS — Z85118 Personal history of other malignant neoplasm of bronchus and lung: Secondary | ICD-10-CM | POA: Diagnosis not present

## 2015-04-07 DIAGNOSIS — M1990 Unspecified osteoarthritis, unspecified site: Secondary | ICD-10-CM | POA: Diagnosis not present

## 2015-04-07 DIAGNOSIS — J439 Emphysema, unspecified: Secondary | ICD-10-CM | POA: Diagnosis not present

## 2015-04-07 DIAGNOSIS — I10 Essential (primary) hypertension: Secondary | ICD-10-CM | POA: Diagnosis not present

## 2015-04-07 DIAGNOSIS — E039 Hypothyroidism, unspecified: Secondary | ICD-10-CM | POA: Insufficient documentation

## 2015-04-07 DIAGNOSIS — R079 Chest pain, unspecified: Secondary | ICD-10-CM | POA: Diagnosis not present

## 2015-04-07 DIAGNOSIS — J8410 Pulmonary fibrosis, unspecified: Secondary | ICD-10-CM | POA: Diagnosis not present

## 2015-04-07 DIAGNOSIS — R109 Unspecified abdominal pain: Secondary | ICD-10-CM | POA: Diagnosis present

## 2015-04-07 LAB — CBC WITH DIFFERENTIAL/PLATELET
BASOS ABS: 0 10*3/uL (ref 0.0–0.1)
Basophils Relative: 0 % (ref 0–1)
EOS PCT: 1 % (ref 0–5)
Eosinophils Absolute: 0.1 10*3/uL (ref 0.0–0.7)
HEMATOCRIT: 40.9 % (ref 36.0–46.0)
Hemoglobin: 13 g/dL (ref 12.0–15.0)
LYMPHS ABS: 1.5 10*3/uL (ref 0.7–4.0)
Lymphocytes Relative: 14 % (ref 12–46)
MCH: 31.5 pg (ref 26.0–34.0)
MCHC: 31.8 g/dL (ref 30.0–36.0)
MCV: 99 fL (ref 78.0–100.0)
MONO ABS: 1.4 10*3/uL — AB (ref 0.1–1.0)
Monocytes Relative: 14 % — ABNORMAL HIGH (ref 3–12)
Neutro Abs: 7.4 10*3/uL (ref 1.7–7.7)
Neutrophils Relative %: 71 % (ref 43–77)
PLATELETS: 250 10*3/uL (ref 150–400)
RBC: 4.13 MIL/uL (ref 3.87–5.11)
RDW: 13.2 % (ref 11.5–15.5)
WBC: 10.4 10*3/uL (ref 4.0–10.5)

## 2015-04-07 LAB — I-STAT CHEM 8, ED
BUN: 19 mg/dL (ref 6–20)
CHLORIDE: 104 mmol/L (ref 101–111)
Calcium, Ion: 1.22 mmol/L (ref 1.13–1.30)
Creatinine, Ser: 1.4 mg/dL — ABNORMAL HIGH (ref 0.44–1.00)
Glucose, Bld: 139 mg/dL — ABNORMAL HIGH (ref 65–99)
HCT: 42 % (ref 36.0–46.0)
Hemoglobin: 14.3 g/dL (ref 12.0–15.0)
POTASSIUM: 4 mmol/L (ref 3.5–5.1)
Sodium: 141 mmol/L (ref 135–145)
TCO2: 22 mmol/L (ref 0–100)

## 2015-04-07 LAB — URINALYSIS, ROUTINE W REFLEX MICROSCOPIC
Bilirubin Urine: NEGATIVE
Glucose, UA: NEGATIVE mg/dL
Ketones, ur: NEGATIVE mg/dL
Nitrite: NEGATIVE
PH: 5.5 (ref 5.0–8.0)
Protein, ur: NEGATIVE mg/dL
Specific Gravity, Urine: 1.01 (ref 1.005–1.030)
UROBILINOGEN UA: 0.2 mg/dL (ref 0.0–1.0)

## 2015-04-07 LAB — D-DIMER, QUANTITATIVE (NOT AT ARMC): D DIMER QUANT: 1.67 ug{FEU}/mL — AB (ref 0.00–0.48)

## 2015-04-07 LAB — URINE MICROSCOPIC-ADD ON

## 2015-04-07 LAB — I-STAT TROPONIN, ED: TROPONIN I, POC: 0 ng/mL (ref 0.00–0.08)

## 2015-04-07 MED ORDER — IOHEXOL 350 MG/ML SOLN
60.0000 mL | Freq: Once | INTRAVENOUS | Status: AC | PRN
  Administered 2015-04-07: 60 mL via INTRAVENOUS

## 2015-04-07 MED ORDER — FENTANYL CITRATE (PF) 100 MCG/2ML IJ SOLN
50.0000 ug | Freq: Once | INTRAMUSCULAR | Status: AC
  Administered 2015-04-07: 50 ug via INTRAVENOUS
  Filled 2015-04-07: qty 2

## 2015-04-07 MED ORDER — TRAMADOL HCL 50 MG PO TABS: 50.0000 mg | ORAL_TABLET | Freq: Four times a day (QID) | ORAL | Status: DC | PRN

## 2015-04-07 MED ORDER — METHOCARBAMOL 500 MG PO TABS: 500.0000 mg | ORAL_TABLET | Freq: Four times a day (QID) | ORAL | Status: DC

## 2015-04-07 MED ORDER — GI COCKTAIL ~~LOC~~
30.0000 mL | Freq: Once | ORAL | Status: AC
  Administered 2015-04-07: 30 mL via ORAL
  Filled 2015-04-07: qty 30

## 2015-04-07 MED ORDER — MORPHINE SULFATE 2 MG/ML IJ SOLN
2.0000 mg | Freq: Once | INTRAMUSCULAR | Status: AC
  Administered 2015-04-07: 2 mg via INTRAVENOUS
  Filled 2015-04-07: qty 1

## 2015-04-07 MED ORDER — SODIUM CHLORIDE 0.9 % IV BOLUS (SEPSIS)
1000.0000 mL | Freq: Once | INTRAVENOUS | Status: AC
  Administered 2015-04-07: 1000 mL via INTRAVENOUS

## 2015-04-07 MED ORDER — MELOXICAM 7.5 MG PO TABS: 7.5000 mg | ORAL_TABLET | Freq: Every day | ORAL | Status: DC

## 2015-04-07 NOTE — ED Notes (Signed)
Bed: WA07 Expected date: 04/07/15 Expected time: 12:59 AM Means of arrival: Ambulance Comments: Lung ca, lung pain

## 2015-04-07 NOTE — ED Provider Notes (Addendum)
CSN: [id number]     Arrival date & time 04/07/15  0113 History   First MD Initiated Contact with Patient 04/07/15 0246     Chief Complaint  Patient presents with  . Flank Pain    Patient is complaining of left flank pain. Patient has been coughing due to allergies quite frequently. Patient is having pain every time she breathes in. Patient is from home.     (Consider location/radiation/quality/duration/timing/severity/associated sxs/prior Treatment) Patient is a 81 y.o. female presenting with chest pain. The history is provided by the patient.  Chest Pain Pain location:  L chest Pain quality: sharp   Pain radiates to:  Does not radiate Pain radiates to the back: no   Pain severity:  Moderate Onset quality:  Gradual Duration:  4 days Timing:  Constant Progression:  Unchanged Chronicity:  New Context: at rest   Relieved by:  Nothing Worsened by:  Nothing tried Ineffective treatments:  None tried Associated symptoms: cough   Associated symptoms: no abdominal pain, no anxiety, no claudication, no fever, no heartburn, no numbness, no orthopnea, no palpitations, no syncope, not vomiting and no weakness   Risk factors: no surgery     Past Medical History  Diagnosis Date  . Hypertension   . Hypothyroidism   . COPD (chronic obstructive pulmonary disease)   . DJD (degenerative joint disease)     knees  . PND (post-nasal drip)     Chronic  . H/O: lung cancer 1982    Right; treated with resection  . Cancer     right lung ca - 30 years ago / left lung - 11/2012   Past Surgical History  Procedure Laterality Date  . Total knee arthroplasty      Right  . Thyroidectomy    . Vesicovaginal fistula closure w/ tah    . Right lung cancer treated with resection  1982  . Right thigh postoperative hematoma  02-2008  . Video bronchoscopy  12/08/2012    Procedure: VIDEO BRONCHOSCOPY WITH FLUORO;  Surgeon: Kathee Delton, MD;  Location: [facility];  Service: Cardiopulmonary;  Laterality:  Bilateral;   Family History  Problem Relation Age of Onset  . CVA Mother 71    hemorrhagic    History  Substance Use Topics  . Smoking status: Former Smoker -- 1.00 packs/day for 35 years    Types: Cigarettes    Quit date: 11/25/1980  . Smokeless tobacco: Not on file  . Alcohol Use: No   OB History    No data available     Review of Systems  Constitutional: Negative for fever.  Respiratory: Positive for cough. Negative for wheezing.   Cardiovascular: Positive for chest pain. Negative for palpitations, orthopnea, claudication and syncope.  Gastrointestinal: Negative for heartburn, vomiting and abdominal pain.  Neurological: Negative for weakness and numbness.  All other systems reviewed and are negative.     Allergies  Sulfamethoxazole-trimethoprim  Home Medications   Prior to Admission medications   Medication Sig Start Date End Date Taking? Authorizing Provider  acetaminophen-codeine (TYLENOL #3) 300-30 MG per tablet Take by mouth every 4 (four) hours as needed for moderate pain.   Yes Historical Provider, MD  albuterol (PROVENTIL HFA;VENTOLIN HFA) 108 (90 BASE) MCG/ACT inhaler Inhale 2 puffs into the lungs every 6 (six) hours as needed for wheezing or shortness of breath.   Yes Historical Provider, MD  amLODipine (NORVASC) 5 MG tablet TAKE 1 TABLET (5 MG TOTAL) BY MOUTH AT BEDTIME.   Yes Olugbemiga E  Doreene Burke, MD  atenolol (TENORMIN) 50 MG tablet Take 1 tablet (50 mg total) by mouth daily. 01/06/14  Yes Tresa Garter, MD  cetirizine (ZYRTEC) 10 MG tablet TAKE 1 TABLET BY MOUTH EVERY DAY AT BEDTIME 01/23/15  Yes Kathee Delton, MD  cholecalciferol (VITAMIN D) 1000 UNITS tablet Take 1,000 Units by mouth daily.   Yes Historical Provider, MD  levothyroxine (SYNTHROID, LEVOTHROID) 50 MCG tablet Take 1 tablet by mouth daily. 09/30/12  Yes Historical Provider, MD  pravastatin (PRAVACHOL) 40 MG tablet Take 40 mg by mouth daily. 02/13/15  Yes Historical Provider, MD   BP 151/47  mmHg  Pulse 87  Temp(Src) 99.2 F (37.3 C) (Oral)  Resp 17  SpO2 98% Physical Exam  Constitutional: She is oriented to person, place, and time. She appears well-developed and well-nourished. No distress.  HENT:  Head: Normocephalic and atraumatic.  Mouth/Throat: Oropharynx is clear and moist.  Eyes: Conjunctivae are normal. Pupils are equal, round, and reactive to light.  Neck: Normal range of motion. Neck supple.  Cardiovascular: Normal rate, regular rhythm and intact distal pulses.   Pulmonary/Chest: Effort normal and breath sounds normal. No respiratory distress. She has no wheezes. She has no rales. She exhibits no tenderness.  Abdominal: Soft. Bowel sounds are normal. There is no tenderness. There is no rebound and no guarding.  Musculoskeletal: Normal range of motion. She exhibits no edema or tenderness.  Neurological: She is alert and oriented to person, place, and time.  Skin: Skin is warm and dry.  Psychiatric: She has a normal mood and affect.    ED Course  Procedures (including critical care time) Labs Review Labs Reviewed  CBC WITH DIFFERENTIAL/PLATELET - Abnormal; Notable for the following:    Monocytes Relative 14 (*)    Monocytes Absolute 1.4 (*)    All other components within normal limits  URINALYSIS, ROUTINE W REFLEX MICROSCOPIC - Abnormal; Notable for the following:    Hgb urine dipstick SMALL (*)    Leukocytes, UA TRACE (*)    All other components within normal limits  D-DIMER, QUANTITATIVE - Abnormal; Notable for the following:    D-Dimer, Quant 1.67 (*)    All other components within normal limits  URINE MICROSCOPIC-ADD ON - Abnormal; Notable for the following:    Bacteria, UA FEW (*)    All other components within normal limits  I-STAT CHEM 8, ED - Abnormal; Notable for the following:    Creatinine, Ser 1.40 (*)    Glucose, Bld 139 (*)    All other components within normal limits  I-STAT TROPOININ, ED    Imaging Review Dg Chest 2  View  04/07/2015   CLINICAL DATA:  Left lower anterior chest pain. Shortness of breath. History of lung cancer.  EXAM: CHEST  2 VIEW  COMPARISON:  CT chest 02/28/2015.  Chest 12/30/2013.  FINDINGS: Normal heart size and pulmonary vascularity. Progressive scarring and volume loss in the left upper lung since prior study suggesting postradiation changes. Surgical clips in the right upper lung. Fibrosis in the lung bases. No focal airspace disease in the lungs. No blunting of costophrenic angles. No pneumothorax. Calcification of the aorta.  IMPRESSION: Postoperative and postradiation changes in the lungs. No evidence of active pulmonary disease.   Electronically Signed   By: Lucienne Capers M.D.   On: 04/07/2015 03:47     EKG Interpretation None      MDM   Final diagnoses:  None     Date: 04/07/2015  Rate: 82  Rhythm: normal sinus rhythm  QRS Axis: normal  Intervals: prolonged PR  ST/T Wave abnormalities: normal  Conduction Disutrbances: first degree AVB  Narrative Interpretation: first degree AVB        In the setting of 4 days of ongoing pain with negative EKG and troponin ACS is excluded.  Ruled out for PE.  Will treat for MSK pain.  Follow up with your PMD closely.    Veatrice Kells, MD 04/07/15 [phone]

## 2015-04-07 NOTE — ED Notes (Signed)
Patient is complaining of left flank pain. Patient has been coughing due to allergies quite frequently. Patient is having pain every time she breathes in. Patient is from home.

## 2015-04-07 NOTE — ED Notes (Addendum)
Patient was walked to the bathroom with one man assist. Patient got tired but did not need much assistance. Vitals at 2:50 am is the vitals after patient was put back to the bed.

## 2015-04-07 NOTE — Discharge Instructions (Signed)

## 2015-05-31 ENCOUNTER — Telehealth: Payer: Self-pay | Admitting: *Deleted

## 2015-05-31 MED ORDER — CETIRIZINE HCL 10 MG PO TABS: 10.0000 mg | ORAL_TABLET | Freq: Every day | ORAL | Status: DC

## 2015-05-31 NOTE — Telephone Encounter (Signed)
Rx has been sent in under [place]. Pt is aware. Nothing further was needed.

## 2015-07-03 ENCOUNTER — Other Ambulatory Visit: Payer: Self-pay

## 2015-07-03 DIAGNOSIS — Z1231 Encounter for screening mammogram for malignant neoplasm of breast: Secondary | ICD-10-CM

## 2015-07-05 ENCOUNTER — Other Ambulatory Visit: Payer: Self-pay | Admitting: Radiation Oncology

## 2015-07-05 DIAGNOSIS — E782 Mixed hyperlipidemia: Secondary | ICD-10-CM | POA: Diagnosis not present

## 2015-07-05 DIAGNOSIS — C3492 Malignant neoplasm of unspecified part of left bronchus or lung: Secondary | ICD-10-CM

## 2015-07-05 DIAGNOSIS — M179 Osteoarthritis of knee, unspecified: Secondary | ICD-10-CM | POA: Diagnosis not present

## 2015-07-05 DIAGNOSIS — I10 Essential (primary) hypertension: Secondary | ICD-10-CM | POA: Diagnosis not present

## 2015-07-05 DIAGNOSIS — C3490 Malignant neoplasm of unspecified part of unspecified bronchus or lung: Secondary | ICD-10-CM | POA: Diagnosis not present

## 2015-07-05 DIAGNOSIS — E039 Hypothyroidism, unspecified: Secondary | ICD-10-CM | POA: Diagnosis not present

## 2015-07-05 DIAGNOSIS — Z1389 Encounter for screening for other disorder: Secondary | ICD-10-CM | POA: Diagnosis not present

## 2015-07-06 ENCOUNTER — Telehealth: Payer: Self-pay | Admitting: *Deleted

## 2015-07-06 NOTE — Telephone Encounter (Signed)
CALLED PATIENT TO INFORM OF CT FOR 08-29-15- ARRIVAL TIME - 11:15 AM @ [facility] AND HIS FU ON 08-31-15 @ 11:15 AM TO GET HER RESULTS, SPOKE WITH PATIENT AND SHE IS AWARE OF THESE APPTS.

## 2015-07-12 ENCOUNTER — Ambulatory Visit
Admission: RE | Admit: 2015-07-12 | Discharge: 2015-07-12 | Disposition: A | Discharge status: Home / Self Care | Payer: Medicare Other | Source: Ambulatory Visit

## 2015-07-12 ENCOUNTER — Ambulatory Visit: Payer: Medicare Other

## 2015-07-12 DIAGNOSIS — Z1231 Encounter for screening mammogram for malignant neoplasm of breast: Secondary | ICD-10-CM | POA: Diagnosis not present

## 2015-07-28 DIAGNOSIS — R262 Difficulty in walking, not elsewhere classified: Secondary | ICD-10-CM | POA: Diagnosis not present

## 2015-07-28 DIAGNOSIS — M25559 Pain in unspecified hip: Secondary | ICD-10-CM | POA: Diagnosis not present

## 2015-07-28 DIAGNOSIS — M1712 Unilateral primary osteoarthritis, left knee: Secondary | ICD-10-CM | POA: Diagnosis not present

## 2015-07-28 DIAGNOSIS — M25562 Pain in left knee: Secondary | ICD-10-CM | POA: Diagnosis not present

## 2015-08-04 DIAGNOSIS — M1712 Unilateral primary osteoarthritis, left knee: Secondary | ICD-10-CM | POA: Diagnosis not present

## 2015-08-04 DIAGNOSIS — M25562 Pain in left knee: Secondary | ICD-10-CM | POA: Diagnosis not present

## 2015-08-09 DIAGNOSIS — M1712 Unilateral primary osteoarthritis, left knee: Secondary | ICD-10-CM | POA: Diagnosis not present

## 2015-08-09 DIAGNOSIS — M25562 Pain in left knee: Secondary | ICD-10-CM | POA: Diagnosis not present

## 2015-08-09 DIAGNOSIS — R2689 Other abnormalities of gait and mobility: Secondary | ICD-10-CM | POA: Diagnosis not present

## 2015-08-16 DIAGNOSIS — R2689 Other abnormalities of gait and mobility: Secondary | ICD-10-CM | POA: Diagnosis not present

## 2015-08-16 DIAGNOSIS — M25562 Pain in left knee: Secondary | ICD-10-CM | POA: Diagnosis not present

## 2015-08-16 DIAGNOSIS — M1712 Unilateral primary osteoarthritis, left knee: Secondary | ICD-10-CM | POA: Diagnosis not present

## 2015-08-25 DIAGNOSIS — M25562 Pain in left knee: Secondary | ICD-10-CM | POA: Diagnosis not present

## 2015-08-25 DIAGNOSIS — R2689 Other abnormalities of gait and mobility: Secondary | ICD-10-CM | POA: Diagnosis not present

## 2015-08-25 DIAGNOSIS — M1712 Unilateral primary osteoarthritis, left knee: Secondary | ICD-10-CM | POA: Diagnosis not present

## 2015-08-29 ENCOUNTER — Ambulatory Visit (HOSPITAL_COMMUNITY)
Admission: RE | Admit: 2015-08-29 | Discharge: 2015-08-29 | Disposition: A | Discharge status: Home / Self Care | Payer: Medicare Other | Source: Ambulatory Visit | Attending: Radiation Oncology | Admitting: Radiation Oncology

## 2015-08-29 DIAGNOSIS — C3492 Malignant neoplasm of unspecified part of left bronchus or lung: Secondary | ICD-10-CM | POA: Diagnosis not present

## 2015-08-29 DIAGNOSIS — J439 Emphysema, unspecified: Secondary | ICD-10-CM | POA: Diagnosis not present

## 2015-08-29 DIAGNOSIS — R911 Solitary pulmonary nodule: Secondary | ICD-10-CM | POA: Diagnosis not present

## 2015-08-29 DIAGNOSIS — J700 Acute pulmonary manifestations due to radiation: Secondary | ICD-10-CM | POA: Insufficient documentation

## 2015-08-29 DIAGNOSIS — C3490 Malignant neoplasm of unspecified part of unspecified bronchus or lung: Secondary | ICD-10-CM | POA: Diagnosis not present

## 2015-08-29 DIAGNOSIS — I2510 Atherosclerotic heart disease of native coronary artery without angina pectoris: Secondary | ICD-10-CM | POA: Insufficient documentation

## 2015-08-29 DIAGNOSIS — R918 Other nonspecific abnormal finding of lung field: Secondary | ICD-10-CM | POA: Diagnosis not present

## 2015-08-29 IMAGING — CT CT CHEST W/O CM
2 of 4 series · 15 of 36 positions shown, 18 images · non-contrast
Comparison: [DATE]

CLINICAL DATA: Restaging lung cancer -adenocarcinoma, status post
radiation therapy

EXAM:
CT CHEST WITHOUT CONTRAST
TECHNIQUE: Multidetector CT imaging of the chest was performed following the
standard protocol without IV contrast.

[Series 2: rtn chest without st · axial · non-contrast · 0.67mm/px · z∈[-310,-44]mm · 12 of 63 slices shown, 15 images]
[im 5/63  mediastinal]
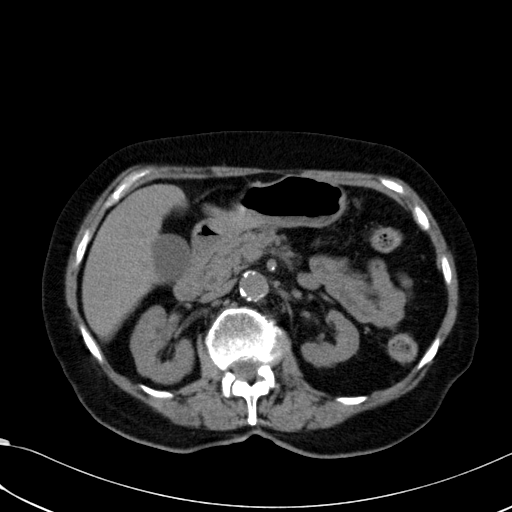
[im 5/63  lung]
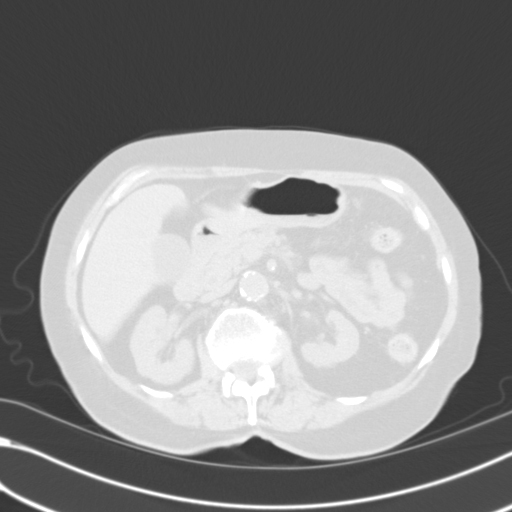
[im 9/63  lung]
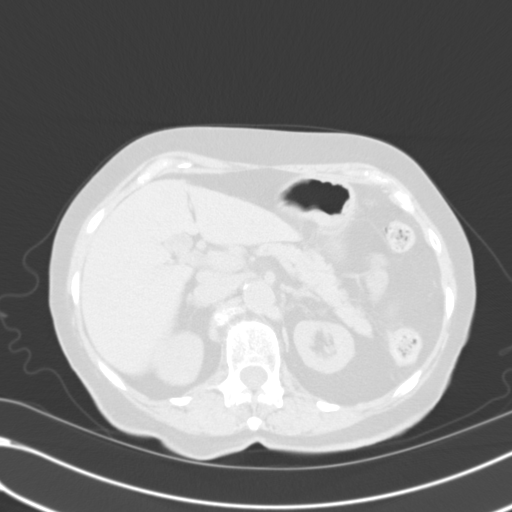
[im 14/63  lung]
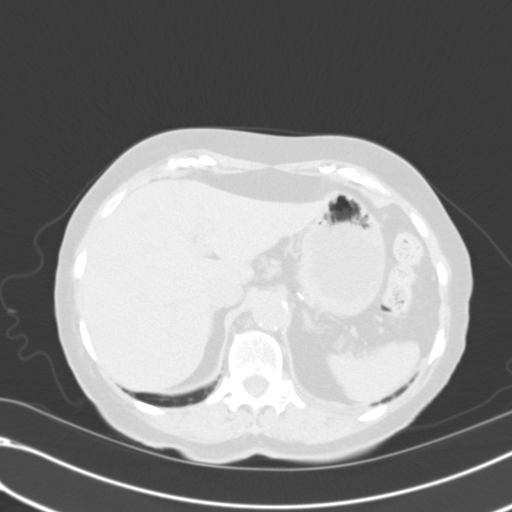
[im 18/63  lung]
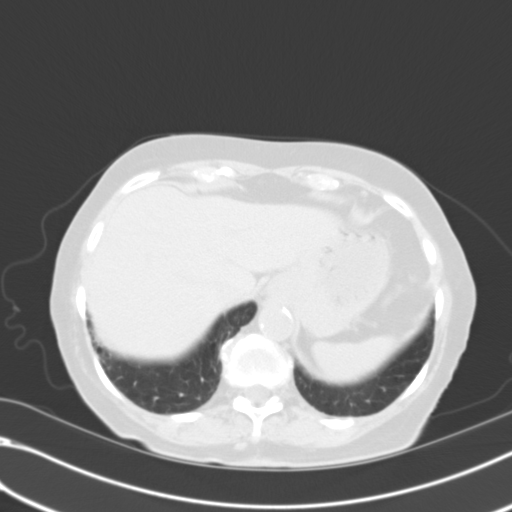
[im 23/63  mediastinal]
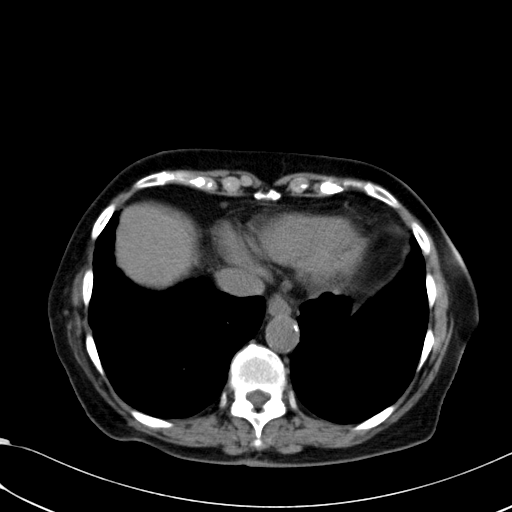
[im 23/63  lung]
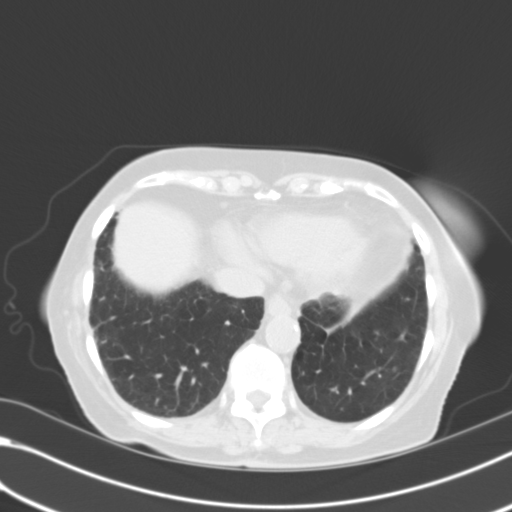
[im 27/63  lung]
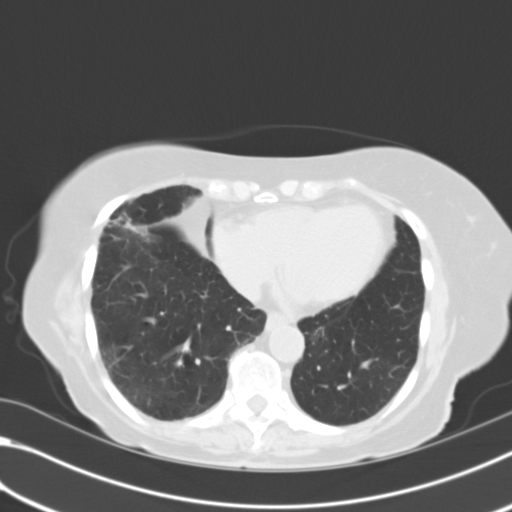
[im 36/63  lung]
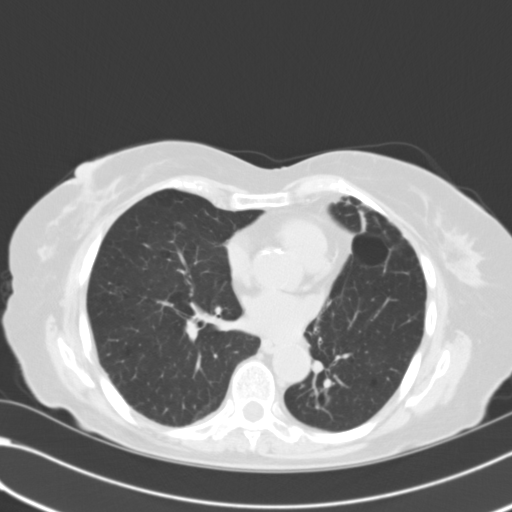
[im 40/63  lung]
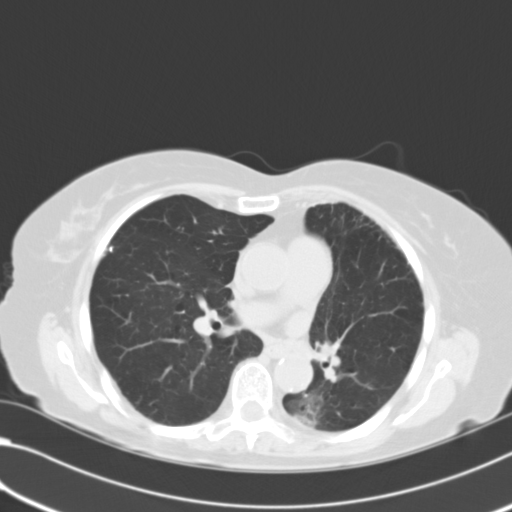
[im 45/63  mediastinal]
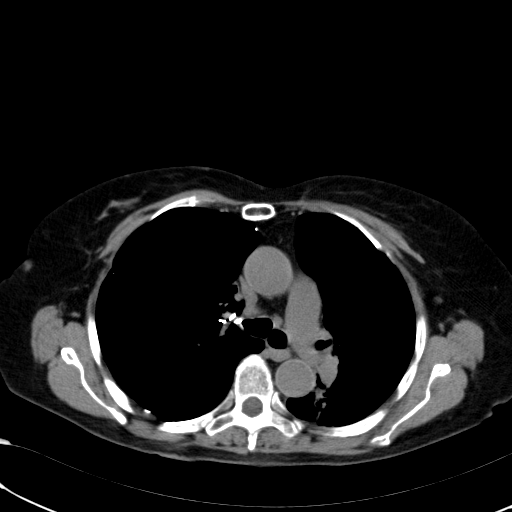
[im 45/63  lung]
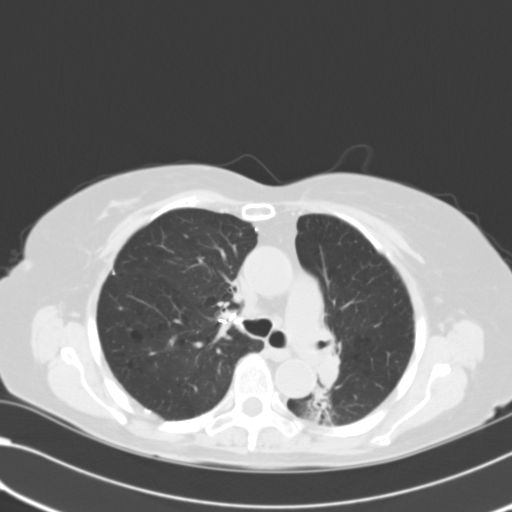
[im 49/63  lung]
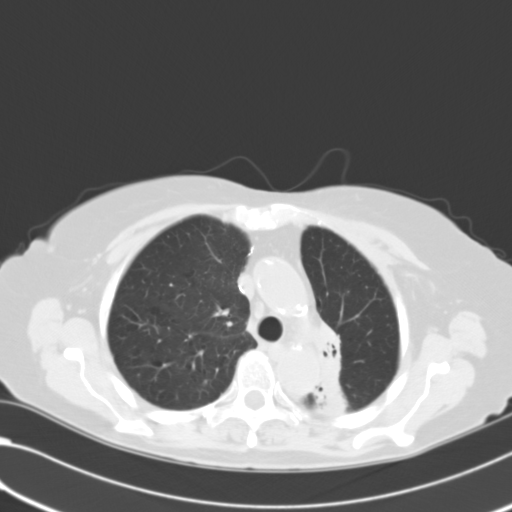
[im 54/63  lung]
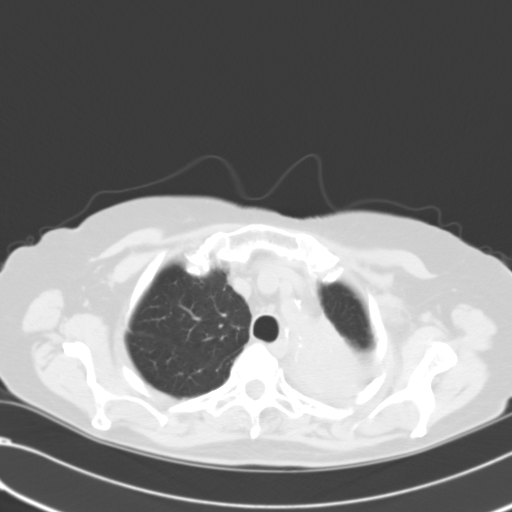
[im 58/63  lung]
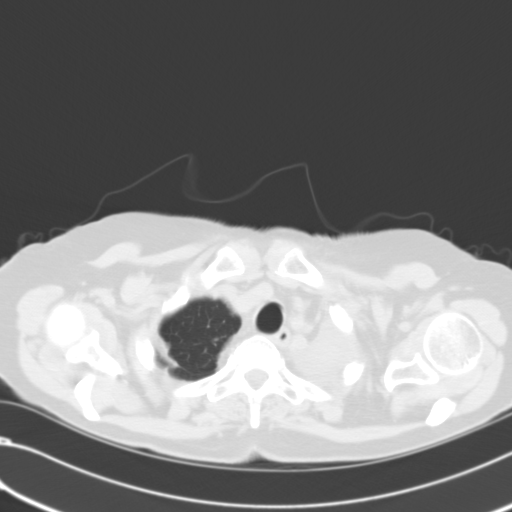

[Series 602: cor · coronal · 0.67mm/px · 3 of 67 slices shown]
[im 14/67  lung]
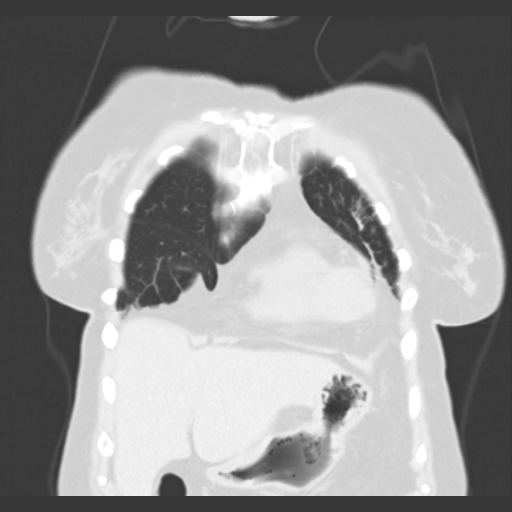
[im 27/67  lung]
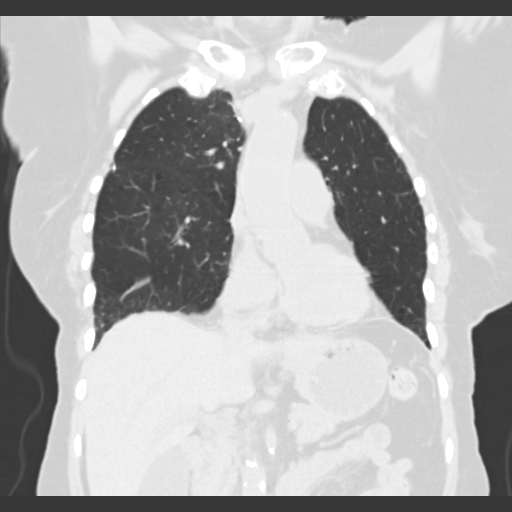
[im 40/67  lung]
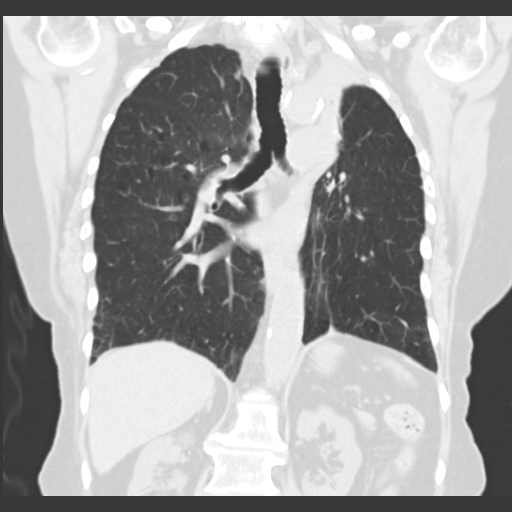

[15 of 36 positions shown; findings below may reference images not displayed]

FINDINGS: Mediastinum/Nodes: Small mediastinal lymph nodes are not
pathologically enlarged. Coronary atherosclerosis. Small type 1
hiatal hernia. Aortic and branch vessel atherosclerotic
calcification.

Lungs/Pleura: Airspace opacity in the left upper lobe and superior
segment left lower lobe persists with air bronchograms favoring
radiation pneumonitis.

Mild stable lingular scarring. Emphysema. Stable line in the
vicinity of the right major fissure. Postoperative findings along
the right posterior hemithorax. Mild scarring at the right lung apex
unchanged.

Slow but progressive enlargement and increase conspicuity of an
oval-shaped ground-glass opacity in the right lower lobe measuring
1.4 by 0.6 cm on image 30 series 5. This has very slowly progressed
over the past year.

Upper abdomen: Unremarkable

Musculoskeletal: Unremarkable
IMPRESSION: 1. Slowly progressive 1.4 by 0.6 cm ground-glass pulmonary nodule
medially in the right lower lobe on image 30 series 5, concerning
for low grade adenocarcinoma.
2. Emphysema
3. Otherwise stable appearance with airspace opacity/consolidation
in the left upper lobe and superior segment left lower lobe probably
from radiation pneumonitis.
4. Coronary, aortic arch, and branch vessel atherosclerotic vascular
disease.

## 2015-08-30 DIAGNOSIS — M25562 Pain in left knee: Secondary | ICD-10-CM | POA: Diagnosis not present

## 2015-08-30 DIAGNOSIS — M1712 Unilateral primary osteoarthritis, left knee: Secondary | ICD-10-CM | POA: Diagnosis not present

## 2015-08-30 DIAGNOSIS — R2689 Other abnormalities of gait and mobility: Secondary | ICD-10-CM | POA: Diagnosis not present

## 2015-08-30 DIAGNOSIS — R262 Difficulty in walking, not elsewhere classified: Secondary | ICD-10-CM | POA: Diagnosis not present

## 2015-08-31 ENCOUNTER — Ambulatory Visit
Admission: RE | Admit: 2015-08-31 | Discharge: 2015-08-31 | Disposition: A | Discharge status: Home / Self Care | Payer: Medicare Other | Source: Ambulatory Visit | Attending: Radiation Oncology | Admitting: Radiation Oncology

## 2015-08-31 ENCOUNTER — Encounter: Payer: Self-pay | Admitting: Radiation Oncology

## 2015-08-31 VITALS — BP 123/77 | HR 57 | Resp 16 | Wt 130.0 lb

## 2015-08-31 DIAGNOSIS — C3492 Malignant neoplasm of unspecified part of left bronchus or lung: Secondary | ICD-10-CM

## 2015-08-31 NOTE — Progress Notes (Signed)
Understanding there is an area of concern in the patient's right lung encouraged her to call this RN should hemoptysis, increased cough, or chest pain present. Patient verbalized understanding.

## 2015-08-31 NOTE — Progress Notes (Signed)
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 Her CT scan was completed on October 4th of 2016. Her voice is somewhat hoarse and she reports starting a new medication that is administered in the form of five shots. Her ambulatory status is wheel chair bound. The chart was checked and scans reviewed. The patient presents without complaint. The patient projected a healthy mental status and was not accompanied by family today.                               ALLERGIES:  is allergic to sulfamethoxazole-trimethoprim.  Meds: Current Outpatient Prescriptions  Medication Sig Dispense Refill  . acetaminophen-codeine (TYLENOL #3) 300-30 MG per tablet Take by mouth every 4 (four) hours as needed for moderate pain.    [name] albuterol (PROVENTIL HFA;VENTOLIN HFA) 108 (90 BASE) MCG/ACT inhaler Inhale 2 puffs into the lungs every 6 (six) hours as needed for wheezing or shortness of breath.    [name] amLODipine (NORVASC) 5 MG tablet TAKE 1 TABLET (5 MG TOTAL) BY MOUTH AT BEDTIME. 90 tablet 0  . atenolol (TENORMIN) 50 MG tablet Take 1 tablet (50 mg total) by mouth daily. 90 tablet 3  . cetirizine (ZYRTEC) 10 MG tablet Take 1 tablet (10 mg total) by mouth at bedtime. 30 tablet 2  . cholecalciferol (VITAMIN D) 1000 UNITS tablet Take 1,000 Units by mouth daily.    [name] levothyroxine (SYNTHROID, LEVOTHROID) 50 MCG tablet Take 1 tablet by mouth daily.    . meloxicam (MOBIC) 7.5 MG tablet Take 1 tablet (7.5 mg total) by mouth  daily. 7 tablet 0  . methocarbamol (ROBAXIN) 500 MG tablet Take 1 tablet (500 mg total) by mouth 4 (four) times daily. 14 tablet 0  . pravastatin (PRAVACHOL) 40 MG tablet Take 40 mg by mouth daily.  11  . traMADol (ULTRAM) 50 MG tablet Take 1 tablet (50 mg total) by mouth every 6 (six) hours as needed. 9 tablet 0   No current facility-administered medications for this encounter.    Physical Findings: The patient is in no acute distress. The patient is alert and oriented x3.  weight is 130 lb (58.968 kg). Her blood pressure is 123/77 and her pulse is 57. Her respiration is 16 and oxygen saturation is 98%.   Lab Findings: Lab Results  Component Value Date   WBC 10.4 04/07/2015   HGB 14.3 04/07/2015   HCT 42.0 04/07/2015   MCV 99.0 04/07/2015   PLT 250 04/07/2015    '@LASTCHEM'$ @  Radiographic Findings: Ct Chest Wo Contrast  08/29/2015   CLINICAL DATA:  Restaging lung cancer -adenocarcinoma, status post radiation therapy  EXAM: CT CHEST WITHOUT CONTRAST  TECHNIQUE: Multidetector CT imaging of the chest was performed following the standard protocol without IV contrast.  COMPARISON:  04/07/2015  FINDINGS: Mediastinum/Nodes: Small mediastinal lymph nodes are not pathologically enlarged. Coronary atherosclerosis. Small type 1 hiatal hernia. Aortic and branch vessel atherosclerotic calcification.  Lungs/Pleura: Airspace opacity in the left upper lobe and superior segment left lower lobe persists with air bronchograms favoring radiation pneumonitis.  Mild stable lingular scarring. Emphysema. Stable line in the vicinity of the right major fissure. Postoperative findings along the right posterior hemithorax. Mild scarring at the right lung apex unchanged.  Slow but progressive enlargement and increase conspicuity of an oval-shaped ground-glass opacity in the right lower lobe measuring 1.4 by 0.6 cm on image 30 series 5. This has very slowly progressed over the past year.  Upper abdomen: Unremarkable   Musculoskeletal: Unremarkable  IMPRESSION: 1. Slowly progressive 1.4 by 0.6 cm ground-glass pulmonary nodule medially in the right lower lobe on image 30 series 5, concerning for low grade adenocarcinoma. 2. Emphysema 3. Otherwise stable appearance with airspace opacity/consolidation in the left upper lobe and superior segment left lower lobe probably from radiation pneumonitis. 4. Coronary, aortic arch, and branch vessel atherosclerotic vascular disease.   Electronically Signed   By: Van Clines M.D.   On: 08/29/2015 14:36     Impression:  The patient is clinically stable with no recurrence.  She has an enlarging subsolid nodule in the left lung of uncertain significance.  The patient understands the results of her most recent CT scan and the area of concern located in her lung is slow growing. Her most recent scan results were compared to scans completed in January of 2016. Based off of these results, she understands that a CT scan is recommended to take place in three months time to monitor the area of concern located in her lungs. She was not expecting these results today and expressed some anxiety. However, it was explained that the chance of controlling this radiation is high and this helped her feel better. The patient understands that she can access her appointments and medical records via [facility]. Biopsy is not recommended at this time.  Plan:  Healthy methods of management in regards to reported symptoms were reviewed in detail. She has been advised of her CT scan to to take place in three months as scheduled and of her radiation oncology follow-up appointment to follow. These will occur on a Thursday. Based off of the results of the CT scan to take place in three months, a new treatment plan will be designed for the patient in the management of her disease. The results of her CT scan to take place in three months will be presented at lung clinic for further evaluation. Option for biopsy will  be reviewed at this time. All vocalized questions and concerns have been addressed. If the patient develops any further questions or concerns in regards to her treatment and recovery, she has been encouraged to contact Dr. Tammi Klippel, MD.   This document serves as a record of services personally performed by Tyler Pita, MD. It was created on his behalf by Lenn Cal, a trained medical scribe. The creation of this record is based on the scribe's personal observations and the provider's statements to them. This document has been checked and approved by the attending provider.  __________________________________________  Sheral Apley. Tammi Klippel, M.D.

## 2015-09-01 DIAGNOSIS — M1712 Unilateral primary osteoarthritis, left knee: Secondary | ICD-10-CM | POA: Diagnosis not present

## 2015-09-01 DIAGNOSIS — R2689 Other abnormalities of gait and mobility: Secondary | ICD-10-CM | POA: Diagnosis not present

## 2015-09-01 DIAGNOSIS — M25562 Pain in left knee: Secondary | ICD-10-CM | POA: Diagnosis not present

## 2015-09-05 ENCOUNTER — Telehealth: Payer: Self-pay | Admitting: *Deleted

## 2015-09-05 NOTE — Telephone Encounter (Signed)
CALLED PATIENT TO INFORM OF CT AND FU ,NO ANSWER WILL CALL LATER 

## 2015-09-27 DIAGNOSIS — Z23 Encounter for immunization: Secondary | ICD-10-CM | POA: Diagnosis not present

## 2015-10-02 ENCOUNTER — Emergency Department (HOSPITAL_COMMUNITY): Payer: Medicare Other

## 2015-10-02 ENCOUNTER — Encounter (HOSPITAL_COMMUNITY): Payer: Self-pay | Admitting: Emergency Medicine

## 2015-10-02 ENCOUNTER — Inpatient Hospital Stay (HOSPITAL_COMMUNITY)
Admission: EM | Admit: 2015-10-02 | Discharge: 2015-10-06 | DRG: 194 | Disposition: A | Discharge status: Home Health | Payer: Medicare Other | Attending: Internal Medicine | Admitting: Internal Medicine

## 2015-10-02 DIAGNOSIS — Z86711 Personal history of pulmonary embolism: Secondary | ICD-10-CM

## 2015-10-02 DIAGNOSIS — E785 Hyperlipidemia, unspecified: Secondary | ICD-10-CM | POA: Diagnosis present

## 2015-10-02 DIAGNOSIS — N179 Acute kidney failure, unspecified: Secondary | ICD-10-CM | POA: Diagnosis present

## 2015-10-02 DIAGNOSIS — C3412 Malignant neoplasm of upper lobe, left bronchus or lung: Secondary | ICD-10-CM | POA: Diagnosis not present

## 2015-10-02 DIAGNOSIS — W19XXXA Unspecified fall, initial encounter: Secondary | ICD-10-CM | POA: Diagnosis present

## 2015-10-02 DIAGNOSIS — Z96651 Presence of right artificial knee joint: Secondary | ICD-10-CM | POA: Diagnosis present

## 2015-10-02 DIAGNOSIS — Z923 Personal history of irradiation: Secondary | ICD-10-CM

## 2015-10-02 DIAGNOSIS — Z79899 Other long term (current) drug therapy: Secondary | ICD-10-CM | POA: Diagnosis not present

## 2015-10-02 DIAGNOSIS — J45909 Unspecified asthma, uncomplicated: Secondary | ICD-10-CM | POA: Diagnosis present

## 2015-10-02 DIAGNOSIS — Y92039 Unspecified place in apartment as the place of occurrence of the external cause: Secondary | ICD-10-CM

## 2015-10-02 DIAGNOSIS — Z87891 Personal history of nicotine dependence: Secondary | ICD-10-CM

## 2015-10-02 DIAGNOSIS — R05 Cough: Secondary | ICD-10-CM | POA: Diagnosis not present

## 2015-10-02 DIAGNOSIS — E876 Hypokalemia: Secondary | ICD-10-CM | POA: Diagnosis present

## 2015-10-02 DIAGNOSIS — Z66 Do not resuscitate: Secondary | ICD-10-CM | POA: Diagnosis present

## 2015-10-02 DIAGNOSIS — S299XXA Unspecified injury of thorax, initial encounter: Secondary | ICD-10-CM | POA: Diagnosis not present

## 2015-10-02 DIAGNOSIS — J189 Pneumonia, unspecified organism: Principal | ICD-10-CM | POA: Diagnosis present

## 2015-10-02 DIAGNOSIS — E860 Dehydration: Secondary | ICD-10-CM | POA: Diagnosis present

## 2015-10-02 DIAGNOSIS — C3490 Malignant neoplasm of unspecified part of unspecified bronchus or lung: Secondary | ICD-10-CM | POA: Diagnosis not present

## 2015-10-02 DIAGNOSIS — Z85118 Personal history of other malignant neoplasm of bronchus and lung: Secondary | ICD-10-CM

## 2015-10-02 DIAGNOSIS — R911 Solitary pulmonary nodule: Secondary | ICD-10-CM | POA: Diagnosis not present

## 2015-10-02 DIAGNOSIS — E039 Hypothyroidism, unspecified: Secondary | ICD-10-CM

## 2015-10-02 DIAGNOSIS — R0602 Shortness of breath: Secondary | ICD-10-CM | POA: Diagnosis not present

## 2015-10-02 DIAGNOSIS — I10 Essential (primary) hypertension: Secondary | ICD-10-CM | POA: Diagnosis present

## 2015-10-02 DIAGNOSIS — Z902 Acquired absence of lung [part of]: Secondary | ICD-10-CM | POA: Diagnosis not present

## 2015-10-02 DIAGNOSIS — J984 Other disorders of lung: Secondary | ICD-10-CM | POA: Diagnosis not present

## 2015-10-02 HISTORY — DX: Phlebitis and thrombophlebitis of unspecified site: I80.9

## 2015-10-02 LAB — BASIC METABOLIC PANEL
Anion gap: 15 (ref 5–15)
BUN: 23 mg/dL — AB (ref 6–20)
CO2: 19 mmol/L — ABNORMAL LOW (ref 22–32)
CREATININE: 1.55 mg/dL — AB (ref 0.44–1.00)
Calcium: 9.6 mg/dL (ref 8.9–10.3)
Chloride: 103 mmol/L (ref 101–111)
GFR calc Af Amer: 34 mL/min — ABNORMAL LOW (ref >60)
GFR, EST NON AFRICAN AMERICAN: 30 mL/min — AB (ref >60)
GLUCOSE: 215 mg/dL — AB (ref 65–99)
POTASSIUM: 3.3 mmol/L — AB (ref 3.5–5.1)
SODIUM: 137 mmol/L (ref 135–145)

## 2015-10-02 LAB — CBC
HCT: 39.9 % (ref 36.0–46.0)
Hemoglobin: 12.9 g/dL (ref 12.0–15.0)
MCH: 31.9 pg (ref 26.0–34.0)
MCHC: 32.3 g/dL (ref 30.0–36.0)
MCV: 98.8 fL (ref 78.0–100.0)
PLATELETS: 212 10*3/uL (ref 150–400)
RBC: 4.04 MIL/uL (ref 3.87–5.11)
RDW: 13.2 % (ref 11.5–15.5)
WBC: 13.5 10*3/uL — ABNORMAL HIGH (ref 4.0–10.5)

## 2015-10-02 LAB — TROPONIN I: Troponin I: <0.03 ng/mL (ref <0.031)

## 2015-10-02 LAB — D-DIMER, QUANTITATIVE (NOT AT ARMC): D DIMER QUANT: 3.04 ug{FEU}/mL — AB (ref 0.00–0.48)

## 2015-10-02 MED ORDER — AZITHROMYCIN 500 MG IV SOLR: 500.0000 mg | INTRAVENOUS | Status: DC

## 2015-10-02 MED ORDER — AZITHROMYCIN 250 MG PO TABS
500.0000 mg | ORAL_TABLET | Freq: Every day | ORAL | Status: DC
  Administered 2015-10-02: 500 mg via ORAL
  Filled 2015-10-02: qty 2

## 2015-10-02 MED ORDER — DEXTROSE 5 % IV SOLN
1.0000 g | Freq: Once | INTRAVENOUS | Status: AC
  Administered 2015-10-02: 1 g via INTRAVENOUS
  Filled 2015-10-02: qty 10

## 2015-10-02 MED ORDER — LEVOTHYROXINE SODIUM 50 MCG PO TABS
50.0000 ug | ORAL_TABLET | Freq: Every day | ORAL | Status: DC
  Administered 2015-10-03 – 2015-10-06 (×4): 50 ug via ORAL
  Filled 2015-10-02 (×4): qty 1

## 2015-10-02 MED ORDER — POTASSIUM CHLORIDE CRYS ER 20 MEQ PO TBCR
40.0000 meq | EXTENDED_RELEASE_TABLET | Freq: Once | ORAL | Status: AC
  Administered 2015-10-02: 40 meq via ORAL
  Filled 2015-10-02: qty 2

## 2015-10-02 MED ORDER — SODIUM CHLORIDE 0.9 % IV SOLN
INTRAVENOUS | Status: DC
  Administered 2015-10-02: 75 mL/h via INTRAVENOUS
  Administered 2015-10-03 – 2015-10-05 (×4): via INTRAVENOUS

## 2015-10-02 MED ORDER — DEXTROSE 5 % IV SOLN
1.0000 g | INTRAVENOUS | Status: DC
  Administered 2015-10-03 – 2015-10-05 (×3): 1 g via INTRAVENOUS
  Filled 2015-10-02 (×4): qty 10

## 2015-10-02 MED ORDER — ENOXAPARIN SODIUM 30 MG/0.3ML ~~LOC~~ SOLN
30.0000 mg | SUBCUTANEOUS | Status: DC
  Administered 2015-10-02 – 2015-10-05 (×4): 30 mg via SUBCUTANEOUS
  Filled 2015-10-02 (×4): qty 0.3

## 2015-10-02 MED ORDER — ATENOLOL 50 MG PO TABS
50.0000 mg | ORAL_TABLET | Freq: Every morning | ORAL | Status: DC
  Administered 2015-10-03 – 2015-10-06 (×4): 50 mg via ORAL
  Filled 2015-10-02 (×4): qty 1

## 2015-10-02 MED ORDER — LORATADINE 10 MG PO TABS: 10.0000 mg | ORAL_TABLET | Freq: Every day | ORAL | Status: DC | PRN

## 2015-10-02 MED ORDER — PRAVASTATIN SODIUM 40 MG PO TABS
40.0000 mg | ORAL_TABLET | Freq: Every day | ORAL | Status: DC
  Administered 2015-10-02 – 2015-10-05 (×4): 40 mg via ORAL
  Filled 2015-10-02 (×4): qty 1

## 2015-10-02 MED ORDER — IOHEXOL 350 MG/ML SOLN
100.0000 mL | Freq: Once | INTRAVENOUS | Status: AC | PRN
  Administered 2015-10-02: 70 mL via INTRAVENOUS

## 2015-10-02 NOTE — ED Notes (Signed)
Bed: UU72 Expected date:  Expected time:  Means of arrival:  Comments: 81 y/o SOB

## 2015-10-02 NOTE — ED Notes (Addendum)
Per EMS, pt from home, not on home O2, has had worsening productive cough x 5 days. Presents tachycardic at 125 sinus tach, tachypnic, SOB at 92% RA. No pain, EMS states left sided inspiratory/expiratory wheezing on arrival. Hx of lung CA, with recent finding of "something" on the right lung EMS gave 125 mg solumedrol, 10 mg Albuterol with 0.5 Atrovent in route.

## 2015-10-02 NOTE — ED Notes (Signed)
Pt can go to floor at 15:28

## 2015-10-02 NOTE — Progress Notes (Signed)
PHARMACY NOTE -  ANTIBIOTIC RENAL DOSE ADJUSTMENT   Request received for Pharmacy to assist with antibiotic renal dose adjustment.  Patient has been initiated on ceftriaxone/azithromycin for CAP.  SCr 1.55, estimated CrCl 23 ml/min  Current dosage of antibotics is appropriate and need for further dosage adjustment is unnecessary as both ceftriaxone and azithromycin do not require renal dose adjustment.  Will sign off at this time.  Please reconsult if a change in clinical status warrants re-evaluation of dosage.  Doreene Eland, PharmD, BCPS.   Pager: [phone] 10/02/2015 5:04 PM

## 2015-10-02 NOTE — H&P (Signed)
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 lung cancer, status post partial resection of lung, today presents to the hospital after patient fell in her apartment and was unable to get up for 4 hours. Patient says that she has been sick for past 1 week, she has been coughing up yellow-colored phlegm. Has been getting more short of breath on walking. EMS was called and brought the patient to the hospital. Patient was found to have elevated d-dimer 3.04, CT angiogram was done which was negative for pulmonary embolism. Showed dense consolidation within the left upper lobe representing pneumonia, also showed patchy groundglass density consolidation within the right lower lobe highly suspicious for atypical pneumonia. Other considerations including MAI and hypersensitivity pneumonitis.   she denies chest pain, no nausea vomiting or diarrhea. Denies passing out. No dysuria urgency frequency of urination. Patient also complaining of fever with chills for past 1 week.   Allergies:   Allergies  Allergen Reactions  . Other Other (See Comments)    Pt states she is allergic to a medication and can not recall what it is       Past Medical History  Diagnosis Date  . 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 Cancer ([place])     Left lung    History reviewed. No pertinent past surgical history.  Prior to Admission medications   Medication Sig Start Date End Date Taking? Authorizing Provider  acetaminophen-codeine (TYLENOL #3) 300-30 MG tablet Take 1 tablet by mouth every 4 (four) hours as needed for moderate pain or severe pain.   Yes Historical Provider, MD  atenolol (TENORMIN) 50 MG tablet Take 50 mg by mouth every morning. 07/07/15  Yes Historical Provider, MD  cetirizine (ZYRTEC) 10 MG tablet Take 10 mg by mouth daily as needed for  allergies.  09/10/15  Yes Historical Provider, MD  levothyroxine (SYNTHROID, LEVOTHROID) 50 MCG tablet Take 50 mcg by mouth daily. 09/12/15  Yes Historical Provider, MD  pravastatin (PRAVACHOL) 40 MG tablet Take 40 mg by mouth daily. 07/16/15  Yes Historical Provider, MD    Social History:  has no tobacco, alcohol, and drug history on file.  All the positives are listed in BOLD  Review of Systems:  HEENT: Headache, blurred vision, runny nose, sore throat Neck: Hypothyroidism, hyperthyroidism,,lymphadenopathy Chest : Shortness of breath, history of COPD, Asthma Heart : Chest pain, history of coronary arterey disease GI:  Nausea, vomiting, diarrhea, constipation, GERD GU: Dysuria, urgency, frequency of urination, hematuria Neuro: Stroke, seizures, syncope Psych: Depression, anxiety, hallucinations   Physical Exam: Blood pressure 123/56, pulse 95, temperature 99.2 F (37.3 C), temperature source Oral, resp. rate 32, SpO2 94 %. Constitutional:   Patient is a well-developed and well-nourished * in no acute distress and cooperative with exam. Head: Normocephalic and atraumatic Mouth: Mucus membranes moist Eyes: PERRL, EOMI, conjunctivae normal Neck: Supple, No Thyromegaly Cardiovascular: RRR, S1 normal, S2 normal Pulmonary/Chest: Decreased breath sounds bilaterally  Abdominal: Soft. Non-tender, non-distended, bowel sounds are normal, no masses, organomegaly, or guarding present.  Neurological: A&O x3, Strength is normal and symmetric bilaterally, cranial nerve II-XII are grossly intact, no focal motor deficit, sensory intact to light touch bilaterally.  Extremities : No Cyanosis, Clubbing or Edema  Labs on Admission:  Basic Metabolic Panel:  Recent Labs Lab 10/02/15 1147  NA  137  K 3.3*  CL 103  CO2 19*  GLUCOSE 215*  BUN 23*  CREATININE 1.55*  CALCIUM 9.6     Recent Labs Lab 10/02/15 1147  WBC 13.5*  HGB 12.9  HCT 39.9  MCV 98.8  PLT 212   Cardiac  Enzymes:  Recent Labs Lab 10/02/15 1147  TROPONINI <0.03     Radiological Exams on Admission: Dg Chest 2 View  10/02/2015  CLINICAL DATA:  81 year old patient found down from fall yesterday. Initial encounter. Current history of lung cancer status post radiation therapy. EXAM: CHEST  2 VIEW COMPARISON:  Chest CT 08/29/2015 and earlier. FINDINGS: Semi upright AP and lateral views of the chest. Stable large lung volumes. Stable cardiac size and mediastinal contours. Postoperative and post radiation changes to the superior mediastinum bilaterally. No superimposed pneumothorax, pulmonary edema, pleural effusion, or acute pulmonary opacity identified. Stable visualized osseous structures. IMPRESSION: Stable post treatment appearance of the chest. No superimposed acute findings are identified. Electronically Signed   By: Genevie Ann M.D.   On: 10/02/2015 11:51   Ct Angio Chest Pe W/cm &/or Wo Cm  10/02/2015  CLINICAL DATA:  Worsening productive cough for 5 days. Shortness of breath. History of lung cancer. Elevated D-dimer. EXAM: CT ANGIOGRAPHY CHEST WITH CONTRAST TECHNIQUE: Multidetector CT imaging of the chest was performed using the standard protocol during bolus administration of intravenous contrast. Multiplanar CT image reconstructions and MIPs were obtained to evaluate the vascular anatomy. CONTRAST:  54m OMNIPAQUE IOHEXOL 350 MG/ML SOLN COMPARISON:  None. FINDINGS: There is no pulmonary embolism identified within the main, lobar, or segmental pulmonary arteries bilaterally. Scattered atherosclerotic changes noted along the walls of the normal-caliber thoracic aorta. No aortic aneurysm or dissection. Heart size is upper normal. No pericardial effusion. Coronary artery calcifications noted. There are surgical changes in the right hilum compatible with a previous partial lobectomy. Patchy ground-glass density consolidations are seen within the posterior aspects of the right lower lobe, with some additional  small scattered nodular consolidations and mild associated tree-in-bud pattern. Dense consolidation is present within the left upper lobe posteriorly extending upwards towards the left lung apex. Additional small nodular consolidations are present at the left lung base posteriorly. There are scattered emphysematous changes within each lung, mild to moderate in degree. No pleural effusion. No pneumothorax. No enlarged lymph nodes within the mediastinum or axillary regions. Limited images of the upper abdomen are unremarkable. No acute osseous abnormality seen. Review of the MIP images confirms the above findings. IMPRESSION: 1. Dense consolidation within the left upper lobe almost certainly representing pneumonia. Other possibility would be airspace collapse related to an occult obstructing central airway lesion so recommend follow-up chest CT after appropriate antibiotic therapy to ensure resolution. 2. Patchy, although fairly large, ground-glass density consolidations within the right lower lobe. This is also highly suspicious for an atypical pneumonia such as fungal or viral. Given the additional small nodular consolidations within each lung, and tree-in-bud pattern, other considerations including MAI and hypersensitivity pneumonitis. Tuberculosis is less likely given the lower lobe distribution. 3. No pulmonary embolism seen. 4. Heart size is upper normal.  No pericardial effusion. 5. Coronary artery calcifications, particularly dense within the left anterior descending coronary artery. Recommend correlation with any possible associated cardiac symptoms. 6. Emphysematous changes bilaterally, mild to moderate in degree. 7. Surgical changes about the right hilum compatible with given history of a previous partial lung resection for lung cancer. Electronically Signed   By: SFranki CabotM.D.   On: 10/02/2015  14:18    EKG: Independently reviewed. Sinus tachycardia   Assessment/Plan Active Problems:   CAP  (community acquired pneumonia)   Lung cancer ([place])   Hypothyroidism  Community acquired pneumonia Patient presenting with Coumadin for pneumonia, will admit start antibiotics IV Rocephin and Zithromax. Obtain urine for Legionella antigen as well as urinary strep pneumonia antigen. Obtain blood cultures 2. Also obtain sputum culture.  MAI versus hypersensitivity pneumonitis CT chest shows groundglass opacity in the right lower lobe. Considerations including MAI versus hyper since today pneumonitis, fungal versus viral pneumonia, TB less likely. Gold quantiferon test has been ordered.  Hypothyroidism Continue Synthroid  Hypokalemia With his potassium and check BMP in a.m.  History of lung cancer Patient followed by Dr. Tammi Klippel as outpatient. No notes available in the Epic.  DVT prophylaxis Lovenox  Code status: DO NOT RESUSCITATE  Family discussion: No family present at bedside   Time Spent on Admission: 60 minutes   [facility] Pager: [phone] 10/02/2015, 3:28 PM  If 7PM-7AM, please contact night-coverage  [email or web address]  Password [email or web address]

## 2015-10-02 NOTE — ED Provider Notes (Signed)
CSN: [id number]     Arrival date & time 10/02/15  0946 History   First MD Initiated Contact with Patient 10/02/15 [phone]     Chief Complaint  Patient presents with  . Respiratory Distress  . Shortness of Breath  . Cough    HPI Patient presents to the emergency room with complaints of worsening respiratory symptoms over the last 5 days. Initially she thought she had a cold. She was having nasal congestion and coughing that started on Thursday symptoms progressed over the weekend. This morning she was feeling more short of breath. She called EMS and they brought her to the emergency room. They noticed that she was wheezing and she was given an albuterol Atrovent treatment as well as Solu-Medrol. This treatments have helped. She is not quite as short of breath that she was initially. Initially she denied any chest pain but she says since she has been in the emergency room she has had some sharp pain on the left chest.  She has had some fevers and chills. She denies any vomiting or diarrhea. She does have history of asthma wheezing. She also has history of lung cancer and pulmonary embolism. Past Medical History  Diagnosis Date  . 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 Cancer ([place])     Left lung   History reviewed. No pertinent past surgical history. History reviewed. No pertinent family history. Social History  Substance Use Topics  . Smoking status: None  . Smokeless tobacco: None  . Alcohol Use: None   OB History    No data available     Review of Systems  All other systems reviewed and are negative.     Allergies  Other  Home Medications   Prior to Admission medications   Medication Sig Start Date End Date Taking? Authorizing Provider  acetaminophen-codeine (TYLENOL #3) 300-30 MG tablet Take 1 tablet by mouth every 4 (four) hours as needed for moderate pain or severe pain.   Yes Historical Provider, MD  atenolol (TENORMIN) 50 MG tablet Take 50 mg by mouth every  morning. 07/07/15  Yes Historical Provider, MD  cetirizine (ZYRTEC) 10 MG tablet Take 10 mg by mouth daily as needed for allergies.  09/10/15  Yes Historical Provider, MD  levothyroxine (SYNTHROID, LEVOTHROID) 50 MCG tablet Take 50 mcg by mouth daily. 09/12/15  Yes Historical Provider, MD  pravastatin (PRAVACHOL) 40 MG tablet Take 40 mg by mouth daily. 07/16/15  Yes Historical Provider, MD   BP 123/56 mmHg  Pulse 95  Temp(Src) 99.2 F (37.3 C) (Oral)  Resp 32  SpO2 94% Physical Exam  Constitutional: He appears well-developed and well-nourished. No distress.  HENT:  Head: Normocephalic and atraumatic.  Right Ear: External ear normal.  Left Ear: External ear normal.  Eyes: Conjunctivae are normal. Right eye exhibits no discharge. Left eye exhibits no discharge. No scleral icterus.  Neck: Neck supple. No tracheal deviation present.  Cardiovascular: Regular rhythm and intact distal pulses.  Tachycardia present.   Pulmonary/Chest: Accessory muscle usage present. No stridor. No respiratory distress. He has decreased breath sounds. He has wheezes. He has no rales.  Wheezing on end expiration, patient able to speak in full sentences  Abdominal: Soft. Bowel sounds are normal. He exhibits no distension. There is no tenderness. There is no rebound and no guarding.  Musculoskeletal: He exhibits no edema or tenderness.  Neurological: He is alert. He has normal strength. No cranial nerve deficit (no facial droop, extraocular movements intact, no  slurred speech) or sensory deficit. He exhibits normal muscle tone. He displays no seizure activity. Coordination normal.  Skin: Skin is warm and dry. No rash noted.  Psychiatric: He has a normal mood and affect.  Nursing note and vitals reviewed.   ED Course  Procedures (including critical care time) Labs Review Labs Reviewed  CBC - Abnormal; Notable for the following:    WBC 13.5 (*)    All other components within normal limits  BASIC METABOLIC PANEL -  Abnormal; Notable for the following:    Potassium 3.3 (*)    CO2 19 (*)    Glucose, Bld 215 (*)    BUN 23 (*)    Creatinine, Ser 1.55 (*)    GFR calc non Af Amer 30 (*)    GFR calc Af Amer 34 (*)    All other components within normal limits  D-DIMER, QUANTITATIVE (NOT AT [facility]) - Abnormal; Notable for the following:    D-Dimer, Quant 3.04 (*)    All other components within normal limits  TROPONIN I    Imaging Review Dg Chest 2 View  10/02/2015  CLINICAL DATA:  81 year old patient found down from fall yesterday. Initial encounter. Current history of lung cancer status post radiation therapy. EXAM: CHEST  2 VIEW COMPARISON:  Chest CT 08/29/2015 and earlier. FINDINGS: Semi upright AP and lateral views of the chest. Stable large lung volumes. Stable cardiac size and mediastinal contours. Postoperative and post radiation changes to the superior mediastinum bilaterally. No superimposed pneumothorax, pulmonary edema, pleural effusion, or acute pulmonary opacity identified. Stable visualized osseous structures. IMPRESSION: Stable post treatment appearance of the chest. No superimposed acute findings are identified. Electronically Signed   By: Genevie Ann M.D.   On: 10/02/2015 11:51   Ct Angio Chest Pe W/cm &/or Wo Cm  10/02/2015  CLINICAL DATA:  Worsening productive cough for 5 days. Shortness of breath. History of lung cancer. Elevated D-dimer. EXAM: CT ANGIOGRAPHY CHEST WITH CONTRAST TECHNIQUE: Multidetector CT imaging of the chest was performed using the standard protocol during bolus administration of intravenous contrast. Multiplanar CT image reconstructions and MIPs were obtained to evaluate the vascular anatomy. CONTRAST:  76m OMNIPAQUE IOHEXOL 350 MG/ML SOLN COMPARISON:  None. FINDINGS: There is no pulmonary embolism identified within the main, lobar, or segmental pulmonary arteries bilaterally. Scattered atherosclerotic changes noted along the walls of the normal-caliber thoracic aorta. No aortic  aneurysm or dissection. Heart size is upper normal. No pericardial effusion. Coronary artery calcifications noted. There are surgical changes in the right hilum compatible with a previous partial lobectomy. Patchy ground-glass density consolidations are seen within the posterior aspects of the right lower lobe, with some additional small scattered nodular consolidations and mild associated tree-in-bud pattern. Dense consolidation is present within the left upper lobe posteriorly extending upwards towards the left lung apex. Additional small nodular consolidations are present at the left lung base posteriorly. There are scattered emphysematous changes within each lung, mild to moderate in degree. No pleural effusion. No pneumothorax. No enlarged lymph nodes within the mediastinum or axillary regions. Limited images of the upper abdomen are unremarkable. No acute osseous abnormality seen. Review of the MIP images confirms the above findings. IMPRESSION: 1. Dense consolidation within the left upper lobe almost certainly representing pneumonia. Other possibility would be airspace collapse related to an occult obstructing central airway lesion so recommend follow-up chest CT after appropriate antibiotic therapy to ensure resolution. 2. Patchy, although fairly large, ground-glass density consolidations within the right lower lobe. This is  also highly suspicious for an atypical pneumonia such as fungal or viral. Given the additional small nodular consolidations within each lung, and tree-in-bud pattern, other considerations including MAI and hypersensitivity pneumonitis. Tuberculosis is less likely given the lower lobe distribution. 3. No pulmonary embolism seen. 4. Heart size is upper normal.  No pericardial effusion. 5. Coronary artery calcifications, particularly dense within the left anterior descending coronary artery. Recommend correlation with any possible associated cardiac symptoms. 6. Emphysematous changes  bilaterally, mild to moderate in degree. 7. Surgical changes about the right hilum compatible with given history of a previous partial lung resection for lung cancer. Electronically Signed   By: Franki Cabot M.D.   On: 10/02/2015 14:18   I have personally reviewed and evaluated these images and lab results as part of my medical decision-making.   EKG Interpretation   Date/Time:  Monday October 02 2015 10:02:59 EST Ventricular Rate:  146 PR Interval:  82 QRS Duration: 97 QT Interval:  309 QTC Calculation: 482 R Axis:   -89 Text Interpretation:  probable sinus tachcyardia LAD, consider left  anterior fascicular block Consider anterior infarct Repolarization  abnormality, prob rate related No previous tracing Artifact Confirmed by  Hajra Port  MD-J, Shayonna Ocampo ([id number]) on 10/02/2015 11:15:04 AM      MDM   Final diagnoses:  CAP (community acquired pneumonia)    The patient's respiratory symptoms improved with the steroids and the albuterol treatment given by EMS. Her laboratory tests were significant for an elevated d-dimer. CT scan was performed to evaluate for pulmonary embolism. CT scan is suggestive of a pneumonia without evidence of pulmonary embolism. Post obstructive pna is another concerns. There are also some concerning features for an atypical infection. IV antibiotics been ordered. I ordered respiratory isolation and added on a Quantiferon gold assay.    Dorie Rank, MD 10/02/15 1450

## 2015-10-02 NOTE — ED Notes (Signed)
Pt granddaughter named Darden Amber wants to be notified if there are any changes to patient status.  [phone]

## 2015-10-03 DIAGNOSIS — C3490 Malignant neoplasm of unspecified part of unspecified bronchus or lung: Secondary | ICD-10-CM | POA: Insufficient documentation

## 2015-10-03 DIAGNOSIS — J189 Pneumonia, unspecified organism: Secondary | ICD-10-CM | POA: Insufficient documentation

## 2015-10-03 DIAGNOSIS — J984 Other disorders of lung: Secondary | ICD-10-CM

## 2015-10-03 DIAGNOSIS — Z902 Acquired absence of lung [part of]: Secondary | ICD-10-CM

## 2015-10-03 LAB — CBC
HCT: 38.7 % (ref 36.0–46.0)
Hemoglobin: 12.5 g/dL (ref 12.0–15.0)
MCH: 31.9 pg (ref 26.0–34.0)
MCHC: 32.3 g/dL (ref 30.0–36.0)
MCV: 98.7 fL (ref 78.0–100.0)
PLATELETS: 191 10*3/uL (ref 150–400)
RBC: 3.92 MIL/uL (ref 3.87–5.11)
RDW: 13.5 % (ref 11.5–15.5)
WBC: 16.5 10*3/uL — ABNORMAL HIGH (ref 4.0–10.5)

## 2015-10-03 LAB — HIV ANTIBODY (ROUTINE TESTING W REFLEX): HIV SCREEN 4TH GENERATION: NONREACTIVE

## 2015-10-03 LAB — COMPREHENSIVE METABOLIC PANEL
ALK PHOS: 76 U/L (ref 38–126)
ALT: 22 U/L (ref 14–54)
AST: 31 U/L (ref 15–41)
Albumin: 3.3 g/dL — ABNORMAL LOW (ref 3.5–5.0)
Anion gap: 8 (ref 5–15)
BUN: 25 mg/dL — ABNORMAL HIGH (ref 6–20)
CALCIUM: 9.3 mg/dL (ref 8.9–10.3)
CHLORIDE: 111 mmol/L (ref 101–111)
CO2: 22 mmol/L (ref 22–32)
CREATININE: 1.24 mg/dL — AB (ref 0.44–1.00)
GFR, EST AFRICAN AMERICAN: 45 mL/min — AB (ref >60)
GFR, EST NON AFRICAN AMERICAN: 39 mL/min — AB (ref >60)
Glucose, Bld: 168 mg/dL — ABNORMAL HIGH (ref 65–99)
Potassium: 4.2 mmol/L (ref 3.5–5.1)
Sodium: 141 mmol/L (ref 135–145)
Total Bilirubin: 0.6 mg/dL (ref 0.3–1.2)
Total Protein: 7 g/dL (ref 6.5–8.1)

## 2015-10-03 LAB — STREP PNEUMONIAE URINARY ANTIGEN: Strep Pneumo Urinary Antigen: NEGATIVE

## 2015-10-03 MED ORDER — GUAIFENESIN ER 600 MG PO TB12
1200.0000 mg | ORAL_TABLET | Freq: Two times a day (BID) | ORAL | Status: DC
  Administered 2015-10-03 – 2015-10-06 (×7): 1200 mg via ORAL
  Filled 2015-10-03 (×7): qty 2

## 2015-10-03 MED ORDER — DOXYCYCLINE HYCLATE 100 MG PO TABS
100.0000 mg | ORAL_TABLET | Freq: Two times a day (BID) | ORAL | Status: DC
  Administered 2015-10-03 – 2015-10-06 (×7): 100 mg via ORAL
  Filled 2015-10-03 (×7): qty 1

## 2015-10-03 NOTE — Consult Note (Signed)
[name]ious Disease    Date of Admission:  10/02/2015  Date of Consult:  10/03/2015  Reason for Consult: rule out TB Referring Physician: Dr. Darrick Meigs   HPI: Valerie Young is an 81 y.o. female with known adenocarcinoma of the lung sp prior lobectomy and then successful treatment with XRT by Dr. Tammi Klippel. Note her Medical Records are currently under a different MRN including a recent CT scan in October raising concern for adenocarcinoma recurrence. The patient developed sudden onset of fevers, chills, drenching sweats and cough within the past 7 days and sought care today via ED. She had CT which read as showing what was thought to be clear consolidation from pyogenic process as well as ground glass opacities that could be c/w atypical infection. Concern for TB was raised and pt placed into airborne precautions.  On thorough discussion with the pt and her daughter the pt had felt in her usual state of health prior to onset of symptoms a week ago. She denies prior daily cough, weight loss or fevers.   She has had no contact with person with TB. She was born in [place], then moved to [facility] then to [facility]. She travelled outside of [place] once to [place] but otherwise no travel outside [place] at all. She has never had +TB skin test or even been tested for TB. She is followed closely by both Dr. Tammi Klippel from [place] and by [facility].     Past Medical History  Diagnosis Date  . 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 Cancer ([place])     Left lung  . Hypertension     Past Surgical History  Procedure Laterality Date  . Joint replacement      RIGHT TOTAL KNEE  . Cataract extraction, bilateral    . Thyroid surgery    . Abdominal hysterectomy      PARTIAL  . Right upper lobe removed    . Radiation of left lung      Social History:  reports that she has quit smoking. Her smoking use included Cigarettes. She has never used smokeless tobacco.  She reports that she does not drink alcohol or use illicit drugs.   History reviewed. No pertinent family history.  Pertinent family hx ws reviewed and is present in her other MRN chart. No hx of CTD   Allergies  Allergen Reactions  . Other Other (See Comments)    Pt states she is allergic to a medication and can not recall what it is      Medications: I have reviewed patients current medications as documented in Epic Anti-infectives    Start     Dose/Rate Route Frequency Ordered Stop   10/03/15 1700  azithromycin (ZITHROMAX) 500 mg in dextrose 5 % 250 mL IVPB  Status:  Discontinued     500 mg 250 mL/hr over 60 Minutes Intravenous Every 24 hours 10/02/15 1649 10/03/15 1113   10/03/15 1600  cefTRIAXone (ROCEPHIN) 1 g in dextrose 5 % 50 mL IVPB     1 g 100 mL/hr over 30 Minutes Intravenous Every 24 hours 10/02/15 1649 10/10/15 1559   10/03/15 1230  doxycycline (VIBRA-TABS) tablet 100 mg     100 mg Oral Every 12 hours 10/03/15 1142     10/02/15 1445  cefTRIAXone (ROCEPHIN) 1 g in dextrose 5 % 50 mL IVPB     1 g 100 mL/hr over 30 Minutes  Intravenous  Once 10/02/15 1441 10/02/15 1641   10/02/15 1445  azithromycin (ZITHROMAX) tablet 500 mg  Status:  Discontinued     500 mg Oral Daily 10/02/15 1441 10/02/15 1649         ROS: as per HPI otherwise negative on 12 pt ROS   Blood pressure 166/76, pulse 81, temperature 97.5 F (36.4 C), temperature source Oral, resp. rate 22, height _0  (1.626 m), weight 124 lb 5.4 oz (56.4 kg), SpO2 93 %. General: Alert and awake, oriented x3, not in any acute distress. HEENT: anicteric sclera,  EOMI, oropharynx clear and without exudate Cardiovascular: regular rate, normal r,  no murmur rubs or gallops Pulmonary: fairly clear to auscultation bilaterally, no wheezing, some diminised breath sounds on the left Gastrointestinal: soft nontender, nondistended, normal bowel sounds, Musculoskeletal: no  clubbing or edema noted bilaterally Skin, soft  tissue: no rashes Neuro: nonfocal, strength and sensation intact   Results for orders placed or performed during the hospital encounter of 10/02/15 (from the past 48 hour(s))  CBC     Status: Abnormal   Collection Time: 10/02/15 11:47 AM  Result Value Ref Range   WBC 13.5 (H) 4.0 - 10.5 K/uL   RBC 4.04 3.87 - 5.11 MIL/uL   Hemoglobin 12.9 12.0 - 15.0 g/dL   HCT 39.9 36.0 - 46.0 %   MCV 98.8 78.0 - 100.0 fL   MCH 31.9 26.0 - 34.0 pg   MCHC 32.3 30.0 - 36.0 g/dL   RDW 13.2 11.5 - 15.5 %   Platelets 212 150 - 400 K/uL  Basic metabolic panel     Status: Abnormal   Collection Time: 10/02/15 11:47 AM  Result Value Ref Range   Sodium 137 135 - 145 mmol/L   Potassium 3.3 (L) 3.5 - 5.1 mmol/L   Chloride 103 101 - 111 mmol/L   CO2 19 (L) 22 - 32 mmol/L   Glucose, Bld 215 (H) 65 - 99 mg/dL   BUN 23 (H) 6 - 20 mg/dL   Creatinine, Ser 1.55 (H) 0.44 - 1.00 mg/dL   Calcium 9.6 8.9 - 10.3 mg/dL   GFR calc non Af Amer 30 (L) >60 mL/min   GFR calc Af Amer 34 (L) >60 mL/min    Comment: (NOTE) The eGFR has been calculated using the CKD EPI equation. This calculation has not been validated in all clinical situations. eGFR's persistently <60 mL/min signify possible Chronic Kidney Disease.    Anion gap 15 5 - 15  D-dimer, quantitative (not at [facility])     Status: Abnormal   Collection Time: 10/02/15 11:47 AM  Result Value Ref Range   D-Dimer, Quant 3.04 (H) 0.00 - 0.48 ug/mL-FEU    Comment:        AT THE INHOUSE ESTABLISHED CUTOFF VALUE OF 0.48 ug/mL FEU, THIS ASSAY HAS BEEN DOCUMENTED IN THE LITERATURE TO HAVE A SENSITIVITY AND NEGATIVE PREDICTIVE VALUE OF AT LEAST 98 TO 99%.  THE TEST RESULT SHOULD BE CORRELATED WITH AN ASSESSMENT OF THE CLINICAL PROBABILITY OF DVT / VTE.   Troponin I     Status: None   Collection Time: 10/02/15 11:47 AM  Result Value Ref Range   Troponin I <0.03 <0.031 ng/mL    Comment:        NO INDICATION OF MYOCARDIAL INJURY.   HIV antibody     Status: None     Collection Time: 10/02/15  5:15 PM  Result Value Ref Range   HIV Screen 4th Generation wRfx Non  Reactive Non Reactive    Comment: (NOTE) Performed At: [facility] [place], [place] [id number] Lindon Romp MD AQ:[id number]   Culture, blood (routine x 2) Call MD if unable to obtain prior to antibiotics being given     Status: None (Preliminary result)   Collection Time: 10/02/15  5:15 PM  Result Value Ref Range   Specimen Description BLOOD RIGHT ARM    Special Requests BOTTLES DRAWN AEROBIC AND ANAEROBIC  5CC    Culture      NO GROWTH < 24 HOURS Performed at [facility]    Report Status PENDING   Culture, blood (routine x 2) Call MD if unable to obtain prior to antibiotics being given     Status: None (Preliminary result)   Collection Time: 10/02/15  5:20 PM  Result Value Ref Range   Specimen Description BLOOD RIGHT WRIST    Special Requests BOTTLES DRAWN AEROBIC AND ANAEROBIC  10CC    Culture      NO GROWTH < 24 HOURS Performed at [facility]    Report Status PENDING   CBC     Status: Abnormal   Collection Time: 10/03/15  5:25 AM  Result Value Ref Range   WBC 16.5 (H) 4.0 - 10.5 K/uL   RBC 3.92 3.87 - 5.11 MIL/uL   Hemoglobin 12.5 12.0 - 15.0 g/dL   HCT 38.7 36.0 - 46.0 %   MCV 98.7 78.0 - 100.0 fL   MCH 31.9 26.0 - 34.0 pg   MCHC 32.3 30.0 - 36.0 g/dL   RDW 13.5 11.5 - 15.5 %   Platelets 191 150 - 400 K/uL  Comprehensive metabolic panel     Status: Abnormal   Collection Time: 10/03/15  5:25 AM  Result Value Ref Range   Sodium 141 135 - 145 mmol/L   Potassium 4.2 3.5 - 5.1 mmol/L    Comment: RESULT REPEATED AND VERIFIED DELTA CHECK NOTED NO VISIBLE HEMOLYSIS    Chloride 111 101 - 111 mmol/L   CO2 22 22 - 32 mmol/L   Glucose, Bld 168 (H) 65 - 99 mg/dL   BUN 25 (H) 6 - 20 mg/dL   Creatinine, Ser 1.24 (H) 0.44 - 1.00 mg/dL   Calcium 9.3 8.9 - 10.3 mg/dL   Total Protein 7.0 6.5 - 8.1 g/dL   Albumin 3.3 (L) 3.5 - 5.0 g/dL    AST 31 15 - 41 U/L   ALT 22 14 - 54 U/L   Alkaline Phosphatase 76 38 - 126 U/L   Total Bilirubin 0.6 0.3 - 1.2 mg/dL   GFR calc non Af Amer 39 (L) >60 mL/min   GFR calc Af Amer 45 (L) >60 mL/min    Comment: (NOTE) The eGFR has been calculated using the CKD EPI equation. This calculation has not been validated in all clinical situations. eGFR's persistently <60 mL/min signify possible Chronic Kidney Disease.    Anion gap 8 5 - 15   _0 (sdes,specrequest,cult,reptstatus)   ) Recent Results (from the past 720 hour(s))  Culture, blood (routine x 2) Call MD if unable to obtain prior to antibiotics being given     Status: None (Preliminary result)   Collection Time: 10/02/15  5:15 PM  Result Value Ref Range Status   Specimen Description BLOOD RIGHT ARM  Final   Special Requests BOTTLES DRAWN AEROBIC AND ANAEROBIC  5CC  Final   Culture   Final    NO GROWTH < 24 HOURS Performed at [facility]    Report Status PENDING  Incomplete  Culture, blood (routine x 2) Call MD if unable to obtain prior to antibiotics being given     Status: None (Preliminary result)   Collection Time: 10/02/15  5:20 PM  Result Value Ref Range Status   Specimen Description BLOOD RIGHT WRIST  Final   Special Requests BOTTLES DRAWN AEROBIC AND ANAEROBIC  10CC  Final   Culture   Final    NO GROWTH < 24 HOURS Performed at [facility]    Report Status PENDING  Incomplete     Impression/Recommendation  Active Problems:   CAP (community acquired pneumonia)   Lung cancer ([place])   Hypothyroidism   Valerie Young is a 81 y.o. female with  Hx of adenocarcnoma of the lung sp resection of lung in [facility], then XRT here in [place] by Dr. Tammi Klippel with recent CT in October concerning for potential recurrence of adenocarcinoma but at that time rx not pursued given no symptoms and stability of lesions now with concern for CAP+/- atypical infection and possibly TB  #1 Rule out TB: Clinically she  does not have a history concerning for TB at all and I have reviewed her CT with radiology  I would NOT collect AFB sputa as we might collect an M avium that might be colonizing her and create anxiety about + AFB smear due to NTM  I am dc'ing airborne precautions and would dc QF gold   #2 CAP: Would continue to treat for CAP with rocephin and doxycycline for atypicals  Would let Dr. Tammi Klippel know that she has been admitted as well as [facility]  Another explanation for he symptoms could be progression of her known adenocarcinoma though bulk of ssx prior to admission appear to be acute  I will sign off for now  Please call with further questions     10/03/2015, 3:09 PM   Thank you so much for this interesting consult  [facility] [place] [phone] (pager) [phone] (office) 10/03/2015, 3:09 PM  [place] 10/03/2015, 3:09 PM

## 2015-10-03 NOTE — Progress Notes (Signed)
[facility] PROGRESS NOTE  Valerie Young PZW:[id number] DOB: Mar 24, 1929 DOA: 10/02/2015 PCP: No PCP Per Patient  Assessment/Plan:  Community acquired pneumonia Patient presenting with community acquired pneumonia, patient started on  IV Rocephin and Zithromax. Obtained urine for Legionella antigen as well as urinary strep pneumonia antigen. Obtain blood cultures 2. Follow culture results  Antibiotics changed per ID to ceftriaxone and doxycycline.  MAI versus hypersensitivity pneumonitis CT chest shows groundglass opacity in the right lower lobe. Considerations including MAI versus hyper since today pneumonitis, fungal versus viral pneumonia, TB less likely. Gold quantiferon test was ordered on admission. Patient was put on a AFB precautions. ID was consulted, and after discussion with radiology Dr. Lucianne Lei dam has discontinued AFB precautions and sputum for AFB along with Gold quantiferon  as patient less likely has TB.   Adenocarcinoma of the lung- patient has history of adenocarcinoma of lung status post resection of lung and radiation treatment per Dr. Tammi Klippel. Recent CT in October was concerning for potential recurrence of adenocarcinoma but at that time treatment was not pursued given no symptoms and stability of lesions. I have called Dr. Kara Mead who will see the patient in a.m. Tried to contact Dr. Tammi Klippel, who is off today. Consider calling Dr. Tammi Klippel in a.m. to let him know that patient is in the hospital.  Hypothyroidism Continue Synthroid  Hypokalemia With his potassium and check BMP in a.m.  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 lung cancer, status post partial resection of lung, today presents to the hospital after patient fell in her apartment and was unable to get up for 4 hours. Patient says that she has been sick for past 1 week, she has been coughing up yellow-colored phlegm. Has been getting more short of breath on walking. EMS was called and brought the patient to the hospital. Patient was found to have elevated d-dimer 3.04, CT angiogram was done which was negative for pulmonary embolism. Showed dense consolidation within the left upper lobe representing pneumonia, also showed patchy groundglass density consolidation within the right lower lobe highly suspicious for atypical pneumonia. Other considerations including MAI and hypersensitivity pneumonitis.   This morning patient denies any chest pain or shortness of breath.  Objective: Filed Vitals:   10/03/15 1439  BP: 166/76  Pulse: 81  Temp: 97.5 F (36.4 C)  Resp: 22    Intake/Output Summary (Last 24 hours) at 10/03/15 1643 Last data filed at 10/03/15 1300  Gross per 24 hour  Intake 1347.5 ml  Output    450 ml  Net  897.5 ml   Filed Weights   10/02/15 1635  Weight: 56.4 kg (124 lb 5.4 oz)    Exam:   General:  Appears in no acute distress  Cardiovascular: S1-S2 regular  Respiratory: Clear to auscultation bilaterally  Abdomen: Soft, nontender, no organomegaly  Musculoskeletal: No cyanosis/clubbing/edema lower extremities  Data Reviewed: Basic Metabolic Panel:  Recent Labs Lab 10/02/15 1147 10/03/15 0525  NA 137 141  K 3.3* 4.2  CL 103 111  CO2 19* 22  GLUCOSE 215* 168*  BUN 23* 25*  CREATININE 1.55* 1.24*  CALCIUM 9.6 9.3   Liver Function Tests:  Recent Labs Lab 10/03/15 0525  AST 31  ALT 22  ALKPHOS 76  BILITOT 0.6  PROT 7.0  ALBUMIN 3.3*   CBC:  Recent Labs Lab  10/02/15 1147 10/03/15 0525  WBC 13.5* 16.5*  HGB 12.9 12.5  HCT 39.9 38.7  MCV 98.8 98.7  PLT 212 191   Cardiac Enzymes:  Recent Labs Lab 10/02/15 1147  TROPONINI <0.03    CBG: No results for input(s): GLUCAP in the last 168 hours.  Recent Results (from the past 240 hour(s))  Culture, blood (routine x 2) Call MD if unable to obtain prior to antibiotics being given     Status: None (Preliminary result)   Collection Time: 10/02/15  5:15 PM  Result Value Ref Range Status   Specimen Description BLOOD RIGHT ARM  Final   Special Requests BOTTLES DRAWN AEROBIC AND ANAEROBIC  5CC  Final   Culture   Final    NO GROWTH < 24 HOURS Performed at [facility]    Report Status PENDING  Incomplete  Culture, blood (routine x 2) Call MD if unable to obtain prior to antibiotics being given     Status: None (Preliminary result)   Collection Time: 10/02/15  5:20 PM  Result Value Ref Range Status   Specimen Description BLOOD RIGHT WRIST  Final   Special Requests BOTTLES DRAWN AEROBIC AND ANAEROBIC  10CC  Final   Culture   Final    NO GROWTH < 24 HOURS Performed at [facility]    Report Status PENDING  Incomplete     Studies: Dg Chest 2 View  10/02/2015  CLINICAL DATA:  81 year old patient found down from fall yesterday. Initial encounter. Current history of lung cancer status post radiation therapy. EXAM: CHEST  2 VIEW COMPARISON:  Chest CT 08/29/2015 and earlier. FINDINGS: Semi upright AP and lateral views of the chest. Stable large lung volumes. Stable cardiac size and mediastinal contours. Postoperative and post radiation changes to the superior mediastinum bilaterally. No superimposed pneumothorax, pulmonary edema, pleural effusion, or acute pulmonary opacity identified. Stable visualized osseous structures. IMPRESSION: Stable post treatment appearance of the chest. No superimposed acute findings are identified. Electronically Signed   By: Genevie Ann M.D.   On: 10/02/2015  11:51   Ct Angio Chest Pe W/cm &/or Wo Cm  10/03/2015  ADDENDUM REPORT: 10/03/2015 13:12 ADDENDUM: Aspiration pneumonitis would be an additional consideration for the ground-glass consolidations. Electronically Signed   By: Franki Cabot M.D.   On: 10/03/2015 13:12  10/03/2015  ADDENDUM REPORT: 10/03/2015 12:56 ADDENDUM: Prior studies are now available, including chest CTs dated 08/29/2015, 04/07/2015, and 02/28/2015. The dense consolidation within the left upper lobe is unchanged compared to multiple prior studies, the stability suggesting benignity, described as post treatment consolidation on previous reports. The peripheral ground-glass density consolidations within the posterior aspects of the right lower lobe are new, as are the scattered areas of tree-in-bud within the right upper lobe and left lower lobe. Again, this most likely represents atypical pneumonia such as fungal, viral or MAI. Other considerations listed in the initial impression. Tuberculosis less likely given the distribution. The rapid development argues against bronchoalveolar cell carcinoma or other neoplastic process but would recommend follow-up chest CT to ensure resolution. Please note that the recent chest CT of 08/29/2015 did describe a small (1.4 x 0.6 cm) ground-glass pulmonary nodule within the medial aspects of the right lower lobe possibly, described as concerning for developing  low-grade adenocarcinoma. This small ground-glass pulmonary nodule is again seen at the periphery of the new larger ground-glass density consolidations, without significant short-term interval change. These results were discussed with Dr. Drucilla Schmidt on 10/03/2015. Electronically Signed   By: Franki Cabot M.D.   On: 10/03/2015 12:56  10/03/2015  CLINICAL DATA:  Worsening productive cough for 5 days. Shortness of breath. History of lung cancer. Elevated D-dimer. EXAM: CT ANGIOGRAPHY CHEST WITH CONTRAST TECHNIQUE: Multidetector CT imaging of the chest was  performed using the standard protocol during bolus administration of intravenous contrast. Multiplanar CT image reconstructions and MIPs were obtained to evaluate the vascular anatomy. CONTRAST:  40m OMNIPAQUE IOHEXOL 350 MG/ML SOLN COMPARISON:  None. FINDINGS: There is no pulmonary embolism identified within the main, lobar, or segmental pulmonary arteries bilaterally. Scattered atherosclerotic changes noted along the walls of the normal-caliber thoracic aorta. No aortic aneurysm or dissection. Heart size is upper normal. No pericardial effusion. Coronary artery calcifications noted. There are surgical changes in the right hilum compatible with a previous partial lobectomy. Patchy ground-glass density consolidations are seen within the posterior aspects of the right lower lobe, with some additional small scattered nodular consolidations and mild associated tree-in-bud pattern. Dense consolidation is present within the left upper lobe posteriorly extending upwards towards the left lung apex. Additional small nodular consolidations are present at the left lung base posteriorly. There are scattered emphysematous changes within each lung, mild to moderate in degree. No pleural effusion. No pneumothorax. No enlarged lymph nodes within the mediastinum or axillary regions. Limited images of the upper abdomen are unremarkable. No acute osseous abnormality seen. Review of the MIP images confirms the above findings. IMPRESSION: 1. Dense consolidation within the left upper lobe almost certainly representing pneumonia. Other possibility would be airspace collapse related to an occult obstructing central airway lesion so recommend follow-up chest CT after appropriate antibiotic therapy to ensure resolution. 2. Patchy, although fairly large, ground-glass density consolidations within the right lower lobe. This is also highly suspicious for an atypical pneumonia such as fungal or viral. Given the additional small nodular  consolidations within each lung, and tree-in-bud pattern, other considerations including MAI and hypersensitivity pneumonitis. Tuberculosis is less likely given the lower lobe distribution. 3. No pulmonary embolism seen. 4. Heart size is upper normal.  No pericardial effusion. 5. Coronary artery calcifications, particularly dense within the left anterior descending coronary artery. Recommend correlation with any possible associated cardiac symptoms. 6. Emphysematous changes bilaterally, mild to moderate in degree. 7. Surgical changes about the right hilum compatible with given history of a previous partial lung resection for lung cancer. Electronically Signed: By: SFranki CabotM.D. On: 10/02/2015 14:18    Scheduled Meds: . atenolol  50 mg Oral q morning - 10a  . cefTRIAXone (ROCEPHIN)  IV  1 g Intravenous Q24H  . doxycycline  100 mg Oral Q12H  . enoxaparin (LOVENOX) injection  30 mg Subcutaneous Q24H  . guaiFENesin  1,200 mg Oral BID  . levothyroxine  50 mcg Oral QAC breakfast  . pravastatin  40 mg Oral q1800   Continuous Infusions: . sodium chloride 75 mL/hr at 10/03/15 1632    Active Problems:   CAP (community acquired pneumonia)   Lung cancer ([place]   Hypothyroidism   Adenocarcinoma, lung ([place]   Atypical pneumonia    Time spent: 25 min    [facility] Pager [phone] If 7PM-7AM, please contact night-coverage at [email or web address], password [facility]11/06/2015, 4:43 PM  LOS: 1 day

## 2015-10-04 DIAGNOSIS — Z85118 Personal history of other malignant neoplasm of bronchus and lung: Secondary | ICD-10-CM

## 2015-10-04 DIAGNOSIS — R911 Solitary pulmonary nodule: Secondary | ICD-10-CM

## 2015-10-04 DIAGNOSIS — J189 Pneumonia, unspecified organism: Principal | ICD-10-CM

## 2015-10-04 DIAGNOSIS — C3490 Malignant neoplasm of unspecified part of unspecified bronchus or lung: Secondary | ICD-10-CM

## 2015-10-04 LAB — BASIC METABOLIC PANEL
ANION GAP: 8 (ref 5–15)
BUN: 31 mg/dL — ABNORMAL HIGH (ref 6–20)
CHLORIDE: 109 mmol/L (ref 101–111)
CO2: 24 mmol/L (ref 22–32)
Calcium: 9.2 mg/dL (ref 8.9–10.3)
Creatinine, Ser: 1.18 mg/dL — ABNORMAL HIGH (ref 0.44–1.00)
GFR calc Af Amer: 48 mL/min — ABNORMAL LOW (ref >60)
GFR, EST NON AFRICAN AMERICAN: 41 mL/min — AB (ref >60)
GLUCOSE: 102 mg/dL — AB (ref 65–99)
POTASSIUM: 3.9 mmol/L (ref 3.5–5.1)
Sodium: 141 mmol/L (ref 135–145)

## 2015-10-04 LAB — CBC
HEMATOCRIT: 39.3 % (ref 36.0–46.0)
HEMOGLOBIN: 12.6 g/dL (ref 12.0–15.0)
MCH: 31.7 pg (ref 26.0–34.0)
MCHC: 32.1 g/dL (ref 30.0–36.0)
MCV: 98.7 fL (ref 78.0–100.0)
PLATELETS: 216 10*3/uL (ref 150–400)
RBC: 3.98 MIL/uL (ref 3.87–5.11)
RDW: 13.5 % (ref 11.5–15.5)
WBC: 20 10*3/uL — AB (ref 4.0–10.5)

## 2015-10-04 LAB — INFLUENZA PANEL BY PCR (TYPE A & B)
H1N1 flu by pcr: NOT DETECTED
INFLBPCR: NEGATIVE
Influenza A By PCR: NEGATIVE

## 2015-10-04 LAB — PROCALCITONIN: Procalcitonin: 0.92 ng/mL

## 2015-10-04 MED ORDER — IPRATROPIUM-ALBUTEROL 0.5-2.5 (3) MG/3ML IN SOLN: 3.0000 mL | Freq: Four times a day (QID) | RESPIRATORY_TRACT | Status: DC | PRN

## 2015-10-04 NOTE — Consult Note (Signed)
Name: Valerie Young MRN: [id number] DOB: 1929-03-18    ADMISSION DATE:  10/02/2015 CONSULTATION DATE:  11/9  REFERRING MD :  Karleen Hampshire ([facility])   CHIEF COMPLAINT:  PNA   BRIEF PATIENT DESCRIPTION: 81yo female with hx HTN, COPD, hx PE/DVT, lung ca s/p RULobectomy and LUL XRT with previous radiation pneumonitis.  She presented 11/7 with 2 day hx sneezing, hoarse voice, sore throat, fevers, chills, malaise.  Admitted by [facility] with ?CAP.  CT chest showed RLL ground glass opacities, initially placed on airborne precautions, but ID thought TB highly unlikely and these were removed.  PCCM consulted 11/9 for ?MAI v atypical PNA.   SIGNIFICANT EVENTS    STUDIES:  CT chest 11/8>>> LUL dense consolidation unchanged r/t post treatment consolidation, RLL peripheral ground glass opacities, LLL scattered tree-in-bud.  TB less likely given distribution.  Small RLL ground glass nodule unchanged.    HISTORY OF PRESENT ILLNESS:  81yo female with hx HTN, COPD, hx PE/DVT, lung ca s/p RULobectomy and LUL XRT with previous radiation pneumonitis.  She presented 11/7 with 2 day hx sneezing, hoarse voice, sore throat, fevers, chills, malaise.  Admitted by [facility] with ?CAP.  CT chest showed RLL ground glass opacities, initially placed on airborne precautions, but ID thought TB highly unlikely and these were removed.  PCCM consulted 11/9 for ?MAI v atypical PNA.   Prior to this, pt was feeling well and in her usual state of health.  She states that her first symptom was significant sneezing, nasal congestion, dry cough then "laryngitis" and fevers/chills.  Denies abd pain, n/v/d, purulent sputum, hemoptysis, chest pain, leg/calf pain.  No known TB exposures, no previous TB skin test that she can remember.  Only travel outside [place] was to [place] many years ago.  One dog at home which she has had for 15 years.   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  has past surgical history that includes Joint replacement; Cataract extraction, bilateral; Thyroid surgery; Abdominal hysterectomy; RIGHT UPPER LOBE REMOVED; and RADIATION OF LEFT LUNG. Prior to Admission medications   Medication Sig Start Date End Date Taking? Authorizing Provider  acetaminophen-codeine (TYLENOL #3) 300-30 MG tablet Take 1 tablet by mouth every 4 (four) hours as needed for moderate pain or severe pain.   Yes Historical Provider, MD  atenolol (TENORMIN) 50 MG tablet Take 50 mg by mouth every morning. 07/07/15  Yes Historical Provider, MD  cetirizine (ZYRTEC) 10 MG tablet Take 10 mg by mouth daily as needed for allergies.  09/10/15  Yes Historical Provider, MD  levothyroxine (SYNTHROID, LEVOTHROID) 50 MCG tablet Take 50 mcg by mouth daily. 09/12/15  Yes Historical Provider, MD  pravastatin (PRAVACHOL) 40 MG tablet Take 40 mg by mouth daily. 07/16/15  Yes Historical Provider, MD   Allergies  Allergen Reactions  . Other Other (See Comments)    Pt states she is allergic to a medication and can not recall what it is     FAMILY HISTORY:  family history is not on file. SOCIAL HISTORY:  reports that she has quit smoking. Her smoking use included Cigarettes. She has never used smokeless tobacco. She reports that she does not drink alcohol or use illicit drugs.  REVIEW OF SYSTEMS:   As per HPI - All other systems reviewed and were neg.    SUBJECTIVE:   VITAL SIGNS: Temp:  [97.5 F (36.4 C)-98.2 F (36.8 C)] 98.2  F (36.8 C) (11/09 4696) Pulse Rate:  [67-81] 70 (11/09 0619) Resp:  [20-22] 20 (11/09 0619) BP: (155-166)/(61-76) 155/61 mmHg (11/09 0619) SpO2:  [93 %-96 %] 94 % (11/09 0619)  PHYSICAL EXAMINATION: General:  Pleasant elderly female, appears younger than stated age, NAD  Neuro:  Awake, alert, appropriate, MAe  HEENT:  Mm moist, no JVD  Cardiovascular:  s1s2 rrr Lungs:  resps even non labored on RA, diminished R apex and L base, few  scattered crackles  Abdomen:  Round, soft, non tender  Musculoskeletal:  Warm and dry, scant BLE edema    Recent Labs Lab 10/02/15 1147 10/03/15 0525 10/04/15 0517  NA 137 141 141  K 3.3* 4.2 3.9  CL 103 111 109  CO2 19* 22 24  BUN 23* 25* 31*  CREATININE 1.55* 1.24* 1.18*  GLUCOSE 215* 168* 102*    Recent Labs Lab 10/02/15 1147 10/03/15 0525 10/04/15 0517  HGB 12.9 12.5 12.6  HCT 39.9 38.7 39.3  WBC 13.5* 16.5* 20.0*  PLT 212 191 216   Dg Chest 2 View  10/02/2015  CLINICAL DATA:  81 year old patient found down from fall yesterday. Initial encounter. Current history of lung cancer status post radiation therapy. EXAM: CHEST  2 VIEW COMPARISON:  Chest CT 08/29/2015 and earlier. FINDINGS: Semi upright AP and lateral views of the chest. Stable large lung volumes. Stable cardiac size and mediastinal contours. Postoperative and post radiation changes to the superior mediastinum bilaterally. No superimposed pneumothorax, pulmonary edema, pleural effusion, or acute pulmonary opacity identified. Stable visualized osseous structures. IMPRESSION: Stable post treatment appearance of the chest. No superimposed acute findings are identified. Electronically Signed   By: Genevie Ann M.D.   On: 10/02/2015 11:51   Ct Angio Chest Pe W/cm &/or Wo Cm  10/03/2015  ADDENDUM REPORT: 10/03/2015 13:12 ADDENDUM: Aspiration pneumonitis would be an additional consideration for the ground-glass consolidations. Electronically Signed   By: Franki Cabot M.D.   On: 10/03/2015 13:12  10/03/2015  ADDENDUM REPORT: 10/03/2015 12:56 ADDENDUM: Prior studies are now available, including chest CTs dated 08/29/2015, 04/07/2015, and 02/28/2015. The dense consolidation within the left upper lobe is unchanged compared to multiple prior studies, the stability suggesting benignity, described as post treatment consolidation on previous reports. The peripheral ground-glass density consolidations within the posterior aspects of the  right lower lobe are new, as are the scattered areas of tree-in-bud within the right upper lobe and left lower lobe. Again, this most likely represents atypical pneumonia such as fungal, viral or MAI. Other considerations listed in the initial impression. Tuberculosis less likely given the distribution. The rapid development argues against bronchoalveolar cell carcinoma or other neoplastic process but would recommend follow-up chest CT to ensure resolution. Please note that the recent chest CT of 08/29/2015 did describe a small (1.4 x 0.6 cm) ground-glass pulmonary nodule within the medial aspects of the right lower lobe possibly, described as concerning for developing low-grade adenocarcinoma. This small ground-glass pulmonary nodule is again seen at the periphery of the new larger ground-glass density consolidations, without significant short-term interval change. These results were discussed with Dr. Drucilla Schmidt on 10/03/2015. Electronically Signed   By: Franki Cabot M.D.   On: 10/03/2015 12:56  10/03/2015  CLINICAL DATA:  Worsening productive cough for 5 days. Shortness of breath. History of lung cancer. Elevated D-dimer. EXAM: CT ANGIOGRAPHY CHEST WITH CONTRAST TECHNIQUE: Multidetector CT imaging of the chest was performed using the standard protocol during bolus administration of intravenous contrast. Multiplanar CT image reconstructions and  MIPs were obtained to evaluate the vascular anatomy. CONTRAST:  15m OMNIPAQUE IOHEXOL 350 MG/ML SOLN COMPARISON:  None. FINDINGS: There is no pulmonary embolism identified within the main, lobar, or segmental pulmonary arteries bilaterally. Scattered atherosclerotic changes noted along the walls of the normal-caliber thoracic aorta. No aortic aneurysm or dissection. Heart size is upper normal. No pericardial effusion. Coronary artery calcifications noted. There are surgical changes in the right hilum compatible with a previous partial lobectomy. Patchy ground-glass density  consolidations are seen within the posterior aspects of the right lower lobe, with some additional small scattered nodular consolidations and mild associated tree-in-bud pattern. Dense consolidation is present within the left upper lobe posteriorly extending upwards towards the left lung apex. Additional small nodular consolidations are present at the left lung base posteriorly. There are scattered emphysematous changes within each lung, mild to moderate in degree. No pleural effusion. No pneumothorax. No enlarged lymph nodes within the mediastinum or axillary regions. Limited images of the upper abdomen are unremarkable. No acute osseous abnormality seen. Review of the MIP images confirms the above findings. IMPRESSION: 1. Dense consolidation within the left upper lobe almost certainly representing pneumonia. Other possibility would be airspace collapse related to an occult obstructing central airway lesion so recommend follow-up chest CT after appropriate antibiotic therapy to ensure resolution. 2. Patchy, although fairly large, ground-glass density consolidations within the right lower lobe. This is also highly suspicious for an atypical pneumonia such as fungal or viral. Given the additional small nodular consolidations within each lung, and tree-in-bud pattern, other considerations including MAI and hypersensitivity pneumonitis. Tuberculosis is less likely given the lower lobe distribution. 3. No pulmonary embolism seen. 4. Heart size is upper normal.  No pericardial effusion. 5. Coronary artery calcifications, particularly dense within the left anterior descending coronary artery. Recommend correlation with any possible associated cardiac symptoms. 6. Emphysematous changes bilaterally, mild to moderate in degree. 7. Surgical changes about the right hilum compatible with given history of a previous partial lung resection for lung cancer. Electronically Signed: By: SFranki CabotM.D. On: 10/02/2015 14:18     ASSESSMENT / PLAN:  ?CAP  Abnormal CT - atypical PNA (viral v fungal) v MAI v pneumonitis with ongoing XRT.  Illness seems entirely acute and she is responding quickly to antibiotics.  Hx lung ca - adenocarcinoma s/p RULobectomy and XRT.  Doubt this is recurrence of cancer as nodules and LUL dense consolidation stable since last scan.  She was in her usual state of health and doing well until this acute febrile illness.    REC -  Supplemental O2 if needed  Cont IV abx - ceftriaxone, doxy per ID  Airborne precautions and quant. Gold cancelled per ID Flu PCR, RVP  F/u CXR  Consider FOB to r/o M Avium - hold off for now as seems clinically much improved.   Check pct  Sputum culture if able  Urine strep, legionella  F/u CXR   For now would cont abx, f/u cultures/flu and monitor clinically.  Pulmonary will f/u as outpt for ongoing needs.    KNickolas Madrid NP 10/04/2015  9:20 AM Pager: [phone] or [phone]

## 2015-10-04 NOTE — Progress Notes (Signed)
Patient continues to have very frequent urinary incontinence requiring frequent linen changes. Pt states " I just can't stop peeing".  Will continue to monitor patient.

## 2015-10-04 NOTE — Progress Notes (Signed)
[facility] PROGRESS NOTE  Valerie Young PJA:[id number] DOB: 02-Dec-1928 DOA: 10/02/2015 PCP: No PCP Per Patient  Assessment/Plan:  Community acquired pneumonia Patient presenting with community acquired pneumonia, patient started on  IV Rocephin and Zithromax. Obtained urine for Legionella antigen as well as urinary strep pneumonia antigen, which are negative. . Obtain blood cultures 2. Follow culture results  Flu PCR is negative.  Antibiotics changed per ID to ceftriaxone and doxycycline.  MAI versus hypersensitivity pneumonitis CT chest shows groundglass opacity in the right lower lobe. Considerations including MAI versus hyper since today pneumonitis, fungal versus viral pneumonia, TB less likely. Gold quantiferon test was ordered on admission,w hich was later discontinued by ID.   ID was consulted, and recommendaions given.   Adenocarcinoma of the lung- patient has history of adenocarcinoma of lung status post resection of lung and radiation treatment per Dr. Tammi Klippel. Recent CT in October was concerning for potential recurrence of adenocarcinoma but at that time treatment was not pursued given no symptoms and stability of lesions.  Outpatient follow up with Dr Tammi Klippel.  Called radiation oncology and dr Tammi Klippel not in the office today.   Hypothyroidism Continue Synthroid  Hypokalemia With his potassium and check BMP in a.m.  Hypertension Continue atenolol  Hyperlipidemia Continue Pravachol  Acute renal failure; probablyf rom dehydration, improving with fluids.   Leukocytosis: Follow  Procalcitonin.    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 lung cancer, status post partial resection of lung, today presents to the hospital after patient fell in her apartment and was unable to get up for 4 hours. Patient says that she has been sick for past 1 week, she has been coughing up yellow-colored phlegm. Has been getting more short of breath on walking. EMS was called and brought the patient to the hospital. Patient was found to have elevated d-dimer 3.04, CT angiogram was done which was negative for pulmonary embolism. Showed dense consolidation within the left upper lobe representing pneumonia, also showed patchy groundglass density consolidation within the right lower lobe highly suspicious for atypical pneumonia.   Scattered wheezing.   Objective: Filed Vitals:   10/04/15 1434  BP: 147/75  Pulse: 86  Temp: 99.1 F (37.3 C)  Resp: 18    Intake/Output Summary (Last 24 hours) at 10/04/15 1756 Last data filed at 10/04/15 0700  Gross per 24 hour  Intake   1920 ml  Output      0 ml  Net   1920 ml   Filed Weights   10/02/15 1635  Weight: 56.4 kg (124 lb 5.4 oz)    Exam:   General:  Appears in no acute distress  Cardiovascular: S1-S2 regular  Respiratory: Clear to auscultation bilaterally  Abdomen: Soft, nontender, no organomegaly  Musculoskeletal: No cyanosis/clubbing/edema lower extremities  Data Reviewed: Basic Metabolic Panel:  Recent Labs Lab 10/02/15 1147 10/03/15 0525 10/04/15 0517  NA 137 141 141  K 3.3* 4.2 3.9  CL 103 111 109  CO2 19* 22 24  GLUCOSE 215* 168* 102*  BUN 23* 25* 31*  CREATININE 1.55* 1.24* 1.18*  CALCIUM 9.6 9.3 9.2   Liver Function Tests:  Recent Labs Lab 10/03/15  0525  AST 31  ALT 22  ALKPHOS 76  BILITOT 0.6  PROT 7.0  ALBUMIN 3.3*   CBC:  Recent Labs Lab 10/02/15 1147 10/03/15 0525 10/04/15 0517  WBC 13.5* 16.5* 20.0*  HGB 12.9 12.5 12.6  HCT 39.9 38.7 39.3  MCV 98.8 98.7 98.7  PLT  212 191 216   Cardiac Enzymes:  Recent Labs Lab 10/02/15 1147  TROPONINI <0.03    CBG: No results for input(s): GLUCAP in the last 168 hours.  Recent Results (from the past 240 hour(s))  Culture, blood (routine x 2) Call MD if unable to obtain prior to antibiotics being given     Status: None (Preliminary result)   Collection Time: 10/02/15  5:15 PM  Result Value Ref Range Status   Specimen Description BLOOD RIGHT ARM  Final   Special Requests BOTTLES DRAWN AEROBIC AND ANAEROBIC  5CC  Final   Culture   Final    NO GROWTH 2 DAYS Performed at [facility]    Report Status PENDING  Incomplete  Culture, blood (routine x 2) Call MD if unable to obtain prior to antibiotics being given     Status: None (Preliminary result)   Collection Time: 10/02/15  5:20 PM  Result Value Ref Range Status   Specimen Description BLOOD RIGHT WRIST  Final   Special Requests BOTTLES DRAWN AEROBIC AND ANAEROBIC  10CC  Final   Culture   Final    NO GROWTH 2 DAYS Performed at [facility]    Report Status PENDING  Incomplete     Studies: No results found.  Scheduled Meds: . atenolol  50 mg Oral q morning - 10a  . cefTRIAXone (ROCEPHIN)  IV  1 g Intravenous Q24H  . doxycycline  100 mg Oral Q12H  . enoxaparin (LOVENOX) injection  30 mg Subcutaneous Q24H  . guaiFENesin  1,200 mg Oral BID  . levothyroxine  50 mcg Oral QAC breakfast  . pravastatin  40 mg Oral q1800   Continuous Infusions: . sodium chloride 75 mL/hr at 10/03/15 2225    Active Problems:   CAP (community acquired pneumonia)   Lung cancer ([place])   Hypothyroidism   Adenocarcinoma, lung ([place])   Atypical pneumonia    Time spent: 25 min    [facility] Pager [phone] . If 7PM-7AM, please contact night-coverage at [email or web address], password [facility] 10/04/2015, 5:56 PM  LOS: 2 days

## 2015-10-04 NOTE — Evaluation (Signed)
Physical Therapy Evaluation Patient Details Name: Valerie Young MRN: [id number] DOB: 1929-05-08 Today's Date: 10/04/2015   History of Present Illness  81yo female with hx HTN, COPD, hx PE/DVT, lung ca s/p RULobectomy and LUL XRT with previous radiation pneumonitis and admitted for CAP.  Clinical Impression  Pt admitted with above diagnosis. Pt currently with functional limitations due to the deficits listed below (see PT Problem List).  Pt will benefit from skilled PT to increase their independence and safety with mobility to allow discharge to the venue listed below.   Pt tolerated short distance ambulation in hallway.  Pt states she has SPC and RW at home to use if needed.     Follow Up Recommendations Home health PT (pending progress)    Equipment Recommendations  None recommended by PT    Recommendations for Other Services       Precautions / Restrictions Precautions Precautions: Fall Precaution Comments: monitor sats, urinary incontinence      Mobility  Bed Mobility Overal bed mobility: Needs Assistance Bed Mobility: Supine to Sit;Sit to Supine     Supine to sit: Supervision Sit to supine: Supervision   General bed mobility comments: increased time required, assisted with pads/mesh undergarment  Transfers Overall transfer level: Needs assistance Equipment used: Rolling walker (2 wheeled) Transfers: Sit to/from Stand Sit to Stand: Min assist         General transfer comment: assist to steady  Ambulation/Gait Ambulation/Gait assistance: Min guard Ambulation Distance (Feet): 60 Feet Assistive device: Rolling walker (2 wheeled)       General Gait Details: verbal cues for safe use of RW, mild SOB upon return to room (pt believes from wearing mask - droplet precautions), SpO2 97% during ambulation room air, distance limited by incontinence so recliner brought for pt  Stairs            Wheelchair Mobility    Modified Rankin (Stroke Patients Only)       Balance                                             Pertinent Vitals/Pain Pain Assessment: No/denies pain    Home Living Family/patient expects to be discharged to:: Private residence Living Arrangements: Alone   Type of Home: House Home Access: Level entry     Home Layout: One level Home Equipment: Environmental consultant - 2 wheels;Cane - single point Additional Comments: has a Yorkie    Prior Function Level of Independence: Independent               Journalist, newspaper        Extremity/Trunk Assessment               Lower Extremity Assessment: Generalized weakness         Communication   Communication: No difficulties  Cognition Arousal/Alertness: Awake/alert Behavior During Therapy: WFL for tasks assessed/performed Overall Cognitive Status: Within Functional Limits for tasks assessed                      General Comments      Exercises        Assessment/Plan    PT Assessment Patient needs continued PT services  PT Diagnosis Difficulty walking   PT Problem List Decreased strength;Decreased activity tolerance;Decreased mobility  PT Treatment Interventions DME instruction;Gait training;Patient/family education;Functional mobility training;Therapeutic activities;Therapeutic exercise   PT Goals (Current  goals can be found in the Care Plan section) Acute Rehab PT Goals PT Goal Formulation: With patient Time For Goal Achievement: 10/11/15 Potential to Achieve Goals: Good    Frequency Min 3X/week   Barriers to discharge        Co-evaluation               End of Session Equipment Utilized During Treatment: Gait belt Activity Tolerance: Patient tolerated treatment well Patient left: in bed;with call bell/phone within reach;with bed alarm set Nurse Communication: Mobility status         Time: 9810-2548 PT Time Calculation (min) (ACUTE ONLY): 30 min   Charges:   PT Evaluation $Initial PT Evaluation Tier I: 1  Procedure     PT G Codes:        Demarqus Jocson,KATHrine E 10/04/2015, 1:05 PM Carmelia Bake, PT, DPT 10/04/2015 Pager: [phone]

## 2015-10-05 DIAGNOSIS — E039 Hypothyroidism, unspecified: Secondary | ICD-10-CM

## 2015-10-05 LAB — BASIC METABOLIC PANEL
Anion gap: 8 (ref 5–15)
BUN: 22 mg/dL — ABNORMAL HIGH (ref 6–20)
CO2: 24 mmol/L (ref 22–32)
Calcium: 9.1 mg/dL (ref 8.9–10.3)
Chloride: 108 mmol/L (ref 101–111)
Creatinine, Ser: 1.19 mg/dL — ABNORMAL HIGH (ref 0.44–1.00)
GFR calc Af Amer: 47 mL/min — ABNORMAL LOW (ref >60)
GFR, EST NON AFRICAN AMERICAN: 41 mL/min — AB (ref >60)
GLUCOSE: 99 mg/dL (ref 65–99)
POTASSIUM: 3.3 mmol/L — AB (ref 3.5–5.1)
Sodium: 140 mmol/L (ref 135–145)

## 2015-10-05 LAB — QUANTIFERON IN TUBE
QFT TB AG MINUS NIL VALUE: 0 IU/mL
QUANTIFERON MITOGEN VALUE: 0.02 IU/mL
QUANTIFERON NIL VALUE: 0.01 [IU]/mL
QUANTIFERON TB AG VALUE: 0.01 [IU]/mL
QUANTIFERON TB GOLD: UNDETERMINED

## 2015-10-05 LAB — LEGIONELLA PNEUMOPHILA SEROGP 1 UR AG: L. PNEUMOPHILA SEROGP 1 UR AG: NEGATIVE

## 2015-10-05 LAB — QUANTIFERON TB GOLD ASSAY (BLOOD)

## 2015-10-05 MED ORDER — ZOLPIDEM TARTRATE 5 MG PO TABS
5.0000 mg | ORAL_TABLET | Freq: Once | ORAL | Status: DC
  Filled 2015-10-05: qty 1

## 2015-10-05 MED ORDER — POTASSIUM CHLORIDE CRYS ER 20 MEQ PO TBCR
40.0000 meq | EXTENDED_RELEASE_TABLET | Freq: Once | ORAL | Status: AC
  Administered 2015-10-05: 40 meq via ORAL
  Filled 2015-10-05: qty 2

## 2015-10-05 NOTE — Care Management Important Message (Signed)
Important Message  Patient Details IM Letter given to Cookie/Case Manager to present to [place] Message  Patient Details  Name: Valerie Young MRN: [id number] Date of Birth: 12-21-1928   Medicare Important Message Given:  Yes    Camillo Flaming 10/05/2015, 12:16 PM Name: Valerie Young MRN: [id number] Date of Birth: December 24, 1928   Medicare Important Message Given:  Yes    Camillo Flaming 10/05/2015, 12:15 PM

## 2015-10-05 NOTE — Progress Notes (Signed)
[facility] PROGRESS NOTE  Valerie Young GGY:[id number] DOB: July 24, 1929 DOA: 10/02/2015 PCP: No PCP Per Patient  Assessment/Plan:  Community acquired pneumonia Patient presenting with community acquired pneumonia, patient started on  IV Rocephin and Zithromax. Obtained urine for Legionella antigen as well as urinary strep pneumonia antigen, which are negative. . Obtain blood cultures 2. Follow culture results  Flu PCR is negative.  Antibiotics changed per ID to ceftriaxone and doxycycline. Her symptoms ae improving.   MAI versus hypersensitivity pneumonitis CT chest shows groundglass opacity in the right lower lobe. Considerations including MAI versus hyper since today pneumonitis, fungal versus viral pneumonia, TB less likely. Gold quantiferon test was ordered on admission,w hich was later discontinued by ID.   ID was consulted, and recommendaions given.    Adenocarcinoma of the lung- patient has history of adenocarcinoma of lung status post resection of lung and radiation treatment per Dr. Tammi Klippel. Recent CT in October was concerning for potential recurrence of adenocarcinoma but at that time treatment was not pursued given no symptoms and stability of lesions.  Outpatient follow up with Dr Tammi Klippel.   Discussed with Dr Tammi Klippel, recommended outpatient follow up in 2 months with a repeat CT.  Discussed witht he patient and her mother.   Hypothyroidism Continue Synthroid  Hypokalemia Replete as needed adn repeat in am.   Hypertension Continue atenolol  Hyperlipidemia Continue Pravachol  Acute renal failure; probablyf rom dehydration, improving with fluids.   Leukocytosis: Follow  Procalcitonin.    DVT prophylaxis Lovenox  Code Status: DO NOT RESUSCITATE Family Communication: Discussed with patient's daughter at bedside Disposition Plan: plan for D/C IN AM.    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 lung cancer, status post partial resection of lung, today presents to the hospital after patient fell in her apartment and was unable to get up for 4 hours. Patient says that she has been sick for past 1 week, she has been coughing up yellow-colored phlegm. Has been getting more short of breath on walking. EMS was called and brought the patient to the hospital. Patient was found to have elevated d-dimer 3.04, CT angiogram was done which was negative for pulmonary embolism. Showed dense consolidation within the left upper lobe representing pneumonia, also showed patchy groundglass density consolidation within the right lower lobe highly suspicious for atypical pneumonia.   NO WHEEZING TODAY.   Objective: Filed Vitals:   10/05/15 1622  BP: 163/56  Pulse:   Temp:   Resp:     Intake/Output Summary (Last 24 hours) at 10/05/15 1754 Last data filed at 10/05/15 0900  Gross per 24 hour  Intake   2015 ml  Output    100 ml  Net   1915 ml   Filed Weights   10/02/15 1635  Weight: 56.4 kg (124 lb 5.4 oz)    Exam:   General:  Appears in no acute distress  Cardiovascular: S1-S2 regular  Respiratory: Clear to auscultation bilaterally  Abdomen: Soft, nontender, no organomegaly  Musculoskeletal: No cyanosis/clubbing/edema lower extremities  Data Reviewed: Basic Metabolic Panel:  Recent Labs Lab 10/02/15 1147 10/03/15 0525 10/04/15 0517 10/05/15 0527  NA 137 141 141 140  K 3.3* 4.2 3.9 3.3*  CL 103 111 109 108  CO2 19* '22 24 24  '$ GLUCOSE 215* 168* 102* 99  BUN 23* 25* 31*  22*  CREATININE 1.55* 1.24* 1.18* 1.19*  CALCIUM 9.6 9.3 9.2 9.1   Liver Function Tests:  Recent Labs Lab 10/03/15 0525  AST 31  ALT 22  ALKPHOS 76  BILITOT 0.6  PROT 7.0  ALBUMIN 3.3*   CBC:  Recent Labs Lab 10/02/15 1147 10/03/15 0525 10/04/15 0517  WBC  13.5* 16.5* 20.0*  HGB 12.9 12.5 12.6  HCT 39.9 38.7 39.3  MCV 98.8 98.7 98.7  PLT 212 191 216   Cardiac Enzymes:  Recent Labs Lab 10/02/15 1147  TROPONINI <0.03    CBG: No results for input(s): GLUCAP in the last 168 hours.  Recent Results (from the past 240 hour(s))  Culture, blood (routine x 2) Call MD if unable to obtain prior to antibiotics being given     Status: None (Preliminary result)   Collection Time: 10/02/15  5:15 PM  Result Value Ref Range Status   Specimen Description BLOOD RIGHT ARM  Final   Special Requests BOTTLES DRAWN AEROBIC AND ANAEROBIC  5CC  Final   Culture   Final    NO GROWTH 3 DAYS Performed at [facility]    Report Status PENDING  Incomplete  Culture, blood (routine x 2) Call MD if unable to obtain prior to antibiotics being given     Status: None (Preliminary result)   Collection Time: 10/02/15  5:20 PM  Result Value Ref Range Status   Specimen Description BLOOD RIGHT WRIST  Final   Special Requests BOTTLES DRAWN AEROBIC AND ANAEROBIC  10CC  Final   Culture   Final    NO GROWTH 3 DAYS Performed at [facility]    Report Status PENDING  Incomplete     Studies: No results found.  Scheduled Meds: . atenolol  50 mg Oral q morning - 10a  . cefTRIAXone (ROCEPHIN)  IV  1 g Intravenous Q24H  . doxycycline  100 mg Oral Q12H  . enoxaparin (LOVENOX) injection  30 mg Subcutaneous Q24H  . guaiFENesin  1,200 mg Oral BID  . levothyroxine  50 mcg Oral QAC breakfast  . pravastatin  40 mg Oral q1800   Continuous Infusions: . sodium chloride 75 mL/hr at 10/05/15 0549    Active Problems:   CAP (community acquired pneumonia)   Lung cancer ([place])   Hypothyroidism   Adenocarcinoma, lung ([place])   Atypical pneumonia    Time spent: 25 min    [place] [facility] Pager [phone] . If 7PM-7AM, please contact night-coverage at [email or web address], password [facility] 10/05/2015, 5:54 PM  LOS: 3 days

## 2015-10-05 NOTE — Progress Notes (Signed)
CSW provided SCAT application to pt daughter. Pt daughter appreciative of information. Pt daughter will notify CSW if she has any questions regarding application.   Alison Murray, MSW, [facility] [phone]

## 2015-10-06 LAB — BASIC METABOLIC PANEL
Anion gap: 8 (ref 5–15)
BUN: 16 mg/dL (ref 6–20)
CO2: 21 mmol/L — ABNORMAL LOW (ref 22–32)
Calcium: 8.9 mg/dL (ref 8.9–10.3)
Chloride: 107 mmol/L (ref 101–111)
Creatinine, Ser: 0.99 mg/dL (ref 0.44–1.00)
GFR calc Af Amer: 59 mL/min — ABNORMAL LOW (ref >60)
GFR calc non Af Amer: 51 mL/min — ABNORMAL LOW (ref >60)
Glucose, Bld: 90 mg/dL (ref 65–99)
Potassium: 3.9 mmol/L (ref 3.5–5.1)
Sodium: 136 mmol/L (ref 135–145)

## 2015-10-06 LAB — CBC
HCT: 37.7 % (ref 36.0–46.0)
Hemoglobin: 12.4 g/dL (ref 12.0–15.0)
MCH: 32.2 pg (ref 26.0–34.0)
MCHC: 32.9 g/dL (ref 30.0–36.0)
MCV: 97.9 fL (ref 78.0–100.0)
Platelets: 204 10*3/uL (ref 150–400)
RBC: 3.85 MIL/uL — ABNORMAL LOW (ref 3.87–5.11)
RDW: 13.3 % (ref 11.5–15.5)
WBC: 14.4 10*3/uL — ABNORMAL HIGH (ref 4.0–10.5)

## 2015-10-06 LAB — RESPIRATORY VIRUS PANEL
Adenovirus: NEGATIVE
Influenza A: NEGATIVE
Influenza B: NEGATIVE
METAPNEUMOVIRUS: NEGATIVE
PARAINFLUENZA 2 A: NEGATIVE
PARAINFLUENZA 3 A: NEGATIVE
Parainfluenza 1: NEGATIVE
RESPIRATORY SYNCYTIAL VIRUS A: NEGATIVE
Respiratory Syncytial Virus B: NEGATIVE
Rhinovirus: NEGATIVE

## 2015-10-06 LAB — PROCALCITONIN: Procalcitonin: 0.21 ng/mL

## 2015-10-06 MED ORDER — LEVOFLOXACIN 750 MG PO TABS: 750.0000 mg | ORAL_TABLET | Freq: Every day | ORAL | Status: DC

## 2015-10-06 MED ORDER — GUAIFENESIN ER 600 MG PO TB12: 1200.0000 mg | ORAL_TABLET | Freq: Two times a day (BID) | ORAL | Status: DC

## 2015-10-06 MED ORDER — ENOXAPARIN SODIUM 40 MG/0.4ML ~~LOC~~ SOLN: 40.0000 mg | SUBCUTANEOUS | Status: DC

## 2015-10-06 NOTE — Progress Notes (Signed)
Physical Therapy Treatment Patient Details Name: Valerie Young MRN: [id number] DOB: [birth year] Today's Date: 10/12/2015    History of Present Illness 81yo female with hx HTN, COPD, hx PE/DVT, lung ca s/p RULobectomy and LUL XRT with previous radiation pneumonitis and admitted for CAP.    PT Comments    Pt tolerated ambulating in hallway well today and anticipates d/c home today.  Follow Up Recommendations  Home health PT     Equipment Recommendations  None recommended by PT    Recommendations for Other Services       Precautions / Restrictions Precautions Precautions: Fall Precaution Comments: monitor sats, urinary incontinence Restrictions Weight Bearing Restrictions: No    Mobility  Bed Mobility Overal bed mobility: Needs Assistance Bed Mobility: Supine to Sit;Sit to Supine     Supine to sit: Supervision Sit to supine: Supervision   General bed mobility comments: increased time required, assisted with pads/mesh undergarment  Transfers Overall transfer level: Needs assistance Equipment used: Rolling walker (2 wheeled) Transfers: Sit to/from [facility] Sit to Stand: Min guard Stand pivot transfers: Min guard       General transfer comment: min/guard for safety  Ambulation/Gait Ambulation/Gait assistance: Min guard Ambulation Distance (Feet): 80 Feet Assistive device: Rolling walker (2 wheeled) Gait Pattern/deviations: Step-through pattern;Decreased stride length     General Gait Details: verbal cues for safe use of RW, slow but steady gait with RW   Stairs            Wheelchair Mobility    Modified Rankin (Stroke Patients Only)       Balance                                    Cognition Arousal/Alertness: Awake/alert Behavior During Therapy: WFL for tasks assessed/performed Overall Cognitive Status: Within Functional Limits for tasks assessed                      Exercises      General  Comments        Pertinent Vitals/Pain Pain Assessment: No/denies pain    Home Living                      Prior Function            PT Goals (current goals can now be found in the care plan section) Progress towards PT goals: Progressing toward goals    Frequency  Min 3X/week    PT Plan Current plan remains appropriate    Co-evaluation             End of Session   Activity Tolerance: Patient tolerated treatment well Patient left: in bed;with call bell/phone within reach;with bed alarm set     Time: 2831-5176 PT Time Calculation (min) (ACUTE ONLY): 17 min  Charges:  $Gait Training: 8-22 mins                    G Codes:      Raymondo Garcialopez,KATHrine E 2015/10/12, 12:50 PM Carmelia Bake, PT, DPT October 12, 2015 Pager: [phone]

## 2015-10-07 DIAGNOSIS — J449 Chronic obstructive pulmonary disease, unspecified: Secondary | ICD-10-CM | POA: Diagnosis not present

## 2015-10-07 DIAGNOSIS — C3411 Malignant neoplasm of upper lobe, right bronchus or lung: Secondary | ICD-10-CM | POA: Diagnosis not present

## 2015-10-07 DIAGNOSIS — Z8701 Personal history of pneumonia (recurrent): Secondary | ICD-10-CM | POA: Diagnosis not present

## 2015-10-07 DIAGNOSIS — Z86718 Personal history of other venous thrombosis and embolism: Secondary | ICD-10-CM | POA: Diagnosis not present

## 2015-10-07 DIAGNOSIS — Z9181 History of falling: Secondary | ICD-10-CM | POA: Diagnosis not present

## 2015-10-07 DIAGNOSIS — C3412 Malignant neoplasm of upper lobe, left bronchus or lung: Secondary | ICD-10-CM | POA: Diagnosis not present

## 2015-10-07 DIAGNOSIS — Z86711 Personal history of pulmonary embolism: Secondary | ICD-10-CM | POA: Diagnosis not present

## 2015-10-07 DIAGNOSIS — I10 Essential (primary) hypertension: Secondary | ICD-10-CM | POA: Diagnosis not present

## 2015-10-07 DIAGNOSIS — E039 Hypothyroidism, unspecified: Secondary | ICD-10-CM | POA: Diagnosis not present

## 2015-10-07 LAB — CULTURE, BLOOD (ROUTINE X 2)
CULTURE: NO GROWTH
CULTURE: NO GROWTH

## 2015-10-08 DIAGNOSIS — C3412 Malignant neoplasm of upper lobe, left bronchus or lung: Secondary | ICD-10-CM | POA: Diagnosis not present

## 2015-10-08 DIAGNOSIS — I10 Essential (primary) hypertension: Secondary | ICD-10-CM | POA: Diagnosis not present

## 2015-10-08 DIAGNOSIS — Z9181 History of falling: Secondary | ICD-10-CM | POA: Diagnosis not present

## 2015-10-08 DIAGNOSIS — C3411 Malignant neoplasm of upper lobe, right bronchus or lung: Secondary | ICD-10-CM | POA: Diagnosis not present

## 2015-10-08 DIAGNOSIS — J449 Chronic obstructive pulmonary disease, unspecified: Secondary | ICD-10-CM | POA: Diagnosis not present

## 2015-10-08 DIAGNOSIS — E039 Hypothyroidism, unspecified: Secondary | ICD-10-CM | POA: Diagnosis not present

## 2015-10-09 ENCOUNTER — Encounter: Payer: Self-pay | Admitting: Radiation Oncology

## 2015-10-09 NOTE — Discharge Summary (Addendum)
Physician Discharge Summary  Valerie Young ZSW:[id number] DOB: 1929/06/08 DOA: 10/02/2015  PCP: No PCP Per Patient  Admit date: 10/02/2015 Discharge date: 10/06/2015  Time spent: 30 minutes  Recommendations for Outpatient Follow-up:  1. Follow up with PCP in one week.  2. Follow up with Dr Tammi Klippel as recommended.    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 lung cancer, status post partial resection of lung, presents with sob.   Hospital Course:  Community acquired pneumonia Patient presenting with community acquired pneumonia, patient started on IV Rocephin and Zithromax. Obtained urine for Legionella antigen as well as urinary strep pneumonia antigen, which are negative. . negative blood cultures 2.  Flu PCR is negative.  Antibiotics changed per ID to ceftriaxone and doxycycline and to levaquin on discharge. Her symptoms have improved. Weaned her off oxygen.   MAI versus hypersensitivity pneumonitis CT chest shows groundglass opacity in the right lower lobe. Considerations including MAI versus hyper since today pneumonitis, fungal versus viral pneumonia, TB less likely. Gold quantiferon test was ordered on admission, which was later discontinued by ID. ID was consulted, and recommendaions given to treat this as community acquired pneumonia.    Adenocarcinoma of the lung- patient has history of adenocarcinoma of lung status post resection of lung and radiation treatment per Dr. Tammi Klippel. Recent CT in October was concerning for potential recurrence of adenocarcinoma but at  that time treatment was not pursued given no symptoms and stability of lesions.  Outpatient follow up with Dr Tammi Klippel.  Discussed with Dr Tammi Klippel, recommended outpatient follow up in 2 months with a repeat CT.  Discussed witht he patient and her mother.   Hypothyroidism Continue Synthroid  Hypokalemia RepleteD  Hypertension Continue atenolol  Hyperlipidemia Continue Pravachol  Acute renal failure; probably from dehydration, improvedwith fluids.   Leukocytosis: Improving .   Procedures:    Consultations:  PCCM.   Discharge Exam: Filed Vitals:   10/06/15 0556  BP: 147/61  Pulse: 69  Temp: 98.3 F (36.8 C)  Resp:     General: alert afebrile comfortable Cardiovascular: s1s2 Respiratory: ctab  Discharge Instructions   Discharge Instructions    Discharge instructions    Complete by:  As directed   Please follow up with PCP for post hospitalization visit in one week.          Discharge Medication List as of 10/06/2015  1:42 PM    START taking these medications   Details  guaiFENesin (MUCINEX) 600 MG 12 hr tablet Take 2 tablets (1,200 mg total) by mouth 2 (two) times daily., Starting 10/06/2015, Until Discontinued, Print    levofloxacin (LEVAQUIN) 750 MG tablet Take 1 tablet (750 mg total) by mouth daily., Starting 10/06/2015, Until Discontinued, Print      CONTINUE these medications which have NOT CHANGED   Details  acetaminophen-codeine (TYLENOL #3) 300-30 MG tablet Take 1 tablet by mouth every 4 (four) hours as needed for moderate pain or severe pain., Until Discontinued, Historical Med    atenolol (TENORMIN) 50 MG tablet Take 50 mg by mouth every morning., Starting 07/07/2015, Until  Discontinued, Historical Med    cetirizine (ZYRTEC) 10 MG tablet Take 10 mg by mouth daily as needed for allergies. , Starting 09/10/2015, Until Discontinued, Historical Med    levothyroxine (SYNTHROID, LEVOTHROID) 50 MCG tablet Take 50 mcg by mouth daily., Starting  09/12/2015, Until Discontinued, Historical Med    pravastatin (PRAVACHOL) 40 MG tablet Take 40 mg by mouth daily., Starting 07/16/2015, Until Discontinued, Historical Med       Allergies  Allergen Reactions  . Other Other (See Comments)    Pt states she is allergic to a medication and can not recall what it is    Follow-up Information    Follow up with PARRETT,TAMMY, NP On 11/02/2015.   Specialty:  Pulmonary Disease   Why:  3 pm   Contact information:   [address] [place] [address] [phone]       Follow up with Vena Austria, MD On 10/11/2015.   Specialty:  Family Medicine   Why:  Appointment at 4:15 PM with PCP, Dr. Alcario Drought information:   [place] [address] [phone]        The results of significant diagnostics from this hospitalization (including imaging, microbiology, ancillary and laboratory) are listed below for reference.    Significant Diagnostic Studies: Dg Chest 2 View  10/02/2015  CLINICAL DATA:  81 year old patient found down from fall yesterday. Initial encounter. Current history of lung cancer status post radiation therapy. EXAM: CHEST  2 VIEW COMPARISON:  Chest CT 08/29/2015 and earlier. FINDINGS: Semi upright AP and lateral views of the chest. Stable large lung volumes. Stable cardiac size and mediastinal contours. Postoperative and post radiation changes to the superior mediastinum bilaterally. No superimposed pneumothorax, pulmonary edema, pleural effusion, or acute pulmonary opacity identified. Stable visualized osseous structures. IMPRESSION: Stable post treatment appearance of the chest. No superimposed acute findings are identified. Electronically Signed   By: Genevie Ann M.D.   On: 10/02/2015 11:51   Ct Angio Chest Pe W/cm &/or Wo Cm  10/03/2015  ADDENDUM REPORT: 10/03/2015 13:12 ADDENDUM: Aspiration pneumonitis would be an additional consideration for the ground-glass consolidations.  Electronically Signed   By: Franki Cabot M.D.   On: 10/03/2015 13:12  10/03/2015  ADDENDUM REPORT: 10/03/2015 12:56 ADDENDUM: Prior studies are now available, including chest CTs dated 08/29/2015, 04/07/2015, and 02/28/2015. The dense consolidation within the left upper lobe is unchanged compared to multiple prior studies, the stability suggesting benignity, described as post treatment consolidation on previous reports. The peripheral ground-glass density consolidations within the posterior aspects of the right lower lobe are new, as are the scattered areas of tree-in-bud within the right upper lobe and left lower lobe. Again, this most likely represents atypical pneumonia such as fungal, viral or MAI. Other considerations listed in the initial impression. Tuberculosis less likely given the distribution. The rapid development argues against bronchoalveolar cell carcinoma or other neoplastic process but would recommend follow-up chest CT to ensure resolution. Please note that the recent chest CT of 08/29/2015 did describe a small (1.4 x 0.6 cm) ground-glass pulmonary nodule within the medial aspects of the right lower lobe possibly, described as concerning for developing low-grade adenocarcinoma. This small ground-glass pulmonary nodule is again seen at the periphery of the new larger ground-glass density consolidations, without significant short-term interval change. These results were discussed with Dr. Drucilla Schmidt on 10/03/2015. Electronically Signed   By: Franki Cabot M.D.   On: 10/03/2015 12:56  10/03/2015  CLINICAL DATA:  Worsening  productive cough for 5 days. Shortness of breath. History of lung cancer. Elevated D-dimer. EXAM: CT ANGIOGRAPHY CHEST WITH CONTRAST TECHNIQUE: Multidetector CT imaging of the chest was performed using the standard protocol during bolus administration of intravenous contrast. Multiplanar CT image reconstructions and MIPs were obtained to evaluate the vascular anatomy. CONTRAST:  72m  OMNIPAQUE IOHEXOL 350 MG/ML SOLN COMPARISON:  None. FINDINGS: There is no pulmonary embolism identified within the main, lobar, or segmental pulmonary arteries bilaterally. Scattered atherosclerotic changes noted along the walls of the normal-caliber thoracic aorta. No aortic aneurysm or dissection. Heart size is upper normal. No pericardial effusion. Coronary artery calcifications noted. There are surgical changes in the right hilum compatible with a previous partial lobectomy. Patchy ground-glass density consolidations are seen within the posterior aspects of the right lower lobe, with some additional small scattered nodular consolidations and mild associated tree-in-bud pattern. Dense consolidation is present within the left upper lobe posteriorly extending upwards towards the left lung apex. Additional small nodular consolidations are present at the left lung base posteriorly. There are scattered emphysematous changes within each lung, mild to moderate in degree. No pleural effusion. No pneumothorax. No enlarged lymph nodes within the mediastinum or axillary regions. Limited images of the upper abdomen are unremarkable. No acute osseous abnormality seen. Review of the MIP images confirms the above findings. IMPRESSION: 1. Dense consolidation within the left upper lobe almost certainly representing pneumonia. Other possibility would be airspace collapse related to an occult obstructing central airway lesion so recommend follow-up chest CT after appropriate antibiotic therapy to ensure resolution. 2. Patchy, although fairly large, ground-glass density consolidations within the right lower lobe. This is also highly suspicious for an atypical pneumonia such as fungal or viral. Given the additional small nodular consolidations within each lung, and tree-in-bud pattern, other considerations including MAI and hypersensitivity pneumonitis. Tuberculosis is less likely given the lower lobe distribution. 3. No pulmonary  embolism seen. 4. Heart size is upper normal.  No pericardial effusion. 5. Coronary artery calcifications, particularly dense within the left anterior descending coronary artery. Recommend correlation with any possible associated cardiac symptoms. 6. Emphysematous changes bilaterally, mild to moderate in degree. 7. Surgical changes about the right hilum compatible with given history of a previous partial lung resection for lung cancer. Electronically Signed: By: SFranki CabotM.D. On: 10/02/2015 14:18    Microbiology: Recent Results (from the past 240 hour(s))  Culture, blood (routine x 2) Call MD if unable to obtain prior to antibiotics being given     Status: None   Collection Time: 10/02/15  5:15 PM  Result Value Ref Range Status   Specimen Description BLOOD RIGHT ARM  Final   Special Requests BOTTLES DRAWN AEROBIC AND ANAEROBIC  5CC  Final   Culture   Final    NO GROWTH 5 DAYS [facility]   Report Status 10/07/2015 FINAL  Final  Culture, blood (routine x 2) Call MD if unable to obtain prior to antibiotics being given     Status: None   Collection Time: 10/02/15  5:20 PM  Result Value Ref Range Status   Specimen Description BLOOD RIGHT WRIST  Final   Special Requests BOTTLES DRAWN AEROBIC AND ANAEROBIC  10CC  Final   Culture   Final    NO GROWTH 5 DAYS [facility]   Report Status 10/07/2015 FINAL  Final  Respiratory virus panel     Status: None   Collection Time: 10/04/15  9:56 AM  Result Value Ref Range Status   Respiratory Syncytial Virus A Negative Negative Final   Respiratory Syncytial Virus B Negative Negative Final   Influenza A Negative Negative Final   Influenza B Negative Negative Final   Parainfluenza 1 Negative Negative Final   Parainfluenza 2 Negative Negative Final   Parainfluenza 3 Negative Negative Final   Metapneumovirus Negative Negative Final   Rhinovirus Negative Negative Final   Adenovirus Negative Negative Final     Comment: (NOTE) Performed At: [facility] [place], [place] [id number] Lindon Romp MD XI:[id number]      Labs: Basic Metabolic Panel:  Recent Labs Lab 10/02/15 1147 10/03/15 0525 10/04/15 0517 10/05/15 0527 10/06/15 0515  NA 137 141 141 140 136  K 3.3* 4.2 3.9 3.3* 3.9  CL 103 111 109 108 107  CO2 19* '22 24 24 '$ 21*  GLUCOSE 215* 168* 102* 99 90  BUN 23* 25* 31* 22* 16  CREATININE 1.55* 1.24* 1.18* 1.19* 0.99  CALCIUM 9.6 9.3 9.2 9.1 8.9   Liver Function Tests:  Recent Labs Lab 10/03/15 0525  AST 31  ALT 22  ALKPHOS 76  BILITOT 0.6  PROT 7.0  ALBUMIN 3.3*   No results for input(s): LIPASE, AMYLASE in the last 168 hours. No results for input(s): AMMONIA in the last 168 hours. CBC:  Recent Labs Lab 10/02/15 1147 10/03/15 0525 10/04/15 0517 10/06/15 0515  WBC 13.5* 16.5* 20.0* 14.4*  HGB 12.9 12.5 12.6 12.4  HCT 39.9 38.7 39.3 37.7  MCV 98.8 98.7 98.7 97.9  PLT 212 191 216 204   Cardiac Enzymes:  Recent Labs Lab 10/02/15 1147  TROPONINI <0.03   BNP: BNP (last 3 results) No results for input(s): BNP in the last 8760 hours.  ProBNP (last 3 results) No results for input(s): PROBNP in the last 8760 hours.  CBG: No results for input(s): GLUCAP in the last 168 hours.     SignedHosie Poisson  [facility] 10/09/2015, 9:47 AM

## 2015-10-10 DIAGNOSIS — J449 Chronic obstructive pulmonary disease, unspecified: Secondary | ICD-10-CM | POA: Diagnosis not present

## 2015-10-10 DIAGNOSIS — E039 Hypothyroidism, unspecified: Secondary | ICD-10-CM | POA: Diagnosis not present

## 2015-10-10 DIAGNOSIS — I10 Essential (primary) hypertension: Secondary | ICD-10-CM | POA: Diagnosis not present

## 2015-10-10 DIAGNOSIS — C3412 Malignant neoplasm of upper lobe, left bronchus or lung: Secondary | ICD-10-CM | POA: Diagnosis not present

## 2015-10-10 DIAGNOSIS — C3411 Malignant neoplasm of upper lobe, right bronchus or lung: Secondary | ICD-10-CM | POA: Diagnosis not present

## 2015-10-10 DIAGNOSIS — Z9181 History of falling: Secondary | ICD-10-CM | POA: Diagnosis not present

## 2015-10-11 DIAGNOSIS — C3412 Malignant neoplasm of upper lobe, left bronchus or lung: Secondary | ICD-10-CM | POA: Diagnosis not present

## 2015-10-11 DIAGNOSIS — Z9181 History of falling: Secondary | ICD-10-CM | POA: Diagnosis not present

## 2015-10-11 DIAGNOSIS — C3411 Malignant neoplasm of upper lobe, right bronchus or lung: Secondary | ICD-10-CM | POA: Diagnosis not present

## 2015-10-11 DIAGNOSIS — E039 Hypothyroidism, unspecified: Secondary | ICD-10-CM | POA: Diagnosis not present

## 2015-10-11 DIAGNOSIS — I10 Essential (primary) hypertension: Secondary | ICD-10-CM | POA: Diagnosis not present

## 2015-10-11 DIAGNOSIS — J449 Chronic obstructive pulmonary disease, unspecified: Secondary | ICD-10-CM | POA: Diagnosis not present

## 2015-10-13 DIAGNOSIS — J449 Chronic obstructive pulmonary disease, unspecified: Secondary | ICD-10-CM | POA: Diagnosis not present

## 2015-10-13 DIAGNOSIS — C3412 Malignant neoplasm of upper lobe, left bronchus or lung: Secondary | ICD-10-CM | POA: Diagnosis not present

## 2015-10-13 DIAGNOSIS — Z9181 History of falling: Secondary | ICD-10-CM | POA: Diagnosis not present

## 2015-10-13 DIAGNOSIS — E039 Hypothyroidism, unspecified: Secondary | ICD-10-CM | POA: Diagnosis not present

## 2015-10-13 DIAGNOSIS — C3411 Malignant neoplasm of upper lobe, right bronchus or lung: Secondary | ICD-10-CM | POA: Diagnosis not present

## 2015-10-13 DIAGNOSIS — I10 Essential (primary) hypertension: Secondary | ICD-10-CM | POA: Diagnosis not present

## 2015-10-14 DIAGNOSIS — Z9181 History of falling: Secondary | ICD-10-CM | POA: Diagnosis not present

## 2015-10-14 DIAGNOSIS — C3412 Malignant neoplasm of upper lobe, left bronchus or lung: Secondary | ICD-10-CM | POA: Diagnosis not present

## 2015-10-14 DIAGNOSIS — C3411 Malignant neoplasm of upper lobe, right bronchus or lung: Secondary | ICD-10-CM | POA: Diagnosis not present

## 2015-10-14 DIAGNOSIS — I10 Essential (primary) hypertension: Secondary | ICD-10-CM | POA: Diagnosis not present

## 2015-10-14 DIAGNOSIS — E039 Hypothyroidism, unspecified: Secondary | ICD-10-CM | POA: Diagnosis not present

## 2015-10-14 DIAGNOSIS — J449 Chronic obstructive pulmonary disease, unspecified: Secondary | ICD-10-CM | POA: Diagnosis not present

## 2015-10-16 DIAGNOSIS — J189 Pneumonia, unspecified organism: Secondary | ICD-10-CM | POA: Diagnosis not present

## 2015-10-16 DIAGNOSIS — J449 Chronic obstructive pulmonary disease, unspecified: Secondary | ICD-10-CM | POA: Diagnosis not present

## 2015-10-16 DIAGNOSIS — C3411 Malignant neoplasm of upper lobe, right bronchus or lung: Secondary | ICD-10-CM | POA: Diagnosis not present

## 2015-10-16 DIAGNOSIS — Z9181 History of falling: Secondary | ICD-10-CM | POA: Diagnosis not present

## 2015-10-16 DIAGNOSIS — E039 Hypothyroidism, unspecified: Secondary | ICD-10-CM | POA: Diagnosis not present

## 2015-10-16 DIAGNOSIS — C3412 Malignant neoplasm of upper lobe, left bronchus or lung: Secondary | ICD-10-CM | POA: Diagnosis not present

## 2015-10-16 DIAGNOSIS — I10 Essential (primary) hypertension: Secondary | ICD-10-CM | POA: Diagnosis not present

## 2015-10-17 DIAGNOSIS — J449 Chronic obstructive pulmonary disease, unspecified: Secondary | ICD-10-CM | POA: Diagnosis not present

## 2015-10-17 DIAGNOSIS — I10 Essential (primary) hypertension: Secondary | ICD-10-CM | POA: Diagnosis not present

## 2015-10-17 DIAGNOSIS — E039 Hypothyroidism, unspecified: Secondary | ICD-10-CM | POA: Diagnosis not present

## 2015-10-17 DIAGNOSIS — Z9181 History of falling: Secondary | ICD-10-CM | POA: Diagnosis not present

## 2015-10-17 DIAGNOSIS — C3411 Malignant neoplasm of upper lobe, right bronchus or lung: Secondary | ICD-10-CM | POA: Diagnosis not present

## 2015-10-17 DIAGNOSIS — C3412 Malignant neoplasm of upper lobe, left bronchus or lung: Secondary | ICD-10-CM | POA: Diagnosis not present

## 2015-10-18 DIAGNOSIS — J449 Chronic obstructive pulmonary disease, unspecified: Secondary | ICD-10-CM | POA: Diagnosis not present

## 2015-10-18 DIAGNOSIS — C3412 Malignant neoplasm of upper lobe, left bronchus or lung: Secondary | ICD-10-CM | POA: Diagnosis not present

## 2015-10-18 DIAGNOSIS — E039 Hypothyroidism, unspecified: Secondary | ICD-10-CM | POA: Diagnosis not present

## 2015-10-18 DIAGNOSIS — C3411 Malignant neoplasm of upper lobe, right bronchus or lung: Secondary | ICD-10-CM | POA: Diagnosis not present

## 2015-10-18 DIAGNOSIS — I10 Essential (primary) hypertension: Secondary | ICD-10-CM | POA: Diagnosis not present

## 2015-10-18 DIAGNOSIS — Z9181 History of falling: Secondary | ICD-10-CM | POA: Diagnosis not present

## 2015-10-23 DIAGNOSIS — I10 Essential (primary) hypertension: Secondary | ICD-10-CM | POA: Diagnosis not present

## 2015-10-23 DIAGNOSIS — C3412 Malignant neoplasm of upper lobe, left bronchus or lung: Secondary | ICD-10-CM | POA: Diagnosis not present

## 2015-10-23 DIAGNOSIS — C3411 Malignant neoplasm of upper lobe, right bronchus or lung: Secondary | ICD-10-CM | POA: Diagnosis not present

## 2015-10-23 DIAGNOSIS — E039 Hypothyroidism, unspecified: Secondary | ICD-10-CM | POA: Diagnosis not present

## 2015-10-23 DIAGNOSIS — J449 Chronic obstructive pulmonary disease, unspecified: Secondary | ICD-10-CM | POA: Diagnosis not present

## 2015-10-23 DIAGNOSIS — Z9181 History of falling: Secondary | ICD-10-CM | POA: Diagnosis not present

## 2015-10-24 DIAGNOSIS — C3412 Malignant neoplasm of upper lobe, left bronchus or lung: Secondary | ICD-10-CM | POA: Diagnosis not present

## 2015-10-24 DIAGNOSIS — E039 Hypothyroidism, unspecified: Secondary | ICD-10-CM | POA: Diagnosis not present

## 2015-10-24 DIAGNOSIS — J449 Chronic obstructive pulmonary disease, unspecified: Secondary | ICD-10-CM | POA: Diagnosis not present

## 2015-10-24 DIAGNOSIS — I10 Essential (primary) hypertension: Secondary | ICD-10-CM | POA: Diagnosis not present

## 2015-10-24 DIAGNOSIS — Z9181 History of falling: Secondary | ICD-10-CM | POA: Diagnosis not present

## 2015-10-24 DIAGNOSIS — C3411 Malignant neoplasm of upper lobe, right bronchus or lung: Secondary | ICD-10-CM | POA: Diagnosis not present

## 2015-10-26 DIAGNOSIS — I10 Essential (primary) hypertension: Secondary | ICD-10-CM | POA: Diagnosis not present

## 2015-10-26 DIAGNOSIS — C3411 Malignant neoplasm of upper lobe, right bronchus or lung: Secondary | ICD-10-CM | POA: Diagnosis not present

## 2015-10-26 DIAGNOSIS — J449 Chronic obstructive pulmonary disease, unspecified: Secondary | ICD-10-CM | POA: Diagnosis not present

## 2015-10-26 DIAGNOSIS — Z9181 History of falling: Secondary | ICD-10-CM | POA: Diagnosis not present

## 2015-10-26 DIAGNOSIS — C3412 Malignant neoplasm of upper lobe, left bronchus or lung: Secondary | ICD-10-CM | POA: Diagnosis not present

## 2015-10-26 DIAGNOSIS — E039 Hypothyroidism, unspecified: Secondary | ICD-10-CM | POA: Diagnosis not present

## 2015-10-27 DIAGNOSIS — C3412 Malignant neoplasm of upper lobe, left bronchus or lung: Secondary | ICD-10-CM | POA: Diagnosis not present

## 2015-10-27 DIAGNOSIS — C3411 Malignant neoplasm of upper lobe, right bronchus or lung: Secondary | ICD-10-CM | POA: Diagnosis not present

## 2015-10-27 DIAGNOSIS — E039 Hypothyroidism, unspecified: Secondary | ICD-10-CM | POA: Diagnosis not present

## 2015-10-27 DIAGNOSIS — Z9181 History of falling: Secondary | ICD-10-CM | POA: Diagnosis not present

## 2015-10-27 DIAGNOSIS — J449 Chronic obstructive pulmonary disease, unspecified: Secondary | ICD-10-CM | POA: Diagnosis not present

## 2015-10-27 DIAGNOSIS — I10 Essential (primary) hypertension: Secondary | ICD-10-CM | POA: Diagnosis not present

## 2015-11-01 ENCOUNTER — Ambulatory Visit: Payer: Medicare Other | Admitting: Pulmonary Disease

## 2015-11-01 ENCOUNTER — Other Ambulatory Visit: Payer: Self-pay | Admitting: *Deleted

## 2015-11-01 ENCOUNTER — Ambulatory Visit (INDEPENDENT_AMBULATORY_CARE_PROVIDER_SITE_OTHER): Payer: Medicare Other | Admitting: Emergency Medicine

## 2015-11-01 ENCOUNTER — Encounter: Payer: Self-pay | Admitting: Emergency Medicine

## 2015-11-01 VITALS — BP 134/84 | HR 57 | Ht 64.0 in | Wt 124.0 lb

## 2015-11-01 DIAGNOSIS — C3490 Malignant neoplasm of unspecified part of unspecified bronchus or lung: Secondary | ICD-10-CM

## 2015-11-01 DIAGNOSIS — J189 Pneumonia, unspecified organism: Secondary | ICD-10-CM

## 2015-11-01 DIAGNOSIS — R911 Solitary pulmonary nodule: Secondary | ICD-10-CM | POA: Diagnosis not present

## 2015-11-01 DIAGNOSIS — J438 Other emphysema: Secondary | ICD-10-CM

## 2015-11-01 MED ORDER — ALBUTEROL SULFATE HFA 108 (90 BASE) MCG/ACT IN AERS: 2.0000 | INHALATION_SPRAY | Freq: Four times a day (QID) | RESPIRATORY_TRACT | Status: DC | PRN

## 2015-11-01 NOTE — Assessment & Plan Note (Signed)
Pulmonary nodule RLL by most recent imaging, will recheck CT 6 months

## 2015-11-01 NOTE — Assessment & Plan Note (Signed)
Asymptomatic at this time although note she was just treated for CAP. She feels better w exception of fatigue. She does not have SABA and we need to get this for her to use prn.

## 2015-11-01 NOTE — Progress Notes (Signed)
Subjective:    Patient ID: Valerie Young, female    DOB: 10-Jan-1929, 81 y.o.   MRN: [id number]  HPI 81 yo woman, former tobacco (52 pk-yrs), hx NSCLCA s/p RUL resection, LUL NSCLCA s/p XRT with curative intent. Has been followed by Dr Gwenette Greet for COPD.  She was admitted last month for a CAP noted on CT chest, was treated with abx and steroids. She felt better at discharge, has done well until 12/6 when she developed fatigue, no cough, no fever. She s not on maintenance BD's, has albuterol to use prn. She does not feel that she needs scheduled BD's when she is at baseline. She denies SOB right now.    Review of Systems As per HPI  Past Medical History  Diagnosis Date  . Hypothyroidism   . COPD (chronic obstructive pulmonary disease) ([place])   . DJD (degenerative joint disease)     knees  . PND (post-nasal drip)     Chronic  . H/O: lung cancer 1982    Right; treated with resection  . Cancer ([place])     right lung ca - 30 years ago / left lung - 11/2012  . 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 Cancer ([place])     Left lung  . Hypertension      Family History  Problem Relation Age of Onset  . CVA Mother 56    hemorrhagic      Social History   Social History  . Marital Status: Divorced    Spouse Name: N/A  . Number of Children: N/A  . Years of Education: N/A   Occupational History  . Retired    Social History Main Topics  . Smoking status: Former Smoker -- 1.00 packs/day for 35 years    Types: Cigarettes    Quit date: 11/25/1980  . Smokeless tobacco: Never Used  . Alcohol Use: No  . Drug Use: No  . Sexual Activity: No   Other Topics Concern  . Not on file   Social History Narrative   ** Merged History Encounter **         Allergies  Allergen Reactions  . Sulfamethoxazole-Trimethoprim     REACTION: rash  . Other Other (See Comments)    Pt states she is allergic to a medication and can not recall what it is      Outpatient Prescriptions Prior  to Visit  Medication Sig Dispense Refill  . acetaminophen-codeine (TYLENOL #3) 300-30 MG tablet Take 1 tablet by mouth every 4 (four) hours as needed for moderate pain or severe pain.    [name] amLODipine (NORVASC) 5 MG tablet TAKE 1 TABLET (5 MG TOTAL) BY MOUTH AT BEDTIME. 90 tablet 0  . atenolol (TENORMIN) 50 MG tablet Take 1 tablet (50 mg total) by mouth daily. 90 tablet 3  . cetirizine (ZYRTEC) 10 MG tablet Take 1 tablet (10 mg total) by mouth at bedtime. 30 tablet 2  . cholecalciferol (VITAMIN D) 1000 UNITS tablet Take 1,000 Units by mouth daily.    [name] levothyroxine (SYNTHROID, LEVOTHROID) 50 MCG tablet Take 50 mcg by mouth daily.  8  . pravastatin (PRAVACHOL) 40 MG tablet Take 40 mg by mouth daily.  3  . guaiFENesin (MUCINEX) 600 MG 12 hr tablet Take 2 tablets (1,200 mg total) by mouth 2 (two) times daily. (Patient not taking: Reported on 11/01/2015) 20 tablet 0  . albuterol (PROVENTIL HFA;VENTOLIN HFA) 108 (90 BASE) MCG/ACT inhaler Inhale 2 puffs into the  lungs every 6 (six) hours as needed for wheezing or shortness of breath.    . meloxicam (MOBIC) 7.5 MG tablet Take 1 tablet (7.5 mg total) by mouth daily. 7 tablet 0  . methocarbamol (ROBAXIN) 500 MG tablet Take 1 tablet (500 mg total) by mouth 4 (four) times daily. 14 tablet 0  . traMADol (ULTRAM) 50 MG tablet Take 1 tablet (50 mg total) by mouth every 6 (six) hours as needed. 9 tablet 0   No facility-administered medications prior to visit.         Objective:   Physical Exam Filed Vitals:   11/01/15 1630  BP: 134/84  Pulse: 57  Height: '5\' 4"'$  (1.626 m)  Weight: 124 lb (56.246 kg)  SpO2: 93%   Gen: Pleasant, thin elderly, in no distress,  normal affect  ENT: No lesions,  mouth clear,  oropharynx clear, no postnasal drip  Neck: No JVD, no TMG, no carotid bruits  Lungs: No use of accessory muscles, clear without rales or rhonchi  Cardiovascular: RRR, heart sounds normal, no murmur or gallops, no peripheral  edema  Musculoskeletal: No deformities, no cyanosis or clubbing  Neuro: alert, non focal  Skin: Warm, no lesions or rashes      Assessment & Plan:  COPD (chronic obstructive pulmonary disease) with emphysema Asymptomatic at this time although note she was just treated for CAP. She feels better w exception of fatigue. She does not have SABA and we need to get this for her to use prn.   CAP (community acquired pneumonia) Recent CAP, note CT scan changes from 10/03/15 LUL and RLL. Clinically improved. We will follow her pulm nodule by repeat CT in 6 months and can also check for resolution infiltrates at that time.   Adenocarcinoma, lung ([place]) Pulmonary nodule RLL by most recent imaging, will recheck CT 6 months

## 2015-11-01 NOTE — Patient Instructions (Addendum)
We will restart your albuterol, 2 puffs if needed for shortness of breath.  We will repeat your CT scan of the chest, no contrast in May 2017 Follow with Dr Lamonte Sakai in May 2017 to review the scan.

## 2015-11-01 NOTE — Assessment & Plan Note (Signed)
Recent CAP, note CT scan changes from 10/03/15 LUL and RLL. Clinically improved. We will follow her pulm nodule by repeat CT in 6 months and can also check for resolution infiltrates at that time.

## 2015-11-02 ENCOUNTER — Inpatient Hospital Stay: Payer: Medicare Other | Admitting: Adult Health

## 2015-11-05 ENCOUNTER — Other Ambulatory Visit: Payer: Self-pay | Admitting: Emergency Medicine

## 2015-12-05 ENCOUNTER — Ambulatory Visit (HOSPITAL_COMMUNITY): Payer: Medicare Other

## 2015-12-06 ENCOUNTER — Telehealth: Payer: Self-pay | Admitting: *Deleted

## 2015-12-06 NOTE — Telephone Encounter (Signed)
CALLED PATIENT TO ASK ABOUT RESCHEDULING THIS PATIENT'S SCAN AND FU, DUE TO MISSING HER SCAN DUE TO THE WEATHER, LVM FOR A RETURN CALL

## 2015-12-07 ENCOUNTER — Ambulatory Visit: Admission: RE | Admit: 2015-12-07 | Payer: Medicare Other | Source: Ambulatory Visit | Admitting: Radiation Oncology

## 2015-12-07 ENCOUNTER — Inpatient Hospital Stay
Admission: RE | Admit: 2015-12-07 | Discharge: 2015-12-07 | Disposition: A | Discharge status: Home / Self Care | Payer: Medicare Other | Source: Ambulatory Visit | Attending: Radiation Oncology | Admitting: Radiation Oncology

## 2016-01-11 DIAGNOSIS — E559 Vitamin D deficiency, unspecified: Secondary | ICD-10-CM | POA: Diagnosis not present

## 2016-01-11 DIAGNOSIS — E782 Mixed hyperlipidemia: Secondary | ICD-10-CM | POA: Diagnosis not present

## 2016-01-11 DIAGNOSIS — J449 Chronic obstructive pulmonary disease, unspecified: Secondary | ICD-10-CM | POA: Diagnosis not present

## 2016-01-11 DIAGNOSIS — E039 Hypothyroidism, unspecified: Secondary | ICD-10-CM | POA: Diagnosis not present

## 2016-01-11 DIAGNOSIS — N183 Chronic kidney disease, stage 3 (moderate): Secondary | ICD-10-CM | POA: Diagnosis not present

## 2016-01-11 DIAGNOSIS — Z23 Encounter for immunization: Secondary | ICD-10-CM | POA: Diagnosis not present

## 2016-01-11 DIAGNOSIS — Z0000 Encounter for general adult medical examination without abnormal findings: Secondary | ICD-10-CM | POA: Diagnosis not present

## 2016-01-11 DIAGNOSIS — I10 Essential (primary) hypertension: Secondary | ICD-10-CM | POA: Diagnosis not present

## 2016-01-11 DIAGNOSIS — J310 Chronic rhinitis: Secondary | ICD-10-CM | POA: Diagnosis not present

## 2016-01-11 DIAGNOSIS — Z1389 Encounter for screening for other disorder: Secondary | ICD-10-CM | POA: Diagnosis not present

## 2016-04-04 ENCOUNTER — Ambulatory Visit (INDEPENDENT_AMBULATORY_CARE_PROVIDER_SITE_OTHER)
Admission: RE | Admit: 2016-04-04 | Discharge: 2016-04-04 | Disposition: A | Discharge status: Home / Self Care | Payer: Medicare Other | Source: Ambulatory Visit | Attending: Emergency Medicine | Admitting: Emergency Medicine

## 2016-04-04 DIAGNOSIS — R911 Solitary pulmonary nodule: Secondary | ICD-10-CM

## 2016-04-04 DIAGNOSIS — R918 Other nonspecific abnormal finding of lung field: Secondary | ICD-10-CM | POA: Diagnosis not present

## 2016-04-09 ENCOUNTER — Encounter: Payer: Self-pay | Admitting: Emergency Medicine

## 2016-04-09 ENCOUNTER — Ambulatory Visit (INDEPENDENT_AMBULATORY_CARE_PROVIDER_SITE_OTHER): Payer: Medicare Other | Admitting: Emergency Medicine

## 2016-04-09 VITALS — BP 126/72 | HR 58 | Ht 64.0 in | Wt 121.0 lb

## 2016-04-09 DIAGNOSIS — C3490 Malignant neoplasm of unspecified part of unspecified bronchus or lung: Secondary | ICD-10-CM

## 2016-04-09 DIAGNOSIS — J438 Other emphysema: Secondary | ICD-10-CM

## 2016-04-09 NOTE — Assessment & Plan Note (Signed)
Stable at this time. We will continue albuterol when necessary. Will not start any scheduled rhonchi dilators at this time.

## 2016-04-09 NOTE — Patient Instructions (Signed)
Please continue to use albuterol 2 puffs up to every 4 hours if needed for shortness of breath We will review your Ct scan with Dr Tammi Klippel and decide whether to repeat the scan in 6 versus 12 months.  Follow with Dr Lamonte Sakai in 6 months or sooner if you have any problems

## 2016-04-09 NOTE — Assessment & Plan Note (Signed)
Repeat CT scan of the chest on 04/04/16 shows some resolution of her right lower lobe groundglass infiltrate although there is a residual rounded abnormality that could be consistent with adenocarcinoma. I believe that this needs to be followed sometime in the next 6-12 months. We will get Dr. Johny Shears input on the timing.

## 2016-04-09 NOTE — Progress Notes (Signed)
Subjective:    Patient ID: Valerie Young, female    DOB: [birth year], 82 y.o.   MRN: [id number]  HPI 82 yo woman, former tobacco (38 pk-yrs), hx NSCLCA s/p RUL resection, LUL NSCLCA s/p XRT with curative intent. Has been followed by Dr Gwenette Greet for COPD.  She was admitted last month for a CAP noted on CT chest, was treated with abx and steroids. She felt better at discharge, has done well until 12/6 when she developed fatigue, no cough, no fever. She s not on maintenance BD's, has albuterol to use prn. She does not feel that she needs scheduled BD's when she is at baseline. She denies SOB right now.   ROV 04/09/16 -- patient follows up for history of possible cell lung cancer bilaterally with treatment as above. She also has history of COPD.  She had a repeat Ct chest 04/04/16 that showed improvement in her RLL GGI although there is still a residual rounded area of GGI in the medial RLL. She states that her breathing is doing well, not limiting her. She has not needed any albuterol.    Review of Systems As per HPI  Past Medical History  Diagnosis Date  . Hypothyroidism   . COPD (chronic obstructive pulmonary disease) ([place])   . DJD (degenerative joint disease)     knees  . PND (post-nasal drip)     Chronic  . H/O: lung cancer 1982    Right; treated with resection  . Cancer ([place])     right lung ca - 30 years ago / left lung - 11/2012  . 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 Cancer ([place])     Left lung  . Hypertension      Family History  Problem Relation Age of Onset  . CVA Mother 74    hemorrhagic      Social History   Social History  . Marital Status: Divorced    Spouse Name: N/A  . Number of Children: N/A  . Years of Education: N/A   Occupational History  . Retired    Social History Main Topics  . Smoking status: Former Smoker -- 1.00 packs/day for 35 years    Types: Cigarettes    Quit date: 11/25/1980  . Smokeless tobacco: Never Used  . Alcohol Use:  No  . Drug Use: No  . Sexual Activity: No   Other Topics Concern  . Not on file   Social History Narrative   ** Merged History Encounter **         Allergies  Allergen Reactions  . Sulfamethoxazole-Trimethoprim     REACTION: rash  . Other Other (See Comments)    Pt states she is allergic to a medication and can not recall what it is      Outpatient Prescriptions Prior to Visit  Medication Sig Dispense Refill  . acetaminophen-codeine (TYLENOL #3) 300-30 MG tablet Take 1 tablet by mouth every 4 (four) hours as needed for moderate pain or severe pain.    [name] albuterol (PROVENTIL HFA;VENTOLIN HFA) 108 (90 BASE) MCG/ACT inhaler Inhale 2 puffs into the lungs every 6 (six) hours as needed for wheezing or shortness of breath. 1 Inhaler 5  . amLODipine (NORVASC) 5 MG tablet TAKE 1 TABLET (5 MG TOTAL) BY MOUTH AT BEDTIME. 90 tablet 0  . atenolol (TENORMIN) 50 MG tablet Take 1 tablet (50 mg total) by mouth daily. 90 tablet 3  . cetirizine (ZYRTEC) 10 MG tablet TAKE 1  TABLET (10 MG TOTAL) BY MOUTH AT BEDTIME. 30 tablet 2  . cholecalciferol (VITAMIN D) 1000 UNITS tablet Take 1,000 Units by mouth daily.    [name] guaiFENesin (MUCINEX) 600 MG 12 hr tablet Take 2 tablets (1,200 mg total) by mouth 2 (two) times daily. 20 tablet 0  . levothyroxine (SYNTHROID, LEVOTHROID) 50 MCG tablet Take 50 mcg by mouth daily.  8  . pravastatin (PRAVACHOL) 40 MG tablet Take 40 mg by mouth daily.  3   No facility-administered medications prior to visit.         Objective:   Physical Exam Filed Vitals:   04/09/16 1429  BP: 126/72  Pulse: 58  Height: '5\' 4"'$  (1.626 m)  Weight: 121 lb (54.885 kg)  SpO2: 93%   Gen: Pleasant, thin elderly, in no distress,  normal affect  ENT: No lesions,  mouth clear,  oropharynx clear, no postnasal drip  Neck: No JVD, no TMG, no carotid bruits  Lungs: No use of accessory muscles, clear without rales or rhonchi  Cardiovascular: RRR, heart sounds normal, no murmur or gallops,  no peripheral edema  Musculoskeletal: No deformities, no cyanosis or clubbing  Neuro: alert, non focal  Skin: Warm, no lesions or rashes  04/04/16 --  COMPARISON: CT chest of 10/02/2015 and 02/16/2014  FINDINGS: Lucency without parenchymal markings within the left lung apex antral laterally may represent a small loculated left hydro pneumothorax. Again consolidation of much of the left upper lobe is noted. Also there are further evolutionary changes of radiation fibrosis along the left medial lung extending anteriorly to posteriorly. A cavitary area in the posterior medial left lower lobe has increased somewhat in size compared to the CT from 10/02/2015, now measure 2.0 x 2.0 cm with thin walls and no fluid within it. Attention to this area on followup CT of the chest is recommended. Mild right apical pleural-parenchymal scarring remains. Changes of right upper lobectomy are again noted. The questioned right suprahilar nodular opacity is unchanged, some of which may be due to a tortuous pulmonary vessel. The 3 mm nodule noted in the right lower lobe on image 79 is less noticeable. A tiny nodule in the right middle lobe described previously now on image 88 is also less noticeable. However, there is a new focus of airspace disease in the posterior medial right lower lobe superior segment, measuring 7 x 15 x 11 mm with part solid component. Previously there was more airspace disease which has completely resolved. This may represent residual of that process, but this area has slightly increased in size, previously measuring 6 x 7 x 11 mm.  On soft tissue window images, the thyroid gland is small and unremarkable. On this unenhanced study, only small mediastinal lymph nodes are present none of which are pathologically enlarged. The main pulmonary artery segment is somewhat prominent suggesting a degree of pulmonary arterial hypertension in this patient with centrilobular  emphysema. Diffuse coronary artery calcifications are again noted. The thoracic vertebrae are in normal alignment.  IMPRESSION: 1. The ground-glass opacities described in the right lower lobe on the prior study have resolved with the exception of a single persisting ground-glass opacity in the posterior medial right lower lobe. This area has increased somewhat in size compared to the most recent CT and was not present on the CT from 2013. A slow growing low-grade adenocarcinoma cannot be excluded. Since this ground-glass opacity has persisted, repeat CT is recommended every 2 years until 5 years of stability has been established. This recommendation  follows the consensus statement: Guidelines for Management of Incidental Pulmonary Nodules Detected on CT Images:From the Fleischner Society 2017; published online before print (10.1148/radiol.1660600459). 2. Lucency in the left lung apex may represent a small loculated left hydro pneumothorax. 3. Further evolutionary changes of radiation fibrosis along the left medial lung. 4. Previously described lung nodules have remained stable. 5. Diffuse coronary artery calcifications.     Assessment & Plan:  COPD (chronic obstructive pulmonary disease) with emphysema Stable at this time. We will continue albuterol when necessary. Will not start any scheduled rhonchi dilators at this time.  Adenocarcinoma, lung ([place]) Repeat CT scan of the chest on 04/04/16 shows some resolution of her right lower lobe groundglass infiltrate although there is a residual rounded abnormality that could be consistent with adenocarcinoma. I believe that this needs to be followed sometime in the next 6-12 months. We will get Dr. Johny Shears input on the timing.

## 2016-04-24 ENCOUNTER — Telehealth: Payer: Self-pay | Admitting: Emergency Medicine

## 2016-04-24 DIAGNOSIS — R911 Solitary pulmonary nodule: Secondary | ICD-10-CM

## 2016-04-24 NOTE — Telephone Encounter (Signed)
Attempted to contact pt. No answer, no option to leave a message. Will try back.  

## 2016-04-25 NOTE — Telephone Encounter (Signed)
Patient Instructions     Please continue to use albuterol 2 puffs up to every 4 hours if needed for shortness of breath We will review your Ct scan with Dr Tammi Klippel and decide whether to repeat the scan in 6 versus 12 months.  Follow with Dr Lamonte Sakai in 6 months or sooner if you have any problems   Spoke with pt. She states that she was instructed to call us about when to do her CT. RB was to discuss this with Dr. Tammi Klippel. Is she to have her CT in 6 month or 12 months?  RB - please advise. Thanks.

## 2016-04-25 NOTE — Telephone Encounter (Signed)
[phone] Mrs Macapagal calling back

## 2016-04-25 NOTE — Telephone Encounter (Signed)
Please let her know that since the pulmonary nodule that we are following was slightly larger on 04/04/16 that we should repeat her CT scan in 6 months (November 2017). If the nodule does not change then we will be able to follow it less frequently. The latest guidelines recommend that "hazy nodules" in the lung should be followed for 5 years to insure that they do not change.

## 2016-04-25 NOTE — Telephone Encounter (Signed)
ATC x2. First time phone was hung up. Second time got VM and it is full. Unable to leave message. WCB.

## 2016-04-25 NOTE — Telephone Encounter (Signed)
Spoke with pt and gave RB's recommendations. Pt voiced understanding. Order placed for future CT and pt aware that someone will call her to schedule. Nothing further needed.

## 2016-06-12 ENCOUNTER — Telehealth: Payer: Self-pay

## 2016-06-12 NOTE — Telephone Encounter (Signed)
Received VM from pt requesting to schedule CT for Nov 2017.  Pt reports she is pt of Dr. Tammi Klippel.  Called pt back and instructed I would forward this information to Dr. Johny Shears RN.

## 2016-06-19 ENCOUNTER — Telehealth: Payer: Self-pay | Admitting: Radiation Oncology

## 2016-06-19 ENCOUNTER — Telehealth: Payer: Self-pay | Admitting: Emergency Medicine

## 2016-06-19 NOTE — Telephone Encounter (Signed)
Received in basket message from Freada Bergeron, Therapist, sports. See below.   Received VM from pt requesting to schedule CT for Nov 2017.  Pt reports she is pt of Dr. Tammi Klippel.  Called pt back and instructed I would forward this information to Dr. Johny Shears RN.    Patient hasn't been seen by Dr. Tammi Klippel since 08/31/15. However, Dr. Agustina Caroli last office note details his intention to order a CT of the patient's chest for November 2017. Thus, phoned Dr. Collene Gobble office and left a message for his nurse that the patient is attempting to reach them to schedule her November chest CT.

## 2016-06-19 NOTE — Telephone Encounter (Signed)
Dr. Johny Shears office is calling and wants to know if they need to order patient's CT or if Dr. Lamonte Sakai is going to order it.    Dr. Lamonte Sakai, please advise.

## 2016-06-20 ENCOUNTER — Ambulatory Visit
Admission: RE | Admit: 2016-06-20 | Discharge: 2016-06-20 | Disposition: A | Discharge status: Home / Self Care | Payer: Medicare Other | Source: Ambulatory Visit | Attending: Family Medicine | Admitting: Family Medicine

## 2016-06-20 ENCOUNTER — Other Ambulatory Visit: Payer: Self-pay | Admitting: Family Medicine

## 2016-06-20 DIAGNOSIS — I824Y1 Acute embolism and thrombosis of unspecified deep veins of right proximal lower extremity: Secondary | ICD-10-CM

## 2016-06-20 DIAGNOSIS — T148XXA Other injury of unspecified body region, initial encounter: Secondary | ICD-10-CM

## 2016-06-20 DIAGNOSIS — R030 Elevated blood-pressure reading, without diagnosis of hypertension: Secondary | ICD-10-CM | POA: Diagnosis not present

## 2016-06-20 DIAGNOSIS — R52 Pain, unspecified: Secondary | ICD-10-CM

## 2016-06-20 DIAGNOSIS — M79661 Pain in right lower leg: Secondary | ICD-10-CM | POA: Diagnosis not present

## 2016-06-22 ENCOUNTER — Other Ambulatory Visit: Payer: Self-pay | Admitting: Physician Assistant

## 2016-06-22 DIAGNOSIS — M79661 Pain in right lower leg: Secondary | ICD-10-CM

## 2016-06-26 NOTE — Telephone Encounter (Signed)
RB please advise. Thanks.  

## 2016-07-01 NOTE — Telephone Encounter (Signed)
Shirley cb to inform Sharyn Lull that she received the vm, nothing further needed

## 2016-07-01 NOTE — Telephone Encounter (Signed)
RB please advise. thanks 

## 2016-07-01 NOTE — Telephone Encounter (Signed)
Left detailed message on nurses voicemail advising her that Dr. Lamonte Sakai will be ordering tomorrow.  Advised her to call our office to confirm receipt of message.

## 2016-07-01 NOTE — Telephone Encounter (Signed)
Please let her know that we will order it tomorrow

## 2016-08-02 DIAGNOSIS — Z23 Encounter for immunization: Secondary | ICD-10-CM | POA: Diagnosis not present

## 2016-10-02 ENCOUNTER — Telehealth: Payer: Self-pay | Admitting: Emergency Medicine

## 2016-10-02 NOTE — Telephone Encounter (Signed)
Called pt, states she did not call our office and did not understand why I was calling her.  I advised her of the notes below and further explained who I was and the nature of my call, and she again stated that I called her and she didn't know why I would be calling her.    Will close encounter.

## 2016-10-21 DIAGNOSIS — M79602 Pain in left arm: Secondary | ICD-10-CM | POA: Diagnosis not present

## 2016-10-25 ENCOUNTER — Ambulatory Visit (HOSPITAL_COMMUNITY): Payer: Medicare Other

## 2016-10-30 ENCOUNTER — Other Ambulatory Visit: Payer: Medicare Other

## 2016-11-06 DIAGNOSIS — G5792 Unspecified mononeuropathy of left lower limb: Secondary | ICD-10-CM | POA: Diagnosis not present

## 2016-11-08 ENCOUNTER — Ambulatory Visit: Payer: Medicare Other | Admitting: Emergency Medicine

## 2016-12-02 ENCOUNTER — Telehealth: Payer: Self-pay | Admitting: Emergency Medicine

## 2016-12-02 ENCOUNTER — Emergency Department (HOSPITAL_COMMUNITY)
Admission: EM | Admit: 2016-12-02 | Discharge: 2016-12-03 | Disposition: A | Discharge status: Home / Self Care | Payer: Medicare Other | Attending: Emergency Medicine | Admitting: Emergency Medicine

## 2016-12-02 ENCOUNTER — Encounter (HOSPITAL_COMMUNITY): Payer: Self-pay

## 2016-12-02 ENCOUNTER — Emergency Department (HOSPITAL_COMMUNITY): Payer: Medicare Other

## 2016-12-02 DIAGNOSIS — Z85118 Personal history of other malignant neoplasm of bronchus and lung: Secondary | ICD-10-CM | POA: Insufficient documentation

## 2016-12-02 DIAGNOSIS — Z87891 Personal history of nicotine dependence: Secondary | ICD-10-CM | POA: Diagnosis not present

## 2016-12-02 DIAGNOSIS — Y999 Unspecified external cause status: Secondary | ICD-10-CM | POA: Insufficient documentation

## 2016-12-02 DIAGNOSIS — I129 Hypertensive chronic kidney disease with stage 1 through stage 4 chronic kidney disease, or unspecified chronic kidney disease: Secondary | ICD-10-CM | POA: Insufficient documentation

## 2016-12-02 DIAGNOSIS — W1839XA Other fall on same level, initial encounter: Secondary | ICD-10-CM | POA: Diagnosis not present

## 2016-12-02 DIAGNOSIS — Y92002 Bathroom of unspecified non-institutional (private) residence single-family (private) house as the place of occurrence of the external cause: Secondary | ICD-10-CM | POA: Insufficient documentation

## 2016-12-02 DIAGNOSIS — J449 Chronic obstructive pulmonary disease, unspecified: Secondary | ICD-10-CM | POA: Insufficient documentation

## 2016-12-02 DIAGNOSIS — N184 Chronic kidney disease, stage 4 (severe): Secondary | ICD-10-CM | POA: Diagnosis not present

## 2016-12-02 DIAGNOSIS — E039 Hypothyroidism, unspecified: Secondary | ICD-10-CM | POA: Diagnosis not present

## 2016-12-02 DIAGNOSIS — Z79899 Other long term (current) drug therapy: Secondary | ICD-10-CM | POA: Diagnosis not present

## 2016-12-02 DIAGNOSIS — R079 Chest pain, unspecified: Secondary | ICD-10-CM | POA: Diagnosis not present

## 2016-12-02 DIAGNOSIS — Y939 Activity, unspecified: Secondary | ICD-10-CM | POA: Diagnosis not present

## 2016-12-02 DIAGNOSIS — R0602 Shortness of breath: Secondary | ICD-10-CM | POA: Diagnosis not present

## 2016-12-02 DIAGNOSIS — Z96651 Presence of right artificial knee joint: Secondary | ICD-10-CM | POA: Insufficient documentation

## 2016-12-02 DIAGNOSIS — R0789 Other chest pain: Secondary | ICD-10-CM | POA: Diagnosis not present

## 2016-12-02 LAB — CBC
HEMATOCRIT: 41 % (ref 36.0–46.0)
Hemoglobin: 13.4 g/dL (ref 12.0–15.0)
MCH: 33.5 pg (ref 26.0–34.0)
MCHC: 32.7 g/dL (ref 30.0–36.0)
MCV: 102.5 fL — AB (ref 78.0–100.0)
PLATELETS: 261 10*3/uL (ref 150–400)
RBC: 4 MIL/uL (ref 3.87–5.11)
RDW: 13.2 % (ref 11.5–15.5)
WBC: 11 10*3/uL — ABNORMAL HIGH (ref 4.0–10.5)

## 2016-12-02 LAB — I-STAT TROPONIN, ED: Troponin i, poc: 0 ng/mL (ref 0.00–0.08)

## 2016-12-02 LAB — BASIC METABOLIC PANEL
Anion gap: 8 (ref 5–15)
BUN: 27 mg/dL — ABNORMAL HIGH (ref 6–20)
CHLORIDE: 105 mmol/L (ref 101–111)
CO2: 26 mmol/L (ref 22–32)
CREATININE: 1.38 mg/dL — AB (ref 0.44–1.00)
Calcium: 9.8 mg/dL (ref 8.9–10.3)
GFR calc Af Amer: 39 mL/min — ABNORMAL LOW (ref >60)
GFR calc non Af Amer: 34 mL/min — ABNORMAL LOW (ref >60)
GLUCOSE: 93 mg/dL (ref 65–99)
POTASSIUM: 4.3 mmol/L (ref 3.5–5.1)
SODIUM: 139 mmol/L (ref 135–145)

## 2016-12-02 MED ORDER — IOPAMIDOL (ISOVUE-370) INJECTION 76%
100.0000 mL | Freq: Once | INTRAVENOUS | Status: AC | PRN
  Administered 2016-12-02: 80 mL via INTRAVENOUS

## 2016-12-02 MED ORDER — IOPAMIDOL (ISOVUE-370) INJECTION 76%
INTRAVENOUS | Status: AC
  Filled 2016-12-02: qty 100

## 2016-12-02 NOTE — ED Provider Notes (Signed)
[facility] DEPT Provider Note   CSN: [id number] Arrival date & time: 12/02/16  1414    By signing my name below, I, Macon Large, attest that this documentation has been prepared under the direction and in the presence of  Junius Creamer, NP. Electronically Signed: Macon Large, ED Scribe. 12/02/16. 12:17 AM.  History   Chief Complaint Chief Complaint  Patient presents with  . Chest Tenderness  . Axilla Pain   The history is provided by the patient and a relative. No language interpreter was used.   HPI Comments: Valerie Young is a 83 y.o. female with PMHx of hypothyroidism, COPD, DJD, HTN, pulmonary emboli and lung cancer who presents to the Emergency Department complaining of moderate, constant, left axilla pain onset ~2 months ago. Pt reports associated left-sided chest and breast tenderness. She also notes having intermittent, sharp, chest pain that radiates to her left mid-back and states it is exacerbated with deep breathing. Pt notes her pain began after she fell onto her bottom in her bathroom ~two months ago. Pt denies head injury or LOC. She notes she picked herself up by grasping onto the sink using her left arm. Pt states she has lump on her left chest accompanied by soreness and was worried it could be another pulmonary emboli. Pt state she takes 2, low-strength tylenol, every six hours for her arthritis with minimal relief. She also reports she had radiation followed by chemo had surgery for Lung Cancer and treatment for hx of blood clots. Denies fever and cough.  She was also treated for presumed Shingles with antivirals 1 month ago  The pain is in the same location  Past Medical History:  Diagnosis Date  . 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 Cancer ([place])    right lung ca - 30 years ago / left lung - 11/2012  . Cancer ([place])    Left lung  . COPD (chronic obstructive pulmonary disease) ([place])   . DJD (degenerative joint disease)    knees  . H/O:  lung cancer 1982   Right; treated with resection  . Hypertension   . Hypothyroidism   . PND (post-nasal drip)    Chronic    Patient Active Problem List   Diagnosis Date Noted  . Adenocarcinoma, lung ([place])   . Lung cancer ([place]) 10/02/2015  . Hypothyroidism 10/02/2015  . Arthritis of knee 01/06/2014  . Essential hypertension, benign 01/06/2014  . Encounter for therapeutic drug monitoring 12/17/2013  . Pulmonary emboli ([place]) 11/23/2013  . DVT (deep venous thrombosis) ([place]) 11/23/2013  . COPD (chronic obstructive pulmonary disease) with emphysema ([place]) 11/22/2013  . Acute respiratory failure with hypoxia ([place]) 11/22/2013  . Leukocytosis, unspecified 11/22/2013  . Anemia of chronic disease 11/22/2013  . CAP (community acquired pneumonia) 11/22/2013  . Chronic kidney disease (CKD), stage IV (severe) ([place]) 11/15/2013  . Adenocarcinoma of lung ([place]) 12/11/2012  . HYPOTHYROIDISM 06/19/2007  . HYPERTENSION 06/19/2007    Past Surgical History:  Procedure Laterality Date  . ABDOMINAL HYSTERECTOMY     PARTIAL  . CATARACT EXTRACTION, BILATERAL    . JOINT REPLACEMENT     RIGHT TOTAL KNEE  . RADIATION OF LEFT LUNG    . right lung cancer treated with resection  1982  . right thigh postoperative hematoma  02-2008  . RIGHT UPPER LOBE REMOVED    . THYROID SURGERY    . THYROIDECTOMY    . TOTAL KNEE ARTHROPLASTY     Right  . VESICOVAGINAL FISTULA  CLOSURE W/ TAH    . VIDEO BRONCHOSCOPY  12/08/2012   Procedure: VIDEO BRONCHOSCOPY WITH FLUORO;  Surgeon: Kathee Delton, MD;  Location: [facility] ENDOSCOPY;  Service: Cardiopulmonary;  Laterality: Bilateral;    OB History    Gravida Para Term Preterm AB Living   0 0 0 0 0     SAB TAB Ectopic Multiple Live Births   0 0 0           Home Medications    Prior to Admission medications   Medication Sig Start Date End Date Taking? Authorizing Provider  acetaminophen-codeine (TYLENOL #3) 300-30 MG tablet Take 1 tablet by mouth every 4 (four) hours as  needed for moderate pain or severe pain.    Historical Provider, MD  albuterol (PROVENTIL HFA;VENTOLIN HFA) 108 (90 BASE) MCG/ACT inhaler Inhale 2 puffs into the lungs every 6 (six) hours as needed for wheezing or shortness of breath. 11/01/15   Collene Gobble, MD  amLODipine (NORVASC) 5 MG tablet TAKE 1 TABLET (5 MG TOTAL) BY MOUTH AT BEDTIME.    Tresa Garter, MD  atenolol (TENORMIN) 50 MG tablet Take 1 tablet (50 mg total) by mouth daily. 01/06/14   Tresa Garter, MD  cetirizine (ZYRTEC) 10 MG tablet TAKE 1 TABLET (10 MG TOTAL) BY MOUTH AT BEDTIME. 11/06/15   Collene Gobble, MD  cholecalciferol (VITAMIN D) 1000 UNITS tablet Take 1,000 Units by mouth daily.    Historical Provider, MD  guaiFENesin (MUCINEX) 600 MG 12 hr tablet Take 2 tablets (1,200 mg total) by mouth 2 (two) times daily. 10/06/15   Hosie Poisson, MD  levothyroxine (SYNTHROID, LEVOTHROID) 50 MCG tablet Take 50 mcg by mouth daily. 09/12/15   Historical Provider, MD  pravastatin (PRAVACHOL) 40 MG tablet Take 40 mg by mouth daily. 07/16/15   Historical Provider, MD    Family History Family History  Problem Relation Age of Onset  . CVA Mother 76    hemorrhagic     Social History Social History  Substance Use Topics  . Smoking status: Former Smoker    Packs/day: 1.00    Years: 35.00    Types: Cigarettes    Quit date: 11/25/1980  . Smokeless tobacco: Never Used  . Alcohol use No     Allergies   Sulfamethoxazole-trimethoprim and Other   Review of Systems Review of Systems  Constitutional: Negative for appetite change and fever.  Respiratory: Negative for cough.   Cardiovascular: Positive for chest pain.  Musculoskeletal: Positive for back pain and myalgias.  All other systems reviewed and are negative.    Physical Exam Updated Vital Signs BP 178/76 (BP Location: Right Arm)   Pulse 64   Temp 97.8 F (36.6 C) (Oral)   Resp 18   Wt 54.4 kg   SpO2 99%   BMI 20.60 kg/m   Physical Exam   ED  Treatments / Results   DIAGNOSTIC STUDIES: Oxygen Saturation is 98% on RA, normal by my interpretation.    COORDINATION OF CARE: 11:59 PM Discussed treatment plan with pt at bedside which includes labs, angio CT, chest imaging, EKG and Iopamidol injection and  and pt agreed to plan.   Labs (all labs ordered are listed, but only abnormal results are displayed) Labs Reviewed  BASIC METABOLIC PANEL - Abnormal; Notable for the following:       Result Value   BUN 27 (*)    Creatinine, Ser 1.38 (*)    GFR calc non Af Amer 34 (*)  GFR calc Af Amer 39 (*)    All other components within normal limits  CBC - Abnormal; Notable for the following:    WBC 11.0 (*)    MCV 102.5 (*)    All other components within normal limits  I-STAT TROPOININ, ED    EKG  EKG Interpretation None       Radiology Dg Chest 2 View  Result Date: 12/02/2016 CLINICAL DATA:  History of lung cancer. Short of breath. Left chest pain. EXAM: CHEST  2 VIEW COMPARISON:  None. FINDINGS: There is a soft tissue mass at the left apex and along the left sided mediastinum. Lungs are hyperaerated. Postop changes in the right lung with stable sub in the right paratracheal region and inferolateral right upper lobe. Volume loss at the right base. No pneumothorax. No pleural effusion. IMPRESSION: The study is abnormal. There is abnormal mass effect at the left apex and along the left side of the mediastinum. Active malignancy cannot be excluded. Comparison with prior studies or CT chest are recommended. Electronically Signed   By: Marybelle Killings M.D.   On: 12/02/2016 15:25   Ct Angio Chest Pe W And/or Wo Contrast  Result Date: 12/02/2016 CLINICAL DATA:  Intermittent left-sided chest pain. Bilateral lung cancer, in remission. EXAM: CT ANGIOGRAPHY CHEST WITH CONTRAST TECHNIQUE: Multidetector CT imaging of the chest was performed using the standard protocol during bolus administration of intravenous contrast. Multiplanar CT image  reconstructions and MIPs were obtained to evaluate the vascular anatomy. CONTRAST:  80 cc of Isovue 370 COMPARISON:  Plain films of earlier today.  No prior CT. FINDINGS: Cardiovascular: The quality of this exam for evaluation of pulmonary embolism is good. No evidence of pulmonary embolism. Aortic and branch vessel atherosclerosis. Cardiomegaly, accentuated by a pectus excavatum deformity. Multivessel coronary artery atherosclerosis. Mediastinum/Nodes: No supraclavicular adenopathy. No mediastinal or hilar adenopathy. Lungs/Pleura: No pleural fluid. Status post right upper lobectomy. Moderate centrilobular emphysema. Surgical sutures in the right lower lobe on image 46/series 7. Minimal right lower lobe nodularity anteriorly, likely post infectious or inflammatory. Example image 59/series 7. Area of cavitation in left lower lobe measures maximally 2.5 cm on image 51/series 7. No well-defined soft tissue component. Medial left upper lobe and apical masslike consolidation may be radiation induced. Concurrent volume loss. Example image 26/series 7. Upper Abdomen: Normal imaged portions of the liver, spleen, stomach, adrenal glands, kidneys. Musculoskeletal: No acute osseous abnormality. Review of the MIP images confirms the above findings. IMPRESSION: 1.  No evidence of pulmonary embolism. 2. Status post right upper lobectomy. Masslike consolidation within the medial left apex and upper lobe could be radiation induced. Without priors for comparison, residual tumor cannot be excluded. Correlation with prior exams recommended. If no priors available, consider short term CT follow-up or PET. 3. Cavitary focus in left lower lobe may simply represent an area of focal bullous disease. No well-defined soft tissue component. Comparison with priors or short term CT follow-up at 3 months suggested. 4. No thoracic adenopathy. Electronically Signed   By: Abigail Miyamoto M.D.   On: 12/02/2016 21:51    Procedures Procedures  (including critical care time)  Medications Ordered in ED Medications  iopamidol (ISOVUE-370) 76 % injection (not administered)  iopamidol (ISOVUE-370) 76 % injection 100 mL (80 mLs Intravenous Contrast Given 12/02/16 2123)     Initial Impression / Assessment and Plan / ED Course  I have reviewed the triage vital signs and the nursing notes.  Pertinent labs & imaging results that were available  during my care of the patient were reviewed by me and considered in my medical decision making (see chart for details).  Clinical Course      Ct scan shows no PE  Patients pain is in the same location as presumed shingle  I feel this is neuropathic pain   Final Clinical Impressions(s) / ED Diagnoses   Final diagnoses:  Left-sided chest wall pain    New Prescriptions New Prescriptions   No medications on file    I personally performed the services described in this documentation, which was scribed in my presence. The recorded information has been reviewed and is accurate.    Junius Creamer, NP 12/03/16 [place], NP 12/03/16 [place], MD 12/03/16 [phone]

## 2016-12-02 NOTE — Telephone Encounter (Signed)
noted 

## 2016-12-02 NOTE — ED Triage Notes (Signed)
Pt c/o intermittent L axilla pain, L chest/breast tenderness, and intermittent sharp, L chest pain w/ deep breathing since "sometime in November."  Pain score 5/10.  Pt reports that pain began after a fall, but she fell directly on her buttocks.  Hx of blood clots and bilateral lung CA.  CA is in remission.

## 2016-12-03 NOTE — Discharge Instructions (Signed)
The pain you are having may be a result of the "shingles" and is called neuropathic pain and may last for months this can be treated with medications  Make an appointment with your PCP to discuss this  The special CT Scan you have today shows no sign of Pulmonary Emboli

## 2017-01-15 DIAGNOSIS — E890 Postprocedural hypothyroidism: Secondary | ICD-10-CM | POA: Diagnosis not present

## 2017-01-15 DIAGNOSIS — I10 Essential (primary) hypertension: Secondary | ICD-10-CM | POA: Diagnosis not present

## 2017-01-15 DIAGNOSIS — E782 Mixed hyperlipidemia: Secondary | ICD-10-CM | POA: Diagnosis not present

## 2017-01-15 DIAGNOSIS — R2681 Unsteadiness on feet: Secondary | ICD-10-CM | POA: Diagnosis not present

## 2017-01-15 DIAGNOSIS — M1712 Unilateral primary osteoarthritis, left knee: Secondary | ICD-10-CM | POA: Diagnosis not present

## 2017-01-15 DIAGNOSIS — J431 Panlobular emphysema: Secondary | ICD-10-CM | POA: Diagnosis not present

## 2017-01-15 DIAGNOSIS — Z85118 Personal history of other malignant neoplasm of bronchus and lung: Secondary | ICD-10-CM | POA: Diagnosis not present

## 2017-01-15 DIAGNOSIS — D638 Anemia in other chronic diseases classified elsewhere: Secondary | ICD-10-CM | POA: Diagnosis not present

## 2017-01-22 ENCOUNTER — Other Ambulatory Visit: Payer: Self-pay | Admitting: Family Medicine

## 2017-01-22 DIAGNOSIS — Z1231 Encounter for screening mammogram for malignant neoplasm of breast: Secondary | ICD-10-CM

## 2017-01-24 ENCOUNTER — Ambulatory Visit: Payer: Medicare Other

## 2017-02-04 ENCOUNTER — Ambulatory Visit: Payer: Medicare Other

## 2017-03-06 ENCOUNTER — Encounter: Payer: Self-pay | Admitting: Emergency Medicine

## 2017-03-06 ENCOUNTER — Ambulatory Visit (INDEPENDENT_AMBULATORY_CARE_PROVIDER_SITE_OTHER): Payer: Medicare Other | Admitting: Emergency Medicine

## 2017-03-06 VITALS — BP 124/64 | HR 62 | Ht 64.0 in | Wt 120.0 lb

## 2017-03-06 DIAGNOSIS — R911 Solitary pulmonary nodule: Secondary | ICD-10-CM | POA: Diagnosis not present

## 2017-03-06 DIAGNOSIS — J438 Other emphysema: Secondary | ICD-10-CM

## 2017-03-06 NOTE — Assessment & Plan Note (Signed)
Minimal sx, stable at this time. continue SABA prn.

## 2017-03-06 NOTE — Patient Instructions (Signed)
We will plan to repeat your Ct scan of the chest in July 2018, no contrast, to follow pulmonary nodule.  Keep albuterol available to use 2 puffs up to every 4 hours if needed for shortness of breath.  Follow with Dr Lamonte Sakai in July after the Ct scan to review the results.

## 2017-03-06 NOTE — Assessment & Plan Note (Signed)
RLL GG nodule, stable after 8 months on most recent scan. Hx lung cancer. Need to repeat in July 2018.

## 2017-03-06 NOTE — Progress Notes (Signed)
Subjective:    Patient ID: Valerie Young, female    DOB: 1929-05-21, 83 y.o.   MRN: [id number]  HPI 83 yo woman, former tobacco (45 pk-yrs), hx NSCLCA s/p RUL resection, LUL NSCLCA s/p XRT with curative intent. Has been followed by Dr Gwenette Greet for COPD.  She was admitted last month for a CAP noted on CT chest, was treated with abx and steroids. She felt better at discharge, has done well until 12/6 when she developed fatigue, no cough, no fever. She s not on maintenance BD's, has albuterol to use prn. She does not feel that she needs scheduled BD's when she is at baseline. She denies SOB right now.   ROV 04/09/16 -- patient follows up for history of possible cell lung cancer bilaterally with treatment as above. She also has history of COPD.  She had a repeat Ct chest 04/04/16 that showed improvement in her RLL GGI although there is still a residual rounded area of GGI in the medial RLL. She states that her breathing is doing well, not limiting her. She has not needed any albuterol.   ROV 03/06/17 -- follow up visit for COPD and NSCLCA (RUL and LUL). Have been following some RLL changes on Ct chest. She underwent a scan on 12/02/16 that showed LUL radiation change, LLL cavitary lesion, ? Bullous. The RLL GGI and the bullous are stable on my review. She is currently on albuterol only, uses this very rarely. No flares, no hospitalizations. Minimal daily sx.    Review of Systems As per HPI       Objective:   Physical Exam Vitals:   03/06/17 1606  BP: 124/64  Pulse: 62  SpO2: 92%  Weight: 120 lb (54.4 kg)  Height: '5\' 4"'$  (1.626 m)   Gen: Pleasant, thin elderly, in no distress,  normal affect  ENT: No lesions,  mouth clear,  oropharynx clear, no postnasal drip  Neck: No JVD, no TMG, no carotid bruits  Lungs: No use of accessory muscles, clear without rales or rhonchi  Cardiovascular: RRR, heart sounds normal, no murmur or gallops, no peripheral edema  Musculoskeletal: No deformities, no  cyanosis or clubbing  Neuro: alert, non focal  Skin: Warm, no lesions or rashes  04/04/16 --  COMPARISON: CT chest of 10/02/2015 and 02/16/2014  FINDINGS: Lucency without parenchymal markings within the left lung apex antral laterally may represent a small loculated left hydro pneumothorax. Again consolidation of much of the left upper lobe is noted. Also there are further evolutionary changes of radiation fibrosis along the left medial lung extending anteriorly to posteriorly. A cavitary area in the posterior medial left lower lobe has increased somewhat in size compared to the CT from 10/02/2015, now measure 2.0 x 2.0 cm with thin walls and no fluid within it. Attention to this area on followup CT of the chest is recommended. Mild right apical pleural-parenchymal scarring remains. Changes of right upper lobectomy are again noted. The questioned right suprahilar nodular opacity is unchanged, some of which may be due to a tortuous pulmonary vessel. The 3 mm nodule noted in the right lower lobe on image 79 is less noticeable. A tiny nodule in the right middle lobe described previously now on image 88 is also less noticeable. However, there is a new focus of airspace disease in the posterior medial right lower lobe superior segment, measuring 7 x 15 x 11 mm with part solid component. Previously there was more airspace disease which has completely resolved. This may  represent residual of that process, but this area has slightly increased in size, previously measuring 6 x 7 x 11 mm.  On soft tissue window images, the thyroid gland is small and unremarkable. On this unenhanced study, only small mediastinal lymph nodes are present none of which are pathologically enlarged. The main pulmonary artery segment is somewhat prominent suggesting a degree of pulmonary arterial hypertension in this patient with centrilobular emphysema. Diffuse coronary artery calcifications are again noted.  The thoracic vertebrae are in normal alignment.  IMPRESSION: 1. The ground-glass opacities described in the right lower lobe on the prior study have resolved with the exception of a single persisting ground-glass opacity in the posterior medial right lower lobe. This area has increased somewhat in size compared to the most recent CT and was not present on the CT from 2013. A slow growing low-grade adenocarcinoma cannot be excluded. Since this ground-glass opacity has persisted, repeat CT is recommended every 2 years until 5 years of stability has been established. This recommendation follows the consensus statement: Guidelines for Management of Incidental Pulmonary Nodules Detected on CT Images:From the Fleischner Society 2017; published online before print (10.1148/radiol.1610960454). 2. Lucency in the left lung apex may represent a small loculated left hydro pneumothorax. 3. Further evolutionary changes of radiation fibrosis along the left medial lung. 4. Previously described lung nodules have remained stable. 5. Diffuse coronary artery calcifications.     Assessment & Plan:  COPD (chronic obstructive pulmonary disease) with emphysema Minimal sx, stable at this time. continue SABA prn.   Pulmonary nodule RLL GG nodule, stable after 8 months on most recent scan. Hx lung cancer. Need to repeat in July 2018.   Baltazar Apo, MD, PhD 03/06/2017, 4:29 PM [facility] [phone] or if no answer [phone]

## 2017-03-10 ENCOUNTER — Telehealth: Payer: Self-pay | Admitting: Emergency Medicine

## 2017-03-10 NOTE — Telephone Encounter (Signed)
Called and spoke with Valerie Young ---she is aware that RB is working night float and I will sent this message to him to sign off of the order.  The CT will not be done until July, so he has time to sign this order.  RB please advise once this order has been signed. thanks

## 2017-03-11 NOTE — Telephone Encounter (Signed)
done

## 2017-03-12 NOTE — Telephone Encounter (Signed)
Spoke with Manuela Schwartz with Radiology scheduling, and made her aware that order has been signed. Nothing further needed.

## 2017-03-27 DIAGNOSIS — H35033 Hypertensive retinopathy, bilateral: Secondary | ICD-10-CM | POA: Diagnosis not present

## 2017-03-27 DIAGNOSIS — H353130 Nonexudative age-related macular degeneration, bilateral, stage unspecified: Secondary | ICD-10-CM | POA: Diagnosis not present

## 2017-03-27 DIAGNOSIS — H34211 Partial retinal artery occlusion, right eye: Secondary | ICD-10-CM | POA: Diagnosis not present

## 2017-03-27 DIAGNOSIS — H26492 Other secondary cataract, left eye: Secondary | ICD-10-CM | POA: Diagnosis not present

## 2017-03-27 DIAGNOSIS — Z961 Presence of intraocular lens: Secondary | ICD-10-CM | POA: Diagnosis not present

## 2017-04-07 DIAGNOSIS — H34211 Partial retinal artery occlusion, right eye: Secondary | ICD-10-CM | POA: Diagnosis not present

## 2017-04-09 ENCOUNTER — Emergency Department (HOSPITAL_COMMUNITY): Payer: Medicare Other

## 2017-04-09 ENCOUNTER — Inpatient Hospital Stay (HOSPITAL_COMMUNITY): Payer: Medicare Other

## 2017-04-09 ENCOUNTER — Inpatient Hospital Stay (HOSPITAL_COMMUNITY)
Admission: EM | Admit: 2017-04-09 | Discharge: 2017-04-11 | DRG: 200 | Disposition: A | Discharge status: Home / Self Care | Payer: Medicare Other | Attending: Family Medicine | Admitting: Family Medicine

## 2017-04-09 ENCOUNTER — Encounter (HOSPITAL_COMMUNITY): Payer: Self-pay

## 2017-04-09 DIAGNOSIS — J9811 Atelectasis: Secondary | ICD-10-CM | POA: Diagnosis not present

## 2017-04-09 DIAGNOSIS — I270 Primary pulmonary hypertension: Secondary | ICD-10-CM | POA: Diagnosis present

## 2017-04-09 DIAGNOSIS — J90 Pleural effusion, not elsewhere classified: Secondary | ICD-10-CM | POA: Diagnosis present

## 2017-04-09 DIAGNOSIS — C3401 Malignant neoplasm of right main bronchus: Secondary | ICD-10-CM | POA: Diagnosis not present

## 2017-04-09 DIAGNOSIS — J439 Emphysema, unspecified: Secondary | ICD-10-CM | POA: Diagnosis not present

## 2017-04-09 DIAGNOSIS — I10 Essential (primary) hypertension: Secondary | ICD-10-CM | POA: Diagnosis not present

## 2017-04-09 DIAGNOSIS — Z85118 Personal history of other malignant neoplasm of bronchus and lung: Secondary | ICD-10-CM | POA: Diagnosis not present

## 2017-04-09 DIAGNOSIS — J939 Pneumothorax, unspecified: Secondary | ICD-10-CM | POA: Diagnosis present

## 2017-04-09 DIAGNOSIS — J449 Chronic obstructive pulmonary disease, unspecified: Secondary | ICD-10-CM | POA: Diagnosis not present

## 2017-04-09 DIAGNOSIS — I129 Hypertensive chronic kidney disease with stage 1 through stage 4 chronic kidney disease, or unspecified chronic kidney disease: Secondary | ICD-10-CM | POA: Diagnosis not present

## 2017-04-09 DIAGNOSIS — Z66 Do not resuscitate: Secondary | ICD-10-CM | POA: Diagnosis present

## 2017-04-09 DIAGNOSIS — D499 Neoplasm of unspecified behavior of unspecified site: Secondary | ICD-10-CM

## 2017-04-09 DIAGNOSIS — J9311 Primary spontaneous pneumothorax: Secondary | ICD-10-CM | POA: Diagnosis not present

## 2017-04-09 DIAGNOSIS — J438 Other emphysema: Secondary | ICD-10-CM

## 2017-04-09 DIAGNOSIS — I2699 Other pulmonary embolism without acute cor pulmonale: Secondary | ICD-10-CM | POA: Diagnosis present

## 2017-04-09 DIAGNOSIS — Z888 Allergy status to other drugs, medicaments and biological substances status: Secondary | ICD-10-CM | POA: Diagnosis not present

## 2017-04-09 DIAGNOSIS — Z87891 Personal history of nicotine dependence: Secondary | ICD-10-CM

## 2017-04-09 DIAGNOSIS — Z86711 Personal history of pulmonary embolism: Secondary | ICD-10-CM

## 2017-04-09 DIAGNOSIS — Z902 Acquired absence of lung [part of]: Secondary | ICD-10-CM | POA: Diagnosis not present

## 2017-04-09 DIAGNOSIS — Z90710 Acquired absence of both cervix and uterus: Secondary | ICD-10-CM

## 2017-04-09 DIAGNOSIS — Z96651 Presence of right artificial knee joint: Secondary | ICD-10-CM | POA: Diagnosis present

## 2017-04-09 DIAGNOSIS — J9383 Other pneumothorax: Secondary | ICD-10-CM | POA: Diagnosis not present

## 2017-04-09 DIAGNOSIS — E039 Hypothyroidism, unspecified: Secondary | ICD-10-CM | POA: Diagnosis not present

## 2017-04-09 DIAGNOSIS — Z923 Personal history of irradiation: Secondary | ICD-10-CM | POA: Diagnosis not present

## 2017-04-09 DIAGNOSIS — R918 Other nonspecific abnormal finding of lung field: Secondary | ICD-10-CM

## 2017-04-09 DIAGNOSIS — Z79899 Other long term (current) drug therapy: Secondary | ICD-10-CM

## 2017-04-09 DIAGNOSIS — D638 Anemia in other chronic diseases classified elsewhere: Secondary | ICD-10-CM | POA: Diagnosis not present

## 2017-04-09 DIAGNOSIS — Z823 Family history of stroke: Secondary | ICD-10-CM | POA: Diagnosis not present

## 2017-04-09 DIAGNOSIS — J9312 Secondary spontaneous pneumothorax: Secondary | ICD-10-CM | POA: Diagnosis not present

## 2017-04-09 DIAGNOSIS — J432 Centrilobular emphysema: Secondary | ICD-10-CM | POA: Diagnosis not present

## 2017-04-09 DIAGNOSIS — R079 Chest pain, unspecified: Secondary | ICD-10-CM | POA: Diagnosis not present

## 2017-04-09 DIAGNOSIS — C3490 Malignant neoplasm of unspecified part of unspecified bronchus or lung: Secondary | ICD-10-CM | POA: Diagnosis present

## 2017-04-09 DIAGNOSIS — N184 Chronic kidney disease, stage 4 (severe): Secondary | ICD-10-CM | POA: Diagnosis present

## 2017-04-09 DIAGNOSIS — K5732 Diverticulitis of large intestine without perforation or abscess without bleeding: Secondary | ICD-10-CM | POA: Diagnosis not present

## 2017-04-09 HISTORY — DX: Chronic kidney disease, unspecified: N18.9

## 2017-04-09 LAB — CBC WITH DIFFERENTIAL/PLATELET
BASOS ABS: 0 10*3/uL (ref 0.0–0.1)
BASOS PCT: 0 %
Eosinophils Absolute: 0.2 10*3/uL (ref 0.0–0.7)
Eosinophils Relative: 3 %
HCT: 43.4 % (ref 36.0–46.0)
HEMOGLOBIN: 13.7 g/dL (ref 12.0–15.0)
Lymphocytes Relative: 21 %
Lymphs Abs: 1.8 10*3/uL (ref 0.7–4.0)
MCH: 32 pg (ref 26.0–34.0)
MCHC: 31.6 g/dL (ref 30.0–36.0)
MCV: 101.4 fL — ABNORMAL HIGH (ref 78.0–100.0)
MONOS PCT: 10 %
Monocytes Absolute: 0.8 10*3/uL (ref 0.1–1.0)
NEUTROS ABS: 5.5 10*3/uL (ref 1.7–7.7)
NEUTROS PCT: 66 %
Platelets: 217 10*3/uL (ref 150–400)
RBC: 4.28 MIL/uL (ref 3.87–5.11)
RDW: 13 % (ref 11.5–15.5)
WBC: 8.3 10*3/uL (ref 4.0–10.5)

## 2017-04-09 LAB — I-STAT CHEM 8, ED
BUN: 21 mg/dL — ABNORMAL HIGH (ref 6–20)
CHLORIDE: 106 mmol/L (ref 101–111)
Calcium, Ion: 1.2 mmol/L (ref 1.15–1.40)
Creatinine, Ser: 1.3 mg/dL — ABNORMAL HIGH (ref 0.44–1.00)
Glucose, Bld: 98 mg/dL (ref 65–99)
HEMATOCRIT: 42 % (ref 36.0–46.0)
Hemoglobin: 14.3 g/dL (ref 12.0–15.0)
Potassium: 4.2 mmol/L (ref 3.5–5.1)
SODIUM: 143 mmol/L (ref 135–145)
TCO2: 29 mmol/L (ref 0–100)

## 2017-04-09 LAB — URINALYSIS, ROUTINE W REFLEX MICROSCOPIC
BILIRUBIN URINE: NEGATIVE
GLUCOSE, UA: NEGATIVE mg/dL
HGB URINE DIPSTICK: NEGATIVE
Ketones, ur: NEGATIVE mg/dL
Leukocytes, UA: NEGATIVE
Nitrite: NEGATIVE
PH: 7 (ref 5.0–8.0)
Protein, ur: NEGATIVE mg/dL
SPECIFIC GRAVITY, URINE: 1.005 (ref 1.005–1.030)

## 2017-04-09 LAB — COMPREHENSIVE METABOLIC PANEL
ALBUMIN: 4.1 g/dL (ref 3.5–5.0)
ALK PHOS: 76 U/L (ref 38–126)
ALT: 12 U/L — AB (ref 14–54)
AST: 24 U/L (ref 15–41)
Anion gap: 6 (ref 5–15)
BUN: 15 mg/dL (ref 6–20)
CALCIUM: 9.7 mg/dL (ref 8.9–10.3)
CO2: 28 mmol/L (ref 22–32)
Chloride: 106 mmol/L (ref 101–111)
Creatinine, Ser: 1.29 mg/dL — ABNORMAL HIGH (ref 0.44–1.00)
GFR calc Af Amer: 42 mL/min — ABNORMAL LOW (ref >60)
GFR calc non Af Amer: 36 mL/min — ABNORMAL LOW (ref >60)
GLUCOSE: 109 mg/dL — AB (ref 65–99)
Potassium: 4 mmol/L (ref 3.5–5.1)
Sodium: 140 mmol/L (ref 135–145)
Total Bilirubin: 0.9 mg/dL (ref 0.3–1.2)
Total Protein: 6.8 g/dL (ref 6.5–8.1)

## 2017-04-09 LAB — I-STAT TROPONIN, ED: Troponin i, poc: 0 ng/mL (ref 0.00–0.08)

## 2017-04-09 LAB — TROPONIN I: Troponin I: <0.03 ng/mL (ref <0.03)

## 2017-04-09 LAB — PROCALCITONIN

## 2017-04-09 MED ORDER — ONDANSETRON HCL 4 MG PO TABS: 4.0000 mg | ORAL_TABLET | Freq: Four times a day (QID) | ORAL | Status: DC | PRN

## 2017-04-09 MED ORDER — LORATADINE 10 MG PO TABS
10.0000 mg | ORAL_TABLET | Freq: Every day | ORAL | Status: DC
  Administered 2017-04-10 – 2017-04-11 (×2): 10 mg via ORAL
  Filled 2017-04-09 (×3): qty 1

## 2017-04-09 MED ORDER — ACETAMINOPHEN 650 MG RE SUPP: 650.0000 mg | Freq: Four times a day (QID) | RECTAL | Status: DC | PRN

## 2017-04-09 MED ORDER — HEPARIN BOLUS VIA INFUSION
2000.0000 [IU] | Freq: Once | INTRAVENOUS | Status: AC
  Administered 2017-04-09: 2000 [IU] via INTRAVENOUS
  Filled 2017-04-09: qty 2000

## 2017-04-09 MED ORDER — ALBUTEROL SULFATE (2.5 MG/3ML) 0.083% IN NEBU: 2.5000 mg | INHALATION_SOLUTION | Freq: Four times a day (QID) | RESPIRATORY_TRACT | Status: DC | PRN

## 2017-04-09 MED ORDER — AMLODIPINE BESYLATE 5 MG PO TABS
5.0000 mg | ORAL_TABLET | Freq: Every day | ORAL | Status: DC
  Administered 2017-04-09 – 2017-04-11 (×3): 5 mg via ORAL
  Filled 2017-04-09 (×3): qty 1

## 2017-04-09 MED ORDER — TRAZODONE HCL 50 MG PO TABS
25.0000 mg | ORAL_TABLET | Freq: Every evening | ORAL | Status: DC | PRN
  Administered 2017-04-09 – 2017-04-10 (×2): 25 mg via ORAL
  Filled 2017-04-09 (×2): qty 1

## 2017-04-09 MED ORDER — IOPAMIDOL (ISOVUE-300) INJECTION 61%
INTRAVENOUS | Status: AC
  Administered 2017-04-09: 30 mL
  Filled 2017-04-09: qty 30

## 2017-04-09 MED ORDER — PRAVASTATIN SODIUM 40 MG PO TABS
40.0000 mg | ORAL_TABLET | Freq: Every day | ORAL | Status: DC
  Administered 2017-04-09 – 2017-04-11 (×3): 40 mg via ORAL
  Filled 2017-04-09 (×3): qty 1

## 2017-04-09 MED ORDER — LEVOTHYROXINE SODIUM 50 MCG PO TABS
50.0000 ug | ORAL_TABLET | Freq: Every day | ORAL | Status: DC
  Administered 2017-04-09 – 2017-04-11 (×3): 50 ug via ORAL
  Filled 2017-04-09 (×3): qty 1

## 2017-04-09 MED ORDER — ONDANSETRON HCL 4 MG/2ML IJ SOLN: 4.0000 mg | Freq: Four times a day (QID) | INTRAMUSCULAR | Status: DC | PRN

## 2017-04-09 MED ORDER — IOPAMIDOL (ISOVUE-300) INJECTION 61%
INTRAVENOUS | Status: AC
  Administered 2017-04-09: 75 mL via INTRAVENOUS
  Filled 2017-04-09: qty 75

## 2017-04-09 MED ORDER — HYDROCODONE-ACETAMINOPHEN 5-325 MG PO TABS
1.0000 | ORAL_TABLET | ORAL | Status: DC | PRN
  Administered 2017-04-10 – 2017-04-11 (×3): 1 via ORAL
  Filled 2017-04-09 (×3): qty 1

## 2017-04-09 MED ORDER — ATENOLOL 50 MG PO TABS
50.0000 mg | ORAL_TABLET | Freq: Every day | ORAL | Status: DC
  Administered 2017-04-09 – 2017-04-11 (×3): 50 mg via ORAL
  Filled 2017-04-09: qty 1
  Filled 2017-04-09 (×3): qty 2
  Filled 2017-04-09 (×2): qty 1

## 2017-04-09 MED ORDER — VITAMIN D 1000 UNITS PO TABS: 1000.0000 [IU] | ORAL_TABLET | Freq: Every day | ORAL | Status: DC

## 2017-04-09 MED ORDER — ACETAMINOPHEN 325 MG PO TABS: 650.0000 mg | ORAL_TABLET | Freq: Four times a day (QID) | ORAL | Status: DC | PRN

## 2017-04-09 MED ORDER — GUAIFENESIN ER 600 MG PO TB12: 1200.0000 mg | ORAL_TABLET | Freq: Two times a day (BID) | ORAL | Status: DC

## 2017-04-09 MED ORDER — SODIUM CHLORIDE 0.9 % IV BOLUS (SEPSIS)
1000.0000 mL | Freq: Once | INTRAVENOUS | Status: AC
  Administered 2017-04-09: 1000 mL via INTRAVENOUS

## 2017-04-09 MED ORDER — SODIUM CHLORIDE 0.9% FLUSH
3.0000 mL | Freq: Two times a day (BID) | INTRAVENOUS | Status: DC
  Administered 2017-04-09 – 2017-04-11 (×2): 3 mL via INTRAVENOUS

## 2017-04-09 MED ORDER — IOPAMIDOL (ISOVUE-300) INJECTION 61%
INTRAVENOUS | Status: AC
  Administered 2017-04-09: 75 mL
  Filled 2017-04-09: qty 75

## 2017-04-09 MED ORDER — HEPARIN (PORCINE) IN NACL 100-0.45 UNIT/ML-% IJ SOLN
750.0000 [IU]/h | INTRAMUSCULAR | Status: DC
  Administered 2017-04-09: 750 [IU]/h via INTRAVENOUS
  Filled 2017-04-09: qty 250

## 2017-04-09 MED ORDER — SODIUM CHLORIDE 0.9 % IV SOLN
INTRAVENOUS | Status: DC
  Administered 2017-04-09 – 2017-04-10 (×2): via INTRAVENOUS

## 2017-04-09 MED ORDER — ACETAMINOPHEN-CODEINE #3 300-30 MG PO TABS: 1.0000 | ORAL_TABLET | ORAL | Status: DC | PRN

## 2017-04-09 NOTE — H&P (Signed)
History and Physical    Valerie Young ZOX:[id number] DOB: 11-23-1929 DOA: 04/09/2017  PCP: Langston Masker, MD Patient coming from: home  Chief Complaint: neck pain/chest pain  HPI: Valerie Young is a very pleasant 83 y.o. female with medical history significant non-small cell cancer status post right upper lobe resection, left upper lobe non-small cell lung cancer status post radiation, COPD, pneumonia, hypertension, chronic kidney disease, PE, hypothyroidism presents to the emergency Department chief complaint chest pain/neck pain. Initial evaluation chest x-ray concerning for bilateral pleural effusions, mild to moderate left apical pneumothorax possible infiltrate on the right.  Information is obtained from the patient. She states she was in her usual state of health until 3 days ago she developed pain in her left anterior chest radiating to her left neck. She describes the pain as sharp intermittent. Associated symptoms include worsening dyspnea. She denies shortness of breath but does endorse "difficulty taking a good deep breath". Small morning her symptoms worsened and the pain in her chest felt more like a "heaviness". She reports activity made the symptoms worse. She denies headache palpitations lower extremity edema. She denies dizziness syncope or near-syncope. She denies abdominal pain nausea vomiting diarrhea constipation melena. She denies dysuria hematuria frequency or urgency.   ED Course: In the emergency department she is hypertensive, afebrile mild tachypnea oxygen saturation level greater than 90% ears.  Review of Systems: As per HPI otherwise all other systems reviewed and are negative.   Ambulatory Status: She ambulates with a cane and a walker with a fairly steady gait. She reports a fall 7 months ago. She lives alone is independent with ADLs. Continues to drive  Past Medical History:  Diagnosis Date  . 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 Cancer  ([place])    right lung ca - 30 years ago / left lung - 11/2012  . Cancer ([place])    Left lung  . CKD (chronic kidney disease)   . COPD (chronic obstructive pulmonary disease) ([place])   . DJD (degenerative joint disease)    knees  . H/O: lung cancer 1982   Right; treated with resection  . Hypertension   . Hypothyroidism   . PND (post-nasal drip)    Chronic    Past Surgical History:  Procedure Laterality Date  . ABDOMINAL HYSTERECTOMY     PARTIAL  . CATARACT EXTRACTION, BILATERAL    . JOINT REPLACEMENT     RIGHT TOTAL KNEE  . RADIATION OF LEFT LUNG    . right lung cancer treated with resection  1982  . right thigh postoperative hematoma  02-2008  . RIGHT UPPER LOBE REMOVED    . THYROID SURGERY    . THYROIDECTOMY    . TOTAL KNEE ARTHROPLASTY     Right  . VESICOVAGINAL FISTULA CLOSURE W/ TAH    . VIDEO BRONCHOSCOPY  12/08/2012   Procedure: VIDEO BRONCHOSCOPY WITH FLUORO;  Surgeon: Kathee Delton, MD;  Location: [facility];  Service: Cardiopulmonary;  Laterality: Bilateral;    Social History   Social History  . Marital status: Divorced    Spouse name: N/A  . Number of children: N/A  . Years of education: N/A   Occupational History  . Retired    Social History Main Topics  . Smoking status: Former Smoker    Packs/day: 1.00    Years: 35.00    Types: Cigarettes    Quit date: 11/25/1980  . Smokeless tobacco: Never Used  . Alcohol use No  .  Drug use: No  . Sexual activity: No   Other Topics Concern  . Not on file   Social History Narrative   ** Merged History Encounter **        Allergies  Allergen Reactions  . Sulfamethoxazole-Trimethoprim     REACTION: rash  . Other Other (See Comments)    Pt states she is allergic to a medication and can not recall what it is     Family History  Problem Relation Age of Onset  . CVA Mother 65       hemorrhagic     Prior to Admission medications   Medication Sig Start Date End Date Taking? Authorizing Provider    acetaminophen-codeine (TYLENOL #3) 300-30 MG tablet Take 1 tablet by mouth every 4 (four) hours as needed for moderate pain or severe pain.    [provider]  albuterol (PROVENTIL HFA;VENTOLIN HFA) 108 (90 BASE) MCG/ACT inhaler Inhale 2 puffs into the lungs every 6 (six) hours as needed for wheezing or shortness of breath. 11/01/15   Collene Gobble, MD  amLODipine (NORVASC) 5 MG tablet TAKE 1 TABLET (5 MG TOTAL) BY MOUTH AT BEDTIME.    Tresa Garter, MD  atenolol (TENORMIN) 50 MG tablet Take 1 tablet (50 mg total) by mouth daily. 01/06/14   Tresa Garter, MD  cetirizine (ZYRTEC) 10 MG tablet TAKE 1 TABLET (10 MG TOTAL) BY MOUTH AT BEDTIME. 11/06/15   Byrum, Rose Fillers, MD  cholecalciferol (VITAMIN D) 1000 UNITS tablet Take 1,000 Units by mouth daily.    [provider]  guaiFENesin (MUCINEX) 600 MG 12 hr tablet Take 2 tablets (1,200 mg total) by mouth 2 (two) times daily. 10/06/15   Hosie Poisson, MD  levothyroxine (SYNTHROID, LEVOTHROID) 50 MCG tablet Take 50 mcg by mouth daily. 09/12/15   [provider]  pravastatin (PRAVACHOL) 40 MG tablet Take 40 mg by mouth daily. 07/16/15   [provider]    Physical Exam: Vitals:   04/09/17 1330 04/09/17 1345 04/09/17 1400 04/09/17 1415  BP: (!) 180/66 (!) 198/78 (!) 176/61 (!) 175/68  Pulse: 62 74 65 67  Resp: 16 16 (!) 24 19  Temp:      TempSrc:      SpO2: 100% 100% 100% 100%  Weight:      Height:         General:  Appears calm and comfortable Eyes:  PERRL, EOMI, normal lids, [place] ENT:  grossly normal hearing, lips & tongue, Mucous membranes of her mouth are pink slightly dry Neck:  no LAD, masses or thyromegaly Cardiovascular:  RRR, no m/r/g. No LE edema.  Respiratory:  Normal effort. Breath sounds diminished particularly on the left. Here no wheeze no crackles Abdomen:  soft, ntnd, positive bowel sounds throughout, no guarding or rebounding Skin:  no rash or induration seen on limited  exam Musculoskeletal:  grossly normal tone BUE/BLE, good ROM, no bony abnormality Psychiatric:  grossly normal mood and affect, speech fluent and appropriate, AOx3 Neurologic:  CN 2-12 grossly intact, moves all extremities in coordinated fashion, sensation intact. Speech clear facial symmetry  Labs on Admission: I have personally reviewed following labs and imaging studies  CBC:  Recent Labs Lab 04/09/17 1305 04/09/17 1326  WBC 8.3  --   NEUTROABS 5.5  --   HGB 13.7 14.3  HCT 43.4 42.0  MCV 101.4*  --   PLT 217  --    Basic Metabolic Panel:  Recent Labs Lab 04/09/17 1305 04/09/17  1326  NA 140 143  K 4.0 4.2  CL 106 106  CO2 28  --   GLUCOSE 109* 98  BUN 15 21*  CREATININE 1.29* 1.30*  CALCIUM 9.7  --    GFR: Estimated Creatinine Clearance: 26.7 mL/min (A) (by C-G formula based on SCr of 1.3 mg/dL (H)). Liver Function Tests:  Recent Labs Lab 04/09/17 1305  AST 24  ALT 12*  ALKPHOS 76  BILITOT 0.9  PROT 6.8  ALBUMIN 4.1   No results for input(s): LIPASE, AMYLASE in the last 168 hours. No results for input(s): AMMONIA in the last 168 hours. Coagulation Profile: No results for input(s): INR, PROTIME in the last 168 hours. Cardiac Enzymes: No results for input(s): CKTOTAL, CKMB, CKMBINDEX, TROPONINI in the last 168 hours. BNP (last 3 results) No results for input(s): PROBNP in the last 8760 hours. HbA1C: No results for input(s): HGBA1C in the last 72 hours. CBG: No results for input(s): GLUCAP in the last 168 hours. Lipid Profile: No results for input(s): CHOL, HDL, LDLCALC, TRIG, CHOLHDL, LDLDIRECT in the last 72 hours. Thyroid Function Tests: No results for input(s): TSH, T4TOTAL, FREET4, T3FREE, THYROIDAB in the last 72 hours. Anemia Panel: No results for input(s): VITAMINB12, FOLATE, FERRITIN, TIBC, IRON, RETICCTPCT in the last 72 hours. Urine analysis:    Component Value Date/Time   COLORURINE YELLOW 04/07/2015 0339   APPEARANCEUR CLEAR  04/07/2015 0339   LABSPEC 1.010 04/07/2015 0339   PHURINE 5.5 04/07/2015 0339   GLUCOSEU NEGATIVE 04/07/2015 0339   HGBUR SMALL (A) 04/07/2015 0339   BILIRUBINUR NEGATIVE 04/07/2015 0339   KETONESUR NEGATIVE 04/07/2015 0339   PROTEINUR NEGATIVE 04/07/2015 0339   UROBILINOGEN 0.2 04/07/2015 0339   NITRITE NEGATIVE 04/07/2015 0339   LEUKOCYTESUR TRACE (A) 04/07/2015 0339    Creatinine Clearance: Estimated Creatinine Clearance: 26.7 mL/min (A) (by C-G formula based on SCr of 1.3 mg/dL (H)).  Sepsis Labs: '@LABRCNTIP'$ (procalcitonin:4,lacticidven:4) )No results found for this or any previous visit (from the past 240 hour(s)).   Radiological Exams on Admission: Dg Chest 2 View  Result Date: 04/09/2017 CLINICAL DATA:  Chest pain. EXAM: CHEST  2 VIEW COMPARISON:  No recent prior. FINDINGS: Surgical clips right chest. Low lung volumes with basilar atelectasis. Mild to moderate left apical pneumothorax noted. Associated left density noted. This could be from atelectasis. Left apical mass cannot be excluded. Small bilateral pleural effusions. Mild infiltrate in the right lung base cannot be excluded. Mild nodular density right lung base cannot be excluded. Follow-up chest x-rays recommended demonstrate resolution of these findings. Degenerative changes thoracic spine. IMPRESSION: 1. Mild to moderate left apical pneumothorax noted. Associated apical density is noted. This could be from atelectasis. Left apical mass cannot be excluded. 2. Postsurgical changes right lung. Infiltrate right lung base. Mild nodular density right lung base cannot be excluded. 3. Small bilateral pleural effusions. Follow-up chest x-rays recommended to demonstrate clearing of these findings. Critical Value/emergent results were called by telephone at the time of interpretation on 04/09/2017 at 1:02 pm to Dr. Shirlyn Goltz , who verbally acknowledged these results. Electronically Signed   By: Marcello Moores  Register   On: 04/09/2017 13:05     EKG: pending at time of admission  Assessment/Plan Principal Problem:   Pneumothorax Active Problems:   Chronic kidney disease (CKD), stage IV (severe) (HCC)   COPD (chronic obstructive pulmonary disease) with emphysema ([place])   Anemia of chronic disease   Pulmonary emboli ([place])   Essential hypertension, benign   Lung cancer ([place])  Hypothyroidism   Pneumothorax on left   Pleural effusion   #1. Pneumothorax/pleural effusion. Pt with hx lung CA . Chest x-ray as noted above. Some concern for mass vs pneumonia given hx. is afebrile no leukocytosis nontoxic appearing. Oxygen saturation level greater than 90% on 2 L nasal cannula.  -Admit to telemetry -Continue oxygen supplementation -Monitor oxygen saturation level -Follow CT chest results -Appreciate assistance from CV Dr Roxy Manns  #2.chest pain. Likely related to above. Heart score 4.  -initial troponin negative -EKG pending at time of admission -Cycle troponin -Supportive therapy -See #1  #3. COPD. Does not oxygen at home. Appears stable at baseline. No wheeze on exam -See above.  #4. Hypertension. Poor control in the emergency department. Home medications include amlodipine, atenolol -Resume home medications -Monitor closely  #5. History of PE. Not on anticoagulation. -Follow CT results -See #1  #6. History of lung cancer. Chart review indicates previous history of right upper lobectomy for lung cancer 30 years ago as well as 3 years ago a new lesion on the left side status post radiation. -follow CT results     DVT prophylaxis: scd  Code Status: dnr  Family Communication: none present  Disposition Plan: home  Consults called: Dr Roxy Manns   Admission status: inpatient    Radene Gunning MD [facility]  If 7PM-7AM, please contact night-coverage [email or web address] Password [email or web address]  04/09/2017, 2:41 PM

## 2017-04-09 NOTE — Progress Notes (Signed)
Dr. Roxy Manns paged to inquire if any intervention to be done tonight/can pt eat.  Per Dr. Roxy Manns, pt is fine to eat tonight.  Dr. Marily Memos notified & verbal order received for regular diet.  Dr. Marily Memos also notified of elevated BP upon arrival, however, pt did not take any BP meds this AM.  Scheduled meds administered as ordered and will recheck BP in 1 hour.  MD aware; no new orders received at this time.  Will continue to monitor.

## 2017-04-09 NOTE — ED Provider Notes (Signed)
[facility] DEPT Provider Note   CSN: [id number] Arrival date & time: 04/09/17  1217     History   Chief Complaint Chief Complaint  Patient presents with  . Neck Pain  . Chest Pain    HPI Valerie Young is a 83 y.o. female hx of COPD, HTN, Previous lung cancer status post resection and radiation here presenting with shortness of breath. She's been having shortness of breath and pleuritic chest pain and left neck pain since yesterday. It progressively got worse today so she came into the ER. She states that she has a history of blood clots and also of lung cancer in the past but is no longer on blood thinners. Denies any fevers or cough. She follows up with [facility] primary care.    The history is provided by the patient.    Past Medical History:  Diagnosis Date  . 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 Cancer ([place])    right lung ca - 30 years ago / left lung - 11/2012  . Cancer ([place])    Left lung  . CKD (chronic kidney disease)   . COPD (chronic obstructive pulmonary disease) ([place])   . DJD (degenerative joint disease)    knees  . H/O: lung cancer 1982   Right; treated with resection  . Hypertension   . Hypothyroidism   . PND (post-nasal drip)    Chronic    Patient Active Problem List   Diagnosis Date Noted  . Pneumothorax on left 04/09/2017  . Pleural effusion 04/09/2017  . Pneumothorax 04/09/2017  . Adenocarcinoma, lung ([place])   . Lung cancer ([place]) 10/02/2015  . Hypothyroidism 10/02/2015  . Arthritis of knee 01/06/2014  . Essential hypertension, benign 01/06/2014  . Encounter for therapeutic drug monitoring 12/17/2013  . Pulmonary emboli ([place]) 11/23/2013  . DVT (deep venous thrombosis) ([place]) 11/23/2013  . COPD (chronic obstructive pulmonary disease) with emphysema ([place]) 11/22/2013  . Acute respiratory failure with hypoxia ([place]) 11/22/2013  . Leukocytosis, unspecified 11/22/2013  . Anemia of chronic disease 11/22/2013  . CAP (community acquired  pneumonia) 11/22/2013  . Chronic kidney disease (CKD), stage IV (severe) ([place]) 11/15/2013  . Adenocarcinoma of lung ([place]) 12/11/2012  . Pulmonary nodule 12/03/2012  . HYPOTHYROIDISM 06/19/2007  . HYPERTENSION 06/19/2007    Past Surgical History:  Procedure Laterality Date  . ABDOMINAL HYSTERECTOMY     PARTIAL  . CATARACT EXTRACTION, BILATERAL    . JOINT REPLACEMENT     RIGHT TOTAL KNEE  . RADIATION OF LEFT LUNG    . right lung cancer treated with resection  1982  . right thigh postoperative hematoma  02-2008  . RIGHT UPPER LOBE REMOVED    . THYROID SURGERY    . THYROIDECTOMY    . TOTAL KNEE ARTHROPLASTY     Right  . VESICOVAGINAL FISTULA CLOSURE W/ TAH    . VIDEO BRONCHOSCOPY  12/08/2012   Procedure: VIDEO BRONCHOSCOPY WITH FLUORO;  Surgeon: Kathee Delton, MD;  Location: [facility];  Service: Cardiopulmonary;  Laterality: Bilateral;    OB History    Gravida Para Term Preterm AB Living   0 0 0 0 0     SAB TAB Ectopic Multiple Live Births   0 0 0           Home Medications    Prior to Admission medications   Medication Sig Start Date End Date Taking? Authorizing Provider  acetaminophen-codeine (TYLENOL #3) 300-30 MG tablet Take 1 tablet by mouth  every 4 (four) hours as needed for moderate pain or severe pain.    [provider]  albuterol (PROVENTIL HFA;VENTOLIN HFA) 108 (90 BASE) MCG/ACT inhaler Inhale 2 puffs into the lungs every 6 (six) hours as needed for wheezing or shortness of breath. 11/01/15   Collene Gobble, MD  amLODipine (NORVASC) 5 MG tablet TAKE 1 TABLET (5 MG TOTAL) BY MOUTH AT BEDTIME.    Tresa Garter, MD  atenolol (TENORMIN) 50 MG tablet Take 1 tablet (50 mg total) by mouth daily. 01/06/14   Tresa Garter, MD  cetirizine (ZYRTEC) 10 MG tablet TAKE 1 TABLET (10 MG TOTAL) BY MOUTH AT BEDTIME. 11/06/15   Byrum, Rose Fillers, MD  cholecalciferol (VITAMIN D) 1000 UNITS tablet Take 1,000 Units by mouth daily.    [provider]    guaiFENesin (MUCINEX) 600 MG 12 hr tablet Take 2 tablets (1,200 mg total) by mouth 2 (two) times daily. 10/06/15   Hosie Poisson, MD  levothyroxine (SYNTHROID, LEVOTHROID) 50 MCG tablet Take 50 mcg by mouth daily. 09/12/15   [provider]  pravastatin (PRAVACHOL) 40 MG tablet Take 40 mg by mouth daily. 07/16/15   [provider]    Family History Family History  Problem Relation Age of Onset  . CVA Mother 35       hemorrhagic     Social History Social History  Substance Use Topics  . Smoking status: Former Smoker    Packs/day: 1.00    Years: 35.00    Types: Cigarettes    Quit date: 11/25/1980  . Smokeless tobacco: Never Used  . Alcohol use No     Allergies   Sulfamethoxazole-trimethoprim and Other   Review of Systems Review of Systems  Respiratory: Positive for shortness of breath.   Cardiovascular: Positive for chest pain.  Musculoskeletal: Positive for neck pain.  All other systems reviewed and are negative.    Physical Exam Updated Vital Signs BP (!) 175/68   Pulse 67   Temp 97.6 F (36.4 C) (Oral)   Resp 19   Ht '5\' 4"'$  (1.626 m)   Wt 120 lb (54.4 kg)   SpO2 100%   BMI 20.60 kg/m   Physical Exam  Constitutional: She is oriented to person, place, and time.  Uncomfortable, chronically ill   HENT:  Head: Normocephalic.  Eyes: EOM are normal. Pupils are equal, round, and reactive to light.  Neck: Normal range of motion. Neck supple.  Cardiovascular: Normal rate, regular rhythm and normal heart sounds.   Pulmonary/Chest:  Tachypneic, diminished breath sounds L side   Abdominal: Soft. Bowel sounds are normal. She exhibits no distension. There is no tenderness.  Musculoskeletal: Normal range of motion.  Neurological: She is alert and oriented to person, place, and time. No cranial nerve deficit. Coordination normal.  Skin: Skin is warm.  Psychiatric: She has a normal mood and affect.  Nursing note and vitals reviewed.    ED  Treatments / Results  Labs (all labs ordered are listed, but only abnormal results are displayed) Labs Reviewed  CBC WITH DIFFERENTIAL/PLATELET - Abnormal; Notable for the following:       Result Value   MCV 101.4 (*)    All other components within normal limits  COMPREHENSIVE METABOLIC PANEL - Abnormal; Notable for the following:    Glucose, Bld 109 (*)    Creatinine, Ser 1.29 (*)    ALT 12 (*)    GFR calc non Af Amer 36 (*)    GFR  calc Af Amer 42 (*)    All other components within normal limits  I-STAT CHEM 8, ED - Abnormal; Notable for the following:    BUN 21 (*)    Creatinine, Ser 1.30 (*)    All other components within normal limits  URINALYSIS, ROUTINE W REFLEX MICROSCOPIC  I-STAT TROPOININ, ED    EKG  EKG Interpretation None      ED ECG REPORT I, Wandra Arthurs, the attending physician, personally viewed and interpreted this ECG.   Date: 04/09/2017  EKG Time: 12:24 pm  Rate: 65  Rhythm: normal EKG, normal sinus rhythm  Axis: left  Intervals:none  ST&T Change: nonspecific     Radiology Dg Chest 2 View  Result Date: 04/09/2017 CLINICAL DATA:  Chest pain. EXAM: CHEST  2 VIEW COMPARISON:  No recent prior. FINDINGS: Surgical clips right chest. Low lung volumes with basilar atelectasis. Mild to moderate left apical pneumothorax noted. Associated left density noted. This could be from atelectasis. Left apical mass cannot be excluded. Small bilateral pleural effusions. Mild infiltrate in the right lung base cannot be excluded. Mild nodular density right lung base cannot be excluded. Follow-up chest x-rays recommended demonstrate resolution of these findings. Degenerative changes thoracic spine. IMPRESSION: 1. Mild to moderate left apical pneumothorax noted. Associated apical density is noted. This could be from atelectasis. Left apical mass cannot be excluded. 2. Postsurgical changes right lung. Infiltrate right lung base. Mild nodular density right lung base cannot be  excluded. 3. Small bilateral pleural effusions. Follow-up chest x-rays recommended to demonstrate clearing of these findings. Critical Value/emergent results were called by telephone at the time of interpretation on 04/09/2017 at 1:02 pm to Dr. Shirlyn Goltz , who verbally acknowledged these results. Electronically Signed   By: Marcello Moores  Register   On: 04/09/2017 13:05    Procedures Procedures (including critical care time)  CRITICAL CARE Performed by: Wandra Arthurs   Total critical care time: 30 minutes  Critical care time was exclusive of separately billable procedures and treating other patients.  Critical care was necessary to treat or prevent imminent or life-threatening deterioration.  Critical care was time spent personally by me on the following activities: development of treatment plan with patient and/or surrogate as well as nursing, discussions with consultants, evaluation of patient's response to treatment, examination of patient, obtaining history from patient or surrogate, ordering and performing treatments and interventions, ordering and review of laboratory studies, ordering and review of radiographic studies, pulse oximetry and re-evaluation of patient's condition.   Medications Ordered in ED Medications  loratadine (CLARITIN) tablet 10 mg (not administered)  albuterol (PROVENTIL HFA;VENTOLIN HFA) 108 (90 Base) MCG/ACT inhaler 2 puff (not administered)  acetaminophen-codeine (TYLENOL #3) 300-30 MG per tablet 1 tablet (not administered)  guaiFENesin (MUCINEX) 12 hr tablet 1,200 mg (not administered)  levothyroxine (SYNTHROID, LEVOTHROID) tablet 50 mcg (not administered)  pravastatin (PRAVACHOL) tablet 40 mg (not administered)  amLODipine (NORVASC) tablet 5 mg (not administered)  atenolol (TENORMIN) tablet 50 mg (not administered)  cholecalciferol (VITAMIN D) tablet 1,000 Units (not administered)  sodium chloride flush (NS) 0.9 % injection 3 mL (not administered)  0.9 %  sodium  chloride infusion (not administered)  acetaminophen (TYLENOL) tablet 650 mg (not administered)    Or  acetaminophen (TYLENOL) suppository 650 mg (not administered)  HYDROcodone-acetaminophen (NORCO/VICODIN) 5-325 MG per tablet 1-2 tablet (not administered)  traZODone (DESYREL) tablet 25 mg (not administered)  ondansetron (ZOFRAN) tablet 4 mg (not administered)    Or  ondansetron (ZOFRAN) injection  4 mg (not administered)  sodium chloride 0.9 % bolus 1,000 mL (1,000 mLs Intravenous New Bag/Given 04/09/17 1312)     Initial Impression / Assessment and Plan / ED Course  I have reviewed the triage vital signs and the nursing notes.  Pertinent labs & imaging results that were available during my care of the patient were reviewed by me and considered in my medical decision making (see chart for details).     Valerie Young is a 83 y.o. female here with shortness of breath. Consider recurrent cancer vs pneumonia. Consider PE as well. Will get labs, trop, CXR.   1 pm CXR showed mod L pneumothorax. O2 97% on RA. I called Dr. Roxy Manns, who recommend nasal cannula, observation. No chest tube for now.   2:43 PM Hospitalist to admit. Will order CT as CXR showed possible mass to r/o recurrent cancer.    Final Clinical Impressions(s) / ED Diagnoses   Final diagnoses:  Other pneumothorax    New Prescriptions New Prescriptions   No medications on file     Drenda Freeze, MD 04/09/17 1443

## 2017-04-09 NOTE — Progress Notes (Signed)
ANTICOAGULATION CONSULT NOTE - Initial Consult  Pharmacy Consult for Heparin Indication: pulmonary artery and vein clots  Allergies  Allergen Reactions  . Sulfamethoxazole-Trimethoprim Rash    Patient Measurements: Height: '5\' 4"'$  (162.6 cm) Weight: 120 lb (54.4 kg) IBW/kg (Calculated) : 54.7  Vital Signs: Temp: 97.7 F (36.5 C) (05/16 1605) Temp Source: Oral (05/16 1605) BP: 182/56 (05/16 1741) Pulse Rate: 61 (05/16 1741)  Labs:  Recent Labs  04/09/17 1305 04/09/17 1326  HGB 13.7 14.3  HCT 43.4 42.0  PLT 217  --   CREATININE 1.29* 1.30*    Estimated Creatinine Clearance: 26.7 mL/min (A) (by C-G formula based on SCr of 1.3 mg/dL (H)).   Medical History: Past Medical History:  Diagnosis Date  . 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 Cancer ([place])    right lung ca - 30 years ago / left lung - 11/2012  . Cancer ([place])    Left lung  . CKD (chronic kidney disease)   . COPD (chronic obstructive pulmonary disease) ([place])   . DJD (degenerative joint disease)    knees  . H/O: lung cancer 1982   Right; treated with resection  . Hypertension   . Hypothyroidism   . PND (post-nasal drip)    Chronic    Assessment: 83 year old female to begin heparin for pulmonary artery and vein clots.  She has a history of PE, not on anti-coagulation prior to admission.  Goal of Therapy:  Heparin level 0.3-0.7 units/ml Monitor platelets by anticoagulation protocol: Yes   Plan:  Heparin 2000 units iv bolus x 1 (smaller bolus due to age and renal insufficiency) Heparin drip at 750 units / hr Daily heparin level, CBC  Thank you Anette Guarneri, PharmD [phone]  04/09/2017,5:57 PM

## 2017-04-09 NOTE — ED Notes (Signed)
CT PA at bedsdie

## 2017-04-09 NOTE — ED Triage Notes (Signed)
Pt arrives from home  EMS wth c/o left neck pain radiating to left chest after waking yesterday and worsening with movement, turning head. Significant hx of carotid bockages, hyperlipidemia, and PE.

## 2017-04-09 NOTE — Progress Notes (Addendum)
Dr. Marily Memos notified family at bedside requesting update.  MD called and spoke to pt's son, Abe People, and daughter, April via telephone.  MD discussed POC & CT results with family.  Family verbalized understanding with no additional questions at this time.  Heparin gtt initiated as ordered (see MAR).  Also, PO contrast for CT scan initiated as directed.  Will continue to monitor.

## 2017-04-09 NOTE — Consult Note (Addendum)
Reason for Consult: Left pneumothorax Referring Physician: ER  Valerie Young is an 83 y.o. female.  HPI: The patient is an 84 year old female who presented to the emergency department today with symptoms of increasing shortness of breath. She states the symptoms initially began around 12:30 yesterday but she hoped that they would discontinue better and did not seek attention at that time. She went to sleep approximately 12:30 AM without significant difficulty but when she awoke she found that her symptoms were worse. Primarily she felt as though her chest felt heavy and was associated with shortness of breath. The symptoms would worsen with any kind of activity and were becoming more frequent. She called EMS and presented to the emergency room where she is found to have a small left-sided pneumothorax. She has a previous history of right upper lobectomy for lung cancer approximately 30 years ago resected at [facility] in [place]. Approximately 3 years ago she was found to have a new lesion on the left side and underwent radiation at the oncology center here. In general she feels as though she has been doing quite well recently and has had no significant constitutional symptoms. She actually feels that her energy level has been quite high and she does a lot of work around her home without any difficulty.  Past Medical History:  Diagnosis Date  . 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 Cancer ([place])    right lung ca - 30 years ago / left lung - 11/2012  . Cancer ([place])    Left lung  . COPD (chronic obstructive pulmonary disease) ([place])   . DJD (degenerative joint disease)    knees  . H/O: lung cancer 1982   Right; treated with resection  . Hypertension   . Hypothyroidism   . PND (post-nasal drip)    Chronic    Past Surgical History:  Procedure Laterality Date  . ABDOMINAL HYSTERECTOMY     PARTIAL  . CATARACT EXTRACTION, BILATERAL    . JOINT REPLACEMENT      RIGHT TOTAL KNEE  . RADIATION OF LEFT LUNG    . right lung cancer treated with resection  1982  . right thigh postoperative hematoma  02-2008  . RIGHT UPPER LOBE REMOVED    . THYROID SURGERY    . THYROIDECTOMY    . TOTAL KNEE ARTHROPLASTY     Right  . VESICOVAGINAL FISTULA CLOSURE W/ TAH    . VIDEO BRONCHOSCOPY  12/08/2012   Procedure: VIDEO BRONCHOSCOPY WITH FLUORO;  Surgeon: Kathee Delton, MD;  Location: [facility] END[facility];  Service: Cardiopulmonary;  Laterality: Bilateral;    Family History  Problem Relation Age of Onset  . CVA Mother 67       hemorrhagic     Social History:  reports that she quit smoking about 36 years ago. Her smoking use included Cigarettes. She has a 35.00 pack-year smoking history. She has never used smokeless tobacco. She reports that she does not drink alcohol or use drugs.  Allergies:  Allergies  Allergen Reactions  . Sulfamethoxazole-Trimethoprim     REACTION: rash  . Other Other (See Comments)    Pt states she is allergic to a medication and can not recall what it is     Medications:  Scheduled Meds: . amLODipine  5 mg Oral Daily  . atenolol  50 mg Oral Daily  . cholecalciferol  1,000 Units Oral Daily  . guaiFENesin  1,200 mg Oral BID  . levothyroxine  50 mcg Oral Daily  . loratadine  10 mg Oral Daily  . pravastatin  40 mg Oral Daily  . sodium chloride flush  3 mL Intravenous Q12H   Continuous Infusions: . sodium chloride     PRN Meds:.acetaminophen **OR** acetaminophen, acetaminophen-codeine, albuterol, HYDROcodone-acetaminophen, ondansetron **OR** ondansetron (ZOFRAN) IV, traZODone Results for orders placed or performed during the hospital encounter of 04/09/17 (from the past 48 hour(s))  CBC with Differential/Platelet     Status: Abnormal   Collection Time: 04/09/17  1:05 PM  Result Value Ref Range   WBC 8.3 4.0 - 10.5 K/uL   RBC 4.28 3.87 - 5.11 MIL/uL   Hemoglobin 13.7 12.0 - 15.0 g/dL   HCT 43.4 36.0 - 46.0 %   MCV 101.4 (H) 78.0 -  100.0 fL   MCH 32.0 26.0 - 34.0 pg   MCHC 31.6 30.0 - 36.0 g/dL   RDW 13.0 11.5 - 15.5 %   Platelets 217 150 - 400 K/uL   Neutrophils Relative % 66 %   Neutro Abs 5.5 1.7 - 7.7 K/uL   Lymphocytes Relative 21 %   Lymphs Abs 1.8 0.7 - 4.0 K/uL   Monocytes Relative 10 %   Monocytes Absolute 0.8 0.1 - 1.0 K/uL   Eosinophils Relative 3 %   Eosinophils Absolute 0.2 0.0 - 0.7 K/uL   Basophils Relative 0 %   Basophils Absolute 0.0 0.0 - 0.1 K/uL  Comprehensive metabolic panel     Status: Abnormal   Collection Time: 04/09/17  1:05 PM  Result Value Ref Range   Sodium 140 135 - 145 mmol/L   Potassium 4.0 3.5 - 5.1 mmol/L   Chloride 106 101 - 111 mmol/L   CO2 28 22 - 32 mmol/L   Glucose, Bld 109 (H) 65 - 99 mg/dL   BUN 15 6 - 20 mg/dL   Creatinine, Ser 1.29 (H) 0.44 - 1.00 mg/dL   Calcium 9.7 8.9 - 10.3 mg/dL   Total Protein 6.8 6.5 - 8.1 g/dL   Albumin 4.1 3.5 - 5.0 g/dL   AST 24 15 - 41 U/L   ALT 12 (L) 14 - 54 U/L   Alkaline Phosphatase 76 38 - 126 U/L   Total Bilirubin 0.9 0.3 - 1.2 mg/dL   GFR calc non Af Amer 36 (L) >60 mL/min   GFR calc Af Amer 42 (L) >60 mL/min    Comment: (NOTE) The eGFR has been calculated using the CKD EPI equation. This calculation has not been validated in all clinical situations. eGFR's persistently <60 mL/min signify possible Chronic Kidney Disease.    Anion gap 6 5 - 15  I-stat troponin, ED     Status: None   Collection Time: 04/09/17  1:20 PM  Result Value Ref Range   Troponin i, poc 0.00 0.00 - 0.08 ng/mL   Comment 3            Comment: Due to the release kinetics of cTnI, a negative result within the first hours of the onset of symptoms does not rule out myocardial infarction with certainty. If myocardial infarction is still suspected, repeat the test at appropriate intervals.   I-stat chem 8, ed     Status: Abnormal   Collection Time: 04/09/17  1:26 PM  Result Value Ref Range   Sodium 143 135 - 145 mmol/L   Potassium 4.2 3.5 - 5.1  mmol/L   Chloride 106 101 - 111 mmol/L   BUN 21 (H) 6 - 20 mg/dL  Creatinine, Ser 1.30 (H) 0.44 - 1.00 mg/dL   Glucose, Bld 98 65 - 99 mg/dL   Calcium, Ion 1.20 1.15 - 1.40 mmol/L   TCO2 29 0 - 100 mmol/L   Hemoglobin 14.3 12.0 - 15.0 g/dL   HCT 42.0 36.0 - 46.0 %    Dg Chest 2 View  Result Date: 04/09/2017 CLINICAL DATA:  Chest pain. EXAM: CHEST  2 VIEW COMPARISON:  No recent prior. FINDINGS: Surgical clips right chest. Low lung volumes with basilar atelectasis. Mild to moderate left apical pneumothorax noted. Associated left density noted. This could be from atelectasis. Left apical mass cannot be excluded. Small bilateral pleural effusions. Mild infiltrate in the right lung base cannot be excluded. Mild nodular density right lung base cannot be excluded. Follow-up chest x-rays recommended demonstrate resolution of these findings. Degenerative changes thoracic spine. IMPRESSION: 1. Mild to moderate left apical pneumothorax noted. Associated apical density is noted. This could be from atelectasis. Left apical mass cannot be excluded. 2. Postsurgical changes right lung. Infiltrate right lung base. Mild nodular density right lung base cannot be excluded. 3. Small bilateral pleural effusions. Follow-up chest x-rays recommended to demonstrate clearing of these findings. Critical Value/emergent results were called by telephone at the time of interpretation on 04/09/2017 at 1:02 pm to Dr. Shirlyn Goltz , who verbally acknowledged these results. Electronically Signed   By: Marcello Moores  Register   On: 04/09/2017 13:05    Review of Systems  Constitutional: Positive for diaphoresis and weight loss. Negative for chills, fever and malaise/fatigue.       6-8 lb  Wt loss in past 6 months  HENT: Positive for hearing loss and tinnitus. Negative for congestion, ear discharge, nosebleeds, sinus pain and sore throat.   Eyes: Negative.   Respiratory: Positive for shortness of breath and wheezing. Negative for cough,  hemoptysis, sputum production and stridor.   Cardiovascular: Positive for leg swelling. Negative for chest pain, palpitations, orthopnea, claudication and PND.  Gastrointestinal: Positive for heartburn. Negative for abdominal pain, blood in stool, constipation, diarrhea, melena, nausea and vomiting.  Genitourinary: Positive for frequency and urgency. Negative for dysuria, flank pain and hematuria.  Musculoskeletal: Positive for joint pain. Negative for back pain, falls, myalgias and neck pain.  Skin: Negative.   Neurological: Negative for dizziness, tingling, sensory change, focal weakness, seizures, loss of consciousness, weakness and headaches.  Endo/Heme/Allergies: Positive for environmental allergies. Negative for polydipsia. Bruises/bleeds easily.  Psychiatric/Behavioral: Negative for depression, hallucinations, memory loss, substance abuse and suicidal ideas. The patient has insomnia. The patient is not nervous/anxious.    Blood pressure (!) 176/61, pulse 65, temperature 97.6 F (36.4 C), temperature source Oral, resp. rate (!) 24, height _0  (1.626 m), weight 120 lb (54.4 kg), SpO2 100 %. Physical Exam  Constitutional: She is oriented to person, place, and time. She appears well-developed and well-nourished. No distress.  HENT:  Head: Normocephalic and atraumatic.  Nose: Nose normal.  Mouth/Throat: Oropharynx is clear and moist. No oropharyngeal exudate.  Eyes: Conjunctivae and EOM are normal. Pupils are equal, round, and reactive to light. Right eye exhibits no discharge. Left eye exhibits no discharge. No scleral icterus.  Neck: Normal range of motion. No JVD present. No tracheal deviation present. No thyromegaly present.  Cardiovascular: Normal rate, regular rhythm and normal heart sounds.   No murmur heard. Respiratory: No respiratory distress. She has no wheezes. She has no rales. She exhibits no tenderness.  GI: She exhibits no distension and no mass. There is no tenderness.  There is no rebound and no guarding.  Musculoskeletal: She exhibits no edema or deformity.  Neurological: She is alert and oriented to person, place, and time.  Skin: Skin is warm and dry. No rash noted. She is not diaphoretic. No erythema. No pallor.  Psychiatric: She has a normal mood and affect. Judgment and thought content normal.    Assessment/Plan: The patient has a small left-sided pneumothorax with atelectatic component. A CT scan of the chest is being obtained which hopefully will give further information and certainly with her previous bilateral lung cancer history there could be new potential issue. Currently she is in no significant respiratory distress at rest. She is going to be admitted to the hospitalist service and we will consult.  Patient Active Problem List   Diagnosis Date Noted  . Pneumothorax on left 04/09/2017  . Pleural effusion 04/09/2017  . Pneumothorax 04/09/2017  . Adenocarcinoma, lung ([place])   . Lung cancer ([place]) 10/02/2015  . Hypothyroidism 10/02/2015  . Arthritis of knee 01/06/2014  . Essential hypertension, benign 01/06/2014  . Encounter for therapeutic drug monitoring 12/17/2013  . Pulmonary emboli ([place]) 11/23/2013  . DVT (deep venous thrombosis) ([place]) 11/23/2013  . COPD (chronic obstructive pulmonary disease) with emphysema ([place]) 11/22/2013  . Acute respiratory failure with hypoxia ([place]) 11/22/2013  . Leukocytosis, unspecified 11/22/2013  . Anemia of chronic disease 11/22/2013  . CAP (community acquired pneumonia) 11/22/2013  . Chronic kidney disease (CKD), stage IV (severe) ([place]) 11/15/2013  . Adenocarcinoma of lung ([place]) 12/11/2012  . Pulmonary nodule 12/03/2012  . HYPOTHYROIDISM 06/19/2007  . HYPERTENSION 06/19/2007     GOLD,WAYNE E 04/09/2017, 2:14 PM    CT ANGIOGRAPHY CHEST WITH CONTRAST  TECHNIQUE: Multidetector CT imaging of the chest was performed using the standard protocol during bolus administration of intravenous contrast.  Multiplanar CT image reconstructions and MIPs were obtained to evaluate the vascular anatomy.  CONTRAST:  80 cc of Isovue 370  COMPARISON:  Plain films of earlier today.  No prior CT.  FINDINGS: Cardiovascular: The quality of this exam for evaluation of pulmonary embolism is good. No evidence of pulmonary embolism.  Aortic and branch vessel atherosclerosis. Cardiomegaly, accentuated by a pectus excavatum deformity. Multivessel coronary artery atherosclerosis.  Mediastinum/Nodes: No supraclavicular adenopathy. No mediastinal or hilar adenopathy.  Lungs/Pleura: No pleural fluid. Status post right upper lobectomy. Moderate centrilobular emphysema.  Surgical sutures in the right lower lobe on image 46/series 7. Minimal right lower lobe nodularity anteriorly, likely post infectious or inflammatory. Example image 59/series 7. Area of cavitation in left lower lobe measures maximally 2.5 cm on image 51/series 7. No well-defined soft tissue component.  Medial left upper lobe and apical masslike consolidation may be radiation induced. Concurrent volume loss. Example image 26/series 7.  Upper Abdomen: Normal imaged portions of the liver, spleen, stomach, adrenal glands, kidneys.  Musculoskeletal: No acute osseous abnormality.  Review of the MIP images confirms the above findings.  IMPRESSION: 1.  No evidence of pulmonary embolism. 2. Status post right upper lobectomy. Masslike consolidation within the medial left apex and upper lobe could be radiation induced. Without priors for comparison, residual tumor cannot be excluded. Correlation with prior exams recommended. If no priors available, consider short term CT follow-up or PET. 3. Cavitary focus in left lower lobe may simply represent an area of focal bullous disease. No well-defined soft tissue component. Comparison with priors or short term CT follow-up at 3 months suggested. 4. No thoracic  adenopathy.   Electronically Signed   By: Marylyn Ishihara  Jobe Igo M.D.   On: 12/02/2016 21:51  CT CHEST WITH CONTRAST  TECHNIQUE: Multidetector CT imaging of the chest was performed during intravenous contrast administration.  CONTRAST:  20m ISOVUE-300 IOPAMIDOL (ISOVUE-300) INJECTION 61%  COMPARISON:  No priors.  FINDINGS: Cardiovascular: Heart size is normal. There is no significant pericardial fluid, thickening or pericardial calcification. There is aortic atherosclerosis, as well as atherosclerosis of the great vessels of the mediastinum and the coronary arteries, including calcified atherosclerotic plaque in the left anterior descending, left circumflex and right coronary arteries. Both the left upper lobe pulmonary artery and the left superior pulmonary vein are completely occluded (image 56 of series 3 and image 65 of series 3 respectively).  Mediastinum/Nodes: No pathologically enlarged mediastinal or hilar lymph nodes. Esophagus is unremarkable in appearance. No axillary lymphadenopathy.  Lungs/Pleura: Small left-sided pneumothorax. Trace volume of dependent left pleural effusion. Near complete atelectasis of the left upper lobe which is remarkable for lack of significant internal enhancement, and a somewhat bulbous appearance which measures up to 5.5 x 3.7 cm (axial image 39 of series 3), which would be unusual for a completely atelectatic portion of the lung. Fibrotic changes are noted in the medial aspect of the left lower lobe in the perihilar region, presumably from prior radiation therapy. Status post right upper lobectomy. Multifocal nodularity noted, most evident in the anterior aspect of the right lower lobe where there is extensive peribronchovascular micro and macronodularity, which is most compatible with areas of mucoid impaction within terminal bronchioles. Ovoid shaped 17 x 8 mm ground-glass attenuation nodule in the medial aspect of the right lower  lobe (image 92 of series 5). Diffuse bronchial wall thickening with mild centrilobular and paraseptal emphysema.  Upper Abdomen: Aortic atherosclerosis.  Musculoskeletal: There are no aggressive appearing lytic or blastic lesions noted in the visualized portions of the skeleton.  IMPRESSION: 1. Small left-sided hydropneumothorax, presumably spontaneous related to rupture of a bleb in this patient with underlying emphysema. 2. Near complete atelectasis of the left upper lobe. The left upper lobe is unusual in appearance, more rounded and mass-like than typically seen for simple atelectatic lung. This raises concern for potential neoplasm. Notably, the blood supply to the left upper lobe (with exception of small bronchial artery branches) appears completely compromised, as the left upper lobe pulmonary artery and left superior pulmonary vein are both occluded. 3. Extensive peribronchovascular micro and macronodularity in the lungs, most evident in the anterior aspect of the right lower lobe, likely to reflect areas of mucoid impaction within terminal bronchioles. 4. Mild diffuse bronchial wall thickening with mild centrilobular emphysema. 5. Status post right upper lobectomy. Postradiation changes in the medial aspect of the left lower lobe. 6. Aortic atherosclerosis, in addition to three-vessel coronary artery disease.   Electronically Signed   By: DVinnie LangtonM.D.   On: 04/09/2017 16:07    I have seen and examined the patient and agree with the assessment and plan as outlined.  Patient is an elderly female w/ COPD and lung cancer s/p radiation therapy of curative intent (66 Gy in 30 fractions of 2.2 Gy) to left lung to treat a second primary lung cancer (T2N0M0 adenocarcinoma) followed by Dr. MTammi Klippelin Radiation Oncology and Dr. BLamonte Sakaiin Pulmonary Medicine who just saw her recently on 03/06/2017.  She presented today with a 24 hour history of pleuritic chest  discomfort and dyspnea with activity.  I have personally examined the patient and reviewed her CXR and CT scan which reveal a small left  anterior pneumothorax.  I also reviewed her last previous chest CT performed 12/02/2016 which demonstrates the patient's underlying parenchymal disease and expected volume loss of the LUL due to radiation therapy.  The patient is currently asymptomatic and breathing comfortably on 2 L/min via [place] but she does report mild dyspnea with activity and some pleuritic pain.    I recommend continued conservative therapy and observation for now.  If her pneumothorax gets larger and/or she becomes more symptomatic, left anterior chest tube placement could be performed.  Under the circumstances, this might best be performed by I.R. using CT guidance.  I would recommend contacting Dr. Lamonte Sakai to let him know of her admission and arrange for her to follow up with him within 2 weeks.   I spent in excess of 30 minutes during the conduct of this initial hospital encounter and >50% of this time involved direct face-to-face encounter with the patient for counseling and/or coordination of their care.         Rexene Alberts, MD 04/09/2017 7:45 PM

## 2017-04-10 ENCOUNTER — Encounter (HOSPITAL_COMMUNITY): Payer: Self-pay | Admitting: Family Medicine

## 2017-04-10 ENCOUNTER — Inpatient Hospital Stay (HOSPITAL_COMMUNITY): Payer: Medicare Other

## 2017-04-10 ENCOUNTER — Encounter: Payer: Self-pay | Admitting: *Deleted

## 2017-04-10 DIAGNOSIS — J432 Centrilobular emphysema: Secondary | ICD-10-CM

## 2017-04-10 DIAGNOSIS — I270 Primary pulmonary hypertension: Secondary | ICD-10-CM

## 2017-04-10 DIAGNOSIS — D638 Anemia in other chronic diseases classified elsewhere: Secondary | ICD-10-CM

## 2017-04-10 DIAGNOSIS — I10 Essential (primary) hypertension: Secondary | ICD-10-CM

## 2017-04-10 DIAGNOSIS — C3490 Malignant neoplasm of unspecified part of unspecified bronchus or lung: Secondary | ICD-10-CM

## 2017-04-10 DIAGNOSIS — J939 Pneumothorax, unspecified: Secondary | ICD-10-CM

## 2017-04-10 LAB — COMPREHENSIVE METABOLIC PANEL
ALBUMIN: 3.5 g/dL (ref 3.5–5.0)
ALK PHOS: 64 U/L (ref 38–126)
ALT: 11 U/L — AB (ref 14–54)
AST: 20 U/L (ref 15–41)
Anion gap: 10 (ref 5–15)
BUN: 12 mg/dL (ref 6–20)
CALCIUM: 9 mg/dL (ref 8.9–10.3)
CHLORIDE: 105 mmol/L (ref 101–111)
CO2: 22 mmol/L (ref 22–32)
CREATININE: 1.1 mg/dL — AB (ref 0.44–1.00)
GFR calc Af Amer: 51 mL/min — ABNORMAL LOW (ref >60)
GFR calc non Af Amer: 44 mL/min — ABNORMAL LOW (ref >60)
GLUCOSE: 94 mg/dL (ref 65–99)
Potassium: 3.3 mmol/L — ABNORMAL LOW (ref 3.5–5.1)
SODIUM: 137 mmol/L (ref 135–145)
Total Bilirubin: 0.8 mg/dL (ref 0.3–1.2)
Total Protein: 6.1 g/dL — ABNORMAL LOW (ref 6.5–8.1)

## 2017-04-10 LAB — CBC
HCT: 39.8 % (ref 36.0–46.0)
Hemoglobin: 12.7 g/dL (ref 12.0–15.0)
MCH: 32.2 pg (ref 26.0–34.0)
MCHC: 31.9 g/dL (ref 30.0–36.0)
MCV: 100.8 fL — AB (ref 78.0–100.0)
PLATELETS: 189 10*3/uL (ref 150–400)
RBC: 3.95 MIL/uL (ref 3.87–5.11)
RDW: 13 % (ref 11.5–15.5)
WBC: 9.8 10*3/uL (ref 4.0–10.5)

## 2017-04-10 LAB — HEPARIN LEVEL (UNFRACTIONATED)
HEPARIN UNFRACTIONATED: 0.53 [IU]/mL (ref 0.30–0.70)
Heparin Unfractionated: 0.6 IU/mL (ref 0.30–0.70)

## 2017-04-10 MED ORDER — HYDRALAZINE HCL 20 MG/ML IJ SOLN
5.0000 mg | Freq: Four times a day (QID) | INTRAMUSCULAR | Status: DC | PRN
  Administered 2017-04-10: 5 mg via INTRAVENOUS
  Filled 2017-04-10: qty 1

## 2017-04-10 NOTE — Progress Notes (Signed)
ANTICOAGULATION CONSULT NOTE - Follow-up Consult  Pharmacy Consult for Heparin Indication: pulmonary artery and vein clots  Allergies  Allergen Reactions  . Sulfamethoxazole-Trimethoprim Rash    Patient Measurements: Height: '5\' 4"'$  (162.6 cm) Weight: 120 lb (54.4 kg) IBW/kg (Calculated) : 54.7  Vital Signs: Temp: 98 F (36.7 C) (05/16 2011) Temp Source: Oral (05/16 2011) BP: 157/51 (05/16 2011) Pulse Rate: 58 (05/16 2011)  Labs:  Recent Labs  04/09/17 1305 04/09/17 1326 04/09/17 1752 04/09/17 2226 04/10/17 0247  HGB 13.7 14.3  --   --  12.7  HCT 43.4 42.0  --   --  39.8  PLT 217  --   --   --  189  HEPARINUNFRC  --   --   --   --  0.53  CREATININE 1.29* 1.30*  --   --   --   TROPONINI  --   --  <0.03 <0.03  --     Estimated Creatinine Clearance: 26.7 mL/min (A) (by C-G formula based on SCr of 1.3 mg/dL (H)).   Assessment: 83 year old female on heparin for pulmonary artery and vein clots.  She has a history of PE, not on anti-coagulation prior to admission.  Heparin level therapeutic (0.53) on gtt at 750 units/hr. No bleeding noted. Hgb down a bit.  Goal of Therapy:  Heparin level 0.3-0.7 units/ml Monitor platelets by anticoagulation protocol: Yes   Plan:  Continue heparin drip at 750 units / hr Will f/u 6hr confirmatory heparin level  Sherlon Handing, PharmD, BCPS Clinical pharmacist, pager [phone] 04/10/2017,4:22 AM

## 2017-04-10 NOTE — Progress Notes (Addendum)
[address]       [place] [address]             [phone]         Subjective Breathing comfortably at low level activities  Objective: Vital signs in last 24 hours: Temp:  [97.6 F (36.4 C)-98.1 F (36.7 C)] 98.1 F (36.7 C) (05/17 0451) Pulse Rate:  [58-74] 61 (05/17 0451) Cardiac Rhythm: Sinus bradycardia;Heart block (05/16 1900) Resp:  [12-24] 18 (05/17 0451) BP: (117-202)/(37-78) 117/37 (05/17 0451) SpO2:  [95 %-100 %] 95 % (05/17 0451) Weight:  [120 lb (54.4 kg)-122 lb 8 oz (55.6 kg)] 122 lb 8 oz (55.6 kg) (05/17 0451)  Hemodynamic parameters for last 24 hours:    Intake/Output from previous day: 05/16 0701 - 05/17 0700 In: 585.9 [P.O.:120; I.V.:465.9] Out: -  Intake/Output this shift: No intake/output data recorded.  General appearance: alert, cooperative and no distress Heart: regular rate and rhythm and brady  Lungs: clear to auscultation bilaterally Abdomen: benign  Lab Results:  Recent Labs  04/09/17 1305 04/09/17 1326 04/10/17 0247  WBC 8.3  --  9.8  HGB 13.7 14.3 12.7  HCT 43.4 42.0 39.8  PLT 217  --  189   BMET:  Recent Labs  04/09/17 1305 04/09/17 1326 04/10/17 0247  NA 140 143 137  K 4.0 4.2 3.3*  CL 106 106 105  CO2 28  --  22  GLUCOSE 109* 98 94  BUN 15 21* 12  CREATININE 1.29* 1.30* 1.10*  CALCIUM 9.7  --  9.0    PT/INR: No results for input(s): LABPROT, INR in the last 72 hours. ABG    Component Value Date/Time   PHART 7.447 11/22/2013 0154   HCO3 20.0 11/22/2013 0154   TCO2 29 04/09/2017 1326   ACIDBASEDEF 2.7 (H) 11/22/2013 0154   O2SAT 95.4 11/22/2013 0154   CBG (last 3)  No results for input(s): GLUCAP in the last 72 hours.  Meds Scheduled Meds: . amLODipine  5 mg Oral Daily  . atenolol  50 mg Oral Daily  . levothyroxine  50 mcg Oral QAC breakfast  . loratadine  10 mg Oral Daily  . pravastatin  40 mg Oral Daily  . sodium chloride flush  3 mL Intravenous Q12H   Continuous  Infusions: . sodium chloride 50 mL/hr at 04/09/17 1857  . heparin 750 Units/hr (04/09/17 1857)   PRN Meds:.acetaminophen **OR** acetaminophen, albuterol, HYDROcodone-acetaminophen, ondansetron **OR** ondansetron (ZOFRAN) IV, traZODone  Xrays Dg Chest 2 View  Result Date: 04/09/2017 CLINICAL DATA:  Chest pain. EXAM: CHEST  2 VIEW COMPARISON:  No recent prior. FINDINGS: Surgical clips right chest. Low lung volumes with basilar atelectasis. Mild to moderate left apical pneumothorax noted. Associated left density noted. This could be from atelectasis. Left apical mass cannot be excluded. Small bilateral pleural effusions. Mild infiltrate in the right lung base cannot be excluded. Mild nodular density right lung base cannot be excluded. Follow-up chest x-rays recommended demonstrate resolution of these findings. Degenerative changes thoracic spine. IMPRESSION: 1. Mild to moderate left apical pneumothorax noted. Associated apical density is noted. This could be from atelectasis. Left apical mass cannot be excluded. 2. Postsurgical changes right lung. Infiltrate right lung base. Mild nodular density right lung base cannot be excluded. 3. Small bilateral pleural effusions. Follow-up chest x-rays recommended to demonstrate clearing of these findings. Critical Value/emergent results were called by telephone at the time of interpretation on 04/09/2017 at 1:02 pm to Dr. Shanon Brow  YAO , who verbally acknowledged these results. Electronically Signed   By: Marcello Moores  Register   On: 04/09/2017 13:05   Ct Chest W Contrast  Result Date: 04/09/2017 CLINICAL DATA:  83 year old female with history of left-sided pneumothorax. Possible left apical mass. EXAM: CT CHEST WITH CONTRAST TECHNIQUE: Multidetector CT imaging of the chest was performed during intravenous contrast administration. CONTRAST:  74m ISOVUE-300 IOPAMIDOL (ISOVUE-300) INJECTION 61% COMPARISON:  No priors. FINDINGS: Cardiovascular: Heart size is normal. There is no  significant pericardial fluid, thickening or pericardial calcification. There is aortic atherosclerosis, as well as atherosclerosis of the great vessels of the mediastinum and the coronary arteries, including calcified atherosclerotic plaque in the left anterior descending, left circumflex and right coronary arteries. Both the left upper lobe pulmonary artery and the left superior pulmonary vein are completely occluded (image 56 of series 3 and image 65 of series 3 respectively). Mediastinum/Nodes: No pathologically enlarged mediastinal or hilar lymph nodes. Esophagus is unremarkable in appearance. No axillary lymphadenopathy. Lungs/Pleura: Small left-sided pneumothorax. Trace volume of dependent left pleural effusion. Near complete atelectasis of the left upper lobe which is remarkable for lack of significant internal enhancement, and a somewhat bulbous appearance which measures up to 5.5 x 3.7 cm (axial image 39 of series 3), which would be unusual for a completely atelectatic portion of the lung. Fibrotic changes are noted in the medial aspect of the left lower lobe in the perihilar region, presumably from prior radiation therapy. Status post right upper lobectomy. Multifocal nodularity noted, most evident in the anterior aspect of the right lower lobe where there is extensive peribronchovascular micro and macronodularity, which is most compatible with areas of mucoid impaction within terminal bronchioles. Ovoid shaped 17 x 8 mm ground-glass attenuation nodule in the medial aspect of the right lower lobe (image 92 of series 5). Diffuse bronchial wall thickening with mild centrilobular and paraseptal emphysema. Upper Abdomen: Aortic atherosclerosis. Musculoskeletal: There are no aggressive appearing lytic or blastic lesions noted in the visualized portions of the skeleton. IMPRESSION: 1. Small left-sided hydropneumothorax, presumably spontaneous related to rupture of a bleb in this patient with underlying  emphysema. 2. Near complete atelectasis of the left upper lobe. The left upper lobe is unusual in appearance, more rounded and mass-like than typically seen for simple atelectatic lung. This raises concern for potential neoplasm. Notably, the blood supply to the left upper lobe (with exception of small bronchial artery branches) appears completely compromised, as the left upper lobe pulmonary artery and left superior pulmonary vein are both occluded. 3. Extensive peribronchovascular micro and macronodularity in the lungs, most evident in the anterior aspect of the right lower lobe, likely to reflect areas of mucoid impaction within terminal bronchioles. 4. Mild diffuse bronchial wall thickening with mild centrilobular emphysema. 5. Status post right upper lobectomy. Postradiation changes in the medial aspect of the left lower lobe. 6. Aortic atherosclerosis, in addition to three-vessel coronary artery disease. Electronically Signed   By: DVinnie LangtonM.D.   On: 04/09/2017 16:07   Ct Abdomen Pelvis W Contrast  Result Date: 04/09/2017 CLINICAL DATA:  Possible lung neoplasm EXAM: CT ABDOMEN AND PELVIS WITH CONTRAST TECHNIQUE: Multidetector CT imaging of the abdomen and pelvis was performed using the standard protocol following bolus administration of intravenous contrast. CONTRAST:  75 mL Isovue 300 intravenous COMPARISON:  CT 04/09/2017, 04/07/2015 FINDINGS: Lower chest: Moderate hydropneumothorax at the left base, grossly stable. Nodularity within the right middle lobe as before. Coronary artery calcification. Heart size within normal limits. Hepatobiliary: No  focal hepatic abnormality. No calcified gallstones or biliary dilatation. Pancreas: Slightly prominent pancreatic duct. No inflammatory changes. Spleen: Normal in size without focal abnormality. Adrenals/Urinary Tract: Adrenal glands are within normal limits. Subcentimeter hypodense kidney lesions too small to further characterize. Cortical scarring  within both kidneys. Bladder unremarkable. Stomach/Bowel: Stomach is nonenlarged. No dilated small bowel. No colon wall thickening. Extensive diverticular disease of the sigmoid colon without acute inflammation. Vascular/Lymphatic: Aortic atherosclerosis. No enlarged abdominal or pelvic lymph nodes. Reproductive: Status post hysterectomy. No adnexal masses. Other: No free air or free fluid. Musculoskeletal: No acute or suspicious bone lesions. Degenerative changes. IMPRESSION: 1. Grossly stable moderate size left hydropneumothorax at the left base. Stable nodularity in the right middle lobe. 2. Sigmoid colon diverticular disease without acute inflammation. 3. There are no other significant abnormalities. Electronically Signed   By: Donavan Foil M.D.   On: 04/09/2017 23:09    Assessment/Plan:  1 CXR pending 2  Symptoms are stable 3 hoping to avoid any procedures if possible 4 medical management per primary   / LOS: 1 day    Maui Britten E 04/10/2017    I have seen and examined the patient and agree with the assessment and plan as outlined.  No CXR done this morning.   I would recommend letting Dr. Lamonte Sakai know that his patient is here with a pneumothorax.  Rexene Alberts, MD 04/10/2017 8:26 AM   Addendum: CXR is stable, if xray unchanged tomorrow can probably be discharged with follow -up in 1 week with Dr Lamonte Sakai

## 2017-04-10 NOTE — Progress Notes (Addendum)
PROGRESS NOTE  Valerie Young  YBO:[id number] DOB: [birth year] DOA: 04/09/2017 PCP: Leamon Arnt, MD  Outpatient Specialists: Pulmonology, Dr. Lamonte Sakai; Radiation, Dr. Tammi Klippel  Brief Narrative: Valerie Young is a pleasant 83 y.o. female with a history of right lung NSCLC s/p right upper lobectomy in 1982, then left NSCLC s/p radiation 2014, PE, COPD, HTN, and CKD stage II who presented for 3 days of worsening pleuritic left upper chest pain and dyspnea on exertion. CT chest demonstrated small left hydropneumothorax, left upper lobe mass with significant compromise of blood supply from occlusion of pulmonary artery and vein, and mild centrilobular emphysema. CT surgery was consulted, recommended conservative management. Repeat CXR shows stability of pneumothorax. She has not been hypoxemic. Discussion with oncology, will consult IR for CT-guided biopsy.   Assessment & Plan: Principal Problem:   Pneumothorax Active Problems:   Chronic kidney disease (CKD), stage IV (severe) (HCC)   COPD (chronic obstructive pulmonary disease) with emphysema (HCC)   Anemia of chronic disease   Pulmonary emboli (HCC)   Essential hypertension, benign   Lung cancer (HCC)   Hypothyroidism   Pneumothorax on left   Pleural effusion   Occlusion of left pulmonary artery (HCC)   Neoplasm   Essential hypertension  Left anterior pneumothorax: Repeat CXR this AM shows stability. Likely cause of left upper chest pleuritic pain as troponins negative, ECG nonischemic (sinus brady, 1st deg AVB).  - Monitor oxygen saturation with exertion (SpO2 >90% on room air at rest). Will get up with PT to evaluate exertional symptoms, usually gets around independently with cane. - CT surgery following, hoping to avoid chest tube placement.   Left upper lobe mass: Concerning for CA in pt with h/o NSCLC x2.  - Dr. Lindi Adie recommends CT-guided biopsy, against anticoagulation.  COPD: Emphysematous changes on CT, followed by Dr. Lamonte Sakai.  Currently stable without evidence of exacerbation.  - Continue home medications.  - Pulm notified of admission. Will set up follow up with Dr. Lamonte Sakai.   Essential HTN: Initially hypertensive, 117/37 this AM - Resume home medications  History of lung cancer. Chart review indicates previous history of right upper lobectomy for lung cancer 30 years ago as well as 3 years ago a new lesion on the left side status post radiation. - Discussed with Dr. Lindi Adie 5/17, he will arrange follow up with Dr. Julien Nordmann as an outpatient. Recommended biopsy.   CKD stage II: Based on baseline CrCl >55m/min. SCr 1.3 on admission down to 1.10.  - Monitor. If creatinine drops to 1, would indicate she presented with AKI.  DVT prophylaxis: SCDs, stopped heparin Code Status: DNR Family Communication: None at bedside, pt lives independently Disposition Plan: [place]  Consultants:   Cardiothoracic surgery, Dr. ORoxy Manns Pulmonology, Dr. YNelda Marseilleby phone  Oncology, Dr. GLindi Adieby phone 5/17  Procedures:   None  Antimicrobials:  None   Subjective: Pt with ongoing pain under left clavicle with deep breaths. No dyspnea currently, but has not gotten up.   Objective: Vitals:   04/09/17 1741 04/09/17 1919 04/09/17 2011 04/10/17 0451  BP: (!) 182/56 (!) 175/60 (!) 157/51 (!) 117/37  Pulse: 61 (!) 58 (!) 58 61  Resp:   18 18  Temp:   98 F (36.7 C) 98.1 F (36.7 C)  TempSrc:   Oral Oral  SpO2:   100% 95%  Weight:    55.6 kg (122 lb 8 oz)  Height:        Intake/Output Summary (Last 24 hours)  at 04/10/17 6045 Last data filed at 04/10/17 0300  Gross per 24 hour  Intake           585.88 ml  Output                0 ml  Net           585.88 ml   Filed Weights   04/09/17 1329 04/10/17 0451  Weight: 54.4 kg (120 lb) 55.6 kg (122 lb 8 oz)    Examination: General exam: Alert, oriented elderly female in no distress  Respiratory system: Non-labored breathing room air at rest. Clear to  auscultation bilaterally.  Cardiovascular system: Regular rate and rhythm. No murmur, rub, or gallop. No JVD, and no pedal edema. Gastrointestinal system: Abdomen soft, non-tender, non-distended, with normoactive bowel sounds. No organomegaly or masses felt. Central nervous system: Alert and oriented. No focal neurological deficits. Extremities: Warm, no deformities Skin: No rashes, lesions no ulcers Psychiatry: Judgement and insight appear normal. Mood & affect appropriate.   Data Reviewed: I have personally reviewed following labs and imaging studies  CBC:  Recent Labs Lab 04/09/17 1305 04/09/17 1326 04/10/17 0247  WBC 8.3  --  9.8  NEUTROABS 5.5  --   --   HGB 13.7 14.3 12.7  HCT 43.4 42.0 39.8  MCV 101.4*  --  100.8*  PLT 217  --  409   Basic Metabolic Panel:  Recent Labs Lab 04/09/17 1305 04/09/17 1326 04/10/17 0247  NA 140 143 137  K 4.0 4.2 3.3*  CL 106 106 105  CO2 28  --  22  GLUCOSE 109* 98 94  BUN 15 21* 12  CREATININE 1.29* 1.30* 1.10*  CALCIUM 9.7  --  9.0   GFR: Estimated Creatinine Clearance: 31.7 mL/min (A) (by C-G formula based on SCr of 1.1 mg/dL (H)). Liver Function Tests:  Recent Labs Lab 04/09/17 1305 04/10/17 0247  AST 24 20  ALT 12* 11*  ALKPHOS 76 64  BILITOT 0.9 0.8  PROT 6.8 6.1*  ALBUMIN 4.1 3.5   No results for input(s): LIPASE, AMYLASE in the last 168 hours. No results for input(s): AMMONIA in the last 168 hours. Coagulation Profile: No results for input(s): INR, PROTIME in the last 168 hours. Cardiac Enzymes:  Recent Labs Lab 04/09/17 1752 04/09/17 2226  TROPONINI <0.03 <0.03   BNP (last 3 results) No results for input(s): PROBNP in the last 8760 hours. HbA1C: No results for input(s): HGBA1C in the last 72 hours. CBG: No results for input(s): GLUCAP in the last 168 hours. Lipid Profile: No results for input(s): CHOL, HDL, LDLCALC, TRIG, CHOLHDL, LDLDIRECT in the last 72 hours. Thyroid Function Tests: No results  for input(s): TSH, T4TOTAL, FREET4, T3FREE, THYROIDAB in the last 72 hours. Anemia Panel: No results for input(s): VITAMINB12, FOLATE, FERRITIN, TIBC, IRON, RETICCTPCT in the last 72 hours. Urine analysis:    Component Value Date/Time   COLORURINE COLORLESS (A) 04/09/2017 1510   APPEARANCEUR CLEAR 04/09/2017 1510   LABSPEC 1.005 04/09/2017 1510   PHURINE 7.0 04/09/2017 1510   GLUCOSEU NEGATIVE 04/09/2017 1510   HGBUR NEGATIVE 04/09/2017 1510   BILIRUBINUR NEGATIVE 04/09/2017 1510   KETONESUR NEGATIVE 04/09/2017 1510   PROTEINUR NEGATIVE 04/09/2017 1510   UROBILINOGEN 0.2 04/07/2015 0339   NITRITE NEGATIVE 04/09/2017 1510   LEUKOCYTESUR NEGATIVE 04/09/2017 1510   No results found for this or any previous visit (from the past 240 hour(s)).    Radiology Studies: Dg Chest 2 View  Result Date:  04/09/2017 CLINICAL DATA:  Chest pain. EXAM: CHEST  2 VIEW COMPARISON:  No recent prior. FINDINGS: Surgical clips right chest. Low lung volumes with basilar atelectasis. Mild to moderate left apical pneumothorax noted. Associated left density noted. This could be from atelectasis. Left apical mass cannot be excluded. Small bilateral pleural effusions. Mild infiltrate in the right lung base cannot be excluded. Mild nodular density right lung base cannot be excluded. Follow-up chest x-rays recommended demonstrate resolution of these findings. Degenerative changes thoracic spine. IMPRESSION: 1. Mild to moderate left apical pneumothorax noted. Associated apical density is noted. This could be from atelectasis. Left apical mass cannot be excluded. 2. Postsurgical changes right lung. Infiltrate right lung base. Mild nodular density right lung base cannot be excluded. 3. Small bilateral pleural effusions. Follow-up chest x-rays recommended to demonstrate clearing of these findings. Critical Value/emergent results were called by telephone at the time of interpretation on 04/09/2017 at 1:02 pm to Dr. Shirlyn Goltz , who  verbally acknowledged these results. Electronically Signed   By: Marcello Moores  Register   On: 04/09/2017 13:05   Ct Chest W Contrast  Result Date: 04/09/2017 CLINICAL DATA:  83 year old female with history of left-sided pneumothorax. Possible left apical mass. EXAM: CT CHEST WITH CONTRAST TECHNIQUE: Multidetector CT imaging of the chest was performed during intravenous contrast administration. CONTRAST:  72m ISOVUE-300 IOPAMIDOL (ISOVUE-300) INJECTION 61% COMPARISON:  No priors. FINDINGS: Cardiovascular: Heart size is normal. There is no significant pericardial fluid, thickening or pericardial calcification. There is aortic atherosclerosis, as well as atherosclerosis of the great vessels of the mediastinum and the coronary arteries, including calcified atherosclerotic plaque in the left anterior descending, left circumflex and right coronary arteries. Both the left upper lobe pulmonary artery and the left superior pulmonary vein are completely occluded (image 56 of series 3 and image 65 of series 3 respectively). Mediastinum/Nodes: No pathologically enlarged mediastinal or hilar lymph nodes. Esophagus is unremarkable in appearance. No axillary lymphadenopathy. Lungs/Pleura: Small left-sided pneumothorax. Trace volume of dependent left pleural effusion. Near complete atelectasis of the left upper lobe which is remarkable for lack of significant internal enhancement, and a somewhat bulbous appearance which measures up to 5.5 x 3.7 cm (axial image 39 of series 3), which would be unusual for a completely atelectatic portion of the lung. Fibrotic changes are noted in the medial aspect of the left lower lobe in the perihilar region, presumably from prior radiation therapy. Status post right upper lobectomy. Multifocal nodularity noted, most evident in the anterior aspect of the right lower lobe where there is extensive peribronchovascular micro and macronodularity, which is most compatible with areas of mucoid impaction  within terminal bronchioles. Ovoid shaped 17 x 8 mm ground-glass attenuation nodule in the medial aspect of the right lower lobe (image 92 of series 5). Diffuse bronchial wall thickening with mild centrilobular and paraseptal emphysema. Upper Abdomen: Aortic atherosclerosis. Musculoskeletal: There are no aggressive appearing lytic or blastic lesions noted in the visualized portions of the skeleton. IMPRESSION: 1. Small left-sided hydropneumothorax, presumably spontaneous related to rupture of a bleb in this patient with underlying emphysema. 2. Near complete atelectasis of the left upper lobe. The left upper lobe is unusual in appearance, more rounded and mass-like than typically seen for simple atelectatic lung. This raises concern for potential neoplasm. Notably, the blood supply to the left upper lobe (with exception of small bronchial artery branches) appears completely compromised, as the left upper lobe pulmonary artery and left superior pulmonary vein are both occluded. 3. Extensive peribronchovascular micro  and macronodularity in the lungs, most evident in the anterior aspect of the right lower lobe, likely to reflect areas of mucoid impaction within terminal bronchioles. 4. Mild diffuse bronchial wall thickening with mild centrilobular emphysema. 5. Status post right upper lobectomy. Postradiation changes in the medial aspect of the left lower lobe. 6. Aortic atherosclerosis, in addition to three-vessel coronary artery disease. Electronically Signed   By: Vinnie Langton M.D.   On: 04/09/2017 16:07   Ct Abdomen Pelvis W Contrast  Result Date: 04/09/2017 CLINICAL DATA:  Possible lung neoplasm EXAM: CT ABDOMEN AND PELVIS WITH CONTRAST TECHNIQUE: Multidetector CT imaging of the abdomen and pelvis was performed using the standard protocol following bolus administration of intravenous contrast. CONTRAST:  75 mL Isovue 300 intravenous COMPARISON:  CT 04/09/2017, 04/07/2015 FINDINGS: Lower chest: Moderate  hydropneumothorax at the left base, grossly stable. Nodularity within the right middle lobe as before. Coronary artery calcification. Heart size within normal limits. Hepatobiliary: No focal hepatic abnormality. No calcified gallstones or biliary dilatation. Pancreas: Slightly prominent pancreatic duct. No inflammatory changes. Spleen: Normal in size without focal abnormality. Adrenals/Urinary Tract: Adrenal glands are within normal limits. Subcentimeter hypodense kidney lesions too small to further characterize. Cortical scarring within both kidneys. Bladder unremarkable. Stomach/Bowel: Stomach is nonenlarged. No dilated small bowel. No colon wall thickening. Extensive diverticular disease of the sigmoid colon without acute inflammation. Vascular/Lymphatic: Aortic atherosclerosis. No enlarged abdominal or pelvic lymph nodes. Reproductive: Status post hysterectomy. No adnexal masses. Other: No free air or free fluid. Musculoskeletal: No acute or suspicious bone lesions. Degenerative changes. IMPRESSION: 1. Grossly stable moderate size left hydropneumothorax at the left base. Stable nodularity in the right middle lobe. 2. Sigmoid colon diverticular disease without acute inflammation. 3. There are no other significant abnormalities. Electronically Signed   By: Donavan Foil M.D.   On: 04/09/2017 23:09   Dg Chest Port 1 View  Result Date: 04/10/2017 CLINICAL DATA:  Follow-up left-sided pneumothorax and left upper lobe atelectasis or mass EXAM: PORTABLE CHEST 1 VIEW COMPARISON:  CT scan of the chest and PA and lateral chest x-ray of Apr 09, 2017 FINDINGS: There is persistent abnormal density in the left upper lobe with surrounding pneumothorax. There is no significant pleural effusion on the left today. On the right the lung is well-expanded and clear. There is stable pleural thickening in the right apex. The heart and pulmonary vascularity are normal. There is calcification in the wall of the aortic arch. There is  surgical suture material in the right suprahilar region for previous right upper lobectomy. The observed bony thorax exhibits no acute abnormality. IMPRESSION: Stable approximately 15 to 20% left pneumothorax. No significant pleural fluid is observed today. Persistent left upper lobe masslike density. Stable chronic bronchitic and postsurgical changes in the right lung. Subtle nodularity at the right lung base persists as well. No CHF. Thoracic aortic atherosclerosis. Electronically Signed   By: David  Martinique M.D.   On: 04/10/2017 08:34    Scheduled Meds: . amLODipine  5 mg Oral Daily  . atenolol  50 mg Oral Daily  . levothyroxine  50 mcg Oral QAC breakfast  . loratadine  10 mg Oral Daily  . pravastatin  40 mg Oral Daily  . sodium chloride flush  3 mL Intravenous Q12H   Continuous Infusions: . sodium chloride 50 mL/hr at 04/09/17 1857  . heparin 750 Units/hr (04/09/17 1857)     LOS: 1 day   Time spent: 25 minutes.  Vance Gather, MD [facility] Pager [phone]  If 7PM-7AM, please contact night-coverage [email or web address] Password [facility] 04/10/2017, 9:37 AM

## 2017-04-10 NOTE — Progress Notes (Signed)
Oncology Nurse Navigator Documentation  Oncology Nurse Navigator Flowsheets 04/10/2017  Navigator Location [facility]  Referral date to RadOnc/MedOnc 04/10/2017  Navigator Encounter Type Other/I received referral on Valerie Young today.  I updated new patient coordinator to call patient to see Dr. Julien Nordmann on 04/18/17 at 10:15.   Treatment Phase Pre-Tx/Tx Discussion  Barriers/Navigation Needs Coordination of Care  Interventions Coordination of Care  Coordination of Care Appts  Acuity Level 2  Time Spent with Patient 30

## 2017-04-10 NOTE — Progress Notes (Signed)
ANTICOAGULATION CONSULT NOTE - Follow-up Consult  Pharmacy Consult for Heparin Indication: pulmonary artery and vein clots  Allergies  Allergen Reactions  . Sulfamethoxazole-Trimethoprim Rash    Patient Measurements: Height: '5\' 4"'$  (162.6 cm) Weight: 122 lb 8 oz (55.6 kg) IBW/kg (Calculated) : 54.7  Vital Signs: Temp: 98.1 F (36.7 C) (05/17 0451) Temp Source: Oral (05/17 0451) BP: 117/37 (05/17 0451) Pulse Rate: 61 (05/17 0451)  Labs:  Recent Labs  04/09/17 1305 04/09/17 1326 04/09/17 1752 04/09/17 2226 04/10/17 0247 04/10/17 0946  HGB 13.7 14.3  --   --  12.7  --   HCT 43.4 42.0  --   --  39.8  --   PLT 217  --   --   --  189  --   HEPARINUNFRC  --   --   --   --  0.53 0.60  CREATININE 1.29* 1.30*  --   --  1.10*  --   TROPONINI  --   --  <0.03 <0.03  --   --     Estimated Creatinine Clearance: 31.7 mL/min (A) (by C-G formula based on SCr of 1.1 mg/dL (H)).   Assessment: 83 year old female on heparin for possible pulmonary artery and vein clots (CT with upper lobe mass).  She has a history of PE and lung cancer, not on anti-coagulation prior to admission. -heparin level= 0.6 on 750 units/hr  Goal of Therapy:  Heparin level 0.3-0.7 units/ml Monitor platelets by anticoagulation protocol: Yes   Plan:  Continue heparin drip at 750 units / hr Daily heparin level and CBC  Hildred Laser, Pharm D 04/10/2017 11:28 AM

## 2017-04-10 NOTE — Progress Notes (Signed)
Patient ID: Valerie Young, female   DOB: 1928-12-02, 83 y.o.   MRN: [id number]  Interventional Radiology:  Spoke with Dr. Bonner Puna regarding biopsy of LUL.  In reviewing imaging, there has been chronic atelectasis and collapse of the LUL dating back to 2015 w/ near identical appearance since 02/28/2015 scan.  Doubtful that this represents malignancy.  Decreased vascularity likely relates to chronic collapse with non-functioning lung.  No indication for percutaneous biopsy.  Dr. Lamonte Sakai has followed this patient long term in past.  Could consider PET if concerned about malignancy and bronchoscopy if collapsed LUL is hypermetabolic. Now will also be followed by Dr. Julien Nordmann as an outpatient.  [name]. [name], M.D Pager:  [phone]

## 2017-04-11 ENCOUNTER — Inpatient Hospital Stay (HOSPITAL_COMMUNITY): Payer: Medicare Other

## 2017-04-11 ENCOUNTER — Telehealth: Payer: Self-pay | Admitting: Internal Medicine

## 2017-04-11 ENCOUNTER — Encounter: Payer: Self-pay | Admitting: *Deleted

## 2017-04-11 DIAGNOSIS — J90 Pleural effusion, not elsewhere classified: Secondary | ICD-10-CM

## 2017-04-11 DIAGNOSIS — J9383 Other pneumothorax: Principal | ICD-10-CM

## 2017-04-11 DIAGNOSIS — R918 Other nonspecific abnormal finding of lung field: Secondary | ICD-10-CM

## 2017-04-11 DIAGNOSIS — C3492 Malignant neoplasm of unspecified part of left bronchus or lung: Secondary | ICD-10-CM

## 2017-04-11 LAB — BASIC METABOLIC PANEL
Anion gap: 10 (ref 5–15)
BUN: 12 mg/dL (ref 6–20)
CHLORIDE: 107 mmol/L (ref 101–111)
CO2: 23 mmol/L (ref 22–32)
CREATININE: 1.13 mg/dL — AB (ref 0.44–1.00)
Calcium: 9.4 mg/dL (ref 8.9–10.3)
GFR calc non Af Amer: 43 mL/min — ABNORMAL LOW (ref >60)
GFR, EST AFRICAN AMERICAN: 50 mL/min — AB (ref >60)
Glucose, Bld: 119 mg/dL — ABNORMAL HIGH (ref 65–99)
Potassium: 3.4 mmol/L — ABNORMAL LOW (ref 3.5–5.1)
SODIUM: 140 mmol/L (ref 135–145)

## 2017-04-11 LAB — CBC
HEMATOCRIT: 37.4 % (ref 36.0–46.0)
Hemoglobin: 11.6 g/dL — ABNORMAL LOW (ref 12.0–15.0)
MCH: 30.9 pg (ref 26.0–34.0)
MCHC: 31 g/dL (ref 30.0–36.0)
MCV: 99.5 fL (ref 78.0–100.0)
Platelets: 201 10*3/uL (ref 150–400)
RBC: 3.76 MIL/uL — ABNORMAL LOW (ref 3.87–5.11)
RDW: 12.8 % (ref 11.5–15.5)
WBC: 12.3 10*3/uL — ABNORMAL HIGH (ref 4.0–10.5)

## 2017-04-11 LAB — PROTIME-INR
INR: 1.11
Prothrombin Time: 14.4 seconds (ref 11.4–15.2)

## 2017-04-11 LAB — APTT: aPTT: 30 seconds (ref 24–36)

## 2017-04-11 MED ORDER — HYDROCODONE-ACETAMINOPHEN 5-325 MG PO TABS: 0.5000 | ORAL_TABLET | Freq: Four times a day (QID) | ORAL | 0 refills | Status: DC | PRN

## 2017-04-11 NOTE — Telephone Encounter (Signed)
Per 5/18 schedule message disregard previous message patient scheduled for [place] and contacted.

## 2017-04-11 NOTE — Progress Notes (Signed)
Oncology Nurse Navigator Documentation  Oncology Nurse Navigator Flowsheets 04/11/2017  Navigator Location [facility]  Navigator Encounter Type Other;Telephone/I called [facility] 2W to updated nurse on appt for patient.  Valerie Young will get more information in the mail about appt.   Treatment Phase Pre-Tx/Tx Discussion  Barriers/Navigation Needs Coordination of Care  Interventions Coordination of Care  Coordination of Care Appts  Acuity Level 2  Time Spent with Patient 30

## 2017-04-11 NOTE — Progress Notes (Signed)
      [address]       [place] [address]             [phone]      Subjective:  No complaints.  Denies chest pain and shortness of breath.   Objective: Vital signs in last 24 hours: Temp:  [98.2 F (36.8 C)-98.6 F (37 C)] 98.6 F (37 C) (05/18 0604) Pulse Rate:  [58-67] 58 (05/18 0604) Cardiac Rhythm: Heart block (05/18 0700) Resp:  [14-20] 14 (05/18 0604) BP: (115-176)/(39-66) 115/39 (05/18 0604) SpO2:  [95 %-98 %] 95 % (05/18 0604) Weight:  [122 lb (55.3 kg)] 122 lb (55.3 kg) (05/18 0600)  Intake/Output from previous day: 05/17 0701 - 05/18 0700 In: 820 [P.O.:820] Out: 1250 [Urine:1250]  General appearance: alert, cooperative and no distress Heart: regular rate and rhythm Lungs: clear to auscultation bilaterally Abdomen: soft, non-tender; bowel sounds normal; no masses,  no organomegaly  Lab Results:  Recent Labs  04/10/17 0247 04/11/17 0348  WBC 9.8 12.3*  HGB 12.7 11.6*  HCT 39.8 37.4  PLT 189 201   BMET:  Recent Labs  04/10/17 0247 04/11/17 0348  NA 137 140  K 3.3* 3.4*  CL 105 107  CO2 22 23  GLUCOSE 94 119*  BUN 12 12  CREATININE 1.10* 1.13*  CALCIUM 9.0 9.4    PT/INR:  Recent Labs  04/11/17 0348  LABPROT 14.4  INR 1.11   ABG    Component Value Date/Time   PHART 7.447 11/22/2013 0154   HCO3 20.0 11/22/2013 0154   TCO2 29 04/09/2017 1326   ACIDBASEDEF 2.7 (H) 11/22/2013 0154   O2SAT 95.4 11/22/2013 0154   CBG (last 3)  No results for input(s): GLUCAP in the last 72 hours.  Assessment/Plan:  1. Left Pneumothorax- stable in appearance 2. Dispo- patient can be d/c from our standpoint, patient will need to follow up with Dr. Lamonte Sakai in 2 weeks with repeat CXR and further management of Pneumothorax   LOS: 2 days    Ellwood Handler 04/11/2017

## 2017-04-11 NOTE — Discharge Summary (Signed)
Physician Discharge Summary  Valerie Young MGQ:[id number] DOB: Aug 23, 1929 DOA: 04/09/2017  PCP: Leamon Arnt, MD  Admit date: 04/09/2017 Discharge date: 04/11/2017  Admitted From: Home Disposition: Home   Recommendations for Outpatient Follow-up:  1. Follow up with Pulmonology, Dr. Lamonte Sakai with repeat CXR in next 2 weeks.  2. Could consider PET if concerned about malignancy in LUL and bronchoscopy if collapsed LUL is hypermetabolic. 3. Patient to follow up with Dr. Julien Nordmann as well.  Home Health: None Equipment/Devices: None Discharge Condition: Stable CODE STATUS: DNR Diet recommendation: Heart healthy  Brief/Interim Summary: Valerie A Binghamis a pleasant 83 y.o.femalewith a history of right lung NSCLC s/p right upper lobectomy in 1982, then left NSCLC s/p radiation 2014, PE, COPD, HTN, and CKD stage II who presented for 3 days of worsening pleuritic left upper chest pain and dyspnea on exertion. CT chest demonstrated small left hydropneumothorax, left upper lobe mass with significant compromise of blood supply from occlusion of pulmonary artery and vein, and mild centrilobular emphysema. CT surgery was consulted, recommended conservative management. Repeat CXR shows stability of pneumothorax. She has not been hypoxemic, and is felt stable for discharge with pulmonology follow up.  Discharge Diagnoses:  Principal Problem:   Pneumothorax Active Problems:   Chronic kidney disease (CKD), stage IV (severe) (HCC)   COPD (chronic obstructive pulmonary disease) with emphysema (HCC)   Anemia of chronic disease   Pulmonary emboli (HCC)   Lung cancer (HCC)   Hypothyroidism   Pneumothorax on left   Pleural effusion   Occlusion of left pulmonary artery (HCC)   Neoplasm   Essential hypertension  Left anterior pneumothorax: Repeat CXR this AM shows stability of 15-20% PTX. Likely cause of left upper chest pleuritic pain as troponins negative, ECG nonischemic (sinus brady, 1st deg AVB).  -  No dyspnea or hypoxia when moving around.   - CT surgery following, also discussed with IR Re: placing tube. This does not seem to be necessary.   Left upper lobe mass: Concerning for CA in pt with h/o NSCLC x2, though this is very stable in appearance from prior scans - Discussed with Dr. Lindi Adie, will have pt follow up with Dr. Julien Nordmann. IR deferred biopsy at this time and recommended consideration of PET. - No indication for anticoagulation  COPD: Emphysematous changes on CT, followed by Dr. Lamonte Sakai. Currently stable without evidence of exacerbation.  - Continue home medications.  - Pulm notified of admission. Will set up follow up with Dr. Lamonte Sakai.   Essential HTN: Initially hypertensive, 117/37 this AM - Resume home medications  History of lung cancer. Chart review indicates previous history of right upper lobectomy for lung cancer 30 years ago as well as 3 years ago a new lesion on the left side status post radiation. - Discussed with Dr. Lindi Adie 5/17, he will arrange follow up with Dr. Julien Nordmann as an outpatient. Recommended biopsy.   CKD stage II: Based on baseline CrCl >70m/min. SCr 1.3 on admission down to 1.10.  - Monitor. Did not have AKI.  Discharge Instructions Discharge Instructions    Discharge instructions    Complete by:  As directed    You were admitted for observation of a collapsed lung which has remained stable. You will need to follow up with Dr. BLamonte Sakaias scheduled for recheck. You will also be following up with oncology, Dr. MJulien Nordmannas scheduled, as the mass in the left lung may or may not be cancerous.  - Continue all medications, and take vicodin 0.5 -  1 tablets as needed for the left upper chest pain.  - If you get more short of breath or the chest pain worsens, seek medical attention right away.     Allergies as of 04/11/2017      Reactions   Sulfamethoxazole-trimethoprim Rash      Medication List    TAKE these medications   acetaminophen 325 MG  tablet Commonly known as:  TYLENOL Take 650 mg by mouth See admin instructions. TWO TO THREE TIMES A DAY   albuterol 108 (90 Base) MCG/ACT inhaler Commonly known as:  PROVENTIL HFA;VENTOLIN HFA Inhale 2 puffs into the lungs every 6 (six) hours as needed for wheezing or shortness of breath.   amLODipine 5 MG tablet Commonly known as:  NORVASC TAKE 1 TABLET (5 MG TOTAL) BY MOUTH AT BEDTIME. What changed:  See the new instructions.   atenolol 50 MG tablet Commonly known as:  TENORMIN Take 1 tablet (50 mg total) by mouth daily.   cetirizine 10 MG tablet Commonly known as:  ZYRTEC TAKE 1 TABLET (10 MG TOTAL) BY MOUTH AT BEDTIME. What changed:  See the new instructions.   guaiFENesin 600 MG 12 hr tablet Commonly known as:  MUCINEX Take 2 tablets (1,200 mg total) by mouth 2 (two) times daily.   HYDROcodone-acetaminophen 5-325 MG tablet Commonly known as:  NORCO/VICODIN Take 0.5-1 tablets by mouth every 6 (six) hours as needed for moderate pain.   levothyroxine 50 MCG tablet Commonly known as:  SYNTHROID, LEVOTHROID Take 50 mcg by mouth daily before breakfast.   pravastatin 40 MG tablet Commonly known as:  PRAVACHOL Take 40 mg by mouth daily.   Vitamin D-3 1000 units Caps Take 3,000 Units by mouth daily.      Follow-up Information    Curt Bears, MD Follow up on 04/18/2017.   Specialty:  Oncology Why:  10:15am Contact information: [place] [address] [phone]        Collene Gobble, MD Follow up on 04/29/2017.   Specialty:  Pulmonary Disease Why:  Appointment is at 2:30, please arrive at 2:00 to have CXR completed prior to appointment Contact information: [address] [place] [address] [phone]          Allergies  Allergen Reactions  . Sulfamethoxazole-Trimethoprim Rash    Consultations:  CT surgery, Dr. Derrek Monaco, Dr. Nelda Marseille by phone only  IR, Dr. Armond Hang, Dr. Lindi Adie by phone  only.  Procedures/Studies: Dg Chest 2 View  Result Date: 04/11/2017 CLINICAL DATA:  Pneumothorax EXAM: CHEST  2 VIEW COMPARISON:  Apr 10, 2017 chest radiograph and chest CT Apr 09, 2017 FINDINGS: There is a persistent left apical and apicolateral pneumothorax, unchanged from recent studies. There is consolidation involving much of the left upper lobe posteriorly, stable. There is a small pleural effusion on the left. There are postoperative changes on the right. There is scarring in the right apex. There is also scarring in the right base. There is no edema or consolidation on the right. The heart size is normal. Pulmonary vascularity is stable and reflects the underlying postoperative change as well as the consolidation in the left upper lobe and pneumothorax. There is aortic atherosclerosis. No adenopathy evident. No bone lesions. IMPRESSION: Persistent pneumothorax on the left without appreciable change. There is consolidation and volume loss of much of the left upper lobe. There is a new small left pleural effusion. Areas of scarring are stable on the right. No new consolidation  or volume loss. Stable cardiac silhouette. There is aortic atherosclerosis. Electronically Signed   By: Lowella Grip III M.D.   On: 04/11/2017 07:59   Dg Chest 2 View  Result Date: 04/09/2017 CLINICAL DATA:  Chest pain. EXAM: CHEST  2 VIEW COMPARISON:  No recent prior. FINDINGS: Surgical clips right chest. Low lung volumes with basilar atelectasis. Mild to moderate left apical pneumothorax noted. Associated left density noted. This could be from atelectasis. Left apical mass cannot be excluded. Small bilateral pleural effusions. Mild infiltrate in the right lung base cannot be excluded. Mild nodular density right lung base cannot be excluded. Follow-up chest x-rays recommended demonstrate resolution of these findings. Degenerative changes thoracic spine. IMPRESSION: 1. Mild to moderate left apical pneumothorax noted.  Associated apical density is noted. This could be from atelectasis. Left apical mass cannot be excluded. 2. Postsurgical changes right lung. Infiltrate right lung base. Mild nodular density right lung base cannot be excluded. 3. Small bilateral pleural effusions. Follow-up chest x-rays recommended to demonstrate clearing of these findings. Critical Value/emergent results were called by telephone at the time of interpretation on 04/09/2017 at 1:02 pm to Dr. Shirlyn Goltz , who verbally acknowledged these results. Electronically Signed   By: Marcello Moores  Register   On: 04/09/2017 13:05   Ct Chest W Contrast  Result Date: 04/09/2017 CLINICAL DATA:  83 year old female with history of left-sided pneumothorax. Possible left apical mass. EXAM: CT CHEST WITH CONTRAST TECHNIQUE: Multidetector CT imaging of the chest was performed during intravenous contrast administration. CONTRAST:  96m ISOVUE-300 IOPAMIDOL (ISOVUE-300) INJECTION 61% COMPARISON:  No priors. FINDINGS: Cardiovascular: Heart size is normal. There is no significant pericardial fluid, thickening or pericardial calcification. There is aortic atherosclerosis, as well as atherosclerosis of the great vessels of the mediastinum and the coronary arteries, including calcified atherosclerotic plaque in the left anterior descending, left circumflex and right coronary arteries. Both the left upper lobe pulmonary artery and the left superior pulmonary vein are completely occluded (image 56 of series 3 and image 65 of series 3 respectively). Mediastinum/Nodes: No pathologically enlarged mediastinal or hilar lymph nodes. Esophagus is unremarkable in appearance. No axillary lymphadenopathy. Lungs/Pleura: Small left-sided pneumothorax. Trace volume of dependent left pleural effusion. Near complete atelectasis of the left upper lobe which is remarkable for lack of significant internal enhancement, and a somewhat bulbous appearance which measures up to 5.5 x 3.7 cm (axial image 39 of  series 3), which would be unusual for a completely atelectatic portion of the lung. Fibrotic changes are noted in the medial aspect of the left lower lobe in the perihilar region, presumably from prior radiation therapy. Status post right upper lobectomy. Multifocal nodularity noted, most evident in the anterior aspect of the right lower lobe where there is extensive peribronchovascular micro and macronodularity, which is most compatible with areas of mucoid impaction within terminal bronchioles. Ovoid shaped 17 x 8 mm ground-glass attenuation nodule in the medial aspect of the right lower lobe (image 92 of series 5). Diffuse bronchial wall thickening with mild centrilobular and paraseptal emphysema. Upper Abdomen: Aortic atherosclerosis. Musculoskeletal: There are no aggressive appearing lytic or blastic lesions noted in the visualized portions of the skeleton. IMPRESSION: 1. Small left-sided hydropneumothorax, presumably spontaneous related to rupture of a bleb in this patient with underlying emphysema. 2. Near complete atelectasis of the left upper lobe. The left upper lobe is unusual in appearance, more rounded and mass-like than typically seen for simple atelectatic lung. This raises concern for potential neoplasm. Notably, the blood supply  to the left upper lobe (with exception of small bronchial artery branches) appears completely compromised, as the left upper lobe pulmonary artery and left superior pulmonary vein are both occluded. 3. Extensive peribronchovascular micro and macronodularity in the lungs, most evident in the anterior aspect of the right lower lobe, likely to reflect areas of mucoid impaction within terminal bronchioles. 4. Mild diffuse bronchial wall thickening with mild centrilobular emphysema. 5. Status post right upper lobectomy. Postradiation changes in the medial aspect of the left lower lobe. 6. Aortic atherosclerosis, in addition to three-vessel coronary artery disease. Electronically  Signed   By: Vinnie Langton M.D.   On: 04/09/2017 16:07   Ct Abdomen Pelvis W Contrast  Result Date: 04/09/2017 CLINICAL DATA:  Possible lung neoplasm EXAM: CT ABDOMEN AND PELVIS WITH CONTRAST TECHNIQUE: Multidetector CT imaging of the abdomen and pelvis was performed using the standard protocol following bolus administration of intravenous contrast. CONTRAST:  75 mL Isovue 300 intravenous COMPARISON:  CT 04/09/2017, 04/07/2015 FINDINGS: Lower chest: Moderate hydropneumothorax at the left base, grossly stable. Nodularity within the right middle lobe as before. Coronary artery calcification. Heart size within normal limits. Hepatobiliary: No focal hepatic abnormality. No calcified gallstones or biliary dilatation. Pancreas: Slightly prominent pancreatic duct. No inflammatory changes. Spleen: Normal in size without focal abnormality. Adrenals/Urinary Tract: Adrenal glands are within normal limits. Subcentimeter hypodense kidney lesions too small to further characterize. Cortical scarring within both kidneys. Bladder unremarkable. Stomach/Bowel: Stomach is nonenlarged. No dilated small bowel. No colon wall thickening. Extensive diverticular disease of the sigmoid colon without acute inflammation. Vascular/Lymphatic: Aortic atherosclerosis. No enlarged abdominal or pelvic lymph nodes. Reproductive: Status post hysterectomy. No adnexal masses. Other: No free air or free fluid. Musculoskeletal: No acute or suspicious bone lesions. Degenerative changes. IMPRESSION: 1. Grossly stable moderate size left hydropneumothorax at the left base. Stable nodularity in the right middle lobe. 2. Sigmoid colon diverticular disease without acute inflammation. 3. There are no other significant abnormalities. Electronically Signed   By: Donavan Foil M.D.   On: 04/09/2017 23:09   Dg Chest Port 1 View  Result Date: 04/10/2017 CLINICAL DATA:  Follow-up left-sided pneumothorax and left upper lobe atelectasis or mass EXAM: PORTABLE  CHEST 1 VIEW COMPARISON:  CT scan of the chest and PA and lateral chest x-ray of Apr 09, 2017 FINDINGS: There is persistent abnormal density in the left upper lobe with surrounding pneumothorax. There is no significant pleural effusion on the left today. On the right the lung is well-expanded and clear. There is stable pleural thickening in the right apex. The heart and pulmonary vascularity are normal. There is calcification in the wall of the aortic arch. There is surgical suture material in the right suprahilar region for previous right upper lobectomy. The observed bony thorax exhibits no acute abnormality. IMPRESSION: Stable approximately 15 to 20% left pneumothorax. No significant pleural fluid is observed today. Persistent left upper lobe masslike density. Stable chronic bronchitic and postsurgical changes in the right lung. Subtle nodularity at the right lung base persists as well. No CHF. Thoracic aortic atherosclerosis. Electronically Signed   By: David  Martinique M.D.   On: 04/10/2017 08:34    Subjective: Left subclavicular pain occurred last night, resolved with low dose vicodin. None today, ambulating, wants to go home. No dyspnea.   Discharge Exam: BP (!) 129/48   Pulse 71   Temp 98.6 F (37 C) (Oral)   Resp 14   Ht '5\' 4"'$  (1.626 m)   Wt 55.3 kg (122 lb)  SpO2 95%   BMI 20.94 kg/m   General: Pt is alert, awake, not in acute distress Cardiovascular: RRR, S1/S2 +, no rubs, no gallops Respiratory: CTA bilaterally, no wheezing, no rhonchi Abdominal: Soft, NT, ND, bowel sounds + Extremities: No edema, no cyanosis  Labs: Basic Metabolic Panel:  Recent Labs Lab 04/09/17 1305 04/09/17 1326 04/10/17 0247 04/11/17 0348  NA 140 143 137 140  K 4.0 4.2 3.3* 3.4*  CL 106 106 105 107  CO2 28  --  22 23  GLUCOSE 109* 98 94 119*  BUN 15 21* 12 12  CREATININE 1.29* 1.30* 1.10* 1.13*  CALCIUM 9.7  --  9.0 9.4   Liver Function Tests:  Recent Labs Lab 04/09/17 1305 04/10/17 0247   AST 24 20  ALT 12* 11*  ALKPHOS 76 64  BILITOT 0.9 0.8  PROT 6.8 6.1*  ALBUMIN 4.1 3.5   CBC:  Recent Labs Lab 04/09/17 1305 04/09/17 1326 04/10/17 0247 04/11/17 0348  WBC 8.3  --  9.8 12.3*  NEUTROABS 5.5  --   --   --   HGB 13.7 14.3 12.7 11.6*  HCT 43.4 42.0 39.8 37.4  MCV 101.4*  --  100.8* 99.5  PLT 217  --  189 201   Cardiac Enzymes:  Recent Labs Lab 04/09/17 1752 04/09/17 2226  TROPONINI <0.03 <0.03   Urinalysis    Component Value Date/Time   COLORURINE COLORLESS (A) 04/09/2017 1510   APPEARANCEUR CLEAR 04/09/2017 1510   LABSPEC 1.005 04/09/2017 1510   PHURINE 7.0 04/09/2017 1510   GLUCOSEU NEGATIVE 04/09/2017 1510   HGBUR NEGATIVE 04/09/2017 1510   BILIRUBINUR NEGATIVE 04/09/2017 1510   KETONESUR NEGATIVE 04/09/2017 1510   PROTEINUR NEGATIVE 04/09/2017 1510   UROBILINOGEN 0.2 04/07/2015 0339   NITRITE NEGATIVE 04/09/2017 1510   LEUKOCYTESUR NEGATIVE 04/09/2017 1510    Time coordinating discharge: Approximately 40 minutes  Vance Gather, MD  [facility] 04/11/2017, 11:33 AM Pager [phone]

## 2017-04-24 ENCOUNTER — Encounter: Payer: Self-pay | Admitting: *Deleted

## 2017-04-24 NOTE — Progress Notes (Signed)
Oncology Nurse Navigator Documentation  Oncology Nurse Navigator Flowsheets 04/24/2017  Navigator Location [facility]  Navigator Encounter Type Letter/Fax/Email/MTOC letter mailed  Treatment Phase Pre-Tx/Tx Discussion  Barriers/Navigation Needs Education  Education Other  Interventions Education  Education Method Written  Acuity Level 1  Time Spent with Patient 15

## 2017-04-29 ENCOUNTER — Encounter: Payer: Self-pay | Admitting: Adult Health

## 2017-04-29 ENCOUNTER — Ambulatory Visit (INDEPENDENT_AMBULATORY_CARE_PROVIDER_SITE_OTHER)
Admission: RE | Admit: 2017-04-29 | Discharge: 2017-04-29 | Disposition: A | Discharge status: Home / Self Care | Payer: Medicare Other | Source: Ambulatory Visit | Attending: Adult Health | Admitting: Adult Health

## 2017-04-29 ENCOUNTER — Ambulatory Visit (INDEPENDENT_AMBULATORY_CARE_PROVIDER_SITE_OTHER): Payer: Medicare Other | Admitting: Adult Health

## 2017-04-29 DIAGNOSIS — J449 Chronic obstructive pulmonary disease, unspecified: Secondary | ICD-10-CM | POA: Diagnosis not present

## 2017-04-29 DIAGNOSIS — J9383 Other pneumothorax: Secondary | ICD-10-CM | POA: Diagnosis not present

## 2017-04-29 DIAGNOSIS — Z09 Encounter for follow-up examination after completed treatment for conditions other than malignant neoplasm: Secondary | ICD-10-CM | POA: Diagnosis not present

## 2017-04-29 DIAGNOSIS — R55 Syncope and collapse: Secondary | ICD-10-CM | POA: Diagnosis not present

## 2017-04-29 DIAGNOSIS — C3412 Malignant neoplasm of upper lobe, left bronchus or lung: Secondary | ICD-10-CM

## 2017-04-29 NOTE — Progress Notes (Signed)
@Patient  ID: Valerie Young, female    DOB: 12/15/1928, 83 y.o.   MRN: [id number]  Chief Complaint  Patient presents with  . Follow-up    COPD     Referring provider: Leamon Arnt, MD  HPI: 83 year old female former smoker followed for COPD, non-small cell lung carcinoma status post right upper lobe resection 1982 and left upper lobe non-small cell lung cancer status post radiation with curative intent. 2014 Hx of PE    04/29/2017 Follow up : COPD and pneumothorax Patient returns for a follow-up from recent hospitalization. Patient was admitted last month for left-sided pleuritic pain. CT chest showed a small left hydropneumothorax with left upper lobe mass. , significant compromise of the blood supply from occlusion of pulmonary artery and vein.  She was managed conservatively and follow up CXR showed some improvement .  CXR today shows trace residual  left apical PTX. [name] She has been referred to Dr. Julien Nordmann and has an ov this week.  She says she is feeling better but remains weak. . Pleuritic pain has resolved.  She denies any fever, hemoptysis, chest pain, orthopnea, PND, or increased leg swelling   Allergies  Allergen Reactions  . Sulfamethoxazole-Trimethoprim Rash    Immunization History  Administered Date(s) Administered  . Influenza Split 10/20/2012  . Influenza Whole 09/18/2007  . Influenza,inj,Quad PF,36+ Mos 09/28/2013, 09/25/2015  . Influenza-Unspecified 08/25/2014, 10/02/2016  . Pneumococcal Polysaccharide-23 12/25/2007    Past Medical History:  Diagnosis Date  . 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 Cancer ([place])    right lung ca - 30 years ago / left lung - 11/2012  . Cancer ([place])    Left lung  . CKD (chronic kidney disease)   . COPD (chronic obstructive pulmonary disease) ([place])   . DJD (degenerative joint disease)    knees  . H/O: lung cancer 1982   Right; treated with resection  . Hypertension   . Hypothyroidism   . PND (post-nasal  drip)    Chronic    Tobacco History: History  Smoking Status  . Former Smoker  . Packs/day: 1.00  . Years: 35.00  . Types: Cigarettes  . Quit date: 11/25/1980  Smokeless Tobacco  . Never Used   Counseling given: Not Answered   Outpatient Encounter Prescriptions as of 04/29/2017  Medication Sig  . acetaminophen (TYLENOL) 325 MG tablet Take 650 mg by mouth See admin instructions. TWO TO THREE TIMES A DAY  . albuterol (PROVENTIL HFA;VENTOLIN HFA) 108 (90 BASE) MCG/ACT inhaler Inhale 2 puffs into the lungs every 6 (six) hours as needed for wheezing or shortness of breath.  [name] amLODipine (NORVASC) 5 MG tablet TAKE 1 TABLET (5 MG TOTAL) BY MOUTH AT BEDTIME. (Patient taking differently: Take 5 mg by mouth in the morning)  . atenolol (TENORMIN) 50 MG tablet Take 1 tablet (50 mg total) by mouth daily.  . cetirizine (ZYRTEC) 10 MG tablet TAKE 1 TABLET (10 MG TOTAL) BY MOUTH AT BEDTIME. (Patient taking differently: Take 10 mg by mouth at bedtime as needed for allergies)  . Cholecalciferol (VITAMIN D-3) 1000 units CAPS Take 3,000 Units by mouth daily.  [name] levothyroxine (SYNTHROID, LEVOTHROID) 50 MCG tablet Take 50 mcg by mouth daily before breakfast.   . pravastatin (PRAVACHOL) 40 MG tablet Take 40 mg by mouth daily.  [name] guaiFENesin (MUCINEX) 600 MG 12 hr tablet Take 2 tablets (1,200 mg total) by mouth 2 (two) times daily. (Patient not taking: Reported on 04/29/2017)  .  HYDROcodone-acetaminophen (NORCO/VICODIN) 5-325 MG tablet Take 0.5-1 tablets by mouth every 6 (six) hours as needed for moderate pain. (Patient not taking: Reported on 04/29/2017)   No facility-administered encounter medications on file as of 04/29/2017.      Review of Systems  Constitutional:   No  weight loss, night sweats,  Fevers, chills,  +fatigue, or  lassitude.  HEENT:   No headaches,  Difficulty swallowing,  Tooth/dental problems, or  Sore throat,                No sneezing, itching, ear ache, nasal congestion, post nasal  drip,   CV:  No chest pain,  Orthopnea, PND, swelling in lower extremities, anasarca, dizziness, palpitations, syncope.   GI  No heartburn, indigestion, abdominal pain, nausea, vomiting, diarrhea, change in bowel habits, loss of appetite, bloody stools.   Resp:  No chest wall deformity  Skin: no rash or lesions.  GU: no dysuria, change in color of urine, no urgency or frequency.  No flank pain, no hematuria   MS:  No joint pain or swelling.  No decreased range of motion.  No back pain.    Physical Exam  BP 130/70 (BP Location: Right Arm, Patient Position: Sitting, Cuff Size: Normal)   Pulse 63   Ht 5\' 4"  (1.626 m)   Wt 116 lb 6.4 oz (52.8 kg)   SpO2 95%   BMI 19.98 kg/m   GEN: A/Ox3; pleasant , NAD, thin    HEENT:  [place]/AT,  EACs-clear, TMs-wnl, NOSE-clear, THROAT-clear, no lesions, no postnasal drip or exudate noted.   NECK:  Supple w/ fair ROM; no JVD; normal carotid impulses w/o bruits; no thyromegaly or nodules palpated; no lymphadenopathy.    RESP  Decreased BS in bases . no accessory muscle use, no dullness to percussion  CARD:  RRR, no m/r/g, no peripheral edema, pulses intact, no cyanosis or clubbing.  GI:   Soft & nt; nml bowel sounds; no organomegaly or masses detected.   Musco: Warm bil, no deformities or joint swelling noted.   Neuro: alert, no focal deficits noted.    Skin: Warm, no lesions or rashes   Lab Results:   BNP No results found for: BNP  ProBNP No results found for: PROBNP  Imaging: Dg Chest 2 View  Result Date: 04/29/2017 CLINICAL DATA:  History of left pneumothorax. EXAM: CHEST  2 VIEW COMPARISON:  Chest x-ray 04/11/2017, 04/10/2017, 04/09/2017 CT chest 04/09/2017 FINDINGS: Trace residual left apical pneumothorax. Chronic left upper lobe partial collapse. Hyperinflated lungs consistent with COPD. No other focal consolidation. No pleural effusion. No right pneumothorax. Stable cardiomediastinal silhouette. No acute osseous abnormality.  IMPRESSION: 1. Trace residual left apical pneumothorax. Chronic left upper lobe partial collapse. Electronically Signed   By: Kathreen Devoid   On: 04/29/2017 14:48   Dg Chest 2 View  Result Date: 04/11/2017 CLINICAL DATA:  Pneumothorax EXAM: CHEST  2 VIEW COMPARISON:  Apr 10, 2017 chest radiograph and chest CT Apr 09, 2017 FINDINGS: There is a persistent left apical and apicolateral pneumothorax, unchanged from recent studies. There is consolidation involving much of the left upper lobe posteriorly, stable. There is a small pleural effusion on the left. There are postoperative changes on the right. There is scarring in the right apex. There is also scarring in the right base. There is no edema or consolidation on the right. The heart size is normal. Pulmonary vascularity is stable and reflects the underlying postoperative change as well as the consolidation in the left  upper lobe and pneumothorax. There is aortic atherosclerosis. No adenopathy evident. No bone lesions. IMPRESSION: Persistent pneumothorax on the left without appreciable change. There is consolidation and volume loss of much of the left upper lobe. There is a new small left pleural effusion. Areas of scarring are stable on the right. No new consolidation or volume loss. Stable cardiac silhouette. There is aortic atherosclerosis. Electronically Signed   By: Lowella Grip III M.D.   On: 04/11/2017 07:59   Dg Chest 2 View  Result Date: 04/09/2017 CLINICAL DATA:  Chest pain. EXAM: CHEST  2 VIEW COMPARISON:  No recent prior. FINDINGS: Surgical clips right chest. Low lung volumes with basilar atelectasis. Mild to moderate left apical pneumothorax noted. Associated left density noted. This could be from atelectasis. Left apical mass cannot be excluded. Small bilateral pleural effusions. Mild infiltrate in the right lung base cannot be excluded. Mild nodular density right lung base cannot be excluded. Follow-up chest x-rays recommended demonstrate  resolution of these findings. Degenerative changes thoracic spine. IMPRESSION: 1. Mild to moderate left apical pneumothorax noted. Associated apical density is noted. This could be from atelectasis. Left apical mass cannot be excluded. 2. Postsurgical changes right lung. Infiltrate right lung base. Mild nodular density right lung base cannot be excluded. 3. Small bilateral pleural effusions. Follow-up chest x-rays recommended to demonstrate clearing of these findings. Critical Value/emergent results were called by telephone at the time of interpretation on 04/09/2017 at 1:02 pm to Dr. Shirlyn Goltz , who verbally acknowledged these results. Electronically Signed   By: Marcello Moores  Register   On: 04/09/2017 13:05   Ct Chest W Contrast  Result Date: 04/09/2017 CLINICAL DATA:  83 year old female with history of left-sided pneumothorax. Possible left apical mass. EXAM: CT CHEST WITH CONTRAST TECHNIQUE: Multidetector CT imaging of the chest was performed during intravenous contrast administration. CONTRAST:  63mL ISOVUE-300 IOPAMIDOL (ISOVUE-300) INJECTION 61% COMPARISON:  No priors. FINDINGS: Cardiovascular: Heart size is normal. There is no significant pericardial fluid, thickening or pericardial calcification. There is aortic atherosclerosis, as well as atherosclerosis of the great vessels of the mediastinum and the coronary arteries, including calcified atherosclerotic plaque in the left anterior descending, left circumflex and right coronary arteries. Both the left upper lobe pulmonary artery and the left superior pulmonary vein are completely occluded (image 56 of series 3 and image 65 of series 3 respectively). Mediastinum/Nodes: No pathologically enlarged mediastinal or hilar lymph nodes. Esophagus is unremarkable in appearance. No axillary lymphadenopathy. Lungs/Pleura: Small left-sided pneumothorax. Trace volume of dependent left pleural effusion. Near complete atelectasis of the left upper lobe which is remarkable  for lack of significant internal enhancement, and a somewhat bulbous appearance which measures up to 5.5 x 3.7 cm (axial image 39 of series 3), which would be unusual for a completely atelectatic portion of the lung. Fibrotic changes are noted in the medial aspect of the left lower lobe in the perihilar region, presumably from prior radiation therapy. Status post right upper lobectomy. Multifocal nodularity noted, most evident in the anterior aspect of the right lower lobe where there is extensive peribronchovascular micro and macronodularity, which is most compatible with areas of mucoid impaction within terminal bronchioles. Ovoid shaped 17 x 8 mm ground-glass attenuation nodule in the medial aspect of the right lower lobe (image 92 of series 5). Diffuse bronchial wall thickening with mild centrilobular and paraseptal emphysema. Upper Abdomen: Aortic atherosclerosis. Musculoskeletal: There are no aggressive appearing lytic or blastic lesions noted in the visualized portions of the skeleton. IMPRESSION: 1.  Small left-sided hydropneumothorax, presumably spontaneous related to rupture of a bleb in this patient with underlying emphysema. 2. Near complete atelectasis of the left upper lobe. The left upper lobe is unusual in appearance, more rounded and mass-like than typically seen for simple atelectatic lung. This raises concern for potential neoplasm. Notably, the blood supply to the left upper lobe (with exception of small bronchial artery branches) appears completely compromised, as the left upper lobe pulmonary artery and left superior pulmonary vein are both occluded. 3. Extensive peribronchovascular micro and macronodularity in the lungs, most evident in the anterior aspect of the right lower lobe, likely to reflect areas of mucoid impaction within terminal bronchioles. 4. Mild diffuse bronchial wall thickening with mild centrilobular emphysema. 5. Status post right upper lobectomy. Postradiation changes in the  medial aspect of the left lower lobe. 6. Aortic atherosclerosis, in addition to three-vessel coronary artery disease. Electronically Signed   By: Vinnie Langton M.D.   On: 04/09/2017 16:07   Ct Abdomen Pelvis W Contrast  Result Date: 04/09/2017 CLINICAL DATA:  Possible lung neoplasm EXAM: CT ABDOMEN AND PELVIS WITH CONTRAST TECHNIQUE: Multidetector CT imaging of the abdomen and pelvis was performed using the standard protocol following bolus administration of intravenous contrast. CONTRAST:  75 mL Isovue 300 intravenous COMPARISON:  CT 04/09/2017, 04/07/2015 FINDINGS: Lower chest: Moderate hydropneumothorax at the left base, grossly stable. Nodularity within the right middle lobe as before. Coronary artery calcification. Heart size within normal limits. Hepatobiliary: No focal hepatic abnormality. No calcified gallstones or biliary dilatation. Pancreas: Slightly prominent pancreatic duct. No inflammatory changes. Spleen: Normal in size without focal abnormality. Adrenals/Urinary Tract: Adrenal glands are within normal limits. Subcentimeter hypodense kidney lesions too small to further characterize. Cortical scarring within both kidneys. Bladder unremarkable. Stomach/Bowel: Stomach is nonenlarged. No dilated small bowel. No colon wall thickening. Extensive diverticular disease of the sigmoid colon without acute inflammation. Vascular/Lymphatic: Aortic atherosclerosis. No enlarged abdominal or pelvic lymph nodes. Reproductive: Status post hysterectomy. No adnexal masses. Other: No free air or free fluid. Musculoskeletal: No acute or suspicious bone lesions. Degenerative changes. IMPRESSION: 1. Grossly stable moderate size left hydropneumothorax at the left base. Stable nodularity in the right middle lobe. 2. Sigmoid colon diverticular disease without acute inflammation. 3. There are no other significant abnormalities. Electronically Signed   By: Donavan Foil M.D.   On: 04/09/2017 23:09   Dg Chest Port 1  View  Result Date: 04/10/2017 CLINICAL DATA:  Follow-up left-sided pneumothorax and left upper lobe atelectasis or mass EXAM: PORTABLE CHEST 1 VIEW COMPARISON:  CT scan of the chest and PA and lateral chest x-ray of Apr 09, 2017 FINDINGS: There is persistent abnormal density in the left upper lobe with surrounding pneumothorax. There is no significant pleural effusion on the left today. On the right the lung is well-expanded and clear. There is stable pleural thickening in the right apex. The heart and pulmonary vascularity are normal. There is calcification in the wall of the aortic arch. There is surgical suture material in the right suprahilar region for previous right upper lobectomy. The observed bony thorax exhibits no acute abnormality. IMPRESSION: Stable approximately 15 to 20% left pneumothorax. No significant pleural fluid is observed today. Persistent left upper lobe masslike density. Stable chronic bronchitic and postsurgical changes in the right lung. Subtle nodularity at the right lung base persists as well. No CHF. Thoracic aortic atherosclerosis. Electronically Signed   By: David  Martinique M.D.   On: 04/10/2017 08:34     Assessment & Plan:  Pneumothorax Left sided PTX is improved on chest xray today .  Continue to monitor   Lung cancer [facility]) Left sided NSCLC 2014 s/p XRT with radiation changes . CT scans reviewed with Dr. Lamonte Sakai  . Pt w/ recent scan showing mass like consolidation along with PTX makes it hard to compare to previous CT chest in 11/2016 for comparison .  She has been referred to Oncology . May need to consdier PET vs interval CT chest  In 3 months for change .  Will leave to Oncology .      Rexene Edison, NP 04/29/2017

## 2017-04-29 NOTE — Assessment & Plan Note (Signed)
Left sided PTX is improved on chest xray today .  Continue to monitor

## 2017-04-29 NOTE — Patient Instructions (Addendum)
Continue on current regimen  Follow up with Dr. Julien Nordmann this week as planned.  Follow up with Dr. Lamonte Sakai  In 3-4 months and As needed

## 2017-04-29 NOTE — Assessment & Plan Note (Signed)
Left sided NSCLC 2014 s/p XRT with radiation changes . CT scans reviewed with Dr. Lamonte Sakai  . Pt w/ recent scan showing mass like consolidation along with PTX makes it hard to compare to previous CT chest in 11/2016 for comparison .  She has been referred to Oncology . May need to consdier PET vs interval CT chest  In 3 months for change .  Will leave to Oncology .

## 2017-05-01 ENCOUNTER — Ambulatory Visit (HOSPITAL_BASED_OUTPATIENT_CLINIC_OR_DEPARTMENT_OTHER): Payer: Medicare Other | Admitting: Internal Medicine

## 2017-05-01 ENCOUNTER — Ambulatory Visit: Payer: Medicare Other | Admitting: Radiation Oncology

## 2017-05-01 ENCOUNTER — Telehealth: Payer: Self-pay | Admitting: Internal Medicine

## 2017-05-01 ENCOUNTER — Other Ambulatory Visit: Payer: Medicare Other

## 2017-05-01 ENCOUNTER — Encounter: Payer: Self-pay | Admitting: Internal Medicine

## 2017-05-01 ENCOUNTER — Encounter: Payer: Self-pay | Admitting: *Deleted

## 2017-05-01 ENCOUNTER — Ambulatory Visit: Payer: Medicare Other | Attending: Internal Medicine | Admitting: Physical Therapy

## 2017-05-01 VITALS — BP 120/74 | HR 64 | Temp 98.3°F | Resp 19 | Ht 64.0 in | Wt 118.1 lb

## 2017-05-01 DIAGNOSIS — Z85118 Personal history of other malignant neoplasm of bronchus and lung: Secondary | ICD-10-CM | POA: Diagnosis not present

## 2017-05-01 DIAGNOSIS — M25662 Stiffness of left knee, not elsewhere classified: Secondary | ICD-10-CM

## 2017-05-01 DIAGNOSIS — R262 Difficulty in walking, not elsewhere classified: Secondary | ICD-10-CM | POA: Diagnosis not present

## 2017-05-01 DIAGNOSIS — R918 Other nonspecific abnormal finding of lung field: Secondary | ICD-10-CM

## 2017-05-01 DIAGNOSIS — N189 Chronic kidney disease, unspecified: Secondary | ICD-10-CM | POA: Diagnosis not present

## 2017-05-01 DIAGNOSIS — C3492 Malignant neoplasm of unspecified part of left bronchus or lung: Secondary | ICD-10-CM

## 2017-05-01 NOTE — Telephone Encounter (Signed)
Scheduled appt per 6/7 los - Gave patient AVS and calender . [facility] to contact patient with PET schedule.

## 2017-05-01 NOTE — Progress Notes (Signed)
[place] [phone]   [phone] Multidisciplinary thoracic oncology clinic  CONSULT NOTE  REFERRING PHYSICIAN: Dr. Vance Gather  REASON FOR CONSULTATION:  83 years old white female with history of lung cancer and questionable disease recurrence.Valerie Young  HPI Valerie Young is a 83 y.o. female was past medical history significant for chronic kidney disease, COPD, hypertension, hypothyroidism, degenerative joint disease as well as history of recurrent lung cancer. The patient mentioned that she was diagnosed with early stage lung cancer in 1981 status post right upper lobectomy. In January 2014 she had a left upper lobe pulmonary nodule and bronchoscopy was consistent with stage IB (T2, N0, M0) non-small cell lung cancer, adenocarcinoma. She was not a candidate for surgical resection and the patient underwent curative radiotherapy under the care of Dr. Tammi Klippel completed on 03/04/2013. She has been followed by the imaging studies at regular basis and there was no clear evidence for disease recurrence. She was admitted recently to [facility] with shortness of breath and repeat CT scan of the chest on 04/09/2017 showed near complete atelectasis of the left lower lobe that was unusual in appearance and more rounded raising concern for potential neoplasm. There was also extensive peribronchial vascular microadenomata corona DT in the lungs most evident in the anterior aspect of the right lower lobe. The patient was referred to the multidisciplinary thoracic oncology clinic today for evaluation and recommendation regarding the findings on her CT scan. When seen today she is feeling fine with no specific complaints except for shortness breath with exertion and mild cough. She denied having any chest pain or hemoptysis. She denied having any weight loss or night sweats. She has no nausea, vomiting, diarrhea or constipation. She has no fever or chills. She has no  headache or visual changes. Family history significant for mother with a stroke and father died in an accident. The patient is a widow and has 4 children. She was accompanied today by her son, Valerie Young. She was a homemaker. She has a history of smoking for around 20 years but quit in 1981. She has no history of alcohol or drug abuse.   HPI  Past Medical History:  Diagnosis Date  . 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 Cancer ([place])    right lung ca - 30 years ago / left lung - 11/2012  . Cancer ([place])    Left lung  . CKD (chronic kidney disease)   . COPD (chronic obstructive pulmonary disease) ([place])   . DJD (degenerative joint disease)    knees  . H/O: lung cancer 1982   Right; treated with resection  . Hypertension   . Hypothyroidism   . PND (post-nasal drip)    Chronic    Past Surgical History:  Procedure Laterality Date  . ABDOMINAL HYSTERECTOMY     PARTIAL  . CATARACT EXTRACTION, BILATERAL    . JOINT REPLACEMENT     RIGHT TOTAL KNEE  . RADIATION OF LEFT LUNG    . right lung cancer treated with resection  1982  . right thigh postoperative hematoma  02-2008  . RIGHT UPPER LOBE REMOVED    . THYROID SURGERY    . THYROIDECTOMY    . TOTAL KNEE ARTHROPLASTY     Right  . VESICOVAGINAL FISTULA CLOSURE W/ TAH    . VIDEO BRONCHOSCOPY  12/08/2012   Procedure: VIDEO BRONCHOSCOPY WITH FLUORO;  Surgeon: Kathee Delton, MD;  Location: [facility];  Service: Cardiopulmonary;  Laterality: Bilateral;    Family History  Problem Relation Age of Onset  . CVA Mother 61       hemorrhagic     Social History Social History  Substance Use Topics  . Smoking status: Former Smoker    Packs/day: 1.00    Years: 35.00    Types: Cigarettes    Quit date: 11/25/1980  . Smokeless tobacco: Never Used  . Alcohol use No    Allergies  Allergen Reactions  . Sulfamethoxazole-Trimethoprim Rash    Current Outpatient Prescriptions  Medication Sig Dispense Refill  . acetaminophen  (TYLENOL) 325 MG tablet Take 650 mg by mouth See admin instructions. TWO TO THREE TIMES A DAY    . albuterol (PROVENTIL HFA;VENTOLIN HFA) 108 (90 BASE) MCG/ACT inhaler Inhale 2 puffs into the lungs every 6 (six) hours as needed for wheezing or shortness of breath. 1 Inhaler 5  . amLODipine (NORVASC) 5 MG tablet TAKE 1 TABLET (5 MG TOTAL) BY MOUTH AT BEDTIME. (Patient taking differently: Take 5 mg by mouth in the morning) 90 tablet 0  . atenolol (TENORMIN) 50 MG tablet Take 1 tablet (50 mg total) by mouth daily. 90 tablet 3  . cetirizine (ZYRTEC) 10 MG tablet TAKE 1 TABLET (10 MG TOTAL) BY MOUTH AT BEDTIME. (Patient taking differently: Take 10 mg by mouth at bedtime as needed for allergies) 30 tablet 2  . Cholecalciferol (VITAMIN D-3) 1000 units CAPS Take 3,000 Units by mouth daily.    Valerie Young guaiFENesin (MUCINEX) 600 MG 12 hr tablet Take 2 tablets (1,200 mg total) by mouth 2 (two) times daily. (Patient not taking: Reported on 04/29/2017) 20 tablet 0  . HYDROcodone-acetaminophen (NORCO/VICODIN) 5-325 MG tablet Take 0.5-1 tablets by mouth every 6 (six) hours as needed for moderate pain. (Patient not taking: Reported on 04/29/2017) 10 tablet 0  . levothyroxine (SYNTHROID, LEVOTHROID) 50 MCG tablet Take 50 mcg by mouth daily before breakfast.   8  . pravastatin (PRAVACHOL) 40 MG tablet Take 40 mg by mouth daily.  3   No current facility-administered medications for this visit.     Review of Systems  Constitutional: positive for fatigue Eyes: negative Ears, nose, mouth, throat, and face: negative Respiratory: positive for cough and dyspnea on exertion Cardiovascular: negative Gastrointestinal: negative Genitourinary:negative Integument/breast: negative Hematologic/lymphatic: negative Musculoskeletal:negative Neurological: negative Behavioral/Psych: negative Endocrine: negative Allergic/Immunologic: negative  Physical Exam  MEQ:ASTMH, healthy, no distress, comfortable and cooperative SKIN: skin  color, texture, turgor are normal, no rashes or significant lesions HEAD: Normocephalic, No masses, lesions, tenderness or abnormalities EYES: normal, PERRLA, Conjunctiva are pink and non-injected EARS: External ears normal, Canals clear OROPHARYNX:no exudate, no erythema and lips, buccal mucosa, and tongue normal  NECK: supple, no adenopathy, no JVD LYMPH:  no palpable lymphadenopathy, no hepatosplenomegaly BREAST:not examined LUNGS: clear to auscultation , and palpation HEART: regular rate & rhythm, no murmurs and no gallops ABDOMEN:abdomen soft, non-tender, normal bowel sounds and no masses or organomegaly BACK: Back symmetric, no curvature., No CVA tenderness EXTREMITIES:no joint deformities, effusion, or inflammation, no edema  NEURO: alert & oriented x 3 with fluent speech, no focal motor/sensory deficits  PERFORMANCE STATUS: ECOG 1  LABORATORY DATA: Lab Results  Component Value Date   WBC 12.3 (H) 04/11/2017   HGB 11.6 (L) 04/11/2017   HCT 37.4 04/11/2017   MCV 99.5 04/11/2017   PLT 201 04/11/2017      Chemistry      Component Value Date/Time   NA 140 04/11/2017 0348   K 3.4 (L)  04/11/2017 0348   CL 107 04/11/2017 0348   CO2 23 04/11/2017 0348   BUN 12 04/11/2017 0348   CREATININE 1.13 (H) 04/11/2017 0348   CREATININE 1.55 (H) 11/15/2013 1832      Component Value Date/Time   CALCIUM 9.4 04/11/2017 0348   ALKPHOS 64 04/10/2017 0247   AST 20 04/10/2017 0247   ALT 11 (L) 04/10/2017 0247   BILITOT 0.8 04/10/2017 0247       RADIOGRAPHIC STUDIES: Dg Chest 2 View  Result Date: 04/29/2017 CLINICAL DATA:  History of left pneumothorax. EXAM: CHEST  2 VIEW COMPARISON:  Chest x-ray 04/11/2017, 04/10/2017, 04/09/2017 CT chest 04/09/2017 FINDINGS: Trace residual left apical pneumothorax. Chronic left upper lobe partial collapse. Hyperinflated lungs consistent with COPD. No other focal consolidation. No pleural effusion. No right pneumothorax. Stable cardiomediastinal  silhouette. No acute osseous abnormality. IMPRESSION: 1. Trace residual left apical pneumothorax. Chronic left upper lobe partial collapse. Electronically Signed   By: Kathreen Devoid   On: 04/29/2017 14:48   Dg Chest 2 View  Result Date: 04/11/2017 CLINICAL DATA:  Pneumothorax EXAM: CHEST  2 VIEW COMPARISON:  Apr 10, 2017 chest radiograph and chest CT Apr 09, 2017 FINDINGS: There is a persistent left apical and apicolateral pneumothorax, unchanged from recent studies. There is consolidation involving much of the left upper lobe posteriorly, stable. There is a small pleural effusion on the left. There are postoperative changes on the right. There is scarring in the right apex. There is also scarring in the right base. There is no edema or consolidation on the right. The heart size is normal. Pulmonary vascularity is stable and reflects the underlying postoperative change as well as the consolidation in the left upper lobe and pneumothorax. There is aortic atherosclerosis. No adenopathy evident. No bone lesions. IMPRESSION: Persistent pneumothorax on the left without appreciable change. There is consolidation and volume loss of much of the left upper lobe. There is a new small left pleural effusion. Areas of scarring are stable on the right. No new consolidation or volume loss. Stable cardiac silhouette. There is aortic atherosclerosis. Electronically Signed   By: Lowella Grip III M.D.   On: 04/11/2017 07:59   Dg Chest 2 View  Result Date: 04/09/2017 CLINICAL DATA:  Chest pain. EXAM: CHEST  2 VIEW COMPARISON:  No recent prior. FINDINGS: Surgical clips right chest. Low lung volumes with basilar atelectasis. Mild to moderate left apical pneumothorax noted. Associated left density noted. This could be from atelectasis. Left apical mass cannot be excluded. Small bilateral pleural effusions. Mild infiltrate in the right lung base cannot be excluded. Mild nodular density right lung base cannot be excluded.  Follow-up chest x-rays recommended demonstrate resolution of these findings. Degenerative changes thoracic spine. IMPRESSION: 1. Mild to moderate left apical pneumothorax noted. Associated apical density is noted. This could be from atelectasis. Left apical mass cannot be excluded. 2. Postsurgical changes right lung. Infiltrate right lung base. Mild nodular density right lung base cannot be excluded. 3. Small bilateral pleural effusions. Follow-up chest x-rays recommended to demonstrate clearing of these findings. Critical Value/emergent results were called by telephone at the time of interpretation on 04/09/2017 at 1:02 pm to Dr. Shirlyn Goltz , who verbally acknowledged these results. Electronically Signed   By: Marcello Moores  Register   On: 04/09/2017 13:05   Ct Chest W Contrast  Result Date: 04/09/2017 CLINICAL DATA:  83 year old female with history of left-sided pneumothorax. Possible left apical mass. EXAM: CT CHEST WITH CONTRAST TECHNIQUE: Multidetector CT imaging of  the chest was performed during intravenous contrast administration. CONTRAST:  58mL ISOVUE-300 IOPAMIDOL (ISOVUE-300) INJECTION 61% COMPARISON:  No priors. FINDINGS: Cardiovascular: Heart size is normal. There is no significant pericardial fluid, thickening or pericardial calcification. There is aortic atherosclerosis, as well as atherosclerosis of the great vessels of the mediastinum and the coronary arteries, including calcified atherosclerotic plaque in the left anterior descending, left circumflex and right coronary arteries. Both the left upper lobe pulmonary artery and the left superior pulmonary vein are completely occluded (image 56 of series 3 and image 65 of series 3 respectively). Mediastinum/Nodes: No pathologically enlarged mediastinal or hilar lymph nodes. Esophagus is unremarkable in appearance. No axillary lymphadenopathy. Lungs/Pleura: Small left-sided pneumothorax. Trace volume of dependent left pleural effusion. Near complete  atelectasis of the left upper lobe which is remarkable for lack of significant internal enhancement, and a somewhat bulbous appearance which measures up to 5.5 x 3.7 cm (axial image 39 of series 3), which would be unusual for a completely atelectatic portion of the lung. Fibrotic changes are noted in the medial aspect of the left lower lobe in the perihilar region, presumably from prior radiation therapy. Status post right upper lobectomy. Multifocal nodularity noted, most evident in the anterior aspect of the right lower lobe where there is extensive peribronchovascular micro and macronodularity, which is most compatible with areas of mucoid impaction within terminal bronchioles. Ovoid shaped 17 x 8 mm ground-glass attenuation nodule in the medial aspect of the right lower lobe (image 92 of series 5). Diffuse bronchial wall thickening with mild centrilobular and paraseptal emphysema. Upper Abdomen: Aortic atherosclerosis. Musculoskeletal: There are no aggressive appearing lytic or blastic lesions noted in the visualized portions of the skeleton. IMPRESSION: 1. Small left-sided hydropneumothorax, presumably spontaneous related to rupture of a bleb in this patient with underlying emphysema. 2. Near complete atelectasis of the left upper lobe. The left upper lobe is unusual in appearance, more rounded and mass-like than typically seen for simple atelectatic lung. This raises concern for potential neoplasm. Notably, the blood supply to the left upper lobe (with exception of small bronchial artery branches) appears completely compromised, as the left upper lobe pulmonary artery and left superior pulmonary vein are both occluded. 3. Extensive peribronchovascular micro and macronodularity in the lungs, most evident in the anterior aspect of the right lower lobe, likely to reflect areas of mucoid impaction within terminal bronchioles. 4. Mild diffuse bronchial wall thickening with mild centrilobular emphysema. 5. Status  post right upper lobectomy. Postradiation changes in the medial aspect of the left lower lobe. 6. Aortic atherosclerosis, in addition to three-vessel coronary artery disease. Electronically Signed   By: Vinnie Langton M.D.   On: 04/09/2017 16:07   Ct Abdomen Pelvis W Contrast  Result Date: 04/09/2017 CLINICAL DATA:  Possible lung neoplasm EXAM: CT ABDOMEN AND PELVIS WITH CONTRAST TECHNIQUE: Multidetector CT imaging of the abdomen and pelvis was performed using the standard protocol following bolus administration of intravenous contrast. CONTRAST:  75 mL Isovue 300 intravenous COMPARISON:  CT 04/09/2017, 04/07/2015 FINDINGS: Lower chest: Moderate hydropneumothorax at the left base, grossly stable. Nodularity within the right middle lobe as before. Coronary artery calcification. Heart size within normal limits. Hepatobiliary: No focal hepatic abnormality. No calcified gallstones or biliary dilatation. Pancreas: Slightly prominent pancreatic duct. No inflammatory changes. Spleen: Normal in size without focal abnormality. Adrenals/Urinary Tract: Adrenal glands are within normal limits. Subcentimeter hypodense kidney lesions too small to further characterize. Cortical scarring within both kidneys. Bladder unremarkable. Stomach/Bowel: Stomach is nonenlarged. No dilated  small bowel. No colon wall thickening. Extensive diverticular disease of the sigmoid colon without acute inflammation. Vascular/Lymphatic: Aortic atherosclerosis. No enlarged abdominal or pelvic lymph nodes. Reproductive: Status post hysterectomy. No adnexal masses. Other: No free air or free fluid. Musculoskeletal: No acute or suspicious bone lesions. Degenerative changes. IMPRESSION: 1. Grossly stable moderate size left hydropneumothorax at the left base. Stable nodularity in the right middle lobe. 2. Sigmoid colon diverticular disease without acute inflammation. 3. There are no other significant abnormalities. Electronically Signed   By: Donavan Foil M.D.   On: 04/09/2017 23:09   Dg Chest Port 1 View  Result Date: 04/10/2017 CLINICAL DATA:  Follow-up left-sided pneumothorax and left upper lobe atelectasis or mass EXAM: PORTABLE CHEST 1 VIEW COMPARISON:  CT scan of the chest and PA and lateral chest x-ray of Apr 09, 2017 FINDINGS: There is persistent abnormal density in the left upper lobe with surrounding pneumothorax. There is no significant pleural effusion on the left today. On the right the lung is well-expanded and clear. There is stable pleural thickening in the right apex. The heart and pulmonary vascularity are normal. There is calcification in the wall of the aortic arch. There is surgical suture material in the right suprahilar region for previous right upper lobectomy. The observed bony thorax exhibits no acute abnormality. IMPRESSION: Stable approximately 15 to 20% left pneumothorax. No significant pleural fluid is observed today. Persistent left upper lobe masslike density. Stable chronic bronchitic and postsurgical changes in the right lung. Subtle nodularity at the right lung base persists as well. No CHF. Thoracic aortic atherosclerosis. Electronically Signed   By: David  Martinique M.D.   On: 04/10/2017 08:34    ASSESSMENT: This is a very pleasant 83 years old white female with history of non-small cell lung cancer status post right upper lobectomy in 1981 as well as stage IB non-small cell lung cancer, adenocarcinoma of the left upper lobe status post curative radiotherapy. The patient has been observation and recent CT scan of the chest showed some concerning findings in the left upper lobe with near complete atelectasis. There was also a suspicious lesion in the right lower lobe.  PLAN: I had a lengthy discussion with the patient and her son today about her current disease stage, prognosis and treatment options. I personally and independently reviewed the scan images and discuss the results and showed the images to the patient  and her son today. The collapse of the left upper lobe is concerning for underlying disease recurrence. I recommended for the patient to have repeat PET scan in the next 2 weeks for reevaluation of her disease. I will see her back for follow-up visit after the PET scan for more detailed discussion of her treatment options and consideration of biopsy if needed. The patient and her son agreed to the current plan. She was advised to call immediately if she has any concerning symptoms in the interval. The patient voices understanding of current disease status and treatment options and is in agreement with the current care plan. All questions were answered. The patient knows to call the clinic with any problems, questions or concerns. We can certainly see the patient much sooner if necessary. Thank you so much for allowing me to participate in the care of Desire A Karras. I will continue to follow up the patient with you and assist in her care.  I spent 40 minutes counseling the patient face to face. The total time spent in the appointment was 60 minutes.  Disclaimer: This note was dictated with voice recognition software. Similar sounding words can inadvertently be transcribed and may not be corrected upon review.   Juanice Warburton K. May 01, 2017, 3:19 PM

## 2017-05-01 NOTE — Therapy (Signed)
[place], [place], [address] Phone: [phone]   Fax:  [phone]  Physical Therapy Evaluation  Patient Details  Name: Valerie Young MRN: [id number] Date of Birth: 12-Mar-1929 Referring Provider: Dr. Curt Bears  Encounter Date: 05/01/2017      PT End of Session - 05/01/17 1716    Visit Number 1   Number of Visits 1   PT Start Time 7948   PT Stop Time 1537   PT Time Calculation (min) 22 min   Activity Tolerance Patient tolerated treatment well   Behavior During Therapy [name] for tasks assessed/performed      Past Medical History:  Diagnosis Date  . 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 Cancer ([place])    right lung ca - 30 years ago / left lung - 11/2012  . Cancer ([place])    Left lung  . CKD (chronic kidney disease)   . COPD (chronic obstructive pulmonary disease) ([place])   . DJD (degenerative joint disease)    knees  . H/O: lung cancer 1982   Right; treated with resection  . Hypertension   . Hypothyroidism   . PND (post-nasal drip)    Chronic    Past Surgical History:  Procedure Laterality Date  . ABDOMINAL HYSTERECTOMY     PARTIAL  . CATARACT EXTRACTION, BILATERAL    . JOINT REPLACEMENT     RIGHT TOTAL KNEE  . RADIATION OF LEFT LUNG    . right lung cancer treated with resection  1982  . right thigh postoperative hematoma  02-2008  . RIGHT UPPER LOBE REMOVED    . THYROID SURGERY    . THYROIDECTOMY    . TOTAL KNEE ARTHROPLASTY     Right  . VESICOVAGINAL FISTULA CLOSURE W/ TAH    . VIDEO BRONCHOSCOPY  12/08/2012   Procedure: VIDEO BRONCHOSCOPY WITH FLUORO;  Surgeon: Kathee Delton, MD;  [facility]: [facility];  Service: Cardiopulmonary;  Laterality: Bilateral;    There were no vitals filed for this visit.       Subjective Assessment - 05/01/17 1701    Subjective Has bad knees that limit her mobility.   Patient is accompained by: Family member  son, Valerie Young People    Pertinent History Pt. presented with left pleuritic pain and workup showed a left hydropneumothorax and left lung mass.  Ex-smoker who quit in 1982 after 35 pack-years; h/o non-small cell lung cancer x/p resection 1982 and another NSCLC adenocarcinoma in left upper lobe s/p XRT 2014.  h/o right TKA; COPD, CKD, DJD, HTN.   Patient Stated Goals get info from all lung clinic providers   Currently in Pain? No/denies            [facility] PT Assessment - 05/01/17 0001      Assessment   Medical Diagnosis left lung mass   Referring Provider Dr. Curt Bears   Onset Date/Surgical Date 12/02/16   Prior Therapy none     Precautions   Precautions Fall;Other (comment)   Precaution Comments cancer precautions     Restrictions   Weight Bearing Restrictions No     Balance Screen   Has the patient fallen in the past 6 months Yes  fell carrying her dog when got off balance   How many times? 1   Has the patient had a decrease in activity level because of a fear of falling?  No  she denies dizziness or other cause of balance problems  Is the patient reluctant to leave their home because of a fear of falling?  No     Home Environment   Living Environment Private residence   Living Arrangements Alone   Type of [place]   Additional Comments no stairs at her apartment, which was designed for senior living; has grab bars     Prior Function   Level of Independence Independent     Cognition   Overall Cognitive Status Within Functional Limits for tasks assessed     Observation/Other Assessments   Observations fairly thin woman who looks younger than her age, sitting in a wheelchair that was provided by the [facility] today.     Functional Tests   Functional tests Sit to Stand     Sit to Stand   Comments unable to come to standing without pushing off with her hands     Posture/Postural Control   Posture/Postural Control Postural limitations   Postural Limitations Forward head      ROM / Strength   AROM / PROM / Strength AROM     AROM   Overall AROM Comments trunk A/ROM limited 50% in extension in standing and 20% limited in right and left sidebend; others [facility]     Ambulation/Gait   Ambulation/Gait Yes   Ambulation/Gait Assistance 6: Modified independent (Device/Increase time)   Ambulation Distance (Feet) 20 Feet   Assistive device Straight cane  has walker at home   Stairs No  can't negotiate due to left leg pain and stiffness   Gait Comments left knee is held stiff, leans to right as she walks     Balance   Balance Assessed Yes     Dynamic Standing Balance   Dynamic Standing - Comments reaches forward 6 inches in standing, below average for age            Objective measurements completed on examination: See above findings.                  PT Education - 05/01/17 1715    Education provided Yes   Education Details energy conservation, doing water exercise, CURE article on staying active, posture, breathing, PT info   Person(s) Educated Patient;Child(ren)   Methods Explanation;Handout   Comprehension Verbalized understanding               Lung Clinic Goals - 05/01/17 1726      Patient will be able to verbalize understanding of the benefit of exercise to decrease fatigue.   Status Achieved     Patient will be able to verbalize the importance of posture.   Status Achieved     Patient will be able to demonstrate diaphragmatic breathing for improved lung function.   Status Achieved     Patient will be able to verbalize understanding of the role of physical therapy to prevent functional decline and who to contact if physical therapy is needed.   Status Achieved              Plan - 05/01/17 1717    Clinical Impression Statement This is a pleasant 83 year-old woman who looks younger than her age, accompanied by her son today.  She has moderately limited mobilty due to knee problems, including a right EKA.  She say  she can't do stairs due to her left leg (knee0   History and Personal Factors relevant to plan of care: h/o non-small cell lung cancer x 2, COPD, CKD, DJD.   Clinical Presentation Evolving  Clinical Presentation due to: just getting her cancer diagnosis and treatment  plan   Rehab Potential Fair   PT Frequency One time visit   PT Treatment/Interventions Patient/family education   PT Next Visit Plan None at this time; she may benefit from some therapy to improve left knee discomfort and improve function of that.   PT Home Exercise Plan some type of water exercise if possible, breathing exercises   Consulted and Agree with Plan of Care Patient;Family member/caregiver      Patient will benefit from skilled therapeutic intervention in order to improve the following deficits and impairments:  Decreased mobility, Decreased range of motion, Abnormal gait, Pain  Visit Diagnosis: Stiffness of left knee, not elsewhere classified  Difficulty in walking, not elsewhere classified      G-Codes - May 30, 2017 1728    Functional Assessment Tool Used (Outpatient Only) clinical judgement   Functional Limitation Mobility: Walking and moving around   Mobility: Walking and Moving Around Current Status [phone]) At least 20 percent but less than 40 percent impaired, limited or restricted   Mobility: Walking and Moving Around Goal Status [phone]) At least 20 percent but less than 40 percent impaired, limited or restricted   Mobility: Walking and Moving Around Discharge Status [phone]) At least 20 percent but less than 40 percent impaired, limited or restricted       Problem List Patient Active Problem List   Diagnosis Date Noted  . Pneumothorax on left 04/09/2017  . Pleural effusion 04/09/2017  . Pneumothorax 04/09/2017  . Occlusion of left pulmonary artery ([place]) 04/09/2017  . Neoplasm   . Essential hypertension   . Adenocarcinoma, lung ([place])   . Lung cancer ([place]) 10/02/2015  . Hypothyroidism 10/02/2015   . Arthritis of knee 01/06/2014  . Essential hypertension, benign 01/06/2014  . Encounter for therapeutic drug monitoring 12/17/2013  . Pulmonary emboli ([place]) 11/23/2013  . DVT (deep venous thrombosis) ([place]) 11/23/2013  . COPD (chronic obstructive pulmonary disease) with emphysema ([place]) 11/22/2013  . Acute respiratory failure with hypoxia ([place]) 11/22/2013  . Leukocytosis, unspecified 11/22/2013  . Anemia of chronic disease 11/22/2013  . CAP (community acquired pneumonia) 11/22/2013  . Chronic kidney disease (CKD), stage IV (severe) ([place]) 11/15/2013  . Adenocarcinoma of lung ([place]) 12/11/2012  . Pulmonary nodule 12/03/2012  . HYPOTHYROIDISM 06/19/2007  . HYPERTENSION 06/19/2007    Pieper Kasik 2017/05/30, 5:31 PM  [place], [place], [address] Phone: [phone]   Fax:  [phone]  Name: LECIA ESPERANZA MRN: [id number] Date of Birth: [birth year]  Serafina Royals, PT 2017-05-30 5:32 PM

## 2017-05-01 NOTE — Progress Notes (Signed)
[place] Clinical Social Work  Clinical Social Work met with patient/family and Futures trader at [facility] appointment to offer support and assess for psychosocial needs.  Medical oncologist reviewed patient's diagnosis and recommended treatment plan with patient/family.  Patient was accompanied by her son, Abe People.  She has three other children and lives alone.  Ms. Ranes has no emotional concerns at this time.  ONCBCN DISTRESS SCREENING 05/01/2017  Screening Type Initial Screening  Distress experienced in past week (1-10) 7  Physical Problem type Getting around;Changes in urination      Clinical Social Work briefly discussed [place] Work role and [employer] support programs/services.  Clinical Social Work encouraged patient to call with any additional questions or concerns.   Maryjean Morn, MSW, LCSW, OSW-C Clinical Social Worker [facility] [phone]

## 2017-05-02 ENCOUNTER — Encounter: Payer: Self-pay | Admitting: *Deleted

## 2017-05-02 NOTE — Progress Notes (Signed)
Oncology Nurse Navigator Documentation  Oncology Nurse Navigator Flowsheets 05/02/2017  Navigator Location [facility]  Navigator Encounter Type Clinic/MDC/I spoke with patient yesterday at thoracic clinic.  She has abnormal findings on recent scan.  Patient will have further work up and follow up with Dr. Julien Nordmann.  Her F/U appt is 05/26/17 with Dr. Julien Nordmann.    Abnormal Finding Date 04/09/2017  Multidisiplinary Clinic Date 05/01/2017  Patient Visit Type MedOnc  Treatment Phase Abnormal Scans  Barriers/Navigation Needs Education  Education Other  Interventions Education  Education Method Verbal  Acuity Level 2  Acuity Level 2 Educational needs  Time Spent with Patient 30

## 2017-05-05 DIAGNOSIS — Z0000 Encounter for general adult medical examination without abnormal findings: Secondary | ICD-10-CM | POA: Diagnosis not present

## 2017-05-06 ENCOUNTER — Encounter: Payer: Self-pay | Admitting: *Deleted

## 2017-05-06 NOTE — Progress Notes (Signed)
Oncology Nurse Navigator Documentation  Oncology Nurse Navigator Flowsheets 05/06/2017  Navigator Location [facility]  Navigator Encounter Type Other/I contacted authorization specialist to check on authorization for PET scan.    Treatment Phase Abnormal Scans  Barriers/Navigation Needs Coordination of Care  Interventions Coordination of Care  Coordination of Care Other  Acuity Level 2  Time Spent with Patient 15

## 2017-05-20 ENCOUNTER — Ambulatory Visit (HOSPITAL_COMMUNITY)
Admission: RE | Admit: 2017-05-20 | Discharge: 2017-05-20 | Disposition: A | Discharge status: Home / Self Care | Payer: Medicare Other | Source: Ambulatory Visit | Attending: Internal Medicine | Admitting: Internal Medicine

## 2017-05-20 DIAGNOSIS — I700 Atherosclerosis of aorta: Secondary | ICD-10-CM | POA: Diagnosis not present

## 2017-05-20 DIAGNOSIS — J439 Emphysema, unspecified: Secondary | ICD-10-CM | POA: Diagnosis not present

## 2017-05-20 DIAGNOSIS — C3492 Malignant neoplasm of unspecified part of left bronchus or lung: Secondary | ICD-10-CM | POA: Diagnosis not present

## 2017-05-20 DIAGNOSIS — N289 Disorder of kidney and ureter, unspecified: Secondary | ICD-10-CM | POA: Insufficient documentation

## 2017-05-20 DIAGNOSIS — Z923 Personal history of irradiation: Secondary | ICD-10-CM | POA: Insufficient documentation

## 2017-05-20 DIAGNOSIS — I2510 Atherosclerotic heart disease of native coronary artery without angina pectoris: Secondary | ICD-10-CM | POA: Diagnosis not present

## 2017-05-20 DIAGNOSIS — J323 Chronic sphenoidal sinusitis: Secondary | ICD-10-CM | POA: Insufficient documentation

## 2017-05-20 LAB — GLUCOSE, CAPILLARY: Glucose-Capillary: 95 mg/dL (ref 65–99)

## 2017-05-20 MED ORDER — FLUDEOXYGLUCOSE F - 18 (FDG) INJECTION
5.9000 | Freq: Once | INTRAVENOUS | Status: AC | PRN
  Administered 2017-05-20: 5.9 via INTRAVENOUS

## 2017-05-26 ENCOUNTER — Telehealth: Payer: Self-pay | Admitting: Internal Medicine

## 2017-05-26 ENCOUNTER — Telehealth: Payer: Self-pay | Admitting: Emergency Medicine

## 2017-05-26 ENCOUNTER — Encounter: Payer: Self-pay | Admitting: Internal Medicine

## 2017-05-26 ENCOUNTER — Ambulatory Visit (HOSPITAL_BASED_OUTPATIENT_CLINIC_OR_DEPARTMENT_OTHER): Payer: Medicare Other | Admitting: Internal Medicine

## 2017-05-26 VITALS — BP 156/54 | HR 64 | Temp 97.7°F | Resp 18 | Ht 64.0 in | Wt 117.9 lb

## 2017-05-26 DIAGNOSIS — Z85118 Personal history of other malignant neoplasm of bronchus and lung: Secondary | ICD-10-CM

## 2017-05-26 DIAGNOSIS — C3492 Malignant neoplasm of unspecified part of left bronchus or lung: Secondary | ICD-10-CM

## 2017-05-26 DIAGNOSIS — R918 Other nonspecific abnormal finding of lung field: Secondary | ICD-10-CM

## 2017-05-26 NOTE — Telephone Encounter (Signed)
atc pt at only number listed on file, no answer and no vm set up. JN has several openings next week that pt can be worked in to.. Wcb.

## 2017-05-26 NOTE — Progress Notes (Signed)
[place] [phone]   [phone]  OFFICE PROGRESS NOTE  Leamon Arnt, MD [address] [place] [address]  DIAGNOSIS: History of non-small cell lung cancer status post right upper lobectomy in 1981 as well as stage IB non-small cell lung cancer, adenocarcinoma of the left upper lobe status post curative radiotherapy.  PRIOR THERAPY: None  CURRENT THERAPY: None  INTERVAL HISTORY: Valerie Young 83 y.o. female returns to the clinic today for follow-up visit accompanied by her daughter and son. The patient is feeling fine today with no specific complaints except for occasional shortness of breath and left-sided chest pain. She denied having any recent weight loss or night sweats. She denied having any fever or chills. She has no nausea, vomiting, diarrhea or constipation. She had a PET scan performed recently and she is here for evaluation and discussion of her PET scan results and treatment options.  MEDICAL HISTORY: Past Medical History:  Diagnosis Date  . 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 Cancer ([place])    right lung ca - 30 years ago / left lung - 11/2012  . Cancer ([place])    Left lung  . CKD (chronic kidney disease)   . COPD (chronic obstructive pulmonary disease) ([place])   . DJD (degenerative joint disease)    knees  . H/O: lung cancer 1982   Right; treated with resection  . Hypertension   . Hypothyroidism   . PND (post-nasal drip)    Chronic    ALLERGIES:  is allergic to sulfamethoxazole-trimethoprim.  MEDICATIONS:  Current Outpatient Prescriptions  Medication Sig Dispense Refill  . acetaminophen (TYLENOL) 325 MG tablet Take 650 mg by mouth See admin instructions. TWO TO THREE TIMES A DAY    . albuterol (PROVENTIL HFA;VENTOLIN HFA) 108 (90 BASE) MCG/ACT inhaler Inhale 2 puffs into the lungs every 6 (six) hours as needed for wheezing or shortness of breath. (Patient not taking: Reported on  05/01/2017) 1 Inhaler 5  . amLODipine (NORVASC) 5 MG tablet TAKE 1 TABLET (5 MG TOTAL) BY MOUTH AT BEDTIME. (Patient taking differently: Take 5 mg by mouth in the morning) 90 tablet 0  . atenolol (TENORMIN) 50 MG tablet Take 1 tablet (50 mg total) by mouth daily. 90 tablet 3  . cetirizine (ZYRTEC) 10 MG tablet TAKE 1 TABLET (10 MG TOTAL) BY MOUTH AT BEDTIME. (Patient taking differently: Take 10 mg by mouth at bedtime as needed for allergies) 30 tablet 2  . Cholecalciferol (VITAMIN D-3) 1000 units CAPS Take 3,000 Units by mouth daily.    [name] guaiFENesin (MUCINEX) 600 MG 12 hr tablet Take 2 tablets (1,200 mg total) by mouth 2 (two) times daily. (Patient not taking: Reported on 04/29/2017) 20 tablet 0  . levothyroxine (SYNTHROID, LEVOTHROID) 50 MCG tablet Take 50 mcg by mouth daily before breakfast.   8  . pravastatin (PRAVACHOL) 40 MG tablet Take 40 mg by mouth daily.  3   No current facility-administered medications for this visit.     SURGICAL HISTORY:  Past Surgical History:  Procedure Laterality Date  . ABDOMINAL HYSTERECTOMY     PARTIAL  . CATARACT EXTRACTION, BILATERAL    . JOINT REPLACEMENT     RIGHT TOTAL KNEE  . RADIATION OF LEFT LUNG    . right lung cancer treated with resection  1982  . right thigh postoperative hematoma  02-2008  . RIGHT UPPER LOBE REMOVED    . THYROID  SURGERY    . THYROIDECTOMY    . TOTAL KNEE ARTHROPLASTY     Right  . VESICOVAGINAL FISTULA CLOSURE W/ TAH    . VIDEO BRONCHOSCOPY  12/08/2012   Procedure: VIDEO BRONCHOSCOPY WITH FLUORO;  Surgeon: Kathee Delton, MD;  [facility];  Service: Cardiopulmonary;  Laterality: Bilateral;    REVIEW OF SYSTEMS:  Constitutional: positive for fatigue Eyes: negative Ears, nose, mouth, throat, and face: negative Respiratory: positive for dyspnea on exertion and pleurisy/chest pain Cardiovascular: negative Gastrointestinal: negative Genitourinary:negative Integument/breast: negative Hematologic/lymphatic:  negative Musculoskeletal:positive for muscle weakness Neurological: negative Behavioral/Psych: negative Endocrine: negative Allergic/Immunologic: negative   PHYSICAL EXAMINATION: General appearance: alert, cooperative, fatigued and no distress Head: Normocephalic, without obvious abnormality, atraumatic Neck: no adenopathy, no JVD, supple, symmetrical, trachea midline and thyroid not enlarged, symmetric, no tenderness/mass/nodules Lymph nodes: Cervical, supraclavicular, and axillary nodes normal. Resp: clear to auscultation bilaterally Back: symmetric, no curvature. ROM normal. No CVA tenderness. Cardio: regular rate and rhythm, S1, S2 normal, no murmur, click, rub or gallop GI: soft, non-tender; bowel sounds normal; no masses,  no organomegaly Extremities: extremities normal, atraumatic, no cyanosis or edema Neurologic: Alert and oriented X 3, normal strength and tone. Normal symmetric reflexes. Normal coordination and gait  ECOG PERFORMANCE STATUS: 1 - Symptomatic but completely ambulatory  Blood pressure (!) 156/54, pulse 64, temperature 97.7 F (36.5 C), temperature source Oral, resp. rate 18, height 5\' 4"  (1.626 m), weight 117 lb 14.4 oz (53.5 kg), SpO2 98 %.  LABORATORY DATA: Lab Results  Component Value Date   WBC 12.3 (H) 04/11/2017   HGB 11.6 (L) 04/11/2017   HCT 37.4 04/11/2017   MCV 99.5 04/11/2017   PLT 201 04/11/2017      Chemistry      Component Value Date/Time   NA 140 04/11/2017 0348   K 3.4 (L) 04/11/2017 0348   CL 107 04/11/2017 0348   CO2 23 04/11/2017 0348   BUN 12 04/11/2017 0348   CREATININE 1.13 (H) 04/11/2017 0348   CREATININE 1.55 (H) 11/15/2013 1832      Component Value Date/Time   CALCIUM 9.4 04/11/2017 0348   ALKPHOS 64 04/10/2017 0247   AST 20 04/10/2017 0247   ALT 11 (L) 04/10/2017 0247   BILITOT 0.8 04/10/2017 0247       RADIOGRAPHIC STUDIES: Dg Chest 2 View  Result Date: 04/29/2017 CLINICAL DATA:  History of left pneumothorax.  EXAM: CHEST  2 VIEW COMPARISON:  Chest x-ray 04/11/2017, 04/10/2017, 04/09/2017 CT chest 04/09/2017 FINDINGS: Trace residual left apical pneumothorax. Chronic left upper lobe partial collapse. Hyperinflated lungs consistent with COPD. No other focal consolidation. No pleural effusion. No right pneumothorax. Stable cardiomediastinal silhouette. No acute osseous abnormality. IMPRESSION: 1. Trace residual left apical pneumothorax. Chronic left upper lobe partial collapse. Electronically Signed   By: Kathreen Devoid   On: 04/29/2017 14:48   Nm Pet Image Restag (ps) Skull Base To Thigh  Result Date: 05/20/2017 CLINICAL DATA:  Subsequent treatment strategy for left lung cancer. History of right lung cancer. EXAM: NUCLEAR MEDICINE PET SKULL BASE TO THIGH TECHNIQUE: 5.9 mCi F-18 FDG was injected intravenously. Full-ring PET imaging was performed from the skull base to thigh after the radiotracer. CT data was obtained and used for attenuation correction and anatomic localization. FASTING BLOOD GLUCOSE:  Value: 95 mg/dl COMPARISON:  Multiple exams, including 12/21/2012 and 04/09/2017 FINDINGS: NECK No hypermetabolic lymph nodes in the neck. Chronic right sphenoid sinusitis. CHEST The prior left pneumothorax has resolved. There is continued collapse  of the left upper lobe with a more rounded portion continuing to demonstrate low-grade activity characteristic for atelectatic or mildly consolidated lung. However, there is hypermetabolic activity along the posterior margin of the left hilum and tracking along the margins of a otherwise stable small cavity which is probably in the left lower lobe. Maximum SUV along the tangential retro hilar and marginal cavity region is 9.2, favoring malignancy. In the atelectatic left upper lobe the SUV is about 3.4. Persistent ground-glass density nodule in the right lower lobe measuring 1.8 by 0.9 cm on image 35/8, maximum SUV 2.7. Improved nodularity anteriorly in the right lower lobe  without significant metabolic activity. Coronary, aortic arch, and branch vessel atherosclerotic vascular disease. ABDOMEN/PELVIS No abnormal hypermetabolic activity within the liver, pancreas, adrenal glands, or spleen. No hypermetabolic lymph nodes in the abdomen or pelvis. Aortoiliac atherosclerotic vascular disease. 4 mm hyperdense lesion of the left kidney lower pole is technically nonspecific, although probably represents a small complex cyst. Two small to assess for metabolic activity. Sigmoid colon diverticulosis. SKELETON No focal hypermetabolic activity to suggest skeletal metastasis. Dextroconvex lumbar scoliosis with rotary component. Spurring of both hips. Left third rib irregularity related to known prior fracture. IMPRESSION: 1. Left retro hilar accentuated metabolic activity continuous with a cavitary lesion in the left lower lobe which likewise has accentuated metabolic activity along its margins. Appearance is suspicious for recurrent lung cancer. 2. Chronic atelectasis of the left upper lobe with only low-grade activity likely from atelectasis and prior radiation therapy. I do not see an obvious focal hyperenhancing portion of this left upper lobe. The significance of the poor vascular opacification of the left upper lobe pulmonary artery on the prior chest CT is uncertain, this could reflect vasoconstriction secondary to hypoventilation. 3. Low-grade activity (maximum SUV 2.7) associated with a slowly enlarging ground-glass density nodule medially in the right lower lobe, measuring 1.8 by 0.9 cm on image 35/8. Back on 02/28/2015 this measured 0.9 by 0.5 cm. Appearance suspicious for low-grade adenocarcinoma involving the right lower lobe. 4. Aortic Atherosclerosis (ICD10-I70.0) and Emphysema (ICD10-J43.9). Coronary atherosclerosis. 5. Dextroconvex lumbar scoliosis with rotary component. 6. 4 mm hyperdense lesion of the left kidney lower pole is probably a small complex cyst but is technically  nonspecific. 7. Chronic right sphenoid sinusitis. Electronically Signed   By: Van Clines M.D.   On: 05/20/2017 16:20    ASSESSMENT AND PLAN: This is a very pleasant 83 years old white female with recurrent non-small cell lung cancer status post surgical resection as well as curative radiotherapy and she recently presented with obstructive lesion in the left upper lobe. She had a PET scan performed recently for evaluation of this lesion. I personally and independently reviewed the scan images and discuss the results with the patient and her family. The PET scan showed left retrohilar accentuated metabolic activity continuous with a cavitary lesion in the left upper lobe that has accentuated metabolic activity along its margin and the appearance is suspicious for recurrent lung cancer. There was also chronic atelectasis of the left upper lobe with only low-grade activity. I recommended for the patient to see her pulmonologist Dr. Lamonte Sakai for consideration of repeat bronchoscopy and biopsy of the obstructive lesion in the left upper lobe. If the biopsy confirms recurrent lung cancer, the patient may benefit from radiotherapy to this area. I also referred her to see Dr. Tammi Klippel for evaluation and discussion of the radiotherapy option. I do think the patient is a good candidate for any systemic chemotherapy  at this point and she is not interested in this option. I would see her back for follow-up visit in 4 months for evaluation with repeat CT scan of the chest for restaging of her disease after the radiotherapy. She was advised to call immediately if she has any concerning symptoms in the interval The patient voices understanding of current disease status and treatment options and is in agreement with the current care plan.  All questions were answered. The patient knows to call the clinic with any problems, questions or concerns. We can certainly see the patient much sooner if necessary.  I spent  15 minutes counseling the patient face to face. The total time spent in the appointment was 25 minutes.  Disclaimer: This note was dictated with voice recognition software. Similar sounding words can inadvertently be transcribed and may not be corrected upon review.

## 2017-05-26 NOTE — Telephone Encounter (Signed)
Scheduled appt per 7/2 los - Gave patient AVS and calender per los.

## 2017-05-27 NOTE — Telephone Encounter (Signed)
Attempted to contact pt x2. No answer, no option to leave a message. Will try back.

## 2017-05-29 NOTE — Telephone Encounter (Signed)
Attempted to contact pt x3. No answer, no option to leave a message. Will try back.

## 2017-05-30 ENCOUNTER — Ambulatory Visit (HOSPITAL_COMMUNITY): Admission: RE | Admit: 2017-05-30 | Payer: Medicare Other | Source: Ambulatory Visit

## 2017-05-30 NOTE — Telephone Encounter (Signed)
We have attempted to contact the pt several times with no success. Per triage protocol, message will be closed.  

## 2017-06-02 ENCOUNTER — Telehealth: Payer: Self-pay | Admitting: Emergency Medicine

## 2017-06-02 NOTE — Telephone Encounter (Signed)
RB  Please Advise-  Pt called in concerned because she had a PET scan and Dr. Julien Nordmann wanted her to have a biopsy but when they tried to schedule her they stated that your first available was in August. She felt this is too far out and wanted to know from you if you wanted her to do it sooner. We was able to get her a sooner appt on 7/26

## 2017-06-03 NOTE — Telephone Encounter (Signed)
Pt can keep her OV with RB on 06/19/2017. No need to overbook. Pt is aware. Nothing further was needed.

## 2017-06-03 NOTE — Telephone Encounter (Signed)
Pt is scheduled for 06/19/17.   RB please advise if this apt is okay, or if you would still like to overbook pt. Thanks.

## 2017-06-03 NOTE — Telephone Encounter (Signed)
Please overbook her and we will review, decide whether to pursue FOB

## 2017-06-19 ENCOUNTER — Ambulatory Visit (INDEPENDENT_AMBULATORY_CARE_PROVIDER_SITE_OTHER): Payer: Medicare Other | Admitting: Emergency Medicine

## 2017-06-19 ENCOUNTER — Encounter: Payer: Self-pay | Admitting: Emergency Medicine

## 2017-06-19 DIAGNOSIS — C3412 Malignant neoplasm of upper lobe, left bronchus or lung: Secondary | ICD-10-CM | POA: Diagnosis not present

## 2017-06-19 NOTE — Patient Instructions (Signed)
We discussed your PET scan today and the possibility of performing a bronchoscopy to investigate the two areas of abnormality in your chest.  We will hold off on a procedure for now, continue to follow your scans to look for interval changes. We may change our mind if new symptoms evolve. Likewise we may decide to just treat the symptoms and to avoid any further cancer treatments.  Continue your current medications.  Follow with Dr Lamonte Sakai in 4 months or sooner if you have any problems.

## 2017-06-19 NOTE — Assessment & Plan Note (Signed)
Extended discussion today regarding her PET scan results, pros and cons of attempting bronchoscopy to achieve tissue diagnosis and to facilitate treatment. The most sensitive test here would be a navigational bronchoscopy which would necessitate general anesthesia. I would like to avoid this if possible. A standard bronchoscopy under conscious sedation with possibly give useful tissue, would be less sensitive. Based on our discussion and her current lack of symptoms she would like to wait and watch, avoid any invasive procedures at this time. She has a repeat CT scan or any scheduled in October. Will follow with her

## 2017-06-19 NOTE — Progress Notes (Signed)
Subjective:    Patient ID: Valerie Young, female    DOB: [birth year], 83 y.o.   MRN: [id number]  HPI 83 yo woman, former tobacco (78 pk-yrs), hx NSCLCA s/p RUL resection, LUL NSCLCA s/p XRT with curative intent. Has been followed by Dr Gwenette Greet for COPD.  She was admitted last month for a CAP noted on CT chest, was treated with abx and steroids. She felt better at discharge, has done well until 12/6 when she developed fatigue, no cough, no fever. She s not on maintenance BD's, has albuterol to use prn. She does not feel that she needs scheduled BD's when she is at baseline. She denies SOB right now.   ROV 04/09/16 -- patient follows up for history of possible cell lung cancer bilaterally with treatment as above. She also has history of COPD.  She had a repeat Ct chest 04/04/16 that showed improvement in her RLL GGI although there is still a residual rounded area of GGI in the medial RLL. She states that her breathing is doing well, not limiting her. She has not needed any albuterol.   ROV 03/06/17 -- follow up visit for COPD and NSCLCA (RUL and LUL). Have been following some RLL changes on Ct chest. She underwent a scan on 12/02/16 that showed LUL radiation change, LLL cavitary lesion, ? Bullous. The RLL GGI and the bullous are stable on my review. She is currently on albuterol only, uses this very rarely. No flares, no hospitalizations. Minimal daily sx.   ROV 06/19/17 -- patient follows up today for her history of COPD and also non-small cell lung cancer that was treated remotely with a right upper lobe lobectomy and then subsequently with radiation therapy to the left upper lobe. Have been following an area of scar/consolidation in the left upper lobe, and new groundglass posterior/medial lesion in the right lower lobe, and most importantly a rounded cavitary lesion in the left lower lobe that appears to have been slowly increasing in size. A PET scan performed on 05/20/17 was personally reviewed. This shows  that there is hypermetabolism associated with the left lower lobe cavitary lesion. There is also some low-grade activity associated with the right lower lobe groundglass lesion. She is currently asymptomatic.    Review of Systems As per HPI       Objective:   Physical Exam Vitals:   06/19/17 1146  BP: 118/62  Pulse: 66  SpO2: 95%  Weight: 117 lb 3.2 oz (53.2 kg)  Height: 5\' 4"  (1.626 m)   Gen: Pleasant, thin elderly, in no distress,  normal affect  ENT: No lesions,  mouth clear,  oropharynx clear, no postnasal drip  Neck: No JVD, no TMG, no carotid bruits  Lungs: No use of accessory muscles, clear without rales or rhonchi  Cardiovascular: RRR, heart sounds normal, no murmur or gallops, no peripheral edema  Musculoskeletal: No deformities, no cyanosis or clubbing  Neuro: alert, non focal  Skin: Warm, no lesions or rashes  05/20/17 PET COMPARISON:  Multiple exams, including 12/21/2012 and 04/09/2017  FINDINGS: NECK  No hypermetabolic lymph nodes in the neck.  Chronic right sphenoid sinusitis.  CHEST  The prior left pneumothorax has resolved. There is continued collapse of the left upper lobe with a more rounded portion continuing to demonstrate low-grade activity characteristic for atelectatic or mildly consolidated lung. However, there is hypermetabolic activity along the posterior margin of the left hilum and tracking along the margins of a otherwise stable small cavity which is probably  in the left lower lobe. Maximum SUV along the tangential retro hilar and marginal cavity region is 9.2, favoring malignancy. In the atelectatic left upper lobe the SUV is about 3.4.  Persistent ground-glass density nodule in the right lower lobe measuring 1.8 by 0.9 cm on image 35/8, maximum SUV 2.7.  Improved nodularity anteriorly in the right lower lobe without significant metabolic activity.  Coronary, aortic arch, and branch vessel atherosclerotic  vascular disease.  ABDOMEN/PELVIS  No abnormal hypermetabolic activity within the liver, pancreas, adrenal glands, or spleen. No hypermetabolic lymph nodes in the abdomen or pelvis.  Aortoiliac atherosclerotic vascular disease. 4 mm hyperdense lesion of the left kidney lower pole is technically nonspecific, although probably represents a small complex cyst. Two small to assess for metabolic activity.  Sigmoid colon diverticulosis.  SKELETON  No focal hypermetabolic activity to suggest skeletal metastasis.  Dextroconvex lumbar scoliosis with rotary component. Spurring of both hips. Left third rib irregularity related to known prior fracture.  IMPRESSION: 1. Left retro hilar accentuated metabolic activity continuous with a cavitary lesion in the left lower lobe which likewise has accentuated metabolic activity along its margins. Appearance is suspicious for recurrent lung cancer. 2. Chronic atelectasis of the left upper lobe with only low-grade activity likely from atelectasis and prior radiation therapy. I do not see an obvious focal hyperenhancing portion of this left upper lobe. The significance of the poor vascular opacification of the left upper lobe pulmonary artery on the prior chest CT is uncertain, this could reflect vasoconstriction secondary to hypoventilation. 3. Low-grade activity (maximum SUV 2.7) associated with a slowly enlarging ground-glass density nodule medially in the right lower lobe, measuring 1.8 by 0.9 cm on image 35/8. Back on 02/28/2015 this measured 0.9 by 0.5 cm. Appearance suspicious for low-grade adenocarcinoma involving the right lower lobe. 4. Aortic Atherosclerosis (ICD10-I70.0) and Emphysema (ICD10-J43.9). Coronary atherosclerosis. 5. Dextroconvex lumbar scoliosis with rotary component. 6. 4 mm hyperdense lesion of the left kidney lower pole is probably a small complex cyst but is technically nonspecific. 7. Chronic right sphenoid  sinusitis.      Assessment & Plan:  Lung cancer [facility]) Extended discussion today regarding her PET scan results, pros and cons of attempting bronchoscopy to achieve tissue diagnosis and to facilitate treatment. The most sensitive test here would be a navigational bronchoscopy which would necessitate general anesthesia. I would like to avoid this if possible. A standard bronchoscopy under conscious sedation with possibly give useful tissue, would be less sensitive. Based on our discussion and her current lack of symptoms she would like to wait and watch, avoid any invasive procedures at this time. She has a repeat CT scan or any scheduled in October. Will follow with her  Baltazar Apo, MD, PhD 06/19/2017, 12:15 PM [facility] [phone] or if no answer [phone]

## 2017-07-29 DIAGNOSIS — I10 Essential (primary) hypertension: Secondary | ICD-10-CM | POA: Diagnosis not present

## 2017-08-05 ENCOUNTER — Ambulatory Visit: Payer: Medicare Other | Admitting: Emergency Medicine

## 2017-08-13 DIAGNOSIS — Z23 Encounter for immunization: Secondary | ICD-10-CM | POA: Diagnosis not present

## 2017-09-23 ENCOUNTER — Ambulatory Visit (HOSPITAL_COMMUNITY)
Admission: RE | Admit: 2017-09-23 | Discharge: 2017-09-23 | Disposition: A | Discharge status: Home / Self Care | Payer: Medicare Other | Source: Ambulatory Visit | Attending: Internal Medicine | Admitting: Internal Medicine

## 2017-09-23 ENCOUNTER — Other Ambulatory Visit: Payer: Medicare Other

## 2017-09-23 ENCOUNTER — Encounter (HOSPITAL_COMMUNITY): Payer: Self-pay

## 2017-09-23 DIAGNOSIS — C3492 Malignant neoplasm of unspecified part of left bronchus or lung: Secondary | ICD-10-CM

## 2017-09-30 ENCOUNTER — Ambulatory Visit: Payer: Medicare Other | Admitting: Internal Medicine

## 2017-09-30 ENCOUNTER — Telehealth: Payer: Self-pay | Admitting: Internal Medicine

## 2017-09-30 NOTE — Telephone Encounter (Signed)
Patient called in wanting to reschedule for appointment that was missed

## 2017-10-08 ENCOUNTER — Encounter (HOSPITAL_COMMUNITY): Payer: Self-pay

## 2017-10-08 ENCOUNTER — Other Ambulatory Visit (HOSPITAL_BASED_OUTPATIENT_CLINIC_OR_DEPARTMENT_OTHER): Payer: Medicare Other

## 2017-10-08 ENCOUNTER — Ambulatory Visit (HOSPITAL_COMMUNITY)
Admission: RE | Admit: 2017-10-08 | Discharge: 2017-10-08 | Disposition: A | Discharge status: Home / Self Care | Payer: Medicare Other | Source: Ambulatory Visit | Attending: Internal Medicine | Admitting: Internal Medicine

## 2017-10-08 DIAGNOSIS — R911 Solitary pulmonary nodule: Secondary | ICD-10-CM | POA: Insufficient documentation

## 2017-10-08 DIAGNOSIS — Z85118 Personal history of other malignant neoplasm of bronchus and lung: Secondary | ICD-10-CM | POA: Diagnosis present

## 2017-10-08 DIAGNOSIS — R918 Other nonspecific abnormal finding of lung field: Secondary | ICD-10-CM

## 2017-10-08 DIAGNOSIS — C3492 Malignant neoplasm of unspecified part of left bronchus or lung: Secondary | ICD-10-CM | POA: Insufficient documentation

## 2017-10-08 DIAGNOSIS — I700 Atherosclerosis of aorta: Secondary | ICD-10-CM | POA: Diagnosis not present

## 2017-10-08 DIAGNOSIS — N189 Chronic kidney disease, unspecified: Secondary | ICD-10-CM

## 2017-10-08 DIAGNOSIS — J439 Emphysema, unspecified: Secondary | ICD-10-CM | POA: Insufficient documentation

## 2017-10-08 LAB — CBC WITH DIFFERENTIAL/PLATELET
BASO%: 0.7 % (ref 0.0–2.0)
Basophils Absolute: 0.1 10*3/uL (ref 0.0–0.1)
EOS ABS: 0.1 10*3/uL (ref 0.0–0.5)
EOS%: 1.9 % (ref 0.0–7.0)
HEMATOCRIT: 40.3 % (ref 34.8–46.6)
HGB: 13.2 g/dL (ref 11.6–15.9)
LYMPH#: 1.5 10*3/uL (ref 0.9–3.3)
LYMPH%: 19.7 % (ref 14.0–49.7)
MCH: 32.4 pg (ref 25.1–34.0)
MCHC: 32.7 g/dL (ref 31.5–36.0)
MCV: 99 fL (ref 79.5–101.0)
MONO#: 0.6 10*3/uL (ref 0.1–0.9)
MONO%: 7.5 % (ref 0.0–14.0)
NEUT#: 5.5 10*3/uL (ref 1.5–6.5)
NEUT%: 70.2 % (ref 38.4–76.8)
PLATELETS: 239 10*3/uL (ref 145–400)
RBC: 4.07 10*6/uL (ref 3.70–5.45)
RDW: 13 % (ref 11.2–14.5)
WBC: 7.8 10*3/uL (ref 3.9–10.3)

## 2017-10-08 LAB — COMPREHENSIVE METABOLIC PANEL
ALK PHOS: 88 U/L (ref 40–150)
ALT: 11 U/L (ref 0–55)
ANION GAP: 8 meq/L (ref 3–11)
AST: 19 U/L (ref 5–34)
Albumin: 4 g/dL (ref 3.5–5.0)
BILIRUBIN TOTAL: 0.67 mg/dL (ref 0.20–1.20)
BUN: 21.9 mg/dL (ref 7.0–26.0)
CALCIUM: 10.2 mg/dL (ref 8.4–10.4)
CO2: 26 mEq/L (ref 22–29)
CREATININE: 1.4 mg/dL — AB (ref 0.6–1.1)
Chloride: 109 mEq/L (ref 98–109)
EGFR: 33 mL/min/{1.73_m2} — AB (ref >60)
GLUCOSE: 101 mg/dL (ref 70–140)
Potassium: 4.5 mEq/L (ref 3.5–5.1)
Sodium: 143 mEq/L (ref 136–145)
TOTAL PROTEIN: 7.4 g/dL (ref 6.4–8.3)

## 2017-10-08 MED ORDER — IOPAMIDOL (ISOVUE-300) INJECTION 61%
INTRAVENOUS | Status: AC
  Administered 2017-10-08: 60 mL via INTRAVENOUS
  Filled 2017-10-08: qty 75

## 2017-10-08 MED ORDER — IOPAMIDOL (ISOVUE-300) INJECTION 61%
75.0000 mL | Freq: Once | INTRAVENOUS | Status: AC | PRN
  Administered 2017-10-08: 60 mL via INTRAVENOUS

## 2017-10-17 ENCOUNTER — Encounter: Payer: Self-pay | Admitting: *Deleted

## 2017-10-17 NOTE — Progress Notes (Signed)
Oncology Nurse Navigator Documentation  Oncology Nurse Navigator Flowsheets 10/17/2017  Navigator Location [facility]  Navigator Encounter Type Other/Ms. Rabalais was a no show on 11/6.  I notified scheduling to call patient and arrange a follow up appt with Dr. Julien Nordmann.   Treatment Phase Follow-up  Barriers/Navigation Needs Coordination of Care  Interventions Coordination of Care  Coordination of Care Other  Acuity Level 1  Time Spent with Patient 15

## 2017-10-21 ENCOUNTER — Telehealth: Payer: Self-pay | Admitting: Internal Medicine

## 2017-10-21 ENCOUNTER — Encounter: Payer: Self-pay | Admitting: Emergency Medicine

## 2017-10-21 ENCOUNTER — Ambulatory Visit (INDEPENDENT_AMBULATORY_CARE_PROVIDER_SITE_OTHER): Payer: Medicare Other | Admitting: Emergency Medicine

## 2017-10-21 DIAGNOSIS — R911 Solitary pulmonary nodule: Secondary | ICD-10-CM

## 2017-10-21 DIAGNOSIS — J309 Allergic rhinitis, unspecified: Secondary | ICD-10-CM | POA: Insufficient documentation

## 2017-10-21 NOTE — Assessment & Plan Note (Signed)
She is concerned about the cost of cetirizine.  We will try changing to loratadine.  She uses as needed.

## 2017-10-21 NOTE — Patient Instructions (Addendum)
Stop cetirizine Start taking loratadine 10 mg daily as needed for allergy symptoms.  You can get this medication at the [facility]              Keep albuterol available to use 2 puffs if needed for shortness of breath We will plan to repeat your CT scan of the chest without contrast in June 2019. Follow with Dr Lamonte Sakai in June 2019 to review your CT scan or sooner if you have any problems

## 2017-10-21 NOTE — Progress Notes (Signed)
Subjective:    Patient ID: Valerie Young, female    DOB: 1929-09-26, 83 y.o.   MRN: [id number]  HPI 83 yo woman, former tobacco (8 pk-yrs), hx NSCLCA s/p RUL resection, LUL NSCLCA s/p XRT with curative intent. Has been followed by Dr Gwenette Greet for COPD.  She was admitted last month for a CAP noted on CT chest, was treated with abx and steroids. She felt better at discharge, has done well until 12/6 when she developed fatigue, no cough, no fever. She s not on maintenance BD's, has albuterol to use prn. She does not feel that she needs scheduled BD's when she is at baseline. She denies SOB right now.   ROV 04/09/16 -- patient follows up for history of possible cell lung cancer bilaterally with treatment as above. She also has history of COPD.  She had a repeat Ct chest 04/04/16 that showed improvement in her RLL GGI although there is still a residual rounded area of GGI in the medial RLL. She states that her breathing is doing well, not limiting her. She has not needed any albuterol.   ROV 03/06/17 -- follow up visit for COPD and NSCLCA (RUL and LUL). Have been following some RLL changes on Ct chest. She underwent a scan on 12/02/16 that showed LUL radiation change, LLL cavitary lesion, ? Bullous. The RLL GGI and the bullous are stable on my review. She is currently on albuterol only, uses this very rarely. No flares, no hospitalizations. Minimal daily sx.   ROV 06/19/17 -- patient follows up today for her history of COPD and also recurrent non-small cell lung cancer that was treated remotely with a right upper lobe lobectomy and then subsequently with radiation therapy to the left upper lobe. Have been following an area of scar/consolidation in the left upper lobe, and new groundglass posterior/medial lesion in the right lower lobe, and most importantly a rounded cavitary lesion in the left lower lobe that appears to have been slowly increasing in size. A PET scan performed on 05/20/17 was personally reviewed.  This shows that there is hypermetabolism associated with the left lower lobe cavitary lesion. There is also some low-grade activity associated with the right lower lobe groundglass lesion. She is currently asymptomatic.   ROV 10/21/17 --83 year old woman with a history of COPD as well as recurrent non-small cell lung cancer.  She has been treated remotely with a right upper lobe lobectomy, more recently with left upper lobe radiation therapy (for cure).  Subsequent CT scans and PET scans have shown hypermetabolic left lower lobe cavitary lesion.  She had a repeat CT scan on 10/08/17 that I have personally reviewed.  This shows no significant interval change compared with a PET scan done 05/20/17.  She is having a bit more more exertional SOB than in prior years, but she stays very active. She has albuterol that she can use but has not taken. She is having some nervousness in the am and afternoons.    Review of Systems As per HPI     Objective:   Physical Exam Vitals:   10/21/17 1604 10/21/17 1605  BP:  136/84  Pulse:  66  SpO2:  93%  Weight: 121 lb (54.9 kg)   Height: 5\' 4"  (1.626 m)    Gen: Pleasant, thin elderly, in no distress,  normal affect  ENT: No lesions,  mouth clear,  oropharynx clear, no postnasal drip  Neck: No JVD, no stridor  Lungs: No use of accessory muscles, clear without  rales or rhonchi  Cardiovascular: RRR, heart sounds normal, no murmur or gallops, no peripheral edema  Musculoskeletal: No deformities, no cyanosis or clubbing  Neuro: alert, non focal  Skin: Warm, no lesions or rashes    CT chest 10/08/17 --  COMPARISON:  PET-CT 05/20/2017.  Chest CT 04/09/2017.  FINDINGS: Cardiovascular: Heart size upper normal. Coronary artery calcification is evident. Atherosclerotic calcification is noted in the wall of the thoracic aorta. The esophagus has normal imaging features. There is no axillary lymphadenopathy.  Mediastinum/Nodes: No mediastinal  lymphadenopathy. No right hilar lymphadenopathy. Abnormal soft tissue attenuation in the left hilum compatible with treatment related change.  Lungs/Pleura: Biapical pleural-parenchymal scarring again noted.  Volume loss and consolidation left upper lobe is similar to prior. Left retro hilar cavitary lesion seen on prior PET-CT is similar today, measuring 3.1 cm diameter.  Subpleural paraspinal ground-glass attenuation in the right lower lobe persists and is similar today, measuring 1.6 x 0.8 cm compared to 1.8 x 0.9 cm previously.  Emphysema noted bilaterally.  Upper Abdomen: Unremarkable.  Musculoskeletal: Bone windows reveal no worrisome lytic or sclerotic osseous lesions.  IMPRESSION: 1. No substantial interval change since PET-CT of 05/20/2017. 2. Left retro hilar cavitary lesion is similar. 3. Post treatment changes left upper lobe with volume loss and fibrosis. Similar to prior. 4. Paraspinal right lower lobe ground-glass nodule is unchanged in the interval. 5.  Emphysema. (ICD10-J43.9) 6.  Aortic Atherosclerois (ICD10-170.0)     Assessment & Plan:  COPD (chronic obstructive pulmonary disease) with emphysema (HCC) Discussed maintenance bronchodilators with her.  She would like to avoid.  She does still have albuterol available to use as needed.  Adenocarcinoma, lung ([place]) History of recurrent non-small cell lung cancer.  We have been following a cavitary left lower lobe lesion.  Unchanged on CT scan from 10/08/17.  We will plan to repeat her scan in June 2019 to evaluate for interval change.  Allergic rhinitis She is concerned about the cost of cetirizine.  We will try changing to loratadine.  She uses as needed.  Baltazar Apo, MD, PhD 10/21/2017, 4:32 PM [facility] [phone] or if no answer [phone]

## 2017-10-21 NOTE — Assessment & Plan Note (Signed)
Discussed maintenance bronchodilators with her.  She would like to avoid.  She does still have albuterol available to use as needed.

## 2017-10-21 NOTE — Telephone Encounter (Signed)
Scheduled appt per 11/23 sch msg. Patient does not have a voicemail box that is set up - I am sending a confirmation letter in the mail.

## 2017-10-21 NOTE — Assessment & Plan Note (Signed)
History of recurrent non-small cell lung cancer.  We have been following a cavitary left lower lobe lesion.  Unchanged on CT scan from 10/08/17.  We will plan to repeat her scan in June 2019 to evaluate for interval change.

## 2017-10-29 ENCOUNTER — Ambulatory Visit: Payer: Medicare Other | Admitting: Internal Medicine

## 2018-01-14 DIAGNOSIS — I10 Essential (primary) hypertension: Secondary | ICD-10-CM | POA: Diagnosis not present

## 2018-01-14 DIAGNOSIS — E039 Hypothyroidism, unspecified: Secondary | ICD-10-CM | POA: Diagnosis not present

## 2018-01-14 DIAGNOSIS — J310 Chronic rhinitis: Secondary | ICD-10-CM | POA: Diagnosis not present

## 2018-01-14 DIAGNOSIS — E559 Vitamin D deficiency, unspecified: Secondary | ICD-10-CM | POA: Diagnosis not present

## 2018-01-14 DIAGNOSIS — E782 Mixed hyperlipidemia: Secondary | ICD-10-CM | POA: Diagnosis not present

## 2018-01-14 DIAGNOSIS — N183 Chronic kidney disease, stage 3 (moderate): Secondary | ICD-10-CM | POA: Diagnosis not present

## 2018-04-06 ENCOUNTER — Telehealth: Payer: Self-pay | Admitting: Emergency Medicine

## 2018-04-06 NOTE — Telephone Encounter (Signed)
Called and spoke with pt's daughter who is requesting to have pt's CT scan changed from current location of [place] [address] on [address] to the [employer]  April stated the cancer center is easier for pt due to pt having someone that will park her car if she goes to the scan herself.  Pt is currently scheduled to have the scan 6/3.  Stated to April I would see if our coordinators can change the location of the scan from [place] CT to the [place] for pt.  Pt's OV with RB was also changed as pt is to come in for the OV after the scan has been done in June 2019.  Pt's OV is now 6/24 with RB instead of current OV that was scheduled tomorrow, 5/14.  PCC's please advise if pt's CT location can be changed to cancer center and if there is anything that needs to be done by [place] regarding a new order. Thanks!

## 2018-04-07 ENCOUNTER — Ambulatory Visit: Payer: Medicare Other | Admitting: Emergency Medicine

## 2018-04-08 ENCOUNTER — Other Ambulatory Visit: Payer: Self-pay | Admitting: *Deleted

## 2018-04-08 DIAGNOSIS — R911 Solitary pulmonary nodule: Secondary | ICD-10-CM

## 2018-04-08 NOTE — Progress Notes (Signed)
Placed new order for CT due to location change

## 2018-04-08 NOTE — Telephone Encounter (Signed)
I called and spoke with April just to confirm that she meant for the CT to be scheduled at [facility]. The new CT appt is at [facility] on 04/29/2018 @ 3:30pm with 3:15pm arrival time. April is aware of the appt and location change for her Mom's CT

## 2018-04-27 ENCOUNTER — Other Ambulatory Visit: Payer: Medicare Other

## 2018-04-29 ENCOUNTER — Ambulatory Visit (HOSPITAL_COMMUNITY)
Admission: RE | Admit: 2018-04-29 | Discharge: 2018-04-29 | Disposition: A | Discharge status: Home / Self Care | Payer: Medicare Other | Source: Ambulatory Visit | Attending: Emergency Medicine | Admitting: Emergency Medicine

## 2018-04-29 ENCOUNTER — Encounter (HOSPITAL_COMMUNITY): Payer: Self-pay

## 2018-04-29 DIAGNOSIS — I700 Atherosclerosis of aorta: Secondary | ICD-10-CM | POA: Insufficient documentation

## 2018-04-29 DIAGNOSIS — J439 Emphysema, unspecified: Secondary | ICD-10-CM | POA: Insufficient documentation

## 2018-04-29 DIAGNOSIS — I2510 Atherosclerotic heart disease of native coronary artery without angina pectoris: Secondary | ICD-10-CM | POA: Insufficient documentation

## 2018-04-29 DIAGNOSIS — R918 Other nonspecific abnormal finding of lung field: Secondary | ICD-10-CM | POA: Diagnosis not present

## 2018-04-29 DIAGNOSIS — C3492 Malignant neoplasm of unspecified part of left bronchus or lung: Secondary | ICD-10-CM | POA: Diagnosis not present

## 2018-04-29 DIAGNOSIS — R911 Solitary pulmonary nodule: Secondary | ICD-10-CM

## 2018-05-18 ENCOUNTER — Encounter: Payer: Self-pay | Admitting: Emergency Medicine

## 2018-05-18 ENCOUNTER — Ambulatory Visit (INDEPENDENT_AMBULATORY_CARE_PROVIDER_SITE_OTHER): Payer: Medicare Other | Admitting: Emergency Medicine

## 2018-05-18 DIAGNOSIS — J432 Centrilobular emphysema: Secondary | ICD-10-CM

## 2018-05-18 DIAGNOSIS — R911 Solitary pulmonary nodule: Secondary | ICD-10-CM | POA: Diagnosis not present

## 2018-05-18 DIAGNOSIS — C3492 Malignant neoplasm of unspecified part of left bronchus or lung: Secondary | ICD-10-CM

## 2018-05-18 NOTE — Assessment & Plan Note (Signed)
We are following right lower lobe and left lower lobe pulmonary nodules, cavitary lesion by serial CT scan.  There is a new small nodule adjacent to her left lower lobe cavity.  We will repeat her CT in 6 months to look for interval change.

## 2018-05-18 NOTE — Assessment & Plan Note (Addendum)
Keep albuterol available to use 2 puffs if needed for shortness of breath.  No schedule bronchodilator at this time

## 2018-05-18 NOTE — Progress Notes (Signed)
Subjective:    Patient ID: Valerie Young, female    DOB: Apr 07, 1929, 84 y.o.   MRN: [id number]  HPI  ROV 10/21/17 --84 year old woman with a history of COPD as well as recurrent non-small cell lung cancer.  She has been treated remotely with a right upper lobe lobectomy, more recently with left upper lobe radiation therapy (for cure).  Subsequent CT scans and PET scans have shown hypermetabolic left lower lobe cavitary lesion.  She had a repeat CT scan on 10/08/17 that I have personally reviewed.  This shows no significant interval change compared with a PET scan done 05/20/17.  She is having a bit more more exertional SOB than in prior years, but she stays very active. She has albuterol that she can use but has not taken. She is having some nervousness in the am and afternoons.   ROV 05/18/18 --Valerie Young has a history of COPD, recurrent non-small cell lung cancer with a history of a right upper lobe lobectomy and more recently left upper lobe SBRT.  She has subsequently had CT scans and PET scans that have shown hypermetabolic left lower lobe cavitary lesion.  Repeat CT was done on 04/29/2018 which I have reviewed.  This showed stable left paramediastinal radiation changes and a stable thin-walled cavitary lesion in the posterior left midlung.  A new 8 x 6 mm left lower lobe pulmonary nodule is present.  There is a stable right lower lobe groundglass opacity measuring 1.4 x 0.8 cm. She is doing well, has good energy. Minimal exertional SOB. She never uses albuterol.    Review of Systems As per HPI     Objective:   Physical Exam Vitals:   05/18/18 1625  BP: 130/86  Pulse: 60  SpO2: 93%  Weight: 121 lb (54.9 kg)  Height: 5\' 4"  (1.626 m)   Gen: Pleasant, thin elderly, in no distress,  normal affect  ENT: No lesions,  mouth clear,  oropharynx clear, no postnasal drip  Neck: No JVD, no stridor  Lungs: No use of accessory muscles, clear without rales or rhonchi  Cardiovascular: RRR,  heart sounds normal, no murmur or gallops, no peripheral edema  Musculoskeletal: No deformities, no cyanosis or clubbing  Neuro: alert, non focal  Skin: Warm, no lesions or rashes    CT chest 10/08/17 --  COMPARISON:  PET-CT 05/20/2017.  Chest CT 04/09/2017.  FINDINGS: Cardiovascular: Heart size upper normal. Coronary artery calcification is evident. Atherosclerotic calcification is noted in the wall of the thoracic aorta. The esophagus has normal imaging features. There is no axillary lymphadenopathy.  Mediastinum/Nodes: No mediastinal lymphadenopathy. No right hilar lymphadenopathy. Abnormal soft tissue attenuation in the left hilum compatible with treatment related change.  Lungs/Pleura: Biapical pleural-parenchymal scarring again noted.  Volume loss and consolidation left upper lobe is similar to prior. Left retro hilar cavitary lesion seen on prior PET-CT is similar today, measuring 3.1 cm diameter.  Subpleural paraspinal ground-glass attenuation in the right lower lobe persists and is similar today, measuring 1.6 x 0.8 cm compared to 1.8 x 0.9 cm previously.  Emphysema noted bilaterally.  Upper Abdomen: Unremarkable.  Musculoskeletal: Bone windows reveal no worrisome lytic or sclerotic osseous lesions.  IMPRESSION: 1. No substantial interval change since PET-CT of 05/20/2017. 2. Left retro hilar cavitary lesion is similar. 3. Post treatment changes left upper lobe with volume loss and fibrosis. Similar to prior. 4. Paraspinal right lower lobe ground-glass nodule is unchanged in the interval. 5.  Emphysema. (ICD10-J43.9) 6.  Aortic Atherosclerois (  ICD10-170.0)     Assessment & Plan:  COPD (chronic obstructive pulmonary disease) with emphysema (HCC) Keep albuterol available to use 2 puffs if needed for shortness of breath.  No schedule bronchodilator at this time  Adenocarcinoma of lung We are following right lower lobe and left lower lobe  pulmonary nodules, cavitary lesion by serial CT scan.  There is a new small nodule adjacent to her left lower lobe cavity.  We will repeat her CT in 6 months to look for interval change.  Baltazar Apo, MD, PhD 05/18/2018, 4:54 PM [facility] [phone] or if no answer [phone]

## 2018-05-18 NOTE — Patient Instructions (Signed)
We will repeat your CT scan of the chest without contrast in December 2016 at [facility] to follow your pulmonary nodules. Keep albuterol available to use 2 puffs if needed for shortness of breath, wheezing, chest tightness.  We will refill this medication for you today. Follow-up with Dr. Lamonte Sakai in December after the CT scan to review the results together, sooner if you have any problems

## 2018-07-15 DIAGNOSIS — I10 Essential (primary) hypertension: Secondary | ICD-10-CM | POA: Diagnosis not present

## 2018-07-15 DIAGNOSIS — J310 Chronic rhinitis: Secondary | ICD-10-CM | POA: Diagnosis not present

## 2018-07-15 DIAGNOSIS — E039 Hypothyroidism, unspecified: Secondary | ICD-10-CM | POA: Diagnosis not present

## 2018-07-15 DIAGNOSIS — E782 Mixed hyperlipidemia: Secondary | ICD-10-CM | POA: Diagnosis not present

## 2018-07-15 DIAGNOSIS — N183 Chronic kidney disease, stage 3 (moderate): Secondary | ICD-10-CM | POA: Diagnosis not present

## 2018-07-15 DIAGNOSIS — E559 Vitamin D deficiency, unspecified: Secondary | ICD-10-CM | POA: Diagnosis not present

## 2018-09-09 DIAGNOSIS — Z23 Encounter for immunization: Secondary | ICD-10-CM | POA: Diagnosis not present

## 2018-10-09 ENCOUNTER — Emergency Department (HOSPITAL_COMMUNITY): Payer: Medicare Other

## 2018-10-09 ENCOUNTER — Other Ambulatory Visit: Payer: Self-pay

## 2018-10-09 ENCOUNTER — Emergency Department (HOSPITAL_COMMUNITY)
Admission: EM | Admit: 2018-10-09 | Discharge: 2018-10-09 | Disposition: A | Discharge status: Home / Self Care | Payer: Medicare Other | Attending: Emergency Medicine | Admitting: Emergency Medicine

## 2018-10-09 ENCOUNTER — Encounter (HOSPITAL_COMMUNITY): Payer: Self-pay

## 2018-10-09 DIAGNOSIS — Z96651 Presence of right artificial knee joint: Secondary | ICD-10-CM | POA: Insufficient documentation

## 2018-10-09 DIAGNOSIS — I129 Hypertensive chronic kidney disease with stage 1 through stage 4 chronic kidney disease, or unspecified chronic kidney disease: Secondary | ICD-10-CM | POA: Insufficient documentation

## 2018-10-09 DIAGNOSIS — E039 Hypothyroidism, unspecified: Secondary | ICD-10-CM | POA: Diagnosis not present

## 2018-10-09 DIAGNOSIS — Z79899 Other long term (current) drug therapy: Secondary | ICD-10-CM | POA: Insufficient documentation

## 2018-10-09 DIAGNOSIS — Z87891 Personal history of nicotine dependence: Secondary | ICD-10-CM | POA: Insufficient documentation

## 2018-10-09 DIAGNOSIS — B9789 Other viral agents as the cause of diseases classified elsewhere: Secondary | ICD-10-CM | POA: Insufficient documentation

## 2018-10-09 DIAGNOSIS — J069 Acute upper respiratory infection, unspecified: Secondary | ICD-10-CM | POA: Diagnosis not present

## 2018-10-09 DIAGNOSIS — Z85118 Personal history of other malignant neoplasm of bronchus and lung: Secondary | ICD-10-CM | POA: Insufficient documentation

## 2018-10-09 DIAGNOSIS — N184 Chronic kidney disease, stage 4 (severe): Secondary | ICD-10-CM | POA: Insufficient documentation

## 2018-10-09 DIAGNOSIS — R05 Cough: Secondary | ICD-10-CM | POA: Diagnosis not present

## 2018-10-09 LAB — BASIC METABOLIC PANEL
Anion gap: 6 (ref 5–15)
BUN: 20 mg/dL (ref 8–23)
CALCIUM: 9.6 mg/dL (ref 8.9–10.3)
CO2: 29 mmol/L (ref 22–32)
Chloride: 108 mmol/L (ref 98–111)
Creatinine, Ser: 1.41 mg/dL — ABNORMAL HIGH (ref 0.44–1.00)
GFR calc non Af Amer: 32 mL/min — ABNORMAL LOW (ref >60)
GFR, EST AFRICAN AMERICAN: 38 mL/min — AB (ref >60)
GLUCOSE: 103 mg/dL — AB (ref 70–99)
Potassium: 4.5 mmol/L (ref 3.5–5.1)
Sodium: 143 mmol/L (ref 135–145)

## 2018-10-09 LAB — CBC WITH DIFFERENTIAL/PLATELET
Abs Immature Granulocytes: 0.03 10*3/uL (ref 0.00–0.07)
Basophils Absolute: 0 10*3/uL (ref 0.0–0.1)
Basophils Relative: 0 %
EOS ABS: 0.1 10*3/uL (ref 0.0–0.5)
EOS PCT: 2 %
HEMATOCRIT: 45.9 % (ref 36.0–46.0)
HEMOGLOBIN: 14.4 g/dL (ref 12.0–15.0)
Immature Granulocytes: 0 %
LYMPHS ABS: 1.6 10*3/uL (ref 0.7–4.0)
Lymphocytes Relative: 17 %
MCH: 32.1 pg (ref 26.0–34.0)
MCHC: 31.4 g/dL (ref 30.0–36.0)
MCV: 102.5 fL — ABNORMAL HIGH (ref 80.0–100.0)
MONOS PCT: 11 %
Monocytes Absolute: 1 10*3/uL (ref 0.1–1.0)
Neutro Abs: 6.7 10*3/uL (ref 1.7–7.7)
Neutrophils Relative %: 70 %
Platelets: 228 10*3/uL (ref 150–400)
RBC: 4.48 MIL/uL (ref 3.87–5.11)
RDW: 12.1 % (ref 11.5–15.5)
WBC: 9.6 10*3/uL (ref 4.0–10.5)
nRBC: 0 % (ref 0.0–0.2)

## 2018-10-09 MED ORDER — AMOXICILLIN-POT CLAVULANATE 875-125 MG PO TABS: 1.0000 | ORAL_TABLET | Freq: Two times a day (BID) | ORAL | 0 refills | Status: DC

## 2018-10-09 MED ORDER — AMOXICILLIN-POT CLAVULANATE 875-125 MG PO TABS
1.0000 | ORAL_TABLET | Freq: Once | ORAL | Status: AC
  Administered 2018-10-09: 1 via ORAL
  Filled 2018-10-09: qty 1

## 2018-10-09 MED ORDER — ALBUTEROL SULFATE (2.5 MG/3ML) 0.083% IN NEBU
5.0000 mg | INHALATION_SOLUTION | Freq: Once | RESPIRATORY_TRACT | Status: AC
  Administered 2018-10-09: 5 mg via RESPIRATORY_TRACT
  Filled 2018-10-09: qty 6

## 2018-10-09 MED ORDER — IPRATROPIUM BROMIDE 0.02 % IN SOLN
0.5000 mg | Freq: Once | RESPIRATORY_TRACT | Status: AC
  Administered 2018-10-09: 0.5 mg via RESPIRATORY_TRACT
  Filled 2018-10-09: qty 2.5

## 2018-10-09 NOTE — ED Triage Notes (Signed)
Patient c/o a productive cough with clear and white thick sputum x 3 days.

## 2018-10-09 NOTE — ED Provider Notes (Signed)
[facility] DEPT Provider Note   CSN: [id number] Arrival date & time: 10/09/18  1343     History   Chief Complaint Chief Complaint  Patient presents with  . Cough    HPI Valerie Young is a 84 y.o. female who presents with a cough. PMH significant for COPD, recurrent non-small cell lung cancer s/p right upper lobe lobectomy and radiation of the left lung, known pulmonary nodules, hx of pneumothorax, hx of PE, CKD, HTN. The patient states that she started coughing on Tuesday. She coughed all night and she thinks she is developing pneumonia. She has had pneumonia in the pas and it is somewhat similar. She has not had a fever or chest pain. She's had a runny nose and a dry throat. She's been coughing up mucous. She feels SOB with exertion. She lives at home alone. She was going to come to the hospital sooner but felt better yesterday and didn't come. Since her symptoms were ongoing today she thought she should get checked. She has had a flu and pneumonia vaccinations. She has not been using her inhalers.   Pulmonologist: Dr. Lamonte Sakai  HPI  Past Medical History:  Diagnosis Date  . 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 Cancer ([place])    right lung ca - 30 years ago / left lung - 11/2012  . Cancer ([place])    Left lung  . CKD (chronic kidney disease)   . COPD (chronic obstructive pulmonary disease) ([place])   . DJD (degenerative joint disease)    knees  . H/O: lung cancer 1982   Right; treated with resection  . Hypertension   . Hypothyroidism   . PND (post-nasal drip)    Chronic    Patient Active Problem List   Diagnosis Date Noted  . Allergic rhinitis 10/21/2017  . Pneumothorax on left 04/09/2017  . Pleural effusion 04/09/2017  . Pneumothorax 04/09/2017  . Occlusion of left pulmonary artery ([place]) 04/09/2017  . Neoplasm   . Essential hypertension   . Adenocarcinoma, lung ([place])   . Lung cancer ([place]) 10/02/2015  . Hypothyroidism  10/02/2015  . Arthritis of knee 01/06/2014  . Essential hypertension, benign 01/06/2014  . Encounter for therapeutic drug monitoring 12/17/2013  . Pulmonary emboli ([place]) 11/23/2013  . DVT (deep venous thrombosis) ([place]) 11/23/2013  . COPD (chronic obstructive pulmonary disease) with emphysema ([place]) 11/22/2013  . Acute respiratory failure with hypoxia ([place]) 11/22/2013  . Leukocytosis, unspecified 11/22/2013  . Anemia of chronic disease 11/22/2013  . CAP (community acquired pneumonia) 11/22/2013  . Chronic kidney disease (CKD), stage IV (severe) ([place]) 11/15/2013  . Adenocarcinoma of lung ([place]) 12/11/2012  . Pulmonary nodule 12/03/2012  . HYPOTHYROIDISM 06/19/2007  . HYPERTENSION 06/19/2007    Past Surgical History:  Procedure Laterality Date  . ABDOMINAL HYSTERECTOMY     PARTIAL  . CATARACT EXTRACTION, BILATERAL    . JOINT REPLACEMENT     RIGHT TOTAL KNEE  . RADIATION OF LEFT LUNG    . right lung cancer treated with resection  1982  . right thigh postoperative hematoma  02-2008  . RIGHT UPPER LOBE REMOVED    . THYROID SURGERY    . THYROIDECTOMY    . TOTAL KNEE ARTHROPLASTY     Right  . VESICOVAGINAL FISTULA CLOSURE W/ TAH    . VIDEO BRONCHOSCOPY  12/08/2012   Procedure: VIDEO BRONCHOSCOPY WITH FLUORO;  Surgeon: Kathee Delton, MD;  Location: [facility] ENDOSCOPY;  Service: Cardiopulmonary;  Laterality: Bilateral;  OB History    Gravida  0   Para  0   Term  0   Preterm  0   AB  0   Living        SAB  0   TAB  0   Ectopic  0   Multiple      Live Births               Home Medications    Prior to Admission medications   Medication Sig Start Date End Date Taking? Authorizing Provider  acetaminophen (TYLENOL) 325 MG tablet Take 650 mg by mouth 3 (three) times daily. TWO TO THREE TIMES A DAY   Yes [provider]  albuterol (PROVENTIL HFA;VENTOLIN HFA) 108 (90 BASE) MCG/ACT inhaler Inhale 2 puffs into the lungs every 6 (six) hours as needed for  wheezing or shortness of breath. 11/01/15  Yes Collene Gobble, MD  amLODipine (NORVASC) 5 MG tablet TAKE 1 TABLET (5 MG TOTAL) BY MOUTH AT BEDTIME. Patient taking differently: Take 5 mg by mouth daily.    Yes Jegede, Olugbemiga E, MD  atenolol (TENORMIN) 50 MG tablet Take 1 tablet (50 mg total) by mouth daily. 01/06/14  Yes Tresa Garter, MD  cetirizine (ZYRTEC) 10 MG tablet TAKE 1 TABLET (10 MG TOTAL) BY MOUTH AT BEDTIME. Patient taking differently: Take 10 mg by mouth daily.  11/06/15  Yes Collene Gobble, MD  Cholecalciferol (VITAMIN D-3) 1000 units CAPS Take 3,000 Units by mouth daily.   Yes [provider]  levothyroxine (SYNTHROID, LEVOTHROID) 50 MCG tablet Take 50 mcg by mouth daily before breakfast.  09/12/15  Yes [provider]  polyvinyl alcohol (LIQUIFILM TEARS) 1.4 % ophthalmic solution Place 1 drop into both eyes as needed for dry eyes.   Yes [provider]  pravastatin (PRAVACHOL) 40 MG tablet Take 40 mg by mouth daily. 07/16/15  Yes [provider]  guaiFENesin (MUCINEX) 600 MG 12 hr tablet Take 2 tablets (1,200 mg total) by mouth 2 (two) times daily. Patient not taking: Reported on 10/09/2018 10/06/15   Hosie Poisson, MD    Family History Family History  Problem Relation Age of Onset  . CVA Mother 75       hemorrhagic     Social History Social History   Tobacco Use  . Smoking status: Former Smoker    Packs/day: 1.00    Years: 35.00    Pack years: 35.00    Types: Cigarettes    Last attempt to quit: 11/25/1980    Years since quitting: 37.8  . Smokeless tobacco: Never Used  Substance Use Topics  . Alcohol use: No  . Drug use: No     Allergies   Sulfamethoxazole-trimethoprim   Review of Systems Review of Systems  Constitutional: Negative for fever.  HENT: Positive for rhinorrhea. Negative for sore throat.   Respiratory: Positive for cough and shortness of breath. Negative for wheezing.   Cardiovascular: Negative for  chest pain.  Gastrointestinal: Negative for abdominal pain.  Neurological: Negative for syncope and headaches.  All other systems reviewed and are negative.    Physical Exam Updated Vital Signs BP (!) 168/77 (BP Location: Left Arm)   Pulse 91   Temp 98.4 F (36.9 C) (Oral)   Resp 14   Ht 5\' 4"  (1.626 m)   Wt 54.4 kg   SpO2 93%   BMI 20.60 kg/m   Physical Exam  Constitutional: She is oriented to person, place, and time.  She appears well-developed and well-nourished. No distress.  Calm and cooperative. NAD. Frequent wet sounding cough  HENT:  Head: Normocephalic and atraumatic.  Right Ear: Hearing, tympanic membrane, external ear and ear canal normal.  Left Ear: Hearing, tympanic membrane, external ear and ear canal normal.  Nose: Rhinorrhea present. Epistaxis (dried blood in right nare) is observed.  Mouth/Throat: Uvula is midline, oropharynx is clear and moist and mucous membranes are normal.  Eyes: Pupils are equal, round, and reactive to light. Conjunctivae are normal. Right eye exhibits no discharge. Left eye exhibits no discharge. No scleral icterus.  Neck: Normal range of motion.  Cardiovascular: Normal rate and regular rhythm.  Pulmonary/Chest: Effort normal and breath sounds normal. No respiratory distress.  Abdominal: Soft. Bowel sounds are normal. She exhibits no distension. There is no tenderness.  Musculoskeletal:  No peripheral edema  Neurological: She is alert and oriented to person, place, and time.  Skin: Skin is warm and dry.  Psychiatric: She has a normal mood and affect. Her behavior is normal.  Nursing note and vitals reviewed.    ED Treatments / Results  Labs (all labs ordered are listed, but only abnormal results are displayed) Labs Reviewed  BASIC METABOLIC PANEL - Abnormal; Notable for the following components:      Result Value   Glucose, Bld 103 (*)    Creatinine, Ser 1.41 (*)    GFR calc non Af Amer 32 (*)    GFR calc Af Amer 38 (*)     All other components within normal limits  CBC WITH DIFFERENTIAL/PLATELET  CBC WITH DIFFERENTIAL/PLATELET    EKG None  Radiology Dg Chest 2 View  Result Date: 10/09/2018 CLINICAL DATA:  84 year old female with productive cough. History of treated left lung cancer. EXAM: CHEST - 2 VIEW COMPARISON:  Chest CT 04/29/2018 and earlier. FINDINGS: Left upper lung post radiation changes. Postoperative changes to the right hilum and chronic staple line in the peripheral right lung. Stable lung volumes and mediastinal contours. Tracheal air column remains within normal limits. Opacification of the left apex is stable from the June CT. No superimposed pneumothorax, pulmonary edema, pleural effusion or acute pulmonary opacity identified. No acute osseous abnormality identified. Abdominal Calcified aortic atherosclerosis. Negative visible bowel gas pattern. IMPRESSION: Stable post treatment appearance of the chest since June. No superimposed acute findings are identified. Electronically Signed   By: Genevie Ann M.D.   On: 10/09/2018 14:21    Procedures Procedures (including critical care time)  Medications Ordered in ED Medications  albuterol (PROVENTIL) (2.5 MG/3ML) 0.083% nebulizer solution 5 mg (has no administration in time range)  ipratropium (ATROVENT) nebulizer solution 0.5 mg (has no administration in time range)  amoxicillin-clavulanate (AUGMENTIN) 875-125 MG per tablet 1 tablet (1 tablet Oral Given 10/09/18 1634)     Initial Impression / Assessment and Plan / ED Course  I have reviewed the triage vital signs and the nursing notes.  Pertinent labs & imaging results that were available during my care of the patient were reviewed by me and considered in my medical decision making (see chart for details).  84 year old female presents with URI symptoms and cough for the past three days. She is hypertensive but otherwise vitals are normal. Exam is consistent with URI vs bronchitis. CXR does not show  pneumonia. She was ambulated in the hall and sats remained 93%. Shared visit with Dr. Venora Maples. Will give dose of Augmentin and breathing tx here. Will give her a course of antibiotics due to  her chronic medical conditions and have her use inhalers at home. Patient and her son at bedside are comfortable with plan.   Labs took an abnormally long time to result. They appear like they are at her baseline. Strict return precautions given.  Final Clinical Impressions(s) / ED Diagnoses   Final diagnoses:  Viral URI with cough    ED Discharge Orders    None       Recardo Evangelist, PA-C 10/09/18 Ainsley Spinner, MD 10/12/18 [phone]

## 2018-10-09 NOTE — Discharge Instructions (Signed)
Please continue to use inhalers at home Take Augmentin twice a day for one week Please follow up with your doctor Return if you are worsening

## 2018-10-15 ENCOUNTER — Other Ambulatory Visit: Payer: Self-pay | Admitting: Emergency Medicine

## 2018-10-15 ENCOUNTER — Telehealth: Payer: Self-pay | Admitting: Emergency Medicine

## 2018-10-15 DIAGNOSIS — R911 Solitary pulmonary nodule: Secondary | ICD-10-CM

## 2018-10-15 MED ORDER — ALBUTEROL SULFATE HFA 108 (90 BASE) MCG/ACT IN AERS: 2.0000 | INHALATION_SPRAY | Freq: Four times a day (QID) | RESPIRATORY_TRACT | 5 refills | Status: DC | PRN

## 2018-10-15 NOTE — Telephone Encounter (Signed)
Called and spoke with the patient and verified the need and made sure it was okay to refill. I have placed the refill at this time nothing further needed at this time.

## 2018-10-29 ENCOUNTER — Ambulatory Visit (HOSPITAL_COMMUNITY)
Admission: RE | Admit: 2018-10-29 | Discharge: 2018-10-29 | Disposition: A | Discharge status: Home / Self Care | Payer: Medicare Other | Source: Ambulatory Visit | Attending: Emergency Medicine | Admitting: Emergency Medicine

## 2018-10-29 DIAGNOSIS — R911 Solitary pulmonary nodule: Secondary | ICD-10-CM | POA: Diagnosis not present

## 2018-10-29 DIAGNOSIS — R918 Other nonspecific abnormal finding of lung field: Secondary | ICD-10-CM | POA: Diagnosis not present

## 2018-10-30 ENCOUNTER — Other Ambulatory Visit: Payer: Medicare Other

## 2018-10-30 ENCOUNTER — Ambulatory Visit (HOSPITAL_COMMUNITY): Payer: Medicare Other

## 2018-11-09 ENCOUNTER — Telehealth: Payer: Self-pay | Admitting: Emergency Medicine

## 2018-11-10 NOTE — Telephone Encounter (Signed)
Error

## 2018-11-12 ENCOUNTER — Encounter: Payer: Self-pay | Admitting: Emergency Medicine

## 2018-11-12 ENCOUNTER — Ambulatory Visit (INDEPENDENT_AMBULATORY_CARE_PROVIDER_SITE_OTHER): Payer: Medicare Other | Admitting: Emergency Medicine

## 2018-11-12 DIAGNOSIS — C3492 Malignant neoplasm of unspecified part of left bronchus or lung: Secondary | ICD-10-CM

## 2018-11-12 NOTE — Patient Instructions (Signed)
We discussed her CT scan of the chest today.  There are two pulmonary nodules, one at the bottom of each lung, that we are following.  These are slightly larger compared with 6 months ago.  It possible that these could represent a recurrence of lung cancer.  We discussed the pros and cons of pushing for a biopsy of 1 or both of these areas versus continuing to follow.  Based on your absence of symptoms and your philosophy for your care we decided to hold off for now.  We will repeat a CT scan of your chest without contrast in 6 months to see if the nodules are changing. Follow with Dr Lamonte Sakai in 6 months or sooner if you have any problems

## 2018-11-12 NOTE — Assessment & Plan Note (Signed)
She has had recurrent disease, has been treated most recently with left upper lobe SBRT and this area is now scarred.  A left lower lobe cavitary lesion is suspicious but unchanged.  She has bilateral central basilar nodules that are slightly larger.  Certainly she is at risk for recurrent lung cancer and these may represent malignancy.  We discussed in detail the pros and cons of a biopsy to facilitate therapy.  She understands that at her age there may be diminishing returns from therapy.  At this point she would like to hold off on biopsy, continue to follow.  I think that if she ever became symptomatic she might be willing to accept palliative therapy.  I supported her decision.  We will repeat her CT scan in 6 months.

## 2018-11-12 NOTE — Progress Notes (Signed)
   Subjective:    Patient ID: Valerie Young, female    DOB: 1929/03/20, 84 y.o.   MRN: [id number]  HPI  ROV 05/18/18 --Valerie Young has a history of COPD, recurrent non-small cell lung cancer with a history of a right upper lobe lobectomy and more recently left upper lobe SBRT.  She has subsequently had CT scans and PET scans that have shown hypermetabolic left lower lobe cavitary lesion.  Repeat CT was done on 04/29/2018 which I have reviewed.  This showed stable left paramediastinal radiation changes and a stable thin-walled cavitary lesion in the posterior left midlung.  A new 8 x 6 mm left lower lobe pulmonary nodule is present.  There is a stable right lower lobe groundglass opacity measuring 1.4 x 0.8 cm. She is doing well, has good energy. Minimal exertional SOB. She never uses albuterol.   ROV 11/12/18 --84 year old woman that is followed for COPD and recurrent non-small cell lung cancer.  She had a right upper lobectomy in the past and more recently SBRT to the left upper lobe.  Most recently we have been following a left lower lobe cavitary lesion that is PET positive, a new left lower lobe nodule, stable right lower lobe groundglass opacity.  She underwent a repeat CT on 10/29/2018 which I have reviewed.  This shows an interval increase in size in both the left basilar nodule (0.9 x 1.2 cm) and the right lower lobe ground glass opacity (2.4 x 1.0 cm).  The left lower lobe cavitary lesion is stable in size.  She states that she is doing well, not dyspneic unless she bends over.   We discussed today whether she would want rx if we were to establish a dx of lung CA > she is unsure about this.    Review of Systems As per HPI     Objective:   Physical Exam Vitals:   11/12/18 1228  BP: (!) 148/70  Pulse: (!) 59  SpO2: 96%  Weight: 123 lb 9.6 oz (56.1 kg)  Height: 5\' 4"  (1.626 m)   Gen: Pleasant, thin elderly, in no distress,  normal affect  ENT: No lesions,  mouth clear,  oropharynx  clear, no postnasal drip  Neck: No JVD, no stridor  Lungs: No use of accessory muscles, clear without rales or rhonchi  Cardiovascular: RRR, heart sounds normal, no murmur or gallops, no peripheral edema  Musculoskeletal: No deformities, no cyanosis or clubbing  Neuro: alert, non focal  Skin: Warm, no lesions or rashes      Assessment & Plan:  Adenocarcinoma of lung She has had recurrent disease, has been treated most recently with left upper lobe SBRT and this area is now scarred.  A left lower lobe cavitary lesion is suspicious but unchanged.  She has bilateral central basilar nodules that are slightly larger.  Certainly she is at risk for recurrent lung cancer and these may represent malignancy.  We discussed in detail the pros and cons of a biopsy to facilitate therapy.  She understands that at her age there may be diminishing returns from therapy.  At this point she would like to hold off on biopsy, continue to follow.  I think that if she ever became symptomatic she might be willing to accept palliative therapy.  I supported her decision.  We will repeat her CT scan in 6 months.  Baltazar Apo, MD, PhD 11/12/2018, 12:48 PM [facility] [phone] or if no answer [phone]

## 2018-11-12 NOTE — Addendum Note (Signed)
Addended by: Mathis Bud on: 11/12/2018 12:54 PM   Modules accepted: Orders

## 2018-12-18 DIAGNOSIS — G5791 Unspecified mononeuropathy of right lower limb: Secondary | ICD-10-CM | POA: Diagnosis not present

## 2019-01-20 DIAGNOSIS — Z1389 Encounter for screening for other disorder: Secondary | ICD-10-CM | POA: Diagnosis not present

## 2019-01-20 DIAGNOSIS — E559 Vitamin D deficiency, unspecified: Secondary | ICD-10-CM | POA: Diagnosis not present

## 2019-01-20 DIAGNOSIS — J310 Chronic rhinitis: Secondary | ICD-10-CM | POA: Diagnosis not present

## 2019-01-20 DIAGNOSIS — E782 Mixed hyperlipidemia: Secondary | ICD-10-CM | POA: Diagnosis not present

## 2019-01-20 DIAGNOSIS — N183 Chronic kidney disease, stage 3 (moderate): Secondary | ICD-10-CM | POA: Diagnosis not present

## 2019-01-20 DIAGNOSIS — I10 Essential (primary) hypertension: Secondary | ICD-10-CM | POA: Diagnosis not present

## 2019-01-20 DIAGNOSIS — E2839 Other primary ovarian failure: Secondary | ICD-10-CM | POA: Diagnosis not present

## 2019-01-20 DIAGNOSIS — E039 Hypothyroidism, unspecified: Secondary | ICD-10-CM | POA: Diagnosis not present

## 2019-01-20 DIAGNOSIS — G5791 Unspecified mononeuropathy of right lower limb: Secondary | ICD-10-CM | POA: Diagnosis not present

## 2019-01-20 DIAGNOSIS — Z0000 Encounter for general adult medical examination without abnormal findings: Secondary | ICD-10-CM | POA: Diagnosis not present

## 2019-01-21 ENCOUNTER — Other Ambulatory Visit: Payer: Self-pay | Admitting: Family Medicine

## 2019-01-21 DIAGNOSIS — E2839 Other primary ovarian failure: Secondary | ICD-10-CM

## 2019-04-27 ENCOUNTER — Telehealth: Payer: Self-pay | Admitting: *Deleted

## 2019-04-27 NOTE — Telephone Encounter (Signed)
Called to complete screening for CVOID 19 Pt does not have voicemail. Will call back.

## 2019-04-28 ENCOUNTER — Inpatient Hospital Stay: Admission: RE | Admit: 2019-04-28 | Payer: Medicare Other | Source: Ambulatory Visit

## 2019-04-30 ENCOUNTER — Ambulatory Visit: Payer: Medicare Other | Admitting: Adult Health

## 2019-08-27 DIAGNOSIS — Z23 Encounter for immunization: Secondary | ICD-10-CM | POA: Diagnosis not present

## 2019-09-22 DIAGNOSIS — J310 Chronic rhinitis: Secondary | ICD-10-CM | POA: Diagnosis not present

## 2019-09-22 DIAGNOSIS — E782 Mixed hyperlipidemia: Secondary | ICD-10-CM | POA: Diagnosis not present

## 2019-09-22 DIAGNOSIS — G5791 Unspecified mononeuropathy of right lower limb: Secondary | ICD-10-CM | POA: Diagnosis not present

## 2019-09-22 DIAGNOSIS — E2839 Other primary ovarian failure: Secondary | ICD-10-CM | POA: Diagnosis not present

## 2019-09-22 DIAGNOSIS — N1832 Chronic kidney disease, stage 3b: Secondary | ICD-10-CM | POA: Diagnosis not present

## 2019-09-22 DIAGNOSIS — I10 Essential (primary) hypertension: Secondary | ICD-10-CM | POA: Diagnosis not present

## 2019-09-22 DIAGNOSIS — E559 Vitamin D deficiency, unspecified: Secondary | ICD-10-CM | POA: Diagnosis not present

## 2019-09-22 DIAGNOSIS — E039 Hypothyroidism, unspecified: Secondary | ICD-10-CM | POA: Diagnosis not present

## 2019-12-17 ENCOUNTER — Other Ambulatory Visit: Payer: Medicare Other

## 2020-01-13 DIAGNOSIS — C3490 Malignant neoplasm of unspecified part of unspecified bronchus or lung: Secondary | ICD-10-CM | POA: Diagnosis not present

## 2020-01-13 DIAGNOSIS — N183 Chronic kidney disease, stage 3 unspecified: Secondary | ICD-10-CM | POA: Diagnosis not present

## 2020-01-13 DIAGNOSIS — I10 Essential (primary) hypertension: Secondary | ICD-10-CM | POA: Diagnosis not present

## 2020-01-13 DIAGNOSIS — M153 Secondary multiple arthritis: Secondary | ICD-10-CM | POA: Diagnosis not present

## 2020-01-13 DIAGNOSIS — J449 Chronic obstructive pulmonary disease, unspecified: Secondary | ICD-10-CM | POA: Diagnosis not present

## 2020-01-13 DIAGNOSIS — E782 Mixed hyperlipidemia: Secondary | ICD-10-CM | POA: Diagnosis not present

## 2020-01-13 DIAGNOSIS — M179 Osteoarthritis of knee, unspecified: Secondary | ICD-10-CM | POA: Diagnosis not present

## 2020-01-13 DIAGNOSIS — E039 Hypothyroidism, unspecified: Secondary | ICD-10-CM | POA: Diagnosis not present

## 2020-04-04 DIAGNOSIS — C3490 Malignant neoplasm of unspecified part of unspecified bronchus or lung: Secondary | ICD-10-CM | POA: Diagnosis not present

## 2020-04-04 DIAGNOSIS — E039 Hypothyroidism, unspecified: Secondary | ICD-10-CM | POA: Diagnosis not present

## 2020-04-04 DIAGNOSIS — M179 Osteoarthritis of knee, unspecified: Secondary | ICD-10-CM | POA: Diagnosis not present

## 2020-04-04 DIAGNOSIS — I10 Essential (primary) hypertension: Secondary | ICD-10-CM | POA: Diagnosis not present

## 2020-04-04 DIAGNOSIS — N183 Chronic kidney disease, stage 3 unspecified: Secondary | ICD-10-CM | POA: Diagnosis not present

## 2020-04-04 DIAGNOSIS — E782 Mixed hyperlipidemia: Secondary | ICD-10-CM | POA: Diagnosis not present

## 2020-04-04 DIAGNOSIS — J449 Chronic obstructive pulmonary disease, unspecified: Secondary | ICD-10-CM | POA: Diagnosis not present

## 2020-04-04 DIAGNOSIS — M153 Secondary multiple arthritis: Secondary | ICD-10-CM | POA: Diagnosis not present

## 2020-04-07 DIAGNOSIS — L989 Disorder of the skin and subcutaneous tissue, unspecified: Secondary | ICD-10-CM | POA: Diagnosis not present

## 2020-04-12 DIAGNOSIS — C44329 Squamous cell carcinoma of skin of other parts of face: Secondary | ICD-10-CM | POA: Diagnosis not present

## 2020-04-12 DIAGNOSIS — D485 Neoplasm of uncertain behavior of skin: Secondary | ICD-10-CM | POA: Diagnosis not present

## 2020-05-03 DIAGNOSIS — C44329 Squamous cell carcinoma of skin of other parts of face: Secondary | ICD-10-CM | POA: Diagnosis not present

## 2020-05-03 DIAGNOSIS — Z85828 Personal history of other malignant neoplasm of skin: Secondary | ICD-10-CM | POA: Diagnosis not present

## 2020-06-06 ENCOUNTER — Inpatient Hospital Stay (HOSPITAL_COMMUNITY)
Admission: EM | Admit: 2020-06-06 | Discharge: 2020-06-13 | DRG: 040 | Disposition: A | Discharge status: Skilled Nursing Facility | Payer: Medicare Other | Attending: Internal Medicine | Admitting: Internal Medicine

## 2020-06-06 ENCOUNTER — Emergency Department (HOSPITAL_COMMUNITY): Payer: Medicare Other

## 2020-06-06 ENCOUNTER — Encounter (HOSPITAL_COMMUNITY): Payer: Self-pay | Admitting: *Deleted

## 2020-06-06 ENCOUNTER — Other Ambulatory Visit: Payer: Self-pay

## 2020-06-06 DIAGNOSIS — K5730 Diverticulosis of large intestine without perforation or abscess without bleeding: Secondary | ICD-10-CM | POA: Diagnosis present

## 2020-06-06 DIAGNOSIS — N184 Chronic kidney disease, stage 4 (severe): Secondary | ICD-10-CM | POA: Diagnosis present

## 2020-06-06 DIAGNOSIS — D7589 Other specified diseases of blood and blood-forming organs: Secondary | ICD-10-CM

## 2020-06-06 DIAGNOSIS — H532 Diplopia: Secondary | ICD-10-CM | POA: Diagnosis present

## 2020-06-06 DIAGNOSIS — R404 Transient alteration of awareness: Secondary | ICD-10-CM | POA: Diagnosis not present

## 2020-06-06 DIAGNOSIS — E785 Hyperlipidemia, unspecified: Secondary | ICD-10-CM | POA: Diagnosis present

## 2020-06-06 DIAGNOSIS — I4430 Unspecified atrioventricular block: Secondary | ICD-10-CM | POA: Diagnosis not present

## 2020-06-06 DIAGNOSIS — R4182 Altered mental status, unspecified: Secondary | ICD-10-CM | POA: Diagnosis not present

## 2020-06-06 DIAGNOSIS — Z902 Acquired absence of lung [part of]: Secondary | ICD-10-CM

## 2020-06-06 DIAGNOSIS — Z923 Personal history of irradiation: Secondary | ICD-10-CM

## 2020-06-06 DIAGNOSIS — R946 Abnormal results of thyroid function studies: Secondary | ICD-10-CM | POA: Diagnosis not present

## 2020-06-06 DIAGNOSIS — Z96651 Presence of right artificial knee joint: Secondary | ICD-10-CM | POA: Diagnosis present

## 2020-06-06 DIAGNOSIS — R29702 NIHSS score 2: Secondary | ICD-10-CM | POA: Diagnosis present

## 2020-06-06 DIAGNOSIS — H5500 Unspecified nystagmus: Secondary | ICD-10-CM | POA: Diagnosis present

## 2020-06-06 DIAGNOSIS — K529 Noninfective gastroenteritis and colitis, unspecified: Secondary | ICD-10-CM | POA: Diagnosis present

## 2020-06-06 DIAGNOSIS — G9341 Metabolic encephalopathy: Secondary | ICD-10-CM | POA: Diagnosis present

## 2020-06-06 DIAGNOSIS — I10 Essential (primary) hypertension: Secondary | ICD-10-CM | POA: Diagnosis not present

## 2020-06-06 DIAGNOSIS — I63432 Cerebral infarction due to embolism of left posterior cerebral artery: Secondary | ICD-10-CM | POA: Diagnosis not present

## 2020-06-06 DIAGNOSIS — Z20822 Contact with and (suspected) exposure to covid-19: Secondary | ICD-10-CM | POA: Diagnosis present

## 2020-06-06 DIAGNOSIS — E890 Postprocedural hypothyroidism: Secondary | ICD-10-CM | POA: Diagnosis present

## 2020-06-06 DIAGNOSIS — R7989 Other specified abnormal findings of blood chemistry: Secondary | ICD-10-CM

## 2020-06-06 DIAGNOSIS — R0989 Other specified symptoms and signs involving the circulatory and respiratory systems: Secondary | ICD-10-CM

## 2020-06-06 DIAGNOSIS — I129 Hypertensive chronic kidney disease with stage 1 through stage 4 chronic kidney disease, or unspecified chronic kidney disease: Secondary | ICD-10-CM | POA: Diagnosis present

## 2020-06-06 DIAGNOSIS — J439 Emphysema, unspecified: Secondary | ICD-10-CM | POA: Diagnosis present

## 2020-06-06 DIAGNOSIS — E039 Hypothyroidism, unspecified: Secondary | ICD-10-CM | POA: Diagnosis present

## 2020-06-06 DIAGNOSIS — Z823 Family history of stroke: Secondary | ICD-10-CM

## 2020-06-06 DIAGNOSIS — F05 Delirium due to known physiological condition: Secondary | ICD-10-CM | POA: Diagnosis not present

## 2020-06-06 DIAGNOSIS — R112 Nausea with vomiting, unspecified: Secondary | ICD-10-CM

## 2020-06-06 DIAGNOSIS — Z87891 Personal history of nicotine dependence: Secondary | ICD-10-CM

## 2020-06-06 DIAGNOSIS — Z85118 Personal history of other malignant neoplasm of bronchus and lung: Secondary | ICD-10-CM

## 2020-06-06 DIAGNOSIS — I440 Atrioventricular block, first degree: Secondary | ICD-10-CM | POA: Diagnosis not present

## 2020-06-06 DIAGNOSIS — Z86718 Personal history of other venous thrombosis and embolism: Secondary | ICD-10-CM

## 2020-06-06 DIAGNOSIS — Z79890 Hormone replacement therapy (postmenopausal): Secondary | ICD-10-CM

## 2020-06-06 DIAGNOSIS — Z79899 Other long term (current) drug therapy: Secondary | ICD-10-CM

## 2020-06-06 DIAGNOSIS — N390 Urinary tract infection, site not specified: Secondary | ICD-10-CM | POA: Diagnosis present

## 2020-06-06 DIAGNOSIS — I6340 Cerebral infarction due to embolism of unspecified cerebral artery: Secondary | ICD-10-CM | POA: Insufficient documentation

## 2020-06-06 DIAGNOSIS — E872 Acidosis: Secondary | ICD-10-CM | POA: Diagnosis not present

## 2020-06-06 DIAGNOSIS — Z86711 Personal history of pulmonary embolism: Secondary | ICD-10-CM

## 2020-06-06 DIAGNOSIS — B961 Klebsiella pneumoniae [K. pneumoniae] as the cause of diseases classified elsewhere: Secondary | ICD-10-CM | POA: Diagnosis present

## 2020-06-06 DIAGNOSIS — I63443 Cerebral infarction due to embolism of bilateral cerebellar arteries: Secondary | ICD-10-CM

## 2020-06-06 DIAGNOSIS — J80 Acute respiratory distress syndrome: Secondary | ICD-10-CM | POA: Diagnosis not present

## 2020-06-06 DIAGNOSIS — H5340 Unspecified visual field defects: Secondary | ICD-10-CM | POA: Diagnosis present

## 2020-06-06 DIAGNOSIS — C3490 Malignant neoplasm of unspecified part of unspecified bronchus or lung: Secondary | ICD-10-CM | POA: Diagnosis present

## 2020-06-06 DIAGNOSIS — R531 Weakness: Secondary | ICD-10-CM | POA: Diagnosis not present

## 2020-06-06 LAB — COMPREHENSIVE METABOLIC PANEL
ALT: 14 U/L (ref 0–44)
AST: 23 U/L (ref 15–41)
Albumin: 4.2 g/dL (ref 3.5–5.0)
Alkaline Phosphatase: 73 U/L (ref 38–126)
Anion gap: 14 (ref 5–15)
BUN: 34 mg/dL — ABNORMAL HIGH (ref 8–23)
CO2: 21 mmol/L — ABNORMAL LOW (ref 22–32)
Calcium: 9.9 mg/dL (ref 8.9–10.3)
Chloride: 104 mmol/L (ref 98–111)
Creatinine, Ser: 1.29 mg/dL — ABNORMAL HIGH (ref 0.44–1.00)
GFR calc Af Amer: 43 mL/min — ABNORMAL LOW (ref >60)
GFR calc non Af Amer: 37 mL/min — ABNORMAL LOW (ref >60)
Glucose, Bld: 174 mg/dL — ABNORMAL HIGH (ref 70–99)
Potassium: 3.9 mmol/L (ref 3.5–5.1)
Sodium: 139 mmol/L (ref 135–145)
Total Bilirubin: 0.6 mg/dL (ref 0.3–1.2)
Total Protein: 7.8 g/dL (ref 6.5–8.1)

## 2020-06-06 LAB — I-STAT VENOUS BLOOD GAS, ED
Acid-Base Excess: 2 mmol/L (ref 0.0–2.0)
Bicarbonate: 27.4 mmol/L (ref 20.0–28.0)
Calcium, Ion: 1.21 mmol/L (ref 1.15–1.40)
HCT: 42 % (ref 36.0–46.0)
Hemoglobin: 14.3 g/dL (ref 12.0–15.0)
O2 Saturation: 87 %
Potassium: 4 mmol/L (ref 3.5–5.1)
Sodium: 140 mmol/L (ref 135–145)
TCO2: 29 mmol/L (ref 22–32)
pCO2, Ven: 45.1 mmHg (ref 44.0–60.0)
pH, Ven: 7.393 (ref 7.250–7.430)
pO2, Ven: 54 mmHg — ABNORMAL HIGH (ref 32.0–45.0)

## 2020-06-06 LAB — CBC WITH DIFFERENTIAL/PLATELET
Abs Immature Granulocytes: 0.06 10*3/uL (ref 0.00–0.07)
Basophils Absolute: 0.1 10*3/uL (ref 0.0–0.1)
Basophils Relative: 1 %
Eosinophils Absolute: 0.1 10*3/uL (ref 0.0–0.5)
Eosinophils Relative: 0 %
HCT: 42.7 % (ref 36.0–46.0)
Hemoglobin: 13.8 g/dL (ref 12.0–15.0)
Immature Granulocytes: 0 %
Lymphocytes Relative: 15 %
Lymphs Abs: 2.7 10*3/uL (ref 0.7–4.0)
MCH: 32.9 pg (ref 26.0–34.0)
MCHC: 32.3 g/dL (ref 30.0–36.0)
MCV: 101.7 fL — ABNORMAL HIGH (ref 80.0–100.0)
Monocytes Absolute: 1 10*3/uL (ref 0.1–1.0)
Monocytes Relative: 6 %
Neutro Abs: 13.8 10*3/uL — ABNORMAL HIGH (ref 1.7–7.7)
Neutrophils Relative %: 78 %
Platelets: 250 10*3/uL (ref 150–400)
RBC: 4.2 MIL/uL (ref 3.87–5.11)
RDW: 12.3 % (ref 11.5–15.5)
WBC: 17.7 10*3/uL — ABNORMAL HIGH (ref 4.0–10.5)
nRBC: 0 % (ref 0.0–0.2)

## 2020-06-06 LAB — TSH: TSH: 4.041 u[IU]/mL (ref 0.350–4.500)

## 2020-06-06 LAB — ETHANOL: Alcohol, Ethyl (B): <10 mg/dL (ref <10)

## 2020-06-06 LAB — CBG MONITORING, ED: Glucose-Capillary: 173 mg/dL — ABNORMAL HIGH (ref 70–99)

## 2020-06-06 LAB — T4, FREE: Free T4: 1.19 ng/dL — ABNORMAL HIGH (ref 0.61–1.12)

## 2020-06-06 LAB — LACTIC ACID, PLASMA: Lactic Acid, Venous: 1.8 mmol/L (ref 0.5–1.9)

## 2020-06-06 LAB — TROPONIN I (HIGH SENSITIVITY): Troponin I (High Sensitivity): <2 ng/L (ref <18)

## 2020-06-06 LAB — AMMONIA: Ammonia: 17 umol/L (ref 9–35)

## 2020-06-06 MED ORDER — ONDANSETRON 4 MG PO TBDP: 4.0000 mg | ORAL_TABLET | Freq: Once | ORAL | Status: DC

## 2020-06-06 MED ORDER — ONDANSETRON HCL 4 MG/2ML IJ SOLN
4.0000 mg | Freq: Once | INTRAMUSCULAR | Status: AC
  Administered 2020-06-07: 4 mg via INTRAVENOUS
  Filled 2020-06-06: qty 2

## 2020-06-06 MED ORDER — SODIUM CHLORIDE 0.9 % IV BOLUS
1000.0000 mL | Freq: Once | INTRAVENOUS | Status: AC
  Administered 2020-06-07: 1000 mL via INTRAVENOUS

## 2020-06-06 NOTE — ED Provider Notes (Signed)
[facility] EMERGENCY DEPARTMENT Provider Note   CSN: [id number] Arrival date & time: 06/06/20  2156     History Chief Complaint  Patient presents with  . Altered Mental Status    Valerie Young is a 86 y.o. female.  86 yo F with a chief complaints of altered mental status. Patient was found on her porch earlier this evening. Last seen yesterday at 5 PM. Acting differently than normal. Talking less and seemed more withdrawn. Patient having some nausea and vomiting. She is unable to endorse any symptoms. Level 5 caveat altered mental status.  The history is provided by the patient.  Altered Mental Status Presenting symptoms: confusion   Severity:  Moderate Most recent episode:  Yesterday Episode history:  Continuous Duration:  1 day Timing:  Constant Progression:  Unchanged Chronicity:  New Associated symptoms: nausea and vomiting   Associated symptoms: no abdominal pain, no fever, no headaches and no palpitations        Past Medical History:  Diagnosis Date  . 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 Cancer ([place])    right lung ca - 30 years ago / left lung - 11/2012  . Cancer ([place])    Left lung  . CKD (chronic kidney disease)   . COPD (chronic obstructive pulmonary disease) ([place])   . DJD (degenerative joint disease)    knees  . H/O: lung cancer 1982   Right; treated with resection  . Hypertension   . Hypothyroidism   . PND (post-nasal drip)    Chronic    Patient Active Problem List   Diagnosis Date Noted  . Allergic rhinitis 10/21/2017  . Pneumothorax on left 04/09/2017  . Pleural effusion 04/09/2017  . Pneumothorax 04/09/2017  . Occlusion of left pulmonary artery ([place]) 04/09/2017  . Neoplasm   . Essential hypertension   . Adenocarcinoma, lung ([place])   . Lung cancer ([place]) 10/02/2015  . Hypothyroidism 10/02/2015  . Arthritis of knee 01/06/2014  . Essential hypertension, benign 01/06/2014  . Encounter for therapeutic drug  monitoring 12/17/2013  . Pulmonary emboli ([place]) 11/23/2013  . DVT (deep venous thrombosis) ([place]) 11/23/2013  . COPD (chronic obstructive pulmonary disease) with emphysema ([place]) 11/22/2013  . Acute respiratory failure with hypoxia ([place]) 11/22/2013  . Leukocytosis, unspecified 11/22/2013  . Anemia of chronic disease 11/22/2013  . CAP (community acquired pneumonia) 11/22/2013  . Chronic kidney disease (CKD), stage IV (severe) ([place]) 11/15/2013  . Adenocarcinoma of lung ([place]) 12/11/2012  . Pulmonary nodule 12/03/2012  . HYPOTHYROIDISM 06/19/2007  . HYPERTENSION 06/19/2007    Past Surgical History:  Procedure Laterality Date  . ABDOMINAL HYSTERECTOMY     PARTIAL  . CATARACT EXTRACTION, BILATERAL    . JOINT REPLACEMENT     RIGHT TOTAL KNEE  . RADIATION OF LEFT LUNG    . right lung cancer treated with resection  1982  . right thigh postoperative hematoma  02-2008  . RIGHT UPPER LOBE REMOVED    . THYROID SURGERY    . THYROIDECTOMY    . TOTAL KNEE ARTHROPLASTY     Right  . VESICOVAGINAL FISTULA CLOSURE W/ TAH    . VIDEO BRONCHOSCOPY  12/08/2012   Procedure: VIDEO BRONCHOSCOPY WITH FLUORO;  Surgeon: Kathee Delton, MD;  Location: [facility] ENDOSCOPY;  Service: Cardiopulmonary;  Laterality: Bilateral;     OB History    Gravida  0   Para  0   Term  0   Preterm  0   AB  0   Living        SAB  0   TAB  0   Ectopic  0   Multiple      Live Births              Family History  Problem Relation Age of Onset  . CVA Mother 71       hemorrhagic     Social History   Tobacco Use  . Smoking status: Former Smoker    Packs/day: 1.00    Years: 35.00    Pack years: 35.00    Types: Cigarettes    Quit date: 11/25/1980    Years since quitting: 39.5  . Smokeless tobacco: Never Used  Vaping Use  . Vaping Use: Never used  Substance Use Topics  . Alcohol use: No  . Drug use: No    Home Medications Prior to Admission medications   Medication Sig Start Date End Date Taking?  Authorizing Provider  acetaminophen (TYLENOL) 325 MG tablet Take 650 mg by mouth 3 (three) times daily. TWO TO THREE TIMES A DAY    [provider]  albuterol (PROVENTIL HFA;VENTOLIN HFA) 108 (90 Base) MCG/ACT inhaler Inhale 2 puffs into the lungs every 6 (six) hours as needed for wheezing or shortness of breath. 10/15/18   Collene Gobble, MD  amLODipine (NORVASC) 5 MG tablet TAKE 1 TABLET (5 MG TOTAL) BY MOUTH AT BEDTIME. Patient taking differently: Take 5 mg by mouth daily.     Tresa Garter, MD  atenolol (TENORMIN) 50 MG tablet Take 1 tablet (50 mg total) by mouth daily. 01/06/14   Tresa Garter, MD  cetirizine (ZYRTEC) 10 MG tablet TAKE 1 TABLET (10 MG TOTAL) BY MOUTH AT BEDTIME. Patient taking differently: Take 10 mg by mouth daily.  11/06/15   Collene Gobble, MD  Cholecalciferol (VITAMIN D-3) 1000 units CAPS Take 3,000 Units by mouth daily.    [provider]  guaiFENesin (MUCINEX) 600 MG 12 hr tablet Take 2 tablets (1,200 mg total) by mouth 2 (two) times daily. 10/06/15   Hosie Poisson, MD  levothyroxine (SYNTHROID, LEVOTHROID) 50 MCG tablet Take 50 mcg by mouth daily before breakfast.  09/12/15   [provider]  polyvinyl alcohol (LIQUIFILM TEARS) 1.4 % ophthalmic solution Place 1 drop into both eyes as needed for dry eyes.    [provider]  pravastatin (PRAVACHOL) 40 MG tablet Take 40 mg by mouth daily. 07/16/15   [provider]    Allergies    Sulfamethoxazole-trimethoprim  Review of Systems   Review of Systems  Constitutional: Negative for chills and fever.  HENT: Negative for congestion and rhinorrhea.   Eyes: Negative for redness and visual disturbance.  Respiratory: Negative for shortness of breath and wheezing.   Cardiovascular: Negative for chest pain and palpitations.  Gastrointestinal: Positive for nausea and vomiting. Negative for abdominal pain.  Genitourinary: Negative for dysuria and urgency.    Musculoskeletal: Negative for arthralgias and myalgias.  Skin: Negative for pallor and wound.  Neurological: Negative for dizziness and headaches.  Psychiatric/Behavioral: Positive for confusion.    Physical Exam Updated Vital Signs BP (!) 170/63   Pulse 71   Temp (!) 97.2 F (36.2 C) (Axillary)   Resp 20   SpO2 98%   Physical Exam Vitals and nursing note reviewed.  Constitutional:      General: She is not in acute distress.    Appearance: She is well-developed. She is not diaphoretic.  HENT:  Head: Normocephalic and atraumatic.  Eyes:     Pupils: Pupils are equal, round, and reactive to light.  Cardiovascular:     Rate and Rhythm: Normal rate and regular rhythm.     Heart sounds: No murmur heard.  No friction rub. No gallop.   Pulmonary:     Effort: Pulmonary effort is normal.     Breath sounds: No wheezing or rales.  Abdominal:     General: There is no distension.     Palpations: Abdomen is soft.     Tenderness: There is no abdominal tenderness.  Musculoskeletal:        General: No tenderness.     Cervical back: Normal range of motion and neck supple.  Skin:    General: Skin is warm and dry.  Neurological:     Mental Status: She is alert and oriented to person, place, and time.     Comments: Mild LUE weakness compared to R  Psychiatric:        Behavior: Behavior normal.     ED Results / Procedures / Treatments   Labs (all labs ordered are listed, but only abnormal results are displayed) Labs Reviewed  COMPREHENSIVE METABOLIC PANEL - Abnormal; Notable for the following components:      Result Value   CO2 21 (*)    Glucose, Bld 174 (*)    BUN 34 (*)    Creatinine, Ser 1.29 (*)    GFR calc non Af Amer 37 (*)    GFR calc Af Amer 43 (*)    All other components within normal limits  CBC WITH DIFFERENTIAL/PLATELET - Abnormal; Notable for the following components:   WBC 17.7 (*)    MCV 101.7 (*)    Neutro Abs 13.8 (*)    All other components within  normal limits  T4, FREE - Abnormal; Notable for the following components:   Free T4 1.19 (*)    All other components within normal limits  CBG MONITORING, ED - Abnormal; Notable for the following components:   Glucose-Capillary 173 (*)    All other components within normal limits  I-STAT VENOUS BLOOD GAS, ED - Abnormal; Notable for the following components:   pO2, Ven 54.0 (*)    All other components within normal limits  URINE CULTURE  AMMONIA  ETHANOL  LACTIC ACID, PLASMA  TSH  URINALYSIS, ROUTINE W REFLEX MICROSCOPIC  LACTIC ACID, PLASMA  TROPONIN I (HIGH SENSITIVITY)    EKG EKG Interpretation  Date/Time:  Tuesday June 06 2020 22:41:57 EDT Ventricular Rate:  74 PR Interval:    QRS Duration: 98 QT Interval:  413 QTC Calculation: 459 R Axis:   114 Text Interpretation: Right and left arm electrode reversal, interpretation assumes no reversal Normal sinus rhythm Right axis deviation Abnormal lateral Q waves Repol abnrm suggests ischemia, lateral leads ST elevation, consider inferior injury No significant change since last tracing Confirmed by Deno Etienne [phone]) on 06/06/2020 10:43:58 PM   Radiology DG Chest 2 View  Result Date: 06/06/2020 CLINICAL DATA:  Weakness, malaise EXAM: CHEST - 2 VIEW COMPARISON:  10/09/2018 FINDINGS: Surgical clips are seen within the right hilum in surgical staple line is seen within the right suprahilar region likely related to right upper lobectomy. There is chronic collapse and consolidation of the left upper lobe with associated left-sided volume loss. No superimposed confluent pulmonary infiltrate. No pneumothorax or pleural effusion. Cardiac size within normal limits. The pulmonary vascularity is normal. No acute bone abnormality. IMPRESSION: Stable examination with chronic  collapse of the left upper lobe and probable surgical changes of right upper lobectomy. No acute cardiopulmonary disease. Electronically Signed   By: Fidela Salisbury MD   On:  06/06/2020 22:29   CT HEAD WO CONTRAST  Result Date: 06/06/2020 CLINICAL DATA:  86 year old female with altered mental status. EXAM: CT HEAD WITHOUT CONTRAST TECHNIQUE: Contiguous axial images were obtained from the base of the skull through the vertex without intravenous contrast. COMPARISON:  None. FINDINGS: Brain: Moderate age-related atrophy and chronic microvascular ischemic changes. See there is no acute intracranial hemorrhage. No mass effect or midline shift. No extra-axial fluid collection. Vascular: No hyperdense vessel or unexpected calcification. Skull: Normal. Negative for fracture or focal lesion. Sinuses/Orbits: No acute finding. Other: None IMPRESSION: 1. No acute intracranial pathology. 2. Moderate age-related atrophy and chronic microvascular ischemic changes. Electronically Signed   By: Anner Crete M.D.   On: 06/06/2020 22:36    Procedures Procedures (including critical care time)  Medications Ordered in ED Medications  ondansetron (ZOFRAN-ODT) disintegrating tablet 4 mg (has no administration in time range)  sodium chloride 0.9 % bolus 1,000 mL (has no administration in time range)    ED Course  I have reviewed the triage vital signs and the nursing notes.  Pertinent labs & imaging results that were available during my care of the patient were reviewed by me and considered in my medical decision making (see chart for details).    MDM Rules/Calculators/A&P                          86 yo F with a chief complaint yesterday afternoon. Found on her porch today acting differently than normal. We'll obtain an altered mental status work-up.   CT scan of the head is negative.  Chest x-ray without focal infiltrate or pneumothorax.  Lab work is largely unremarkable with exception of a significant leukocytosis of 17,000.  Patient reassessed and seems more cognizant of what is happened.  Tells me that she thinks she got sick after what ever she ate at dinner.  Continues to deny  any abdominal tenderness.  Repeat abdominal exam without any focal tenderness.  Awaiting UA.  Will attempt an oral trial.  Signed out to Dr. Roxanne Mins, please see their note for further details of care in the ED.  The patients results and plan were reviewed and discussed.   Any x-rays performed were independently reviewed by myself.   Differential diagnosis were considered with the presenting HPI.  Medications  ondansetron (ZOFRAN-ODT) disintegrating tablet 4 mg (has no administration in time range)  sodium chloride 0.9 % bolus 1,000 mL (has no administration in time range)    Vitals:   06/06/20 2255 06/06/20 2300  BP: (!) 170/63   Pulse: 71   Resp: 20   Temp:  (!) 97.2 F (36.2 C)  TempSrc:  Axillary  SpO2: 98%     Final diagnoses:  Nausea and vomiting in adult      Final Clinical Impression(s) / ED Diagnoses Final diagnoses:  Nausea and vomiting in adult    Rx / DC Orders ED Discharge Orders    None       Deno Etienne, DO 06/06/20 2333

## 2020-06-06 NOTE — ED Triage Notes (Signed)
Pt arrived by EMS from home after a neighbor found her sitting in a chair on her porch. Per a different neighbor, last known well Monday at [place]. Pt able to follow commands on arrival, generalized weakness noted seems worse on the left side. One episode of emesis on arrival. R pupil slightly larger than left. Dr.Floyd at bedside

## 2020-06-07 ENCOUNTER — Observation Stay (HOSPITAL_COMMUNITY): Payer: Medicare Other

## 2020-06-07 ENCOUNTER — Emergency Department (HOSPITAL_COMMUNITY): Payer: Medicare Other

## 2020-06-07 ENCOUNTER — Other Ambulatory Visit: Payer: Self-pay

## 2020-06-07 DIAGNOSIS — Z86711 Personal history of pulmonary embolism: Secondary | ICD-10-CM | POA: Diagnosis not present

## 2020-06-07 DIAGNOSIS — H5500 Unspecified nystagmus: Secondary | ICD-10-CM | POA: Diagnosis present

## 2020-06-07 DIAGNOSIS — I63212 Cerebral infarction due to unspecified occlusion or stenosis of left vertebral arteries: Secondary | ICD-10-CM | POA: Diagnosis not present

## 2020-06-07 DIAGNOSIS — E039 Hypothyroidism, unspecified: Secondary | ICD-10-CM

## 2020-06-07 DIAGNOSIS — K5730 Diverticulosis of large intestine without perforation or abscess without bleeding: Secondary | ICD-10-CM | POA: Diagnosis not present

## 2020-06-07 DIAGNOSIS — Z823 Family history of stroke: Secondary | ICD-10-CM | POA: Diagnosis not present

## 2020-06-07 DIAGNOSIS — H532 Diplopia: Secondary | ICD-10-CM | POA: Diagnosis present

## 2020-06-07 DIAGNOSIS — H5340 Unspecified visual field defects: Secondary | ICD-10-CM | POA: Diagnosis present

## 2020-06-07 DIAGNOSIS — R2681 Unsteadiness on feet: Secondary | ICD-10-CM | POA: Diagnosis not present

## 2020-06-07 DIAGNOSIS — R0602 Shortness of breath: Secondary | ICD-10-CM | POA: Diagnosis not present

## 2020-06-07 DIAGNOSIS — I6340 Cerebral infarction due to embolism of unspecified cerebral artery: Secondary | ICD-10-CM | POA: Diagnosis not present

## 2020-06-07 DIAGNOSIS — R112 Nausea with vomiting, unspecified: Secondary | ICD-10-CM | POA: Diagnosis not present

## 2020-06-07 DIAGNOSIS — K529 Noninfective gastroenteritis and colitis, unspecified: Secondary | ICD-10-CM | POA: Diagnosis present

## 2020-06-07 DIAGNOSIS — I63432 Cerebral infarction due to embolism of left posterior cerebral artery: Secondary | ICD-10-CM | POA: Diagnosis not present

## 2020-06-07 DIAGNOSIS — R498 Other voice and resonance disorders: Secondary | ICD-10-CM | POA: Diagnosis not present

## 2020-06-07 DIAGNOSIS — I63443 Cerebral infarction due to embolism of bilateral cerebellar arteries: Secondary | ICD-10-CM | POA: Diagnosis not present

## 2020-06-07 DIAGNOSIS — J439 Emphysema, unspecified: Secondary | ICD-10-CM | POA: Diagnosis present

## 2020-06-07 DIAGNOSIS — I63233 Cerebral infarction due to unspecified occlusion or stenosis of bilateral carotid arteries: Secondary | ICD-10-CM | POA: Diagnosis not present

## 2020-06-07 DIAGNOSIS — I469 Cardiac arrest, cause unspecified: Secondary | ICD-10-CM | POA: Diagnosis not present

## 2020-06-07 DIAGNOSIS — J432 Centrilobular emphysema: Secondary | ICD-10-CM

## 2020-06-07 DIAGNOSIS — E872 Acidosis: Secondary | ICD-10-CM | POA: Diagnosis present

## 2020-06-07 DIAGNOSIS — N184 Chronic kidney disease, stage 4 (severe): Secondary | ICD-10-CM | POA: Diagnosis not present

## 2020-06-07 DIAGNOSIS — I6389 Other cerebral infarction: Secondary | ICD-10-CM | POA: Diagnosis not present

## 2020-06-07 DIAGNOSIS — M2550 Pain in unspecified joint: Secondary | ICD-10-CM | POA: Diagnosis not present

## 2020-06-07 DIAGNOSIS — M6281 Muscle weakness (generalized): Secondary | ICD-10-CM | POA: Diagnosis not present

## 2020-06-07 DIAGNOSIS — Z20822 Contact with and (suspected) exposure to covid-19: Secondary | ICD-10-CM | POA: Diagnosis present

## 2020-06-07 DIAGNOSIS — R278 Other lack of coordination: Secondary | ICD-10-CM | POA: Diagnosis not present

## 2020-06-07 DIAGNOSIS — R1312 Dysphagia, oropharyngeal phase: Secondary | ICD-10-CM | POA: Diagnosis not present

## 2020-06-07 DIAGNOSIS — N390 Urinary tract infection, site not specified: Secondary | ICD-10-CM | POA: Diagnosis not present

## 2020-06-07 DIAGNOSIS — R41841 Cognitive communication deficit: Secondary | ICD-10-CM | POA: Diagnosis not present

## 2020-06-07 DIAGNOSIS — G9341 Metabolic encephalopathy: Secondary | ICD-10-CM | POA: Diagnosis present

## 2020-06-07 DIAGNOSIS — I639 Cerebral infarction, unspecified: Secondary | ICD-10-CM | POA: Diagnosis not present

## 2020-06-07 DIAGNOSIS — I10 Essential (primary) hypertension: Secondary | ICD-10-CM | POA: Diagnosis not present

## 2020-06-07 DIAGNOSIS — E890 Postprocedural hypothyroidism: Secondary | ICD-10-CM | POA: Diagnosis present

## 2020-06-07 DIAGNOSIS — I129 Hypertensive chronic kidney disease with stage 1 through stage 4 chronic kidney disease, or unspecified chronic kidney disease: Secondary | ICD-10-CM | POA: Diagnosis present

## 2020-06-07 DIAGNOSIS — R29702 NIHSS score 2: Secondary | ICD-10-CM | POA: Diagnosis present

## 2020-06-07 DIAGNOSIS — B9689 Other specified bacterial agents as the cause of diseases classified elsewhere: Secondary | ICD-10-CM | POA: Diagnosis not present

## 2020-06-07 DIAGNOSIS — C3490 Malignant neoplasm of unspecified part of unspecified bronchus or lung: Secondary | ICD-10-CM

## 2020-06-07 DIAGNOSIS — Z79899 Other long term (current) drug therapy: Secondary | ICD-10-CM | POA: Diagnosis not present

## 2020-06-07 DIAGNOSIS — B961 Klebsiella pneumoniae [K. pneumoniae] as the cause of diseases classified elsewhere: Secondary | ICD-10-CM | POA: Diagnosis present

## 2020-06-07 DIAGNOSIS — F05 Delirium due to known physiological condition: Secondary | ICD-10-CM | POA: Diagnosis not present

## 2020-06-07 DIAGNOSIS — C3492 Malignant neoplasm of unspecified part of left bronchus or lung: Secondary | ICD-10-CM | POA: Diagnosis not present

## 2020-06-07 DIAGNOSIS — D7589 Other specified diseases of blood and blood-forming organs: Secondary | ICD-10-CM | POA: Diagnosis not present

## 2020-06-07 DIAGNOSIS — Z7401 Bed confinement status: Secondary | ICD-10-CM | POA: Diagnosis not present

## 2020-06-07 DIAGNOSIS — R05 Cough: Secondary | ICD-10-CM | POA: Diagnosis not present

## 2020-06-07 DIAGNOSIS — Z923 Personal history of irradiation: Secondary | ICD-10-CM | POA: Diagnosis not present

## 2020-06-07 DIAGNOSIS — Z86718 Personal history of other venous thrombosis and embolism: Secondary | ICD-10-CM | POA: Diagnosis not present

## 2020-06-07 DIAGNOSIS — R569 Unspecified convulsions: Secondary | ICD-10-CM | POA: Diagnosis not present

## 2020-06-07 DIAGNOSIS — E785 Hyperlipidemia, unspecified: Secondary | ICD-10-CM | POA: Diagnosis present

## 2020-06-07 DIAGNOSIS — Z87891 Personal history of nicotine dependence: Secondary | ICD-10-CM | POA: Diagnosis not present

## 2020-06-07 DIAGNOSIS — R946 Abnormal results of thyroid function studies: Secondary | ICD-10-CM | POA: Diagnosis not present

## 2020-06-07 DIAGNOSIS — R4182 Altered mental status, unspecified: Secondary | ICD-10-CM | POA: Diagnosis not present

## 2020-06-07 DIAGNOSIS — J479 Bronchiectasis, uncomplicated: Secondary | ICD-10-CM | POA: Diagnosis not present

## 2020-06-07 DIAGNOSIS — J449 Chronic obstructive pulmonary disease, unspecified: Secondary | ICD-10-CM | POA: Diagnosis not present

## 2020-06-07 DIAGNOSIS — R2689 Other abnormalities of gait and mobility: Secondary | ICD-10-CM | POA: Diagnosis not present

## 2020-06-07 LAB — CBC
HCT: 41.5 % (ref 36.0–46.0)
Hemoglobin: 13.1 g/dL (ref 12.0–15.0)
MCH: 32 pg (ref 26.0–34.0)
MCHC: 31.6 g/dL (ref 30.0–36.0)
MCV: 101.5 fL — ABNORMAL HIGH (ref 80.0–100.0)
Platelets: 221 10*3/uL (ref 150–400)
RBC: 4.09 MIL/uL (ref 3.87–5.11)
RDW: 12.1 % (ref 11.5–15.5)
WBC: 12.2 10*3/uL — ABNORMAL HIGH (ref 4.0–10.5)
nRBC: 0 % (ref 0.0–0.2)

## 2020-06-07 LAB — URINALYSIS, ROUTINE W REFLEX MICROSCOPIC
Bilirubin Urine: NEGATIVE
Glucose, UA: NEGATIVE mg/dL
Hgb urine dipstick: NEGATIVE
Ketones, ur: 5 mg/dL — AB
Nitrite: NEGATIVE
Protein, ur: 100 mg/dL — AB
Specific Gravity, Urine: 1.015 (ref 1.005–1.030)
pH: 6 (ref 5.0–8.0)

## 2020-06-07 LAB — BASIC METABOLIC PANEL
Anion gap: 12 (ref 5–15)
BUN: 27 mg/dL — ABNORMAL HIGH (ref 8–23)
CO2: 24 mmol/L (ref 22–32)
Calcium: 9.2 mg/dL (ref 8.9–10.3)
Chloride: 104 mmol/L (ref 98–111)
Creatinine, Ser: 1.29 mg/dL — ABNORMAL HIGH (ref 0.44–1.00)
GFR calc Af Amer: 43 mL/min — ABNORMAL LOW (ref >60)
GFR calc non Af Amer: 37 mL/min — ABNORMAL LOW (ref >60)
Glucose, Bld: 158 mg/dL — ABNORMAL HIGH (ref 70–99)
Potassium: 4.3 mmol/L (ref 3.5–5.1)
Sodium: 140 mmol/L (ref 135–145)

## 2020-06-07 LAB — LACTIC ACID, PLASMA: Lactic Acid, Venous: 1.3 mmol/L (ref 0.5–1.9)

## 2020-06-07 LAB — SARS CORONAVIRUS 2 BY RT PCR (HOSPITAL ORDER, PERFORMED IN ~~LOC~~ HOSPITAL LAB): SARS Coronavirus 2: NEGATIVE

## 2020-06-07 MED ORDER — ENOXAPARIN SODIUM 30 MG/0.3ML ~~LOC~~ SOLN
30.0000 mg | SUBCUTANEOUS | Status: DC
  Administered 2020-06-08 – 2020-06-11 (×4): 30 mg via SUBCUTANEOUS
  Filled 2020-06-07 (×4): qty 0.3

## 2020-06-07 MED ORDER — STROKE: EARLY STAGES OF RECOVERY BOOK: Freq: Once | Status: DC

## 2020-06-07 MED ORDER — ACETAMINOPHEN 325 MG PO TABS
650.0000 mg | ORAL_TABLET | Freq: Four times a day (QID) | ORAL | Status: DC | PRN
  Administered 2020-06-12: 650 mg via ORAL
  Filled 2020-06-07: qty 2

## 2020-06-07 MED ORDER — ASPIRIN 300 MG RE SUPP
300.0000 mg | Freq: Every day | RECTAL | Status: DC
  Filled 2020-06-07 (×5): qty 1

## 2020-06-07 MED ORDER — ASPIRIN 325 MG PO TABS
325.0000 mg | ORAL_TABLET | Freq: Every day | ORAL | Status: DC
  Administered 2020-06-08 – 2020-06-12 (×5): 325 mg via ORAL
  Filled 2020-06-07 (×5): qty 1

## 2020-06-07 MED ORDER — ATENOLOL 25 MG PO TABS
50.0000 mg | ORAL_TABLET | Freq: Every day | ORAL | Status: DC
  Administered 2020-06-08 – 2020-06-13 (×6): 50 mg via ORAL
  Filled 2020-06-07 (×7): qty 2

## 2020-06-07 MED ORDER — ONDANSETRON HCL 4 MG/2ML IJ SOLN
4.0000 mg | Freq: Four times a day (QID) | INTRAMUSCULAR | Status: DC | PRN
  Administered 2020-06-07 – 2020-06-12 (×2): 4 mg via INTRAVENOUS
  Filled 2020-06-07 (×2): qty 2

## 2020-06-07 MED ORDER — PROCHLORPERAZINE EDISYLATE 10 MG/2ML IJ SOLN
10.0000 mg | Freq: Once | INTRAMUSCULAR | Status: AC
  Administered 2020-06-07: 10 mg via INTRAVENOUS
  Filled 2020-06-07: qty 2

## 2020-06-07 MED ORDER — ACETAMINOPHEN 325 MG PO TABS: 650.0000 mg | ORAL_TABLET | Freq: Three times a day (TID) | ORAL | Status: DC

## 2020-06-07 MED ORDER — HYDROCODONE-ACETAMINOPHEN 5-325 MG PO TABS
1.0000 | ORAL_TABLET | ORAL | Status: DC | PRN
  Administered 2020-06-10: 1 via ORAL
  Filled 2020-06-07: qty 1

## 2020-06-07 MED ORDER — POTASSIUM CHLORIDE IN NACL 20-0.9 MEQ/L-% IV SOLN
INTRAVENOUS | Status: AC
  Filled 2020-06-07 (×2): qty 1000

## 2020-06-07 MED ORDER — MORPHINE SULFATE (PF) 2 MG/ML IV SOLN: 2.0000 mg | INTRAVENOUS | Status: DC | PRN

## 2020-06-07 MED ORDER — ACETAMINOPHEN 650 MG RE SUPP: 650.0000 mg | Freq: Four times a day (QID) | RECTAL | Status: DC | PRN

## 2020-06-07 MED ORDER — PRAVASTATIN SODIUM 40 MG PO TABS
40.0000 mg | ORAL_TABLET | Freq: Every day | ORAL | Status: DC
  Administered 2020-06-08 – 2020-06-13 (×6): 40 mg via ORAL
  Filled 2020-06-07 (×6): qty 1

## 2020-06-07 MED ORDER — POLYETHYLENE GLYCOL 3350 17 G PO PACK: 17.0000 g | PACK | Freq: Every day | ORAL | Status: DC | PRN

## 2020-06-07 MED ORDER — LEVOTHYROXINE SODIUM 50 MCG PO TABS
50.0000 ug | ORAL_TABLET | Freq: Every day | ORAL | Status: DC
  Administered 2020-06-08 – 2020-06-13 (×6): 50 ug via ORAL
  Filled 2020-06-07 (×6): qty 1

## 2020-06-07 MED ORDER — ONDANSETRON HCL 4 MG PO TABS: 4.0000 mg | ORAL_TABLET | Freq: Four times a day (QID) | ORAL | Status: DC | PRN

## 2020-06-07 MED ORDER — AMLODIPINE BESYLATE 5 MG PO TABS
5.0000 mg | ORAL_TABLET | Freq: Every day | ORAL | Status: DC
  Administered 2020-06-07 – 2020-06-08 (×2): 5 mg via ORAL
  Filled 2020-06-07 (×2): qty 1

## 2020-06-07 MED ORDER — ALBUTEROL SULFATE (2.5 MG/3ML) 0.083% IN NEBU: 2.5000 mg | INHALATION_SOLUTION | RESPIRATORY_TRACT | Status: DC | PRN

## 2020-06-07 NOTE — ED Notes (Signed)
Admitting MD at bedside.

## 2020-06-07 NOTE — ED Notes (Signed)
TC to [place] with an up date

## 2020-06-07 NOTE — ED Notes (Signed)
Pt given water to fluid challenge. Pt immedietly threw it up

## 2020-06-07 NOTE — ED Notes (Signed)
Breakfast Ordered--[place]  

## 2020-06-07 NOTE — Progress Notes (Signed)
Got to patient's room in ED and was advised she is heading to the floor; will attempt EEG tomorrow as schedule permits.

## 2020-06-07 NOTE — ED Notes (Signed)
Please call daughter April Melamed @  [phone] a status update--Imelda Dandridge

## 2020-06-07 NOTE — Consult Note (Addendum)
Neurology Consultation  Reason for Consult: Left eye nystagmus with abduction Referring Physician: Kerney Elbe, DO  CC: Left eye nystagmus with abduction  History is obtained from: Daughter and patient  HPI: Izetta A Kleeman is a 86 y.o. 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  Per daughter patient was noted by a neighbor to be sitting on the porch and not acting her normal self.  Patient had told the neighbor that she was not feeling very well and had thrown up.  In addition patient also was complaining of epigastric pain.  Yesterday at approximately 4:00 patient states that she started to note she had double vision.  Patient was brought to the emergency department due to her epigastric pain, nausea and vomiting.  While in the emergency department it was noted the patient had abnormal eye movements.  Specifically that her left eye was deviated laterally with significant nystagmus.  On consultation patient is alert able to follow commands.  She is not significant complaining of double vision.  But is able to state that at 4:00 PM yesterday is when she started to feel that her vision was off.  She denies any weakness or numbness throughout.  She denies any difficulty swallowing.  She currently does denies any abdominal pain nausea or recent vomiting.  ED course  Relevant labs include -initially WBC was elevated at 17 however it is trending down CT head shows-CT head did not show any acute intracranial pathology  LKW: 74/13/2021 at 1600 hrs. tpa given?: no, out of window Premorbid modified Rankin scale (mRS): 0    Past Medical History:  Diagnosis Date  . 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 Cancer ([place])    right lung ca - 30 years ago / left lung - 11/2012  . Cancer ([place])    Left lung  . CKD (chronic kidney disease)   . COPD (chronic obstructive pulmonary disease) ([place])   . DJD (degenerative joint  disease)    knees  . H/O: lung cancer 1982   Right; treated with resection  . Hypertension   . Hypothyroidism   . PND (post-nasal drip)    Chronic    Family History  Problem Relation Age of Onset  . CVA Mother 51       hemorrhagic    Social History:   reports that she quit smoking about 39 years ago. Her smoking use included cigarettes. She has a 35.00 pack-year smoking history. She has never used smokeless tobacco. She reports that she does not drink alcohol and does not use drugs.  Medications  Current Facility-Administered Medications:  .  0.9 % NaCl with KCl 20 mEq/ L  infusion, , Intravenous, Continuous, Raiford Noble [place], [place], Last Rate: 125 mL/hr at 06/07/20 0403, New Bag at 06/07/20 0403 .  acetaminophen (TYLENOL) tablet 650 mg, 650 mg, Oral, Q6H PRN **OR** acetaminophen (TYLENOL) suppository 650 mg, 650 mg, Rectal, Q6H PRN, Poplawski, Rafal, MD .  albuterol (PROVENTIL) (2.5 MG/3ML) 0.083% nebulizer solution 2.5 mg, 2.5 mg, Nebulization, Q2H PRN, Poplawski, Rafal, MD .  amLODipine (NORVASC) tablet 5 mg, 5 mg, Oral, Daily, Poplawski, Rafal, MD, 5 mg at 06/07/20 1056 .  atenolol (TENORMIN) tablet 50 mg, 50 mg, Oral, Daily, Poplawski, Rafal, MD .  enoxaparin (LOVENOX) injection 30 mg, 30 mg, Subcutaneous, Q24H, Poplawski, Rafal, MD .  HYDROcodone-acetaminophen (NORCO/VICODIN) 5-325 MG per tablet 1-2 tablet, 1-2 tablet, Oral, Q4H PRN, Poplawski, Rafal, MD .  levothyroxine (  SYNTHROID) tablet 50 mcg, 50 mcg, Oral, QAC breakfast, Poplawski, Rafal, MD .  morphine 2 MG/ML injection 2 mg, 2 mg, Intravenous, Q2H PRN, Poplawski, Rafal, MD .  ondansetron (ZOFRAN) tablet 4 mg, 4 mg, Oral, Q6H PRN **OR** ondansetron (ZOFRAN) injection 4 mg, 4 mg, Intravenous, Q6H PRN, Poplawski, Rafal, MD .  polyethylene glycol (MIRALAX / GLYCOLAX) packet 17 g, 17 g, Oral, Daily PRN, Poplawski, Rafal, MD .  pravastatin (PRAVACHOL) tablet 40 mg, 40 mg, Oral, Daily, Poplawski, Rafal, MD  Current Outpatient  Medications:  .  acetaminophen (TYLENOL) 325 MG tablet, Take 650 mg by mouth 3 (three) times daily. TWO TO THREE TIMES A DAY, Disp: , Rfl:  .  albuterol (PROVENTIL HFA;VENTOLIN HFA) 108 (90 Base) MCG/ACT inhaler, Inhale 2 puffs into the lungs every 6 (six) hours as needed for wheezing or shortness of breath., Disp: 1 Inhaler, Rfl: 5 .  atenolol (TENORMIN) 50 MG tablet, Take 1 tablet (50 mg total) by mouth daily., Disp: 90 tablet, Rfl: 3 .  atorvastatin (LIPITOR) 10 MG tablet, Take 10 mg by mouth daily., Disp: , Rfl:  .  Cholecalciferol (VITAMIN D-3) 1000 units CAPS, Take 3,000 Units by mouth daily., Disp: , Rfl:  .  levothyroxine (SYNTHROID, LEVOTHROID) 50 MCG tablet, Take 50 mcg by mouth daily before breakfast. , Disp: , Rfl: 8 .  amLODipine (NORVASC) 5 MG tablet, TAKE 1 TABLET (5 MG TOTAL) BY MOUTH AT BEDTIME. (Patient taking differently: Take 5 mg by mouth daily. ), Disp: 90 tablet, Rfl: 0 .  cetirizine (ZYRTEC) 10 MG tablet, TAKE 1 TABLET (10 MG TOTAL) BY MOUTH AT BEDTIME. (Patient taking differently: Take 10 mg by mouth daily. ), Disp: 30 tablet, Rfl: 2 .  guaiFENesin (MUCINEX) 600 MG 12 hr tablet, Take 2 tablets (1,200 mg total) by mouth 2 (two) times daily., Disp: 20 tablet, Rfl: 0 .  polyvinyl alcohol (LIQUIFILM TEARS) 1.4 % ophthalmic solution, Place 1 drop into both eyes as needed for dry eyes., Disp: , Rfl:  .  pravastatin (PRAVACHOL) 40 MG tablet, Take 40 mg by mouth daily., Disp: , Rfl: 3  ROS:   General ROS: negative for - chills, fatigue, fever, night sweats, weight gain or weight loss Psychological ROS: negative for - behavioral disorder, hallucinations, memory difficulties, mood swings or suicidal ideation Ophthalmic ROS: Positive for - double vision ENT ROS: negative for - epistaxis, nasal discharge, oral lesions, sore throat, tinnitus or vertigo Allergy and Immunology ROS: negative for - hives or itchy/watery eyes Hematological and Lymphatic ROS: negative for - bleeding  problems, bruising or swollen lymph nodes Endocrine ROS: negative for - galactorrhea, hair pattern changes, polydipsia/polyuria or temperature intolerance Respiratory ROS: negative for - cough, hemoptysis, shortness of breath or wheezing Cardiovascular ROS: negative for - chest pain, dyspnea on exertion, edema or irregular heartbeat Gastrointestinal ROS: negative for - abdominal pain, diarrhea, hematemesis, nausea/vomiting or stool incontinence Genito-Urinary ROS: negative for - dysuria, hematuria, incontinence or urinary frequency/urgency Musculoskeletal ROS: negative for - joint swelling or muscular weakness Neurological ROS: as noted in HPI Dermatological ROS: negative for rash and skin lesion changes  Exam: Current vital signs: BP (!) 151/90   Pulse 81   Temp (!) 97.2 F (36.2 C) (Axillary)   Resp 15   SpO2 97%  Vital signs in last 24 hours: Temp:  [97.2 F (36.2 C)] 97.2 F (36.2 C) (07/13 2300) Pulse Rate:  [71-86] 81 (07/14 1344) Resp:  [12-23] 15 (07/14 1056) BP: (149-170)/(58-122) 151/90 (07/14 1344) SpO2:  [92 %-  98 %] 97 % (07/14 1344)   Constitutional: Appears well-developed and well-nourished.  Psych: Affect appropriate to situation Eyes: No scleral injection HENT: No OP obstrucion Head: Normocephalic.  Cardiovascular: Normal rate and regular rhythm.  Respiratory: Effort normal, non-labored breathing GI: Soft.  No distension. There is no tenderness.  Skin: WDI  Neuro: Mental Status: Patient is awake, alert.  Patient has no dysarthria or aphasia.  Patient is able to give some history.  Patient is able to follow commands without difficulty. Cranial Nerves: II: right hemianopsia.  III,IV, VI: Right eye cannot adduct when looking to the left but is able to abduct and has vertical movement V: Facial sensation is symmetric to temperature VII: Facial movement is symmetric.  VIII: hearing is intact to voice X: Palat elevates symmetrically XI: Shoulder shrug is  symmetric. XII: tongue is midline without atrophy or fasciculations.  Motor: Tone is normal. Bulk is normal. 5/5 strength was present in all four extremities. Drift Sensory: Sensation is symmetric to light touch and temperature in the arms and legs. Deep Tendon Reflexes: 2+ and symmetric in the biceps and patellae.  Plantars: Toes are downgoing bilaterally.  Cerebellar: FNF shows no dysmetria however she is past-pointing secondary to diplopia,  HKS are intact bilaterally  Labs I have reviewed labs in epic and the results pertinent to this consultation are:  CBC    Component Value Date/Time   WBC 12.2 (H) 06/07/2020 0414   RBC 4.09 06/07/2020 0414   HGB 13.1 06/07/2020 0414   HGB 13.2 10/08/2017 1026   HCT 41.5 06/07/2020 0414   HCT 40.3 10/08/2017 1026   PLT 221 06/07/2020 0414   PLT 239 10/08/2017 1026   MCV 101.5 (H) 06/07/2020 0414   MCV 99.0 10/08/2017 1026   MCH 32.0 06/07/2020 0414   MCHC 31.6 06/07/2020 0414   RDW 12.1 06/07/2020 0414   RDW 13.0 10/08/2017 1026   LYMPHSABS 2.7 06/06/2020 2217   LYMPHSABS 1.5 10/08/2017 1026   MONOABS 1.0 06/06/2020 2217   MONOABS 0.6 10/08/2017 1026   EOSABS 0.1 06/06/2020 2217   EOSABS 0.1 10/08/2017 1026   BASOSABS 0.1 06/06/2020 2217   BASOSABS 0.1 10/08/2017 1026    CMP     Component Value Date/Time   NA 140 06/07/2020 0414   NA 143 10/08/2017 1026   K 4.3 06/07/2020 0414   K 4.5 10/08/2017 1026   CL 104 06/07/2020 0414   CO2 24 06/07/2020 0414   CO2 26 10/08/2017 1026   GLUCOSE 158 (H) 06/07/2020 0414   GLUCOSE 101 10/08/2017 1026   BUN 27 (H) 06/07/2020 0414   BUN 21.9 10/08/2017 1026   CREATININE 1.29 (H) 06/07/2020 0414   CREATININE 1.4 (H) 10/08/2017 1026   CALCIUM 9.2 06/07/2020 0414   CALCIUM 10.2 10/08/2017 1026   PROT 7.8 06/06/2020 2217   PROT 7.4 10/08/2017 1026   ALBUMIN 4.2 06/06/2020 2217   ALBUMIN 4.0 10/08/2017 1026   AST 23 06/06/2020 2217   AST 19 10/08/2017 1026   ALT 14 06/06/2020 2217    ALT 11 10/08/2017 1026   ALKPHOS 73 06/06/2020 2217   ALKPHOS 88 10/08/2017 1026   BILITOT 0.6 06/06/2020 2217   BILITOT 0.67 10/08/2017 1026   GFRNONAA 37 (L) 06/07/2020 0414   GFRAA 43 (L) 06/07/2020 0414    Imaging I have reviewed the images obtained:  CT-scan of the brain-No acute intracranial pathology.   Etta Quill PA-C [facility] [phone]  M-F  (9:00 am- 5:00 PM)  06/07/2020,  2:54 PM     Assessment:  86 year old female who presented to the emergency department secondary to nausea vomiting and abdominal pain.  While in the ED it was noticed that patient had abnormal eye movements and left eye was deviated to the left with nystagmus.  Neurology was consulted to evaluate. Exam shows right hemianopsia and right INO.  MRI does show that she has multiple strokes in the bilateral cerebellar and left PCA distribution including right MLF area. MRA head with left P2 occlusion. Also possible left PICA occlusion Given that strokes are all in the PCA territory-etiology is likely artery to artery embolism versus cardioembolic.  Impression: Acute ischemic stroke Left PCA-P2 segment occlusion  Recommend - carotid US neck  -Transthoracic Echo -May need long term cardiac monitoring -Start patient on ASA 325mg  daily -Start or continue Atorvastatin 80 mg/other high intensity statin -BP goal: permissive HTN upto 220/120 mmHg -HBAIC and Lipid profile -Telemetry monitoring -Frequent neuro checks -NPO until passes stroke swallow screen -PT/OT # please page stroke NP  Or  PA  Or MD from 8am -4 pm  as this patient from this time will be  followed by the stroke.   You can look them up on [email or web address]  Password [facility]   Attending addendum Patient seen and examined Reports sudden onset of double vision sometime yesterday-around 4 PM. Prior to 4 PM was normal. No prior history of strokes Started having abdominal pain as well. Brought to the emergency room for  epigastric pain nausea and vomiting. Noted to have abnormal eye movements by the hospitalist who called for neurological consultation. Patient's daughter also at bedside-both of them are poor historians and cannot really tell me the sequence of events. Review of systems negative except as noted in HPI. On my examination, Patient is awake alert oriented to self. She is unable to tell me that she is in the hospital. She was unable to tell her correct age or the current month. Her speech is mildly hypophonic but not dysarthric. She is not aphasic Cranial nerves: Pupils are equal round reactive to light, extraocular movement examination shows inability of the right eye to [place] when looking to the left, with the limitation of the right eye reduction, there is left eye nystagmus consistent with an INO. She also has visual field deficit-right hemianopsia. Face is symmetric. Motor exam: She is antigravity in both upper extremities and lower extremities. Sensory exam: Intact to touch Coordination: No dysmetria. 1a Level of Conscious.: 0 1b LOC Questions: 2 1c LOC Commands:0  2 Best Gaze: 1 3 Visual: 2 4 Facial Palsy: 0 5a Motor Arm - left: 0 5b Motor Arm - Right: 0 6a Motor Leg - Left: 0 6b Motor Leg - Right: 0 7 Limb Ataxia: 0 8 Sensory: 0 9 Best Language: 0 10 Dysarthria: 0 11 Extinct. and Inatten.: 0 TOTAL: 5  MRI brain and MRA head personally reviewed: Multiple posterior circulation strokes on the brain MRI that include left occipital lobe, left hippocampus, dorsal left thalamus, bilateral cerebellar, dorsal medial right midbrain likely involving the MLF causing the INO and superior cerebellar peduncle on the left. MRA shows shows left P2 occlusion. Proximal left PICA is also poorly delineated.  Assessment and recommendations as above which I have edited and helped formulate. Posterior circulation strokes-cardioembolic versus atheroembolic  Please call with  questions  -- Amie Portland, MD [facility] Pager: [phone] If 7pm to 7am, please call on call as listed on AMION.

## 2020-06-07 NOTE — ED Notes (Signed)
Swallow screen not done because pt was nauseated

## 2020-06-07 NOTE — Progress Notes (Addendum)
Care started prior to midnight in the emergency room and patient admitted early this morning after midnight and having current agreement with assessment and plan done by Dr. Allie Dimmer.  Additional changes to the plan of care been made accordingly.  The patient is a 86 year old occasion female with a past medical history significant for but not limited to hypertension, hyperlipidemia, chronic kidney stage IIIb with a baseline creatinine of 1.4 hypothyroidism, history of lung adenocarcinoma as well as other comorbidities who came to the hospital for further evaluation of her confusion as well as having persistent nausea vomiting.  Patient states that she was in her normal usual state of health and remembers eating a small piece of sausage in the afternoon and subsequently felt ill.  She was seen by her neighbor sitting on the porch very confused and acting very different.  She stated that she felt very ill and unable to state what happened to her.  She reported nausea with vomiting and epigastric abdominal pain she is brought to the emergency room for her persisting symptoms.  Initially in the ED she was given IV fluid resuscitation and confusion improved however again she continued to have some nausea vomiting.  On admission she had a CBC showing a WBC of 17.7 which is now improved to 12.2.  There is no clear source of infection noted and she had a normal ammonia level.  TSH was also within normal limits at 4.041, however T4 was slightly elevated.  Urinalysis was not suggestive of infection but did show five ketones, moderate leukocytes, negative nitrites, few bacteria, 0-5 RBCs per high-power field, 0-5 squamous epithelial cells and 11-20 WBCs.  Urine culture still pending. She had a head CT without contrast which showed no acute intracranial pathology and showed moderate age-related atrophy and chronic microvascular ischemic changes.  She is currently admitted for I am being treated for the following but not  limited to:  Intractable nausea vomiting likely due to acute gastroenteritis Acute metabolic encephalopathy likely from above, Improved -Patient's reports possible food poisoning.  No other significant history reported. -WBC is trending down and went from 17.7 -> 12.2 -She received a bolus of 1 L in the ED; continue supportive care with IV fluids with normal saline at 125 mL's per hour with 20 mEq of KCl for 24 hours but will reduce the rate down to 75 MLS per hour given that she had a first 10 hours at 125 mL/h -Continue with Antiemetics with p.o./IV Zofran every 6 hours as needed for nausea; she is given a dose of IV Zofran as well as a dose of IV Compazine in the ED -Clear liquid diet advance as tolerated.  -Acute encephalopathy is improving and head CT was negative as above -Work up grossly unrevealing with essentially normal ABG, ammonia level, no acute signs of infection. -We will advance diet from a clear liquid diet to full liquid diet -Continue with pain control with p.o. Norco/Vicodin and IV morphine -We will obtain a PT OT evaluation to determine a safe discharge disposition for this patient  Hypertension  -Blood pressure stable. -Continue Amlodipine 5 mg po Daily and Atenolol 50 mg po Daily   Hypothyroidism -Thyroid function appears to be within normal limits at 4.041 -Free T4 was slightly elevated at 1.19 -Continue Levothyroxine 50 mcg po Daily   Hyperlipidemia -Continue with Pravastatin 40 m p.o. daily  Chronic Kidney Disease Stage III Metabolic Acidosis -Renal function is stable, with most recent creatinine being 1.4 -BUN/creatinine went from 34/1.29  on admission and repeat this morning was 27/1.29 -Patient CO2 on admission was 21, chloride level was 104, and anion gap was 14; now CO2 is 24, anion gap is 12 and chloride level is 140-avoid nephrotoxic medications, contrast dyes, hypotension and renally adjust medications  -Repeat CMP in a.m.  History of Lung  Adenocarcinoma -Patient continue to follow-up routinely with pulmonology for surveillance of cancer recurrence  ADDEDENDUM:**  Acute CVA -Started having difficulty with her left eye vision and had some deviation -Obtain an MRI showed acute CVA  -MRI of the head was also done -Neurology consulted and appreciate their recommendations next-stroke team to follow -EEG also done  We will continue to monitor patient's clinical response to intervention repeat blood work in the a.m. and continue IV hydration for now.  Will advance her diet slowly and will advance her to full liquid diet if she tolerates this will go to a soft diet.  Anticipating discharge in the next 24 to 48 hours but will need a PT and OT evaluation to determine a safe discharge disposition.

## 2020-06-07 NOTE — ED Provider Notes (Signed)
Care assumed from Dr. Tyrone Nine, patient with vomiting and altered mental status pending a urinalysis.  She did have a mild to moderate leukocytosis, but urinalysis showed no evidence of infection.  CT of head showed no acute process.  Labs show mild renal insufficiency which is unchanged from baseline.  She has been given IV ondansetron, but vomited after given oral fluids following that.  Son is at bedside and states that she is improved, but still not back to baseline.  On my exam, she is oriented to person and place but only partly oriented to time.  She will need to be admitted for ongoing fluid replacement, antiemetics, and observation to make sure her mental status returns to baseline.  Case is discussed with Dr. Corinna Capra of [facility], who agrees to admit the patient.   Delora Fuel, MD 65/78/46 [phone]

## 2020-06-07 NOTE — H&P (Signed)
History and Physical    VALORIE MCGRORY JJO:[id number] DOB: Apr 10, 1929 DOA: 06/06/2020  PCP: Collene Gobble, MD  Patient coming from:  Home I have personally briefly reviewed patient's old medical records in [place]  Chief Complaint: Confusion, persistent nausea vomiting  HPI: Valerie Young is a 86 y.o. female with medical history significant of hypertension, hyperlipidemia, chronic kidney disease stage III with baseline creatinine 1.4, hypothyroidism, history of lung adenocarcinoma who came to hospital for evaluation of confusion.  Patient stated that this morning she was in her usual state of health.  She remembers eating small piece of sausage in the afternoon, and subsequently acutely felt ill.  She was seen by her neighbor sitting on the porch, confused and acting differently than normal.  Patient stated that she felt very ill, unable to state what happened to her.  She reported nausea with vomiting and epigastric abdominal pain.  She was brought to the ED for evaluation of her symptoms due to its persistence. Currently feels much better after initial management in the ED. still continue to have nausea with vomiting, and inability to tolerate meals, but her confusion markedly improved.  ED Course:   In the ED patient was found afebrile temperature 97.2 F, Blood pressure was elevated 168/68 with heart rate 73/min.  No hypoxia or tachypnea. Work-up was grossly unremarkable. CBC shows leukocytosis 17.7, without anemia or clear source of infection. Chemistry reveals mild elevation of creatinine to 1.29, otherwise with normal electrolytes and LFTs. VBG showed pH 7.39 with PCO2 of 45.  Noted normal ammonia level of 17.  Normal TSH of 4.0 Lactic acid within normal limits 1.3 and 1.8.  Urinalysis not suggestive of infection. Chest x-ray revealed chronic collapse of left upper lobe and postop changes of right upper lobe. CT head revealed no acute intracranial abnormality. Patient was  treated with IV fluids and antiemetics but her symptoms were persisting. Decision was made to admit patient to the medical floor for further evaluation and treatment  Review of Systems: Review of Systems  Constitutional: Negative for chills and fever.  HENT: Negative for congestion, ear pain and hearing loss.   Respiratory: Negative for cough, sputum production and shortness of breath.   Cardiovascular: Negative for chest pain and leg swelling.  Gastrointestinal: Positive for abdominal pain, nausea and vomiting.  Genitourinary: Negative for dysuria, frequency and urgency.  Musculoskeletal: Negative for joint pain, myalgias and neck pain.  Neurological: Positive for weakness. Negative for dizziness, tingling, tremors, sensory change and speech change.  Psychiatric/Behavioral: Negative for depression. The patient is not nervous/anxious.    As per HPI otherwise all other systems reviewed and are negative.   Past Medical History:  Diagnosis Date  . 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 Cancer ([place])    right lung ca - 30 years ago / left lung - 11/2012  . Cancer ([place])    Left lung  . CKD (chronic kidney disease)   . COPD (chronic obstructive pulmonary disease) ([place])   . DJD (degenerative joint disease)    knees  . H/O: lung cancer 1982   Right; treated with resection  . Hypertension   . Hypothyroidism   . PND (post-nasal drip)    Chronic    Past Surgical History:  Procedure Laterality Date  . ABDOMINAL HYSTERECTOMY     PARTIAL  . CATARACT EXTRACTION, BILATERAL    . JOINT REPLACEMENT     RIGHT TOTAL KNEE  . RADIATION OF LEFT LUNG    .  right lung cancer treated with resection  1982  . right thigh postoperative hematoma  02-2008  . RIGHT UPPER LOBE REMOVED    . THYROID SURGERY    . THYROIDECTOMY    . TOTAL KNEE ARTHROPLASTY     Right  . VESICOVAGINAL FISTULA CLOSURE W/ TAH    . VIDEO BRONCHOSCOPY  12/08/2012   Procedure: VIDEO BRONCHOSCOPY WITH FLUORO;   Surgeon: Kathee Delton, MD;  Location: [facility];  Service: Cardiopulmonary;  Laterality: Bilateral;    Social History  reports that she quit smoking about 39 years ago. Her smoking use included cigarettes. She has a 35.00 pack-year smoking history. She has never used smokeless tobacco. She reports that she does not drink alcohol and does not use drugs.  Allergies  Allergen Reactions  . Sulfamethoxazole-Trimethoprim Rash    Family History  Problem Relation Age of Onset  . CVA Mother 65       hemorrhagic    Prior to Admission medications   Medication Sig Start Date End Date Taking? Authorizing Provider  acetaminophen (TYLENOL) 325 MG tablet Take 650 mg by mouth 3 (three) times daily. TWO TO THREE TIMES A DAY    [provider]  albuterol (PROVENTIL HFA;VENTOLIN HFA) 108 (90 Base) MCG/ACT inhaler Inhale 2 puffs into the lungs every 6 (six) hours as needed for wheezing or shortness of breath. 10/15/18   Collene Gobble, MD  amLODipine (NORVASC) 5 MG tablet TAKE 1 TABLET (5 MG TOTAL) BY MOUTH AT BEDTIME. Patient taking differently: Take 5 mg by mouth daily.     Tresa Garter, MD  atenolol (TENORMIN) 50 MG tablet Take 1 tablet (50 mg total) by mouth daily. 01/06/14   Tresa Garter, MD  cetirizine (ZYRTEC) 10 MG tablet TAKE 1 TABLET (10 MG TOTAL) BY MOUTH AT BEDTIME. Patient taking differently: Take 10 mg by mouth daily.  11/06/15   Collene Gobble, MD  Cholecalciferol (VITAMIN D-3) 1000 units CAPS Take 3,000 Units by mouth daily.    [provider]  guaiFENesin (MUCINEX) 600 MG 12 hr tablet Take 2 tablets (1,200 mg total) by mouth 2 (two) times daily. 10/06/15   Hosie Poisson, MD  levothyroxine (SYNTHROID, LEVOTHROID) 50 MCG tablet Take 50 mcg by mouth daily before breakfast.  09/12/15   [provider]  polyvinyl alcohol (LIQUIFILM TEARS) 1.4 % ophthalmic solution Place 1 drop into both eyes as needed for dry eyes.    [provider]    pravastatin (PRAVACHOL) 40 MG tablet Take 40 mg by mouth daily. 07/16/15   [provider]    Physical Exam: Vitals:   06/07/20 0100 06/07/20 0115 06/07/20 0130 06/07/20 0145  BP: (!) 162/69 (!) 163/67 (!) 166/68 (!) 168/68  Pulse: 76 75 74 73  Resp: 16 18 17 17   Temp:      TempSrc:      SpO2: 96% 95% 96% 94%    Constitutional: NAD, calm, comfortable Vitals:   06/07/20 0100 06/07/20 0115 06/07/20 0130 06/07/20 0145  BP: (!) 162/69 (!) 163/67 (!) 166/68 (!) 168/68  Pulse: 76 75 74 73  Resp: 16 18 17 17   Temp:      TempSrc:      SpO2: 96% 95% 96% 94%   Eyes: PERRL, lids and conjunctivae normal ENMT: Mucous membranes are moist. Posterior pharynx clear of any exudate or lesions.Normal dentition.  Neck: normal, supple, no masses, no thyromegaly Respiratory: clear to auscultation bilaterally, no wheezing, no crackles. Normal respiratory effort. No  accessory muscle use.  Cardiovascular: Regular rate and rhythm, no murmurs / rubs / gallops. No extremity edema. 2+ pedal pulses. No carotid bruits.  Abdomen: no tenderness, no masses palpated. No hepatosplenomegaly. Bowel sounds positive.  Musculoskeletal: no clubbing / cyanosis. No joint deformity upper and lower extremities. Good ROM, no contractures. Normal muscle tone.  Skin: no rashes, lesions, ulcers. No induration Neurologic: CN 2-12 grossly intact. Sensation intact, DTR normal. Strength 5/5 in all 4.  Psychiatric: Normal judgment and insight. Alert and oriented x 3. Normal mood.     Labs on Admission: I have personally reviewed following labs and imaging studies  CBC: Recent Labs  Lab 06/06/20 2217 06/06/20 2221  WBC 17.7*  --   NEUTROABS 13.8*  --   HGB 13.8 14.3  HCT 42.7 42.0  MCV 101.7*  --   PLT 250  --     Basic Metabolic Panel: Recent Labs  Lab 06/06/20 2217 06/06/20 2221  NA 139 140  K 3.9 4.0  CL 104  --   CO2 21*  --   GLUCOSE 174*  --   BUN 34*  --   CREATININE 1.29*  --   CALCIUM 9.9   --     GFR: CrCl cannot be calculated (Unknown ideal weight.).  Liver Function Tests: Recent Labs  Lab 06/06/20 2217  AST 23  ALT 14  ALKPHOS 73  BILITOT 0.6  PROT 7.8  ALBUMIN 4.2    Urine analysis:    Component Value Date/Time   COLORURINE YELLOW 06/07/2020 0059   APPEARANCEUR CLEAR 06/07/2020 0059   LABSPEC 1.015 06/07/2020 0059   PHURINE 6.0 06/07/2020 0059   GLUCOSEU NEGATIVE 06/07/2020 0059   HGBUR NEGATIVE 06/07/2020 0059   BILIRUBINUR NEGATIVE 06/07/2020 0059   KETONESUR 5 (A) 06/07/2020 0059   PROTEINUR 100 (A) 06/07/2020 0059   UROBILINOGEN 0.2 04/07/2015 0339   NITRITE NEGATIVE 06/07/2020 0059   LEUKOCYTESUR MODERATE (A) 06/07/2020 0059    Radiological Exams on Admission: DG Chest 2 View  Result Date: 06/06/2020 CLINICAL DATA:  Weakness, malaise EXAM: CHEST - 2 VIEW COMPARISON:  10/09/2018 FINDINGS: Surgical clips are seen within the right hilum in surgical staple line is seen within the right suprahilar region likely related to right upper lobectomy. There is chronic collapse and consolidation of the left upper lobe with associated left-sided volume loss. No superimposed confluent pulmonary infiltrate. No pneumothorax or pleural effusion. Cardiac size within normal limits. The pulmonary vascularity is normal. No acute bone abnormality. IMPRESSION: Stable examination with chronic collapse of the left upper lobe and probable surgical changes of right upper lobectomy. No acute cardiopulmonary disease. Electronically Signed   By: Fidela Salisbury MD   On: 06/06/2020 22:29   CT HEAD WO CONTRAST  Result Date: 06/06/2020 CLINICAL DATA:  86 year old female with altered mental status. EXAM: CT HEAD WITHOUT CONTRAST TECHNIQUE: Contiguous axial images were obtained from the base of the skull through the vertex without intravenous contrast. COMPARISON:  None. FINDINGS: Brain: Moderate age-related atrophy and chronic microvascular ischemic changes. See there is no acute  intracranial hemorrhage. No mass effect or midline shift. No extra-axial fluid collection. Vascular: No hyperdense vessel or unexpected calcification. Skull: Normal. Negative for fracture or focal lesion. Sinuses/Orbits: No acute finding. Other: None IMPRESSION: 1. No acute intracranial pathology. 2. Moderate age-related atrophy and chronic microvascular ischemic changes. Electronically Signed   By: Anner Crete M.D.   On: 06/06/2020 22:36   EKG: Independently reviewed. EKG showed normal sinus rhythm without ST-T changes  at 70/min  Assessment/Plan Principal Problem:   Acute gastroenteritis Active Problems:   Chronic kidney disease (CKD), stage IV (severe) (HCC)   COPD (chronic obstructive pulmonary disease) with emphysema (HCC)   Essential hypertension, benign   Hypothyroidism   Adenocarcinoma, lung (HCC)   Essential hypertension   Intractable nausea vomiting likely due to acute gastroenteritis Acute metabolic encephalopathy Patient's reports possible food poisoning.  No other significant history reported. Continue supportive care with IV fluids, antiemetics.  Clear liquid diet advance as tolerated.  Acute encephalopathy is improving Work up grossly unrevealing with essentially normal ABG, ammonia level, no acute signs of infection.  Hypertension  Blood pressure stable.  Continue amlodipine, atenolol  Hypothyroidism Thyroid function appears to be within normal limits.  Continue levothyroxine.  Chronic kidney disease stage III Renal function is stable, with most recent creatinine being 1.4  History of lung adenocarcinoma Patient continue to follow-up routinely with pulmonology for surveillance of cancer recurrence   DVT prophylaxis: Subcutaneous Lovenox Code Status:        Full code Family Communication:  Daughter, April Keithly Disposition Plan:   Patient is from:  Home  Anticipated DC to:  Home  Anticipated DC date:  Next 24 to 48 hours  Anticipated DC  barriers: None  Consults called:  Not applicable Admission status:  Observation Severity of Illness: The appropriate patient status for this patient is OBSERVATION. Observation status is judged to be reasonable and necessary in order to provide the required intensity of service to ensure the patient's safety. The patient's presenting symptoms, physical exam findings, and initial radiographic and laboratory data in the context of their medical condition is felt to place them at decreased risk for further clinical deterioration. Furthermore, it is anticipated that the patient will be medically stable for discharge from the hospital within 2 midnights of admission. The following factors support the patient status of observation.   " The patient's presenting symptoms include, mild confusion, nausea vomiting " The physical exam findings include, mild abdominal pain " The initial radiographic and laboratory data are, unrevealing   Allie Dimmer MD [facility]  How to contact the [facility] Attending or Consulting provider [place] or covering provider during after hours [place], for this patient?   1. Check the care team in [facility] and look for a) attending/consulting [facility] provider listed and b) the [facility] team listed 2. Log into [email or web address] and use [place]'s universal password to access. If you do not have the password, please contact the hospital operator. 3. Locate the [facility] provider you are looking for under [facility] and page to a number that you can be directly reached. 4. If you still have difficulty reaching the provider, please page the [facility] (Director on Call) for the Hospitalists listed on amion for assistance.  06/07/2020, 2:50 AM

## 2020-06-07 NOTE — ED Notes (Signed)
This RN spoke with pharmacy tech regarding pt med rec. Pt family poor historian about pt medications. Pt family willing to go retrieve pt medication but wants to wait for dr to come see patient before leaving.

## 2020-06-07 NOTE — Progress Notes (Signed)
Pt not available for EEG; in MRI at this time.

## 2020-06-08 ENCOUNTER — Inpatient Hospital Stay (HOSPITAL_COMMUNITY): Payer: Medicare Other

## 2020-06-08 DIAGNOSIS — I6389 Other cerebral infarction: Secondary | ICD-10-CM

## 2020-06-08 DIAGNOSIS — I6340 Cerebral infarction due to embolism of unspecified cerebral artery: Secondary | ICD-10-CM | POA: Insufficient documentation

## 2020-06-08 DIAGNOSIS — R569 Unspecified convulsions: Secondary | ICD-10-CM

## 2020-06-08 DIAGNOSIS — I639 Cerebral infarction, unspecified: Secondary | ICD-10-CM

## 2020-06-08 DIAGNOSIS — I63432 Cerebral infarction due to embolism of left posterior cerebral artery: Principal | ICD-10-CM

## 2020-06-08 DIAGNOSIS — I63443 Cerebral infarction due to embolism of bilateral cerebellar arteries: Secondary | ICD-10-CM

## 2020-06-08 DIAGNOSIS — R4182 Altered mental status, unspecified: Secondary | ICD-10-CM

## 2020-06-08 LAB — CBC WITH DIFFERENTIAL/PLATELET
Abs Immature Granulocytes: 0.04 10*3/uL (ref 0.00–0.07)
Basophils Absolute: 0 10*3/uL (ref 0.0–0.1)
Basophils Relative: 0 %
Eosinophils Absolute: 0 10*3/uL (ref 0.0–0.5)
Eosinophils Relative: 0 %
HCT: 41.3 % (ref 36.0–46.0)
Hemoglobin: 13.1 g/dL (ref 12.0–15.0)
Immature Granulocytes: 0 %
Lymphocytes Relative: 17 %
Lymphs Abs: 1.9 10*3/uL (ref 0.7–4.0)
MCH: 32.3 pg (ref 26.0–34.0)
MCHC: 31.7 g/dL (ref 30.0–36.0)
MCV: 101.7 fL — ABNORMAL HIGH (ref 80.0–100.0)
Monocytes Absolute: 1.1 10*3/uL — ABNORMAL HIGH (ref 0.1–1.0)
Monocytes Relative: 10 %
Neutro Abs: 8.1 10*3/uL — ABNORMAL HIGH (ref 1.7–7.7)
Neutrophils Relative %: 73 %
Platelets: 217 10*3/uL (ref 150–400)
RBC: 4.06 MIL/uL (ref 3.87–5.11)
RDW: 12.2 % (ref 11.5–15.5)
WBC: 11.1 10*3/uL — ABNORMAL HIGH (ref 4.0–10.5)
nRBC: 0 % (ref 0.0–0.2)

## 2020-06-08 LAB — COMPREHENSIVE METABOLIC PANEL
ALT: 13 U/L (ref 0–44)
AST: 23 U/L (ref 15–41)
Albumin: 3.8 g/dL (ref 3.5–5.0)
Alkaline Phosphatase: 67 U/L (ref 38–126)
Anion gap: 9 (ref 5–15)
BUN: 18 mg/dL (ref 8–23)
CO2: 26 mmol/L (ref 22–32)
Calcium: 9.5 mg/dL (ref 8.9–10.3)
Chloride: 103 mmol/L (ref 98–111)
Creatinine, Ser: 1.18 mg/dL — ABNORMAL HIGH (ref 0.44–1.00)
GFR calc Af Amer: 47 mL/min — ABNORMAL LOW (ref >60)
GFR calc non Af Amer: 41 mL/min — ABNORMAL LOW (ref >60)
Glucose, Bld: 100 mg/dL — ABNORMAL HIGH (ref 70–99)
Potassium: 4.2 mmol/L (ref 3.5–5.1)
Sodium: 138 mmol/L (ref 135–145)
Total Bilirubin: 0.8 mg/dL (ref 0.3–1.2)
Total Protein: 7 g/dL (ref 6.5–8.1)

## 2020-06-08 LAB — ECHOCARDIOGRAM COMPLETE
Area-P 1/2: 4.15 cm2
S' Lateral: 2.7 cm

## 2020-06-08 LAB — MAGNESIUM: Magnesium: 2.2 mg/dL (ref 1.7–2.4)

## 2020-06-08 LAB — LIPID PANEL
Cholesterol: 141 mg/dL (ref 0–200)
HDL: 55 mg/dL (ref >40)
LDL Cholesterol: 70 mg/dL (ref 0–99)
Total CHOL/HDL Ratio: 2.6 RATIO
Triglycerides: 82 mg/dL (ref <150)
VLDL: 16 mg/dL (ref 0–40)

## 2020-06-08 LAB — HEMOGLOBIN A1C
Hgb A1c MFr Bld: 5.9 % — ABNORMAL HIGH (ref 4.8–5.6)
Mean Plasma Glucose: 122.63 mg/dL

## 2020-06-08 LAB — PHOSPHORUS: Phosphorus: 2.7 mg/dL (ref 2.5–4.6)

## 2020-06-08 MED ORDER — IOHEXOL 350 MG/ML SOLN
50.0000 mL | Freq: Once | INTRAVENOUS | Status: AC | PRN
  Administered 2020-06-08: 50 mL via INTRAVENOUS

## 2020-06-08 NOTE — Progress Notes (Signed)
Echocardiogram 2D Echocardiogram has been performed.  Valerie Young 06/08/2020, 2:06 PM

## 2020-06-08 NOTE — Evaluation (Signed)
Physical Therapy Evaluation Patient Details Name: Valerie Young MRN: [id number] DOB: 01-Sep-1929 Today's Date: 06/08/2020   History of Present Illness  86 y.o. female with medical history significant of hypertension, hyperlipidemia, chronic kidney disease stage III with baseline creatinine 1.4, hypothyroidism, history of lung adenocarcinoma who arrives to ED on 7/13 with confusion, n/v. MRI head revelas multiple ischemic CVAs involving L PCA and R MLF pathway; brain regions including L hippocampus, L occipital lobe, L dorsal thalamus, bilateral cerebellar, and R midbrain.  Clinical Impression   Pt presents with generalized weakness, R eye esotropia with inattention and lateral field cut, impaired sitting and standing balance with heavy L lateral leaning, AMS vs baseline, and decreased activity. Pt to benefit from acute PT to address deficits. Pt required mod assist for bed mobility, transfer to standing, and lateral steps towards HOB this day, pt with heavy L lateral leaning unable to correct without significant PT assist. PTA, pt was living alone and doing all daily tasks without assist, except groceries. Per pt's daughter, pt has been physically declining and has been less mobile over the past few weeks. PT recommending SNF level of care post-acutely to address mobility deficits and maximize pt functional recovery. PT to progress mobility as tolerated, and will continue to follow acutely.      Follow Up Recommendations SNF;Supervision/Assistance - 24 hour    Equipment Recommendations  None recommended by PT    Recommendations for Other Services       Precautions / Restrictions Precautions Precautions: Fall Restrictions Weight Bearing Restrictions: No      Mobility  Bed Mobility Overal bed mobility: Needs Assistance Bed Mobility: Rolling;Supine to Sit;Sit to Supine Rolling: Mod assist   Supine to sit: Min assist;HOB elevated Sit to supine: Mod assist;HOB elevated   General bed  mobility comments: Mod assist for rolling bilaterally for initiation of truncal and UE translation, for adjusting bed pad. Min assist for supine to sit for translation LEs to EOB and scooting to EOB with use of bed pads, mod assist for return to supine for LE lifting and pulling up in bed. Increased time and effort, suspect decreased processing and motor planning negatively impacting participation.  Transfers Overall transfer level: Needs assistance Equipment used: Rolling walker (2 wheeled);1 person hand held assist Transfers: Sit to/from Stand Sit to Stand: Mod assist         General transfer comment: Mod assist for power up, hip extension, righting posture to midline via tactile inputs at sternum and pelvis as pt with heavy L lateral leaning. Stand x2, first attempt with HHA and second with RW (requires PT to physically place hands on RW).  Ambulation/Gait Ambulation/Gait assistance: Mod assist Gait Distance (Feet): 2 Feet Assistive device: Rolling walker (2 wheeled) Gait Pattern/deviations: Step-to pattern;Decreased step length - right;Decreased step length - left;Trunk flexed Gait velocity: decr   General Gait Details: Lateral stepping towards HOB, mod assist for weight shifting to prepare for stepping, steadying, upright posture. Forward stepping not attempted given pt's cognitive status, significant L lateral leaning, poor tolerance for lateral steps.  Stairs            Wheelchair Mobility    Modified Rankin (Stroke Patients Only) Modified Rankin (Stroke Patients Only) Pre-Morbid Rankin Score: Slight disability Modified Rankin: Moderately severe disability     Balance Overall balance assessment: Needs assistance (no falls in the past year per pt's daughter April) Sitting-balance support: No upper extremity supported;Feet supported Sitting balance-Leahy Scale: Fair Sitting balance - Comments: able to maintain  sitting balance with UE propping, heavy lateral lean towards  L requiring PT assist to correct and pt unable to maintain midline when corrected Postural control: Left lateral lean Standing balance support: During functional activity;Single extremity supported Standing balance-Leahy Scale: Poor Standing balance comment: reliant on external support                             Pertinent Vitals/Pain Pain Assessment: No/denies pain    Home Living Family/patient expects to be discharged to:: Private residence Living Arrangements: Alone Available Help at Discharge: Family;Available PRN/intermittently Type of Home: House Home Access: Stairs to enter   [employer] of Steps: 4 (per April a curb only) Home Layout: One level Home Equipment: Environmental consultant - 2 wheels;Kasandra Knudsen - single point      Prior Function Level of Independence: Needs assistance   Gait / Transfers Assistance Needed: Spoke with daughter April on the phone - per April, pt ambulates with cane  ADL's / Homemaking Assistance Needed: pt states her aide helps her cook and eat, per daughter April this is not accurate, no aide but son and daughter Karna Christmas live close to pt if needed. Per April, pt was driving and doing all dressing/bathing/cooking/cleaning at home but bathing less frequently due to significant effort.        Hand Dominance   Dominant Hand: Right    Extremity/Trunk Assessment   Upper Extremity Assessment Upper Extremity Assessment: Defer to OT evaluation    Lower Extremity Assessment Lower Extremity Assessment: Generalized weakness;LLE deficits/detail;RLE deficits/detail RLE Sensation: WNL (light touch WFL) LLE Deficits / Details: able to perform AROM hip and knee flexion/extension as well as SLR, but appears effortful vs R LLE Sensation: WNL (light touch WFL)    Cervical / Trunk Assessment Cervical / Trunk Assessment: Normal  Communication   Communication: No difficulties  Cognition Arousal/Alertness: Awake/alert Behavior During Therapy: WFL for tasks  assessed/performed Overall Cognitive Status: Impaired/Different from baseline Area of Impairment: Orientation;Attention;Memory;Following commands;Safety/judgement;Problem solving;Awareness                 Orientation Level: Disoriented to;Person;Place;Time;Situation Current Attention Level: Sustained Memory: Decreased short-term memory Following Commands: Follows one step commands with increased time;Follows one step commands inconsistently Safety/Judgement: Decreased awareness of deficits;Decreased awareness of safety Awareness: Intellectual Problem Solving: Slow processing;Decreased initiation;Difficulty sequencing;Requires verbal cues;Requires tactile cues General Comments: Pt cannot state name, states she is 86 years old, and is in a "nice place in [place]". PT oriented pt to hospital, name, and age. Pt requires repeated frequent cuing for mobility and safety, and is very distractible.      General Comments General comments (skin integrity, edema, etc.): R eye visual field testing: R lower quadrandinopia (vs R hemianopia, pt correctly gives numbers placed in R upper quadrant this day). Pt with INO, cannot cross midline R eye. R inattention.    Exercises     Assessment/Plan    PT Assessment Patient needs continued PT services  PT Problem List Decreased strength;Decreased mobility;Decreased safety awareness;Decreased activity tolerance;Decreased cognition;Decreased balance;Decreased knowledge of use of DME;Decreased coordination       PT Treatment Interventions DME instruction;Therapeutic activities;Gait training;Therapeutic exercise;Patient/family education;Balance training;Functional mobility training;Neuromuscular re-education    PT Goals (Current goals can be found in the Care Plan section)  Acute Rehab PT Goals Patient Stated Goal: none explicitly stated PT Goal Formulation: With patient/family Time For Goal Achievement: 06/22/20 Potential to Achieve Goals: Good     Frequency Min 3X/week   Barriers  to discharge        Co-evaluation               AM-PAC PT "6 Clicks" Mobility  Outcome Measure Help needed turning from your back to your side while in a flat bed without using bedrails?: A Lot Help needed moving from lying on your back to sitting on the side of a flat bed without using bedrails?: A Lot Help needed moving to and from a bed to a chair (including a wheelchair)?: A Lot Help needed standing up from a chair using your arms (e.g., wheelchair or bedside chair)?: A Lot Help needed to walk in hospital room?: Total Help needed climbing 3-5 steps with a railing? : Total 6 Click Score: 10    End of Session Equipment Utilized During Treatment: Gait belt Activity Tolerance: Patient limited by fatigue Patient left: in bed;with bed alarm set;with call bell/phone within reach;with nursing/sitter in room Nurse Communication: Mobility status PT Visit Diagnosis: Other symptoms and signs involving the nervous system (R29.898)    Time: 5176-1607 PT Time Calculation (min) (ACUTE ONLY): 29 min   Charges:   PT Evaluation $PT Eval Low Complexity: 1 Low PT Treatments $Therapeutic Activity: 8-22 mins        Maddux First E, PT Acute Rehabilitation Services Pager [phone]  Office [phone]   Darry Kelnhofer D Elonda Husky 06/08/2020, 9:46 AM

## 2020-06-08 NOTE — Procedures (Signed)
Patient Name: Valerie Young  MRN: [id number]  Epilepsy Attending: Lora Havens  Referring Physician/Provider: Dr Baird Kay Date: 06/08/2020 Duration: 24.35 mins  Patient history: 86yo F with abnormal eye movements and left eye was deviated to the left with nystagmus. EEG to evaluate for seizure.  Level of alertness: Awake, drowsy  AEDs during EEG study: None  Technical aspects: This EEG study was done with scalp electrodes positioned according to the 10-20 International system of electrode placement. Electrical activity was acquired at a sampling rate of 500Hz  and reviewed with a high frequency filter of 70Hz  and a low frequency filter of 1Hz . EEG data were recorded continuously and digitally stored.   Description: The posterior dominant rhythm consists of 8 Hz activity of moderate voltage (25-35 uV) seen predominantly in posterior head regions, symmetric and reactive to eye opening and eye closing. Drowsiness was characterized by attenuation of the posterior background rhythm.  EEG showed intermittent left temporal 3 to 6 Hz theta-delta slowing.  Hyperventilation and photic stimulation were not performed.     ABNORMALITY -Intermittent slow, left temporal region  IMPRESSION: This study is suggestive of non specific cortical dysfunction in left temporal region.  No seizures or epileptiform discharges were seen throughout the recording.  Valerie Young

## 2020-06-08 NOTE — Progress Notes (Signed)
Unable to place IV.  Consult sent to IV team.  Patient requires a 20 G PIV for CT scan.

## 2020-06-08 NOTE — Progress Notes (Signed)
Daily medications given this afternoon once patient was no longer NPO.

## 2020-06-08 NOTE — Evaluation (Signed)
Clinical/Bedside Swallow Evaluation Patient Details  Name: Valerie Young MRN: [id number] Date of Birth: 1929-08-26  Today's Date: 06/08/2020 Time: SLP Start Time (ACUTE ONLY): 1513 SLP Stop Time (ACUTE ONLY): 1537 SLP Time Calculation (min) (ACUTE ONLY): 24 min  Past Medical History:  Past Medical History:  Diagnosis Date  . 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 Cancer ([place])    right lung ca - 30 years ago / left lung - 11/2012  . Cancer ([place])    Left lung  . CKD (chronic kidney disease)   . COPD (chronic obstructive pulmonary disease) ([place])   . DJD (degenerative joint disease)    knees  . H/O: lung cancer 1982   Right; treated with resection  . Hypertension   . Hypothyroidism   . PND (post-nasal drip)    Chronic   Past Surgical History:  Past Surgical History:  Procedure Laterality Date  . ABDOMINAL HYSTERECTOMY     PARTIAL  . CATARACT EXTRACTION, BILATERAL    . JOINT REPLACEMENT     RIGHT TOTAL KNEE  . RADIATION OF LEFT LUNG    . right lung cancer treated with resection  1982  . right thigh postoperative hematoma  02-2008  . RIGHT UPPER LOBE REMOVED    . THYROID SURGERY    . THYROIDECTOMY    . TOTAL KNEE ARTHROPLASTY     Right  . VESICOVAGINAL FISTULA CLOSURE W/ TAH    . VIDEO BRONCHOSCOPY  12/08/2012   Procedure: VIDEO BRONCHOSCOPY WITH FLUORO;  Surgeon: Kathee Delton, MD;  Location: [facility];  Service: Cardiopulmonary;  Laterality: Bilateral;   HPI:  86 y.o. female with medical history significant of hypertension, hyperlipidemia, chronic kidney disease stage III with baseline creatinine 1.4, hypothyroidism, history of lung adenocarcinoma who arrives to ED on 7/13 with confusion, n/v. MRI head revelas multiple ischemic CVAs involving L PCA and R MLF pathway; brain regions including L hippocampus, L occipital lobe, L dorsal thalamus, bilateral cerebellar, and R midbrain. CXR Stable examination with chronic collapse of the left upper lobe and  probable surgical changes of right upper lobectomy. No acute cardiopulmonary disease   Assessment / Plan / Recommendation Clinical Impression  Pt's swallow integirty appeared functional from subjective obsevation. Moderately prolonged coughing episode due to suspected inhalation of applesauce with phonation immediately after swallow. Subsequent swallows were tolerated well of thin with straw and cracker. Newly diagnosed strokes increase risk. Will order regular texture, thin liquids, educated pt to refrain from verbalizing during meals, pills whole in puree. ST will continue to see for safety and need for instrumental asessment.         SLP Visit Diagnosis: Dysphagia, unspecified (R13.10)    Aspiration Risk  Mild aspiration risk    Diet Recommendation Regular;Thin liquid   Liquid Administration via: Cup;Straw Medication Administration: Whole meds with puree Supervision: Staff to assist with self feeding Compensations: Slow rate;Small sips/bites;Minimize environmental distractions;Other (Comment) (refrain from speaking during meals) Postural Changes: Seated upright at 90 degrees    Other  Recommendations Oral Care Recommendations: Oral care BID   Follow up Recommendations None      Frequency and Duration min 2x/week  2 weeks       Prognosis Prognosis for Safe Diet Advancement: Good      Swallow Study   General HPI: 86 y.o. female with medical history significant of hypertension, hyperlipidemia, chronic kidney disease stage III with baseline creatinine 1.4, hypothyroidism, history of lung adenocarcinoma who arrives to ED on 7/13  with confusion, n/v. MRI head revelas multiple ischemic CVAs involving L PCA and R MLF pathway; brain regions including L hippocampus, L occipital lobe, L dorsal thalamus, bilateral cerebellar, and R midbrain. 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 No acute cardiopulmonary  disease Type of Study: Bedside Swallow Evaluation Previous Swallow Assessment:  (none) Diet Prior to this Study: NPO Temperature Spikes Noted: No Respiratory Status: Room air History of Recent Intubation: No Behavior/Cognition: Alert;Cooperative;Pleasant mood;Requires cueing Oral Cavity Assessment: Within Functional Limits Oral Care Completed by SLP: No Oral Cavity - Dentition: Dentures, bottom;Dentures, top Vision: Impaired for self-feeding (needs assist) Self-Feeding Abilities: Needs assist Patient Positioning: Upright in bed Baseline Vocal Quality: Normal Volitional Cough: Strong Volitional Swallow: Able to elicit    Oral/Motor/Sensory Function Overall Oral Motor/Sensory Function: Within functional limits   Ice Chips Ice chips: Not tested   Thin Liquid Thin Liquid: Within functional limits Presentation: Cup;Straw    Nectar Thick Nectar Thick Liquid: Not tested   Honey Thick Honey Thick Liquid: Not tested   Puree Puree: Impaired Presentation: Spoon Pharyngeal Phase Impairments: Cough - Immediate;Cough - Delayed;Wet Vocal Quality   Solid     Solid: Impaired Pharyngeal Phase Impairments: Cough - Delayed      Houston Siren 06/08/2020,4:12 PM   Orbie Pyo Colvin Caroli.Ed Risk analyst [phone] Office [phone]

## 2020-06-08 NOTE — Progress Notes (Signed)
STROKE TEAM PROGRESS NOTE   INTERVAL HISTORY Daughter at bedside. Pt awake alert, stated that she feels better. No more N/V, or abdominal pain. Still has diplopia on left gaze. MRI showed b/l cerebellar, left PCA and punctate pontine infarcts, embolic pattern. She has hx of lung cancer and last f/u with Dr. Lamonte Sakai was in 10/2018. At that time, Dr. Lamonte Sakai had concern of potential lung cancer recurrence but patient declined further work up. For the last 1.5 years or so, pt has been doing well. Although likely she lost some weight for the last 2 months, but pt and daughter felt that pt was not eating well as she was not willing to cook for herself. Daughter lives in [place].   Vitals:   06/07/20 1731 06/07/20 1800 06/07/20 2202 06/08/20 0746  BP: (!) 161/67 (!) 164/68 (!) 166/65 (!) 151/64  Pulse: 76 77 68 72  Resp:  (!) 22 20 14   Temp:  99.2 F (37.3 C) 98.8 F (37.1 C) 98.4 F (36.9 C)  TempSrc:  Oral Oral   SpO2: 94% 96% 95% 96%   CBC:  Recent Labs  Lab 06/06/20 2217 06/06/20 2221 06/07/20 0414 06/08/20 0251  WBC 17.7*   < > 12.2* 11.1*  NEUTROABS 13.8*  --   --  8.1*  HGB 13.8   < > 13.1 13.1  HCT 42.7   < > 41.5 41.3  MCV 101.7*   < > 101.5* 101.7*  PLT 250   < > 221 217   < > = values in this interval not displayed.   Basic Metabolic Panel:  Recent Labs  Lab 06/07/20 0414 06/08/20 0251  NA 140 138  K 4.3 4.2  CL 104 103  CO2 24 26  GLUCOSE 158* 100*  BUN 27* 18  CREATININE 1.29* 1.18*  CALCIUM 9.2 9.5  MG  --  2.2  PHOS  --  2.7   Lipid Panel:  Recent Labs  Lab 06/08/20 0251  CHOL 141  TRIG 82  HDL 55  CHOLHDL 2.6  VLDL 16  LDLCALC 70   HgbA1c:  Recent Labs  Lab 06/08/20 0251  HGBA1C 5.9*   Urine Drug Screen: No results for input(s): LABOPIA, COCAINSCRNUR, LABBENZ, AMPHETMU, THCU, LABBARB in the last 168 hours.  Alcohol Level  Recent Labs  Lab 06/06/20 2217  ETH <10    IMAGING past 24 hours MR ANGIO HEAD WO CONTRAST  Addendum Date: 06/07/2020    ADDENDUM REPORT: 06/07/2020 16:56 ADDENDUM: These results were called by telephone at the time of interpretation on 06/07/2020 at 4:56 pm to provider Dr. Alfredia Ferguson , who verbally acknowledged these results. Electronically Signed   By: Kellie Simmering DO   On: 06/07/2020 16:56   Result Date: 06/07/2020 CLINICAL DATA:  Stroke, follow-up. EXAM: MRA HEAD WITHOUT CONTRAST TECHNIQUE: Angiographic images of the Circle of Willis were obtained using MRA technique without intravenous contrast. COMPARISON:  Brain MRI performed earlier the same day 06/07/2020, FINDINGS: The intracranial internal carotid arteries are patent without significant stenosis. The M1 middle cerebral arteries are patent without significant stenosis. No M2 proximal branch occlusion or high-grade proximal stenosis is identified. The anterior cerebral arteries are patent. No intracranial aneurysm is identified. The intracranial vertebral arteries are patent without significant stenosis. The left PICA is poorly delineated proximally. The basilar artery is patent without significant stenosis. The right posterior cerebral artery is patent without significant proximal stenosis. There is occlusion of the left posterior cerebral artery at the level of the distal P2  segment. A right posterior communicating artery is present. The left posterior communicating artery is hypoplastic or absent. 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 The proximal left PICA is poorly delineated. This may be due to small vessel size, high-grade stenosis or partial occlusion. No anterior circulation large vessel occlusion or proximal high-grade arterial stenosis. Electronically Signed: By: Kellie Simmering DO On: 06/07/2020 16:34   MR BRAIN WO CONTRAST  Result Date: 06/07/2020 CLINICAL DATA:  Encephalopathy. EXAM: MRI HEAD WITHOUT CONTRAST TECHNIQUE: Multiplanar, multiecho pulse sequences of the brain and surrounding structures were  obtained without intravenous contrast. COMPARISON:  06/06/2020 head CT. FINDINGS: Brain: Multifocal restricted diffusion involving the medial left occipital lobe with extension into the left hippocampal tail and body. Restricted diffusion involving the bilateral cerebellum with tiny foci of restricted diffusion involving the left superior cerebellar peduncle, dorsal left thalamus (5:74) and dorsomedial right mid brain (5:69, 67). All of the aforementioned acute infarcts demonstrate correlative T2/FLAIR hyperintense signal and edema. Bilateral hippocampal sulcal remnant cysts. Moderate cerebral atrophy with ex vacuo dilatation. No midline shift, mass lesion or extra-axial fluid collection. Background scattered and confluent T2/FLAIR supratentorial white matter foci reflect chronic microvascular ischemic changes. Remote right pontine microhemorrhage. Vascular: Normal flow voids. Skull and upper cervical spine: Normal marrow signal. Sinuses/Orbits: Normal orbits. Sequela of bilateral lens replacement. Clear paranasal sinuses. No mastoid effusion. Other: None. IMPRESSION: Multifocal acute infarcts involving the left occipital lobe, left hippocampus, dorsal left thalamus, bilateral cerebellum, dorsomedial right mid brain and left superior cerebellar peduncle. Moderate cerebral atrophy. Moderate to severe chronic microvascular ischemic changes. These results were called by telephone at the time of interpretation on 06/07/2020 at 4:04 pm to provider [facility] , who verbally acknowledged these results. Electronically Signed   By: Primitivo Gauze M.D.   On: 06/07/2020 16:11    PHYSICAL EXAM  Temp:  [98.4 F (36.9 C)-99.2 F (37.3 C)] 98.4 F (36.9 C) (07/15 0746) Pulse Rate:  [68-77] 72 (07/15 0746) Resp:  [14-22] 14 (07/15 0746) BP: (151-166)/(64-68) 151/64 (07/15 0746) SpO2:  [95 %-96 %] 96 % (07/15 0746)  General - cachectic appearance, well developed, in no apparent distress.  Ophthalmologic - fundi  not visualized due to noncooperation.  Cardiovascular - Regular rhythm and rate.  Mental Status -  Level of arousal and orientation to place, age and person were intact, however, not orientated to time. Language including expression, naming, repetition, comprehension was assessed and found intact.  Cranial Nerves II - XII - II - right hemianopia. III, IV, VI - Extraocular movements exam showed right INO. V - Facial sensation intact bilaterally. VII - Facial movement intact bilaterally. VIII - Hard of hearing & vestibular intact bilaterally. Unsustained nystagmus of left eye on left gaze X - Palate elevates symmetrically. XI - Chin turning & shoulder shrug intact bilaterally. XII - Tongue protrusion intact.  Motor Strength - The patient's strength was symmetrical in all extremities and pronator drift was absent.  Bulk was normal and fasciculations were absent.   Motor Tone - Muscle tone was assessed at the neck and appendages and was normal.  Reflexes - The patient's reflexes were symmetrical in all extremities and she had no pathological reflexes.  Sensory - Light touch, temperature/pinprick were assessed and were symmetrical.    Coordination - The patient had grossly normal movements in the right hand and feet with no ataxia or dysmetria. However, left FTN mild dysmetria.  Tremor was absent.  Gait  and Station - deferred.   ASSESSMENT/PLAN Valerie Young is a 86 y.o. female with history of hypertension, lung cancer, blood clot associated with vein wall inflammation presenting with double vision, L eye nystagmus w/ abduction, nausea, vomiting and epigastric pain.   Stroke: B posterior circulation infarcts in setting of L P2 occlusion,  Embolic secondary to unknown source, suspicious for AF vs. Hypercoagulable state from advanced malignancy  CT head 7/13 No acute abnormality. Small vessel disease. Atrophy.     MRI 7/14  Multifocal L occipital, L hippocampus, dorsal L thalamic,  B cerebellar, dorsomedial R mid brain , L superior cerebellar peduncle infarcts. Moderate atrophy. Small vessel disease.   MRA  L PCA occlusion at P2. L PICA poorly seen  CTA neck - left PICA not visualized  2D Echo pending  LE venous doppler no DVT  EEG no seizure  If all work up neg, will consider loop recorder to rule out afib  LDL 70  HgbA1c 5.9  VTE prophylaxis - Lovenox 30 mg sq daily   No antithrombotic prior to admission, now on ASA 325mg .    Therapy recommendations:  SNF  Disposition:  pending   Hx of lung cancer  R lung cancer s/p lobectomy in 03/1980  L lung cancer in 2014 S/p LUL SBRT -> scarred  LLL cavitary lesion unchanged in 2019  Bilateral basilar nodules slightly larger in 2019 but pt declined further biopsy  Last follow up with Dr. Lamonte Sakai 10/2018  Will do pan CT to rule out advance malignancy   Hx of PE/DVT  Diagnosed in 2014  Was on coumadin 2014-2015  Currently not on [facility]  Not sure if PE/DVT was related to her left lung cancer at that time  This admissio nLE venous doppler no DVT  Will do CTA chest to rule out PE  Hypertensive Urgency  Elevated on admission 169/122  Stable  On amlodipine, atenolol . Permissive hypertension (OK if < 220/120) but gradually normalize in 5-7 days . Long-term BP goal normotensive  Hyperlipidemia  Home meds:   pravachol 40, resumed in hospital  LDL 70, goal < 70  Will not do more intensive statin than ordered given advanced age and LDL just at goal  Continue statin at discharge  CKD stage IIIa   Cre 1.29-1.18  BMP monitoring   Other Stroke Risk Factors  Advanced age  Former Cigarette smoker, quit 17 yrs ago  Family hx stroke (mother at age 74)  Other Active Problems  acute gastroenteritis  COPD  DJD  Hypothyroidism   Hospital day # 1  Rosalin Hawking, MD PhD Stroke Neurology 06/08/2020 6:03 PM    To contact Stroke Continuity provider, please refer to [email or web address] After  hours, contact General Neurology

## 2020-06-08 NOTE — Progress Notes (Signed)
Lower extremity venous been completed.   Preliminary results in CV Proc.   Abram Sander 06/08/2020 1:44 PM

## 2020-06-08 NOTE — Progress Notes (Signed)
PROGRESS NOTE    Valerie Young  IDP:[id number] DOB: 1929-10-06 DOA: 06/06/2020 PCP: Collene Gobble, MD   Brief Narrative:  The patient is a 86 year old occasion female with a past medical history significant for but not limited to hypertension, hyperlipidemia, chronic kidney stage IIIb with a baseline creatinine of 1.4 hypothyroidism, history of lung adenocarcinoma as well as other comorbidities who came to the hospital for further evaluation of her confusion as well as having persistent nausea vomiting.  Patient states that she was in her normal usual state of health and remembers eating a small piece of sausage in the afternoon and subsequently felt ill.  She was seen by her neighbor sitting on the porch very confused and acting very different.  She stated that she felt very ill and unable to state what happened to her.  She reported nausea with vomiting and epigastric abdominal pain she is brought to the emergency room for her persisting symptoms.  Initially in the ED she was given IV fluid resuscitation and confusion improved however again she continued to have some nausea vomiting.  On admission she had a CBC showing a WBC of 17.7 which is now improved to 12.2.  There is no clear source of infection noted and she had a normal ammonia level.  TSH was also within normal limits at 4.041, however T4 was slightly elevated.  Urinalysis was not suggestive of infection but did show five ketones, moderate leukocytes, negative nitrites, few bacteria, 0-5 RBCs per high-power field, 0-5 squamous epithelial cells and 11-20 WBCs.  Urine culture still pending. She had a head CT without contrast which showed no acute intracranial pathology and showed moderate age-related atrophy and chronic microvascular ischemic changes.   **Interim History Yesterday afternoon missed with abduction and further work-up was initiated and she had an MRI and an MRI of the brain and neck and she is found to have an acute ischemic CVA  with left PCA-P2 segment occlusion.  Further work-up was initiated and she had a CTA of the head and neck done in a transthoracic echo was ordered and was done and still pending read.  She was started on aspirin 325 mg p.o. daily and given permissive hypertension.  Hemoglobin A1c and lipid panel ordered and she is placed on [facility] unit and she is n.p.o. until she passes full screen.  PT OT recommending SNF for placement.  She also underwent an EEG with nonspecific cortical dysfunction in the temporal region but no seizures or epileptiform activity was seen throughout the recording.  Because allergy feels that this is embolic so she underwent a lower extremity duplex which was negative for DVT.  I discussed the case with neurology Dr. Erlinda Hong who is considering a loop recorder but I was also recommending likely going to get a CT of the chest abdomen pelvis tomorrow given that she is got a history of lung cancer with unclear cause of her embolic CVA.  Assessment & Plan:   Principal Problem:   Acute gastroenteritis Active Problems:   Chronic kidney disease (CKD), stage IV (severe) (HCC)   COPD (chronic obstructive pulmonary disease) with emphysema (HCC)   Essential hypertension, benign   Hypothyroidism   Adenocarcinoma, lung (HCC)   Essential hypertension   Gastroenteritis   Cerebral embolism with cerebral infarction  Acute CVA -Started having difficulty with her left eye vision and had some deviation yesterday afternoon -Head CT Scan showed "No acute intracranial pathology. Moderate age-related atrophy and chronic microvascular ischemic changes." -CTA Neck showed "  Atherosclerotic disease of the carotid bifurcation bilaterally without significant carotid stenosis. No significant vertebral artery stenosis. Mild stenosis origin of left vertebral artery. Right PICA patent. Left PICA not visualized. Bilateral AICA patent. Post radiation changes left upper lobe. Aortic Atherosclerosis."  -Neurology consulted  and appreciate their recommendations  -Stroke team following now -EEG also done and as below which showed nonspecific cortical dysfunction in temporal region but no seizures or epileptiform activity seen throughout the recording. -PT OT and SLP to evaluate and treat and SLP recommends regular diet with thin liquids and PT OT recommending SNF Continue neurochecks per protocol  -continue telemetry monitoring -Check hemoglobin A1c and was 5.9 -Patient's panel showed a total cholesterol/HDL ratio 2.6, cholesterol level of 141, HDL level 55, LDL of 70, triglycerides of 82, VLDL 16 -Started on aspirin 325 by neurology and will also continue her pravastatin will defer to neurology to change to atorvastatin For further care per neurology and echocardiogram is still pending and Dr Erlinda Hong considering implanting a loop recorder given her unclear etiology of her embolic CVA  Intractable nausea vomiting likely due to acute gastroenteritis, improving -Patient's reports possible food poisoning.No other significant history reported. -WBC is trending down and went from 17.7 -> 12.2 -IV fluid hydration is now stopped -Continue with Antiemetics with p.o./IV Zofran every 6 hours as needed for nausea; she is given a dose of IV Zofran as well as a dose of IV Compazine in the ED -Clear liquid diet advance as tolerated and she is now on a regular diet and will need to monitor carefully as she was nauseous this morning  Acute metabolic encephalopathy likely from above, Improved -Acute encephalopathy was improving and head CT was negative as above however she was confused today and will continue with delirium precautions -Work upgrossly unrevealing with essentially normal ABG, ammonia level,no acute signs of infection. -Continue with pain control with p.o. Norco/Vicodin and IV morphine -We will obtain a PT OT evaluation to determine a safe discharge disposition for this patient and they are recommending SNF -Continue  to monitor carefully  Hypertension  -Blood pressure stable. -Continued Amlodipine 5 mg po Daily and Atenolol 50 mg po Daily  but likely will allow for permissive hypertension and will stop amlodipine  Hypothyroidism -Thyroid function appears to be within normal limits at 4.041 -Free T4 was slightly elevated at 1.19 -Continue Levothyroxine 50 mcg po Daily   Hyperlipidemia -Lipid panel as above -Continue with Pravastatin 40 m p.o. daily defer to neurology to change to atorvastatin 80 mg p.o. nightly but they feel that they will not do more intensive statin given her advanced age and LDL being at goal  Chronic Kidney Disease Stage IIIa,  Metabolic Acidosis -Renal function is stable,with most recent creatinine being 1.4 -BUN/creatinine went from 34/1.29 on admission and trended down and today it is 18/1.18 -Patient CO2 on admission was 21, chloride level was 104, and anion gap was 14; now CO2 is 26, anion gap is 9 and chloride level is 103  -avoid nephrotoxic medications, contrast dyes, hypotension and renally adjust medications  -Repeat CMP in a.m.  History of Lung Adenocarcinoma -Patient continue to follow-up routinely with pulmonology for surveillance of cancer recurrence -After discussion with neurology will obtain a pan scan of her tomorrow and obtain a CT scan of the chest abdomen and pelvis to look for potential causes for her embolic stroke and given her hypercoagulability given history of lung cancer  Acquired Thrombophilia -Has a history of a PE and DVT in  the past and has a history of lung cancer -Previously was anticoagulated with Coumadin but no longer taking the Coumadin -She had further work-up in her lower extremity DVT study was negative for DVTs currently -Will discuss with Neurology about obtaining a CTA PE Study tomorrow possibly  DVT prophylaxis: Enoxaparin 30 mg sq q24h Code Status: FULL CODE  Family Communication: No family present at bedside but called  daughter to update her Disposition Plan: SNF when cleared by Neurology  Status is: Inpatient  Remains inpatient appropriate because:Unsafe d/c plan, IV treatments appropriate due to intensity of illness or inability to take PO and Inpatient level of care appropriate due to severity of illness   Dispo: The patient is from: Home              Anticipated d/c is to: SNF              Anticipated d/c date is: 2 days              Patient currently is not medically stable to d/c.  Consultants:  Neurology   Procedures:  ECHOCARDIOGRAM; done and pending Read  LE VENOUS DUPLEX BILATERAL:  - No evidence of deep vein thrombosis seen in the lower extremities,  bilaterally.  -No evidence of popliteal cyst, bilaterally.   EEG Description: The posterior dominant rhythm consists of 8 Hz activity of moderate voltage (25-35 uV) seen predominantly in posterior head regions, symmetric and reactive to eye opening and eye closing. Drowsiness was characterized by attenuation of the posterior background rhythm.  EEG showed intermittent left temporal 3 to 6 Hz theta-delta slowing.  Hyperventilation and photic stimulation were not performed.     ABNORMALITY -Intermittent slow, left temporal region  IMPRESSION: This study is suggestive of non specific cortical dysfunction in left temporal region.  No seizures or epileptiform discharges were seen throughout the recording.  Antimicrobials:  Anti-infectives (From admission, onward)   None     Subjective: Seen and examined at bedside she was confused this morning and thought she was in her house.  No chest pain but was nauseous.  No lightheadedness or dizziness.  No other concerns or complaints at this time but felt okay.  Objective: Vitals:   06/07/20 1731 06/07/20 1800 06/07/20 2202 06/08/20 0746  BP: (!) 161/67 (!) 164/68 (!) 166/65 (!) 151/64  Pulse: 76 77 68 72  Resp:  (!) 22 20 14   Temp:  99.2 F (37.3 C) 98.8 F (37.1 C) 98.4 F (36.9 C)    TempSrc:  Oral Oral   SpO2: 94% 96% 95% 96%    Intake/Output Summary (Last 24 hours) at 06/08/2020 1634 Last data filed at 06/08/2020 0750 Gross per 24 hour  Intake --  Output 1000 ml  Net -1000 ml   There were no vitals filed for this visit.  Examination: Physical Exam:  Constitutional: Thin elderly Caucasian female currently in, NAD and appears calm and comfortable Eyes: Lids and conjunctivae normal, sclerae anicteric; left eye is deviated slightly ENMT: External Ears, Nose appear normal.  Hard of hearing Neck: Appears normal, supple, no cervical masses, normal ROM, no appreciable thyromegaly; no JVD Respiratory: Diminished to auscultation bilaterally, no wheezing, rales, rhonchi or crackles. Normal respiratory effort and patient is not tachypenic. No accessory muscle use.  Cardiovascular: RRR, no murmurs / rubs / gallops. S1 and S2 auscultated.  No appreciable lower extremity edema Abdomen: Soft, non-tender, non-distended. Bowel sounds positive.  GU: Deferred. Musculoskeletal: No clubbing / cyanosis of digits/nails. No joint deformity  upper and lower extremities.  Skin: No rashes, lesions, ulcers on limited skin evaluation. No induration; Warm and dry.  Neurologic: CN 2-12 grossly intact with no focal deficits. Romberg sign cerebellar reflexes not assessed.  Psychiatric: Impaired judgment and insight. Alert and oriented x 1. Normal mood and appropriate affect.   Data Reviewed: I have personally reviewed following labs and imaging studies  CBC: Recent Labs  Lab 06/06/20 2217 06/06/20 2221 06/07/20 0414 06/08/20 0251  WBC 17.7*  --  12.2* 11.1*  NEUTROABS 13.8*  --   --  8.1*  HGB 13.8 14.3 13.1 13.1  HCT 42.7 42.0 41.5 41.3  MCV 101.7*  --  101.5* 101.7*  PLT 250  --  221 960   Basic Metabolic Panel: Recent Labs  Lab 06/06/20 2217 06/06/20 2221 06/07/20 0414 06/08/20 0251  NA 139 140 140 138  K 3.9 4.0 4.3 4.2  CL 104  --  104 103  CO2 21*  --  24 26  GLUCOSE  174*  --  158* 100*  BUN 34*  --  27* 18  CREATININE 1.29*  --  1.29* 1.18*  CALCIUM 9.9  --  9.2 9.5  MG  --   --   --  2.2  PHOS  --   --   --  2.7   GFR: CrCl cannot be calculated (Unknown ideal weight.). Liver Function Tests: Recent Labs  Lab 06/06/20 2217 06/08/20 0251  AST 23 23  ALT 14 13  ALKPHOS 73 67  BILITOT 0.6 0.8  PROT 7.8 7.0  ALBUMIN 4.2 3.8   No results for input(s): LIPASE, AMYLASE in the last 168 hours. Recent Labs  Lab 06/06/20 2217  AMMONIA 17   Coagulation Profile: No results for input(s): INR, PROTIME in the last 168 hours. Cardiac Enzymes: No results for input(s): CKTOTAL, CKMB, CKMBINDEX, TROPONINI in the last 168 hours. BNP (last 3 results) No results for input(s): PROBNP in the last 8760 hours. HbA1C: Recent Labs    06/08/20 0251  HGBA1C 5.9*   CBG: Recent Labs  Lab 06/06/20 2214  GLUCAP 173*   Lipid Profile: Recent Labs    06/08/20 0251  CHOL 141  HDL 55  LDLCALC 70  TRIG 82  CHOLHDL 2.6   Thyroid Function Tests: Recent Labs    06/06/20 2217  TSH 4.041  FREET4 1.19*   Anemia Panel: No results for input(s): VITAMINB12, FOLATE, FERRITIN, TIBC, IRON, RETICCTPCT in the last 72 hours. Sepsis Labs: Recent Labs  Lab 06/06/20 2217 06/07/20 0001  LATICACIDVEN 1.8 1.3    Recent Results (from the past 240 hour(s))  Urine culture     Status: Abnormal (Preliminary result)   Collection Time: 06/06/20  7:05 PM   Specimen: Urine, Random  Result Value Ref Range Status   Specimen Description URINE, RANDOM  Final   Special Requests NONE  Final   Culture (A)  Final    >=100,000 COLONIES/mL KLEBSIELLA PNEUMONIAE SUSCEPTIBILITIES TO FOLLOW Performed at [facility], [place] [address] [place], [place] [address]    Report Status PENDING  Incomplete  SARS Coronavirus 2 by RT PCR (hospital order, performed in [facility] hospital lab) Nasopharyngeal Nasopharyngeal Swab     Status: None   Collection Time: 06/07/20  2:22 AM    Specimen: Nasopharyngeal Swab  Result Value Ref Range Status   SARS Coronavirus 2 NEGATIVE NEGATIVE Final    Comment: (NOTE) SARS-CoV-2 target nucleic acids are NOT DETECTED.  The SARS-CoV-2 RNA is generally detectable  in upper and lower respiratory specimens during the acute phase of infection. The lowest concentration of SARS-CoV-2 viral copies this assay can detect is 250 copies / mL. A negative result does not preclude SARS-CoV-2 infection and should not be used as the sole basis for treatment or other patient management decisions.  A negative result may occur with improper specimen collection / handling, submission of specimen other than nasopharyngeal swab, presence of viral mutation(s) within the areas targeted by this assay, and inadequate number of viral copies (<250 copies / mL). A negative result must be combined with clinical observations, patient history, and epidemiological information.  Fact Sheet for Patients:   StrictlyIdeas.no  Fact Sheet for Healthcare Providers: BankingDealers.co.za  This test is not yet approved or  cleared by the [place] FDA and has been authorized for detection and/or diagnosis of SARS-CoV-2 by FDA under an Emergency Use Authorization (EUA).  This EUA will remain in effect (meaning this test can be used) for the duration of the COVID-19 declaration under Section 564(b)(1) of the Act, 21 U.S.C. section 360bbb-3(b)(1), unless the authorization is terminated or revoked sooner.  Performed at [facility], [place] [address], [place], [place] [address]     RN Pressure Injury Documentation:     Estimated body mass index is 21.22 kg/m as calculated from the following:   Height as of 11/12/18: 5\' 4"  (1.626 m).   Weight as of 11/12/18: 56.1 kg.  Malnutrition Type:      Malnutrition Characteristics:      Nutrition Interventions:    Radiology Studies: EEG  Result Date:  06/08/2020 Lora Havens, MD     06/08/2020 12:20 PM Patient Name: LANGSTON SUMMERFIELD MRN: [id number] Epilepsy Attending: Lora Havens Referring Physician/Provider: Dr Baird Kay Date: 06/08/2020 Duration: 24.35 mins Patient history: 86yo F with abnormal eye movements and left eye was deviated to the left with nystagmus. EEG to evaluate for seizure. Level of alertness: Awake, drowsy AEDs during EEG study: None Technical aspects: This EEG study was done with scalp electrodes positioned according to the 10-20 International system of electrode placement. Electrical activity was acquired at a sampling rate of 500Hz  and reviewed with a high frequency filter of 70Hz  and a low frequency filter of 1Hz . EEG data were recorded continuously and digitally stored. Description: The posterior dominant rhythm consists of 8 Hz activity of moderate voltage (25-35 uV) seen predominantly in posterior head regions, symmetric and reactive to eye opening and eye closing. Drowsiness was characterized by attenuation of the posterior background rhythm.  EEG showed intermittent left temporal 3 to 6 Hz theta-delta slowing.  Hyperventilation and photic stimulation were not performed.   ABNORMALITY -Intermittent slow, left temporal region IMPRESSION: This study is suggestive of non specific cortical dysfunction in left temporal region.  No seizures or epileptiform discharges were seen throughout the recording. Lora Havens   DG Chest 2 View  Result Date: 06/06/2020 CLINICAL DATA:  Weakness, malaise EXAM: CHEST - 2 VIEW COMPARISON:  10/09/2018 FINDINGS: Surgical clips are seen within the right hilum in surgical staple line is seen within the right suprahilar region likely related to right upper lobectomy. There is chronic collapse and consolidation of the left upper lobe with associated left-sided volume loss. No superimposed confluent pulmonary infiltrate. No pneumothorax or pleural effusion. Cardiac size within normal limits. The  pulmonary vascularity is normal. No acute bone abnormality. IMPRESSION: Stable examination with chronic collapse of the left upper lobe and probable surgical changes of right upper  lobectomy. No acute cardiopulmonary disease. Electronically Signed   By: Fidela Salisbury MD   On: 06/06/2020 22:29   DG Abd 1 View  Result Date: 06/07/2020 CLINICAL DATA:  86 year old female with nausea and vomiting. EXAM: ABDOMEN - 1 VIEW COMPARISON:  CT abdomen pelvis dated 04/09/2017. FINDINGS: There is no bowel dilatation or evidence of obstruction. Colonic diverticulosis noted. No free air or radiopaque calculi. There is degenerative changes of the spine and scoliosis. No acute osseous pathology. IMPRESSION: Colonic diverticulosis.  No evidence of bowel obstruction. Electronically Signed   By: Anner Crete M.D.   On: 06/07/2020 03:26   CT HEAD WO CONTRAST  Result Date: 06/06/2020 CLINICAL DATA:  86 year old female with altered mental status. EXAM: CT HEAD WITHOUT CONTRAST TECHNIQUE: Contiguous axial images were obtained from the base of the skull through the vertex without intravenous contrast. COMPARISON:  None. FINDINGS: Brain: Moderate age-related atrophy and chronic microvascular ischemic changes. See there is no acute intracranial hemorrhage. No mass effect or midline shift. No extra-axial fluid collection. Vascular: No hyperdense vessel or unexpected calcification. Skull: Normal. Negative for fracture or focal lesion. Sinuses/Orbits: No acute finding. Other: None IMPRESSION: 1. No acute intracranial pathology. 2. Moderate age-related atrophy and chronic microvascular ischemic changes. Electronically Signed   By: Anner Crete M.D.   On: 06/06/2020 22:36   CT ANGIO NECK W OR WO CONTRAST  Result Date: 06/08/2020 CLINICAL DATA:  Stroke. EXAM: CT ANGIOGRAPHY NECK TECHNIQUE: Multidetector CT imaging of the neck was performed using the standard protocol during bolus administration of intravenous contrast.  Multiplanar CT image reconstructions and MIPs were obtained to evaluate the vascular anatomy. Carotid stenosis measurements (when applicable) are obtained utilizing NASCET criteria, using the distal internal carotid diameter as the denominator. CONTRAST:  32mL OMNIPAQUE IOHEXOL 350 MG/ML SOLN COMPARISON:  MRI head and MRA head 06/07/2020 FINDINGS: Aortic arch: Atherosclerotic disease aortic arch and proximal great vessels. Proximal great vessels are adequately patent. Mild stenosis in the innominate artery. Right carotid system: Calcified and noncalcified plaque right carotid bifurcation without significant stenosis or thrombus. Left carotid system: Mild atherosclerotic disease left common carotid artery without significant stenosis. Mild atherosclerotic disease left carotid bifurcation without significant stenosis or thrombus. Vertebral arteries: Right vertebral artery has 2 separate origins from the right subclavian artery. These join at the C4-5 level. No significant right vertebral artery stenosis. Right PICA patent. Right AICA patent. Mild stenosis at the origin of the left vertebral artery. Left vertebral artery is then patent to the basilar without significant additional stenosis. Left PICA not visualized. Left AICA is patent. Skeleton: Disc and facet degeneration throughout the cervical spine without acute skeletal abnormality. Other neck: No soft tissue mass or adenopathy. Upper chest: Left upper lobe partial collapse/scarring. This is chronic and unchanged from CT chest 04/29/2018 and may be related to prior radiation. IMPRESSION: 1. Atherosclerotic disease of the carotid bifurcation bilaterally without significant carotid stenosis. 2. No significant vertebral artery stenosis. Mild stenosis origin of left vertebral artery. Right PICA patent. Left PICA not visualized. Bilateral AICA patent. 3. Post radiation changes left upper lobe. 4.  Aortic Atherosclerosis (ICD10-I70.0). Electronically Signed   By:  Franchot Gallo M.D.   On: 06/08/2020 14:05   MR ANGIO HEAD WO CONTRAST  Addendum Date: 06/07/2020   ADDENDUM REPORT: 06/07/2020 16:56 ADDENDUM: These results were called by telephone at the time of interpretation on 06/07/2020 at 4:56 pm to provider Dr. Alfredia Ferguson , who verbally acknowledged these results. Electronically Signed   By: Kellie Simmering  DO   On: 06/07/2020 16:56   Result Date: 06/07/2020 CLINICAL DATA:  Stroke, follow-up. EXAM: MRA HEAD WITHOUT CONTRAST TECHNIQUE: Angiographic images of the Circle of Willis were obtained using MRA technique without intravenous contrast. COMPARISON:  Brain MRI performed earlier the same day 06/07/2020, FINDINGS: The intracranial internal carotid arteries are patent without significant stenosis. The M1 middle cerebral arteries are patent without significant stenosis. No M2 proximal branch occlusion or high-grade proximal stenosis is identified. The anterior cerebral arteries are patent. No intracranial aneurysm is identified. The intracranial vertebral arteries are patent without significant stenosis. The left PICA is poorly delineated proximally. The basilar artery is patent without significant stenosis. The right posterior cerebral artery is patent without significant proximal stenosis. There is occlusion of the left posterior cerebral artery at the level of the distal P2 segment. A right posterior communicating artery is present. The left posterior communicating artery is hypoplastic or absent. 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 The proximal left PICA is poorly delineated. This may be due to small vessel size, high-grade stenosis or partial occlusion. No anterior circulation large vessel occlusion or proximal high-grade arterial stenosis. Electronically Signed: By: Kellie Simmering DO On: 06/07/2020 16:34   MR BRAIN WO CONTRAST  Result Date: 06/07/2020 CLINICAL DATA:  Encephalopathy. EXAM: MRI HEAD WITHOUT  CONTRAST TECHNIQUE: Multiplanar, multiecho pulse sequences of the brain and surrounding structures were obtained without intravenous contrast. COMPARISON:  06/06/2020 head CT. FINDINGS: Brain: Multifocal restricted diffusion involving the medial left occipital lobe with extension into the left hippocampal tail and body. Restricted diffusion involving the bilateral cerebellum with tiny foci of restricted diffusion involving the left superior cerebellar peduncle, dorsal left thalamus (5:74) and dorsomedial right mid brain (5:69, 67). All of the aforementioned acute infarcts demonstrate correlative T2/FLAIR hyperintense signal and edema. Bilateral hippocampal sulcal remnant cysts. Moderate cerebral atrophy with ex vacuo dilatation. No midline shift, mass lesion or extra-axial fluid collection. Background scattered and confluent T2/FLAIR supratentorial white matter foci reflect chronic microvascular ischemic changes. Remote right pontine microhemorrhage. Vascular: Normal flow voids. Skull and upper cervical spine: Normal marrow signal. Sinuses/Orbits: Normal orbits. Sequela of bilateral lens replacement. Clear paranasal sinuses. No mastoid effusion. Other: None. IMPRESSION: Multifocal acute infarcts involving the left occipital lobe, left hippocampus, dorsal left thalamus, bilateral cerebellum, dorsomedial right mid brain and left superior cerebellar peduncle. Moderate cerebral atrophy. Moderate to severe chronic microvascular ischemic changes. These results were called by telephone at the time of interpretation on 06/07/2020 at 4:04 pm to provider [facility] , who verbally acknowledged these results. Electronically Signed   By: Primitivo Gauze M.D.   On: 06/07/2020 16:11   VAS [place] LOWER EXTREMITY VENOUS (DVT)  Result Date: 06/08/2020  Lower Venous DVTStudy Indications: Stroke.  Comparison Study: no prior Performing Technologist: Abram Sander RVS  Examination Guidelines: A complete evaluation includes B-mode  imaging, spectral Doppler, color Doppler, and power Doppler as needed of all accessible portions of each vessel. Bilateral testing is considered an integral part of a complete examination. Limited examinations for reoccurring indications may be performed as noted. The reflux portion of the exam is performed with the patient in reverse Trendelenburg.  +---------+---------------+---------+-----------+----------+--------------+  RIGHT     Compressibility Phasicity Spontaneity Properties Thrombus Aging  +---------+---------------+---------+-----------+----------+--------------+  CFV       Full            Yes       Yes                                    +---------+---------------+---------+-----------+----------+--------------+  SFJ       Full                                                             +---------+---------------+---------+-----------+----------+--------------+  FV Prox   Full                                                             +---------+---------------+---------+-----------+----------+--------------+  FV Mid    Full                                                             +---------+---------------+---------+-----------+----------+--------------+  FV Distal Full                                                             +---------+---------------+---------+-----------+----------+--------------+  PFV       Full                                                             +---------+---------------+---------+-----------+----------+--------------+  POP       Full            Yes       Yes                                    +---------+---------------+---------+-----------+----------+--------------+  PTV       Full                                                             +---------+---------------+---------+-----------+----------+--------------+  PERO      Full                                                             +---------+---------------+---------+-----------+----------+--------------+    +---------+---------------+---------+-----------+----------+--------------+  LEFT      Compressibility Phasicity Spontaneity Properties Thrombus Aging  +---------+---------------+---------+-----------+----------+--------------+  CFV       Full            Yes       Yes                                    +---------+---------------+---------+-----------+----------+--------------+  SFJ       Full                                                             +---------+---------------+---------+-----------+----------+--------------+  FV Prox   Full                                                             +---------+---------------+---------+-----------+----------+--------------+  FV Mid    Full                                                             +---------+---------------+---------+-----------+----------+--------------+  FV Distal Full                                                             +---------+---------------+---------+-----------+----------+--------------+  PFV       Full                                                             +---------+---------------+---------+-----------+----------+--------------+  POP       Full            Yes       Yes                                    +---------+---------------+---------+-----------+----------+--------------+  PTV       Full                                                             +---------+---------------+---------+-----------+----------+--------------+  PERO      Full                                                             +---------+---------------+---------+-----------+----------+--------------+     Summary: BILATERAL: - No evidence of deep vein thrombosis seen in the lower extremities, bilaterally. -No evidence of popliteal cyst, bilaterally.   *See table(s) above for measurements and observations. Electronically signed by Monica Martinez MD on 06/08/2020 at 3:50:31 PM.    Final    Scheduled Meds:   stroke: mapping  our early stages of  recovery book   Does not apply Once   amLODipine  5 mg Oral Daily   aspirin  300 mg Rectal Daily   Or   aspirin  325 mg Oral Daily   atenolol  50 mg Oral Daily   enoxaparin (LOVENOX) injection  30 mg Subcutaneous Q24H   levothyroxine  50 mcg Oral QAC breakfast   pravastatin  40 mg Oral Daily   Continuous Infusions:   LOS: 1 day   Kerney Elbe, DO [facility] PAGER is on AMION  If 7PM-7AM, please contact night-coverage [email or web address]

## 2020-06-08 NOTE — Progress Notes (Signed)
EEG Completed; Results Pending  

## 2020-06-08 NOTE — Progress Notes (Signed)
OT Cancellation Note  Patient Details Name: Valerie Young MRN: [id number] DOB: 27-Mar-1929   Cancelled Treatment:    Reason Eval/Treat Not Completed: Patient at procedure or test/ unavailable.   x2 attempts for OT eval today with pt undergoing EEG this AM and currently at CT now. Will re-attempt eval as time allows.   Layla Maw 06/08/2020, 2:23 PM

## 2020-06-09 ENCOUNTER — Inpatient Hospital Stay (HOSPITAL_COMMUNITY): Payer: Medicare Other

## 2020-06-09 DIAGNOSIS — C3492 Malignant neoplasm of unspecified part of left bronchus or lung: Secondary | ICD-10-CM

## 2020-06-09 DIAGNOSIS — B9689 Other specified bacterial agents as the cause of diseases classified elsewhere: Secondary | ICD-10-CM

## 2020-06-09 DIAGNOSIS — Z86711 Personal history of pulmonary embolism: Secondary | ICD-10-CM

## 2020-06-09 DIAGNOSIS — N390 Urinary tract infection, site not specified: Secondary | ICD-10-CM

## 2020-06-09 DIAGNOSIS — Z86718 Personal history of other venous thrombosis and embolism: Secondary | ICD-10-CM

## 2020-06-09 LAB — CBC WITH DIFFERENTIAL/PLATELET
Abs Immature Granulocytes: 0.03 10*3/uL (ref 0.00–0.07)
Basophils Absolute: 0.1 10*3/uL (ref 0.0–0.1)
Basophils Relative: 1 %
Eosinophils Absolute: 0.1 10*3/uL (ref 0.0–0.5)
Eosinophils Relative: 1 %
HCT: 42 % (ref 36.0–46.0)
Hemoglobin: 13.3 g/dL (ref 12.0–15.0)
Immature Granulocytes: 0 %
Lymphocytes Relative: 21 %
Lymphs Abs: 2.2 10*3/uL (ref 0.7–4.0)
MCH: 31.7 pg (ref 26.0–34.0)
MCHC: 31.7 g/dL (ref 30.0–36.0)
MCV: 100.2 fL — ABNORMAL HIGH (ref 80.0–100.0)
Monocytes Absolute: 1.2 10*3/uL — ABNORMAL HIGH (ref 0.1–1.0)
Monocytes Relative: 12 %
Neutro Abs: 7 10*3/uL (ref 1.7–7.7)
Neutrophils Relative %: 65 %
Platelets: 236 10*3/uL (ref 150–400)
RBC: 4.19 MIL/uL (ref 3.87–5.11)
RDW: 12.2 % (ref 11.5–15.5)
WBC: 10.6 10*3/uL — ABNORMAL HIGH (ref 4.0–10.5)
nRBC: 0 % (ref 0.0–0.2)

## 2020-06-09 LAB — PHOSPHORUS: Phosphorus: 2.2 mg/dL — ABNORMAL LOW (ref 2.5–4.6)

## 2020-06-09 LAB — MAGNESIUM: Magnesium: 2.1 mg/dL (ref 1.7–2.4)

## 2020-06-09 LAB — COMPREHENSIVE METABOLIC PANEL
ALT: 10 U/L (ref 0–44)
AST: 25 U/L (ref 15–41)
Albumin: 3.6 g/dL (ref 3.5–5.0)
Alkaline Phosphatase: 66 U/L (ref 38–126)
Anion gap: 10 (ref 5–15)
BUN: 20 mg/dL (ref 8–23)
CO2: 23 mmol/L (ref 22–32)
Calcium: 9.4 mg/dL (ref 8.9–10.3)
Chloride: 105 mmol/L (ref 98–111)
Creatinine, Ser: 1.12 mg/dL — ABNORMAL HIGH (ref 0.44–1.00)
GFR calc Af Amer: 50 mL/min — ABNORMAL LOW (ref >60)
GFR calc non Af Amer: 44 mL/min — ABNORMAL LOW (ref >60)
Glucose, Bld: 92 mg/dL (ref 70–99)
Potassium: 4 mmol/L (ref 3.5–5.1)
Sodium: 138 mmol/L (ref 135–145)
Total Bilirubin: 0.9 mg/dL (ref 0.3–1.2)
Total Protein: 6.6 g/dL (ref 6.5–8.1)

## 2020-06-09 LAB — URINE CULTURE: Culture: >=100000 — AB

## 2020-06-09 MED ORDER — IOHEXOL 350 MG/ML SOLN
80.0000 mL | Freq: Once | INTRAVENOUS | Status: AC | PRN
  Administered 2020-06-09: 80 mL via INTRAVENOUS

## 2020-06-09 MED ORDER — CEPHALEXIN 500 MG PO CAPS
500.0000 mg | ORAL_CAPSULE | Freq: Two times a day (BID) | ORAL | Status: AC
  Administered 2020-06-09 – 2020-06-11 (×4): 500 mg via ORAL
  Filled 2020-06-09 (×5): qty 1

## 2020-06-09 MED ORDER — K PHOS MONO-SOD PHOS DI & MONO 155-852-130 MG PO TABS
500.0000 mg | ORAL_TABLET | Freq: Two times a day (BID) | ORAL | Status: AC
  Administered 2020-06-09 – 2020-06-10 (×2): 500 mg via ORAL
  Filled 2020-06-09 (×2): qty 2

## 2020-06-09 NOTE — Evaluation (Signed)
Speech Language Pathology Evaluation Patient Details Name: Valerie Young MRN: [id number] DOB: August 28, 1929 Today's Date: 06/09/2020 Time: 1001-0904 SLP Time Calculation (min) (ACUTE ONLY): 13 min  Problem List:  Patient Active Problem List   Diagnosis Date Noted  . Cerebral embolism with cerebral infarction 06/08/2020  . Acute gastroenteritis 06/07/2020  . Gastroenteritis 06/07/2020  . Allergic rhinitis 10/21/2017  . Pneumothorax on left 04/09/2017  . Pleural effusion 04/09/2017  . Pneumothorax 04/09/2017  . Occlusion of left pulmonary artery ([place]) 04/09/2017  . Neoplasm   . Essential hypertension   . Adenocarcinoma, lung ([place])   . Lung cancer ([place]) 10/02/2015  . Hypothyroidism 10/02/2015  . Arthritis of knee 01/06/2014  . Essential hypertension, benign 01/06/2014  . Encounter for therapeutic drug monitoring 12/17/2013  . Pulmonary emboli ([place]) 11/23/2013  . DVT (deep venous thrombosis) ([place]) 11/23/2013  . COPD (chronic obstructive pulmonary disease) with emphysema ([place]) 11/22/2013  . Acute respiratory failure with hypoxia ([place]) 11/22/2013  . Leukocytosis, unspecified 11/22/2013  . Anemia of chronic disease 11/22/2013  . CAP (community acquired pneumonia) 11/22/2013  . Chronic kidney disease (CKD), stage IV (severe) ([place]) 11/15/2013  . Adenocarcinoma of lung ([place]) 12/11/2012  . Pulmonary nodule 12/03/2012  . HYPOTHYROIDISM 06/19/2007  . HYPERTENSION 06/19/2007   Past Medical History:  Past Medical History:  Diagnosis Date  . 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 Cancer ([place])    right lung ca - 30 years ago / left lung - 11/2012  . Cancer ([place])    Left lung  . CKD (chronic kidney disease)   . COPD (chronic obstructive pulmonary disease) ([place])   . DJD (degenerative joint disease)    knees  . H/O: lung cancer 1982   Right; treated with resection  . Hypertension   . Hypothyroidism   . PND (post-nasal drip)    Chronic   Past Surgical History:  Past  Surgical History:  Procedure Laterality Date  . ABDOMINAL HYSTERECTOMY     PARTIAL  . CATARACT EXTRACTION, BILATERAL    . JOINT REPLACEMENT     RIGHT TOTAL KNEE  . RADIATION OF LEFT LUNG    . right lung cancer treated with resection  1982  . right thigh postoperative hematoma  02-2008  . RIGHT UPPER LOBE REMOVED    . THYROID SURGERY    . THYROIDECTOMY    . TOTAL KNEE ARTHROPLASTY     Right  . VESICOVAGINAL FISTULA CLOSURE W/ TAH    . VIDEO BRONCHOSCOPY  12/08/2012   Procedure: VIDEO BRONCHOSCOPY WITH FLUORO;  Surgeon: Kathee Delton, MD;  Location: [facility];  Service: Cardiopulmonary;  Laterality: Bilateral;   HPI:  86 y.o. female with medical history significant of hypertension, hyperlipidemia, chronic kidney disease stage III with baseline creatinine 1.4, hypothyroidism, history of lung adenocarcinoma who arrives to ED on 7/13 with confusion, n/v. MRI head revelas multiple ischemic CVAs involving L PCA and R MLF pathway; brain regions including L hippocampus, L occipital lobe, L dorsal thalamus, bilateral cerebellar, and R midbrain. 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 No acute cardiopulmonary disease   Assessment / Plan / Recommendation Clinical Impression  Pt exhibits communication-cognitive impairment with increased lethargy this morning therefore assessment brief. Yesterday she was more conversant, confused and therapist did not note as much language involvement. Today she could not name object given max cues. Recalled 2 items on breakfast tray. Pt is oriented to person  and month/yr but not situation or place. Significant visual disturbance and decreased on right side. Speech is intelligible but imprecise. ST will continue to follow and pt will need 24 hour supervision at discharge.        SLP Assessment  SLP Recommendation/Assessment: Patient needs continued Speech Lanaguage Pathology  Services SLP Visit Diagnosis: Cognitive communication deficit (R41.841)    Follow Up Recommendations  24 hour supervision/assistance;Inpatient Rehab    Frequency and Duration min 2x/week  2 weeks      SLP Evaluation Cognition  Overall Cognitive Status: Impaired/Different from baseline Arousal/Alertness: Lethargic Orientation Level: Oriented to person;Oriented to time;Disoriented to place;Disoriented to situation Attention: Sustained Sustained Attention: Impaired Sustained Attention Impairment: Verbal basic;Functional basic Memory: Impaired Memory Impairment: Decreased short term memory;Retrieval deficit Awareness: Impaired Awareness Impairment: Intellectual impairment;Emergent impairment;Anticipatory impairment Problem Solving: Impaired Problem Solving Impairment: Verbal basic;Functional basic Safety/Judgment: Impaired       Comprehension  Auditory Comprehension Overall Auditory Comprehension: Appears within functional limits for tasks assessed Visual Recognition/Discrimination Discrimination: Not tested    Expression Expression Primary Mode of Expression: Verbal Verbal Expression Overall Verbal Expression: Impaired Initiation: No impairment Level of Generative/Spontaneous Verbalization: Sentence Repetition: No impairment Naming: Impairment Confrontation:  (will assess more-visual disturbance too) Pragmatics: No impairment Written Expression Dominant Hand: Right Written Expression:  (TBA)   Oral / Motor  Oral Motor/Sensory Function Overall Oral Motor/Sensory Function: Mild impairment Facial ROM: Reduced right Facial Symmetry: Abnormal symmetry left Motor Speech Overall Motor Speech: Impaired Respiration: Within functional limits Phonation: Normal Resonance: Within functional limits Articulation: Impaired Level of Impairment: Conversation Intelligibility: Intelligible Motor Planning: Witnin functional limits                      [place] Siren 06/09/2020, 10:36 AM  Orbie Pyo Colvin Caroli.Ed Risk analyst [phone] Office [phone]

## 2020-06-09 NOTE — [place] FL2 (Signed)
[place] MEDICAID FL2 LEVEL OF CARE SCREENING TOOL     IDENTIFICATION  Patient Name: Valerie Young Birthdate: January 25, 1929 Sex: female Admission Date (Current Location): 06/06/2020  [facility] and [place] Number:  Herbalist and Address:  [facility], [place] [address] [place], [place] [address]      Provider Number: [id number]  Attending Physician Name and Address:  Kerney Elbe, DO  Relative Name and Phone Number:       Current Level of Care: Hospital Recommended Level of Care: [place] Prior Approval Number:    Date Approved/Denied:   PASRR Number: [id number]  Discharge Plan: SNF    Current Diagnoses: Patient Active Problem List   Diagnosis Date Noted   Cerebral embolism with cerebral infarction 06/08/2020   Acute gastroenteritis 06/07/2020   Gastroenteritis 06/07/2020   Allergic rhinitis 10/21/2017   Pneumothorax on left 04/09/2017   Pleural effusion 04/09/2017   Pneumothorax 04/09/2017   Occlusion of left pulmonary artery ([employer] [place]) 04/09/2017   Neoplasm    Essential hypertension    Adenocarcinoma, lung ([place])    Lung cancer ([place]) 10/02/2015   Hypothyroidism 10/02/2015   Arthritis of knee 01/06/2014   Essential hypertension, benign 01/06/2014   Encounter for therapeutic drug monitoring 12/17/2013   Pulmonary emboli ([place]) 11/23/2013   DVT (deep venous thrombosis) ([place]) 11/23/2013   COPD (chronic obstructive pulmonary disease) with emphysema ([place]) 11/22/2013   Acute respiratory failure with hypoxia ([place]) 11/22/2013   Leukocytosis, unspecified 11/22/2013   Anemia of chronic disease 11/22/2013   CAP (community acquired pneumonia) 11/22/2013   Chronic kidney disease (CKD), stage IV (severe) ([place]) 11/15/2013   Adenocarcinoma of lung ([place]) 12/11/2012   Pulmonary nodule 12/03/2012   HYPOTHYROIDISM 06/19/2007   HYPERTENSION 06/19/2007    Orientation RESPIRATION BLADDER Height & Weight      Self, Time    Incontinent Weight:   Height:     BEHAVIORAL SYMPTOMS/MOOD NEUROLOGICAL BOWEL NUTRITION STATUS      Incontinent Diet  AMBULATORY STATUS COMMUNICATION OF NEEDS Skin   Extensive Assist Verbally Normal                       Personal Care Assistance Level of Assistance  Bathing, Feeding, Dressing Bathing Assistance: Maximum assistance Feeding assistance: Limited assistance Dressing Assistance: Maximum assistance     Functional Limitations Info  Speech, Hearing, Sight Sight Info: Adequate Hearing Info: Adequate Speech Info: Adequate    SPECIAL CARE FACTORS FREQUENCY  PT (By licensed PT), OT (By licensed OT)     PT Frequency: 5x a week OT Frequency: 5x a week            Contractures Contractures Info: Not present    Additional Factors Info  Code Status, Allergies Code Status Info: Full Allergies Info: sulfamthoxade- trimethoprim           Current Medications (06/09/2020):  This is the current hospital active medication list Current Facility-Administered Medications  Medication Dose Route Frequency Provider Last Rate Last Admin    stroke: mapping our early stages of recovery book   Does not apply Once Marliss Coots, PA-C       acetaminophen (TYLENOL) tablet 650 mg  650 mg Oral Q6H PRN Poplawski, Rafal, MD       Or   acetaminophen (TYLENOL) suppository 650 mg  650 mg Rectal Q6H PRN Poplawski, Rafal, MD       albuterol (PROVENTIL) (2.5 MG/3ML) 0.083% nebulizer solution 2.5 mg  2.5 mg Nebulization Q2H PRN Poplawski, Rafal, MD       aspirin suppository 300 mg  300 mg Rectal Daily Marliss Coots, PA-C       Or   aspirin tablet 325 mg  325 mg Oral Daily Marliss Coots, PA-C   325 mg at 06/09/20 0849   atenolol (TENORMIN) tablet 50 mg  50 mg Oral Daily Poplawski, Rafal, MD   50 mg at 06/09/20 0850   enoxaparin (LOVENOX) injection 30 mg  30 mg Subcutaneous Q24H Poplawski, Rafal, MD   30 mg at 06/09/20 1452   HYDROcodone-acetaminophen  (NORCO/VICODIN) 5-325 MG per tablet 1-2 tablet  1-2 tablet Oral Q4H PRN Poplawski, Rafal, MD       levothyroxine (SYNTHROID) tablet 50 mcg  50 mcg Oral QAC breakfast Poplawski, Rafal, MD   50 mcg at 06/09/20 0850   morphine 2 MG/ML injection 2 mg  2 mg Intravenous Q2H PRN Poplawski, Rafal, MD       ondansetron (ZOFRAN) tablet 4 mg  4 mg Oral Q6H PRN Poplawski, Rafal, MD       Or   ondansetron (ZOFRAN) injection 4 mg  4 mg Intravenous Q6H PRN Poplawski, Rafal, MD   4 mg at 06/07/20 1744   phosphorus (K PHOS NEUTRAL) tablet 500 mg  500 mg Oral BID Sheikh, Omair Latif, DO   500 mg at 06/09/20 0849   polyethylene glycol (MIRALAX / GLYCOLAX) packet 17 g  17 g Oral Daily PRN Poplawski, Rafal, MD       pravastatin (PRAVACHOL) tablet 40 mg  40 mg Oral Daily Poplawski, Rafal, MD   40 mg at 06/09/20 2010     Discharge Medications: Please see discharge summary for a list of discharge medications.  Relevant Imaging Results:  Relevant Lab Results:   Additional Information SNN: [id number]  Emeterio Reeve, [place]

## 2020-06-09 NOTE — Progress Notes (Signed)
PROGRESS NOTE    Valerie Young  XVQ:[id number] DOB: 09-06-1929 DOA: 06/06/2020 PCP: Collene Gobble, MD   Brief Narrative:  The patient is a 86 year old occasion female with a past medical history significant for but not limited to hypertension, hyperlipidemia, chronic kidney stage IIIb with a baseline creatinine of 1.4 hypothyroidism, history of lung adenocarcinoma as well as other comorbidities who came to the hospital for further evaluation of her confusion as well as having persistent nausea vomiting.  Patient states that she was in her normal usual state of health and remembers eating a small piece of sausage in the afternoon and subsequently felt ill.  She was seen by her neighbor sitting on the porch very confused and acting very different.  She stated that she felt very ill and unable to state what happened to her.  She reported nausea with vomiting and epigastric abdominal pain she is brought to the emergency room for her persisting symptoms.  Initially in the ED she was given IV fluid resuscitation and confusion improved however again she continued to have some nausea vomiting.  On admission she had a CBC showing a WBC of 17.7 which is now improved to 12.2.  There is no clear source of infection noted and she had a normal ammonia level.  TSH was also within normal limits at 4.041, however T4 was slightly elevated.  Urinalysis was not suggestive of infection but did show five ketones, moderate leukocytes, negative nitrites, few bacteria, 0-5 RBCs per high-power field, 0-5 squamous epithelial cells and 11-20 WBCs.  Urine culture still pending. She had a head CT without contrast which showed no acute intracranial pathology and showed moderate age-related atrophy and chronic microvascular ischemic changes.   **Interim History Yesterday afternoon missed with abduction and further work-up was initiated and she had an MRI and an MRI of the brain and neck and she is found to have an acute ischemic CVA  with left PCA-P2 segment occlusion.  Further work-up was initiated and she had a CTA of the head and neck done in a transthoracic echo was ordered and was done and still pending read.  She was started on aspirin 325 mg p.o. daily and given permissive hypertension.  Hemoglobin A1c and lipid panel ordered and she is placed on [facility] and she is n.p.o. until she passes full screen.  PT OT recommending SNF for placement.  She also underwent an EEG with nonspecific cortical dysfunction in the temporal region but no seizures or epileptiform activity was seen throughout the recording.  Because neurology feels that this is embolic so she underwent a lower extremity duplex which was negative for DVT.  I discussed the case with neurology Dr. Erlinda Hong who is going to be ordering a a loop recorder to be placed prior to discharge.    Patient underwent a CTA of the chest and a CT of the abdomen pelvis with contrast today and showed no evidence of abnormality in the abdomen/pelvis and no evidence of abdominal pelvic metastatic disease.  She was noted to have colonic diverticulosis without diverticulitis and the lungs were also evaluated and did not show any embolus.  She did have a right upper lobe lobectomy and a prior left lobe subpleural nodule which was was essentially unchanged.  She did also have a new 5 mm subpleural nodule in the left lower lobe and a 3 mm nodule in the central right lung.  PET CT scan was recommended.  Of note she did have a Klebsiella UTI  present on admission however because of her confusion we will start treatment with p.o. Keflex given that it is pansensitive.  Assessment & Plan:   Principal Problem:   Acute gastroenteritis Active Problems:   Chronic kidney disease (CKD), stage IV (severe) (HCC)   COPD (chronic obstructive pulmonary disease) with emphysema (HCC)   Essential hypertension, benign   Hypothyroidism   Adenocarcinoma, lung (HCC)   Essential hypertension   Gastroenteritis    Cerebral embolism with cerebral infarction  Acute CVA -Started having difficulty with her left eye vision and had some deviation yesterday afternoon -Head CT Scan showed "No acute intracranial pathology. Moderate age-related atrophy and chronic microvascular ischemic changes." -CTA Neck showed "Atherosclerotic disease of the carotid bifurcation bilaterally without significant carotid stenosis. No significant vertebral artery stenosis. Mild stenosis origin of left vertebral artery. Right PICA patent. Left PICA not visualized. Bilateral AICA patent. Post radiation changes left upper lobe. Aortic Atherosclerosis."  -Neurology consulted and appreciate their recommendations  -Stroke team following now -EEG also done and as below which showed nonspecific cortical dysfunction in temporal region but no seizures or epileptiform activity seen throughout the recording. -PT OT and SLP to evaluate and treat and SLP recommends regular diet with thin liquids and PT OT recommending SNF and social work is assisting with placement Continue neurochecks per protocol  -continue telemetry monitoring -Check hemoglobin A1c and was 5.9 -Patient's panel showed a total cholesterol/HDL ratio 2.6, cholesterol level of 141, HDL level 55, LDL of 70, triglycerides of 82, VLDL 16 -Started on aspirin 325 by neurology and will also continue her pravastatin will defer to neurology to change to atorvastatin but for now she will be continued on pravastatin; I discussed the case with Dr. Erlinda Hong we will start the patient on dual antiplatelet therapy now for 3 weeks and then change back to aspirin -further care per neurology and echocardiogram was done and Dr Erlinda Hong considering implanting a loop recorder given her unclear etiology of her embolic CVA and is able to be done Monday -CTA of the chest and CT of the abdomen pelvis done and as below  Intractable nausea vomiting likely due to acute gastroenteritis, improving -Patient's reports  possible food poisoning but symptoms could have been related to UTI.No other significant history reported. -WBC is trending down and went from 17.7 -> 10.6 today -IV fluid hydration is now stopped -Continue with Antiemetics with p.o./IV Zofran every 6 hours as needed for nausea; she is given a dose of IV Zofran as well as a dose of IV Compazine in the ED -Clear liquid diet advance as tolerated and she is now on a regular diet and will need to monitor carefully as she was nauseous this morning -CT of the abdomen pelvis with contrast showed "No acute abnormality in the abdomen/pelvis. No evidence of abdominopelvic metastatic disease. Colonic diverticulosis without diverticulitis." -We will start empiric treatment for Klebsiella UTI present on admission  Klebsiella UTI present on admission -Presented with a leukocytosis, encephalopathy and urinalysis done showed clear appearance with moderate leukocytes, few bacteria, 11-20 WBCs and urine culture grew out greater than 100,000 colony-forming units of Klebsiella -We will start p.o. Keflex given that she likely was symptomatic with her confusion nausea and vomiting  Acute metabolic encephalopathy likely from above, initially improved but with waxing and waning -Acute encephalopathy was improving and head CT was negative as above however she was confused yesterday and a little this morning and will continue with delirium precautions -Work upgrossly unrevealing with essentially normal ABG, ammonia  level,no acute signs of infection. -Continue with pain control with p.o. Norco/Vicodin and IV morphine -We will obtain a PT OT evaluation to determine a safe discharge disposition for this patient and they are recommending SNF -Continue to monitor carefully and will treat her acute CVA with aspirin and Plavix and also treat her urinary tract infection with p.o. antibiotics  Hypertension  -Blood pressure stable but slightly elevated as her blood pressure was  160/71 -Continued Amlodipine 5 mg po Daily and Atenolol 50 mg po Daily  but likely will allow for permissive hypertension and will stop amlodipine  Hypothyroidism -Thyroid function appears to be within normal limits at 4.041 -Free T4 was slightly elevated at 1.19 -Continue Levothyroxine 50 mcg po Daily   Hyperlipidemia -Lipid panel as above -Continue with Pravastatin 40 m p.o. daily defer to neurology to change to atorvastatin 80 mg p.o. nightly but they feel that they will not do more intensive statin given her advanced age and LDL being at goal  Chronic Kidney Disease Stage IIIa,  Metabolic Acidosis -Renal function is stable,with most recent creatinine being 1.4 -BUN/creatinine went from 34/1.29 on admission and trended down and today it is 20/1.2 -Patient CO2 on admission was 21, chloride level was 104, and anion gap was 14; now CO2 is 23, anion gap is 10 and chloride level is 105 -avoid nephrotoxic medications, contrast dyes, hypotension and renally adjust medications  -Repeat CMP in a.m.  History of Lung Adenocarcinoma -Patient continue to follow-up routinely with pulmonology for surveillance of cancer recurrence -After discussion with neurology will obtain a pan scan of her tomorrow and obtain a CT scan of the chest abdomen and pelvis to look for potential causes for her embolic stroke and given her hypercoagulability given history of lung cancer -CTA of the chest with contrast done showed "No pulmonary embolus. Post right upper lobectomy. 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 Previous left lower lobe subpleural nodule in the medial left lung base measures 14 x 10 mm, previously 12 x 9 mm in 2019. Lack of significant interval change favors post treatment related change, however not definitively characterized. Subpleural area of consolidation in the medial right lower lobe has increased in size and density  from 2019 when it was ground-glass. This is indeterminate. New/increased 5 mm subpleural nodule in the left lower lobe. 3 mm nodule in the central right lung, not definitively seen on prior exam." -Recommendation is for  "PET CT may be helpful for evaluation of metabolic activity of the larger lesions. Smaller nodules are too small for PET characterization." and this can be done in the outpatient setting  Acquired Thrombophilia -Has a history of a PE and DVT in the past and has a history of lung cancer -Previously was anticoagulated with Coumadin but no longer taking the Coumadin -She had further work-up in her lower extremity DVT study was negative for DVTs currently -CTA PE did not show any evidence of PE -Currently going to be treated with p.o. aspirin and Plavix for her CVA  DVT prophylaxis: Enoxaparin 30 mg sq q24h Code Status: FULL CODE  Family Communication: No family present at bedside but called daughter to update her Disposition Plan: SNF when cleared by Neurology,: Patient is likely going a loop recorder on Monday  Status is: Inpatient  Remains inpatient appropriate because:Unsafe d/c plan, IV treatments appropriate due to intensity of illness or inability to take PO and Inpatient level of care appropriate due to  severity of illness   Dispo: The patient is from: Home              Anticipated d/c is to: SNF              Anticipated d/c date is: 2 days              Patient currently is not medically stable to d/c.  Consultants:  Neurology   Procedures:  ECHOCARDIOGRAM; done and pending Read  LE VENOUS DUPLEX BILATERAL:  - No evidence of deep vein thrombosis seen in the lower extremities,  bilaterally.  -No evidence of popliteal cyst, bilaterally.   EEG Description: The posterior dominant rhythm consists of 8 Hz activity of moderate voltage (25-35 uV) seen predominantly in posterior head regions, symmetric and reactive to eye opening and eye closing. Drowsiness was  characterized by attenuation of the posterior background rhythm.  EEG showed intermittent left temporal 3 to 6 Hz theta-delta slowing.  Hyperventilation and photic stimulation were not performed.     ABNORMALITY -Intermittent slow, left temporal region  IMPRESSION: This study is suggestive of non specific cortical dysfunction in left temporal region.  No seizures or epileptiform discharges were seen throughout the recording.  Antimicrobials:  Anti-infectives (From admission, onward)   Start     Dose/Rate Route Frequency Ordered Stop   06/09/20 1530  cephALEXin (KEFLEX) capsule 500 mg     Discontinue     500 mg Oral Every 12 hours 06/09/20 1518 06/11/20 2159     Subjective: Seen and examined at bedside and she is very drowsy and somnolent this morning.  She is little confused and she states that she is "in the health home".  No nausea or vomiting today.  Denies any lightheadedness or dizziness.  We will treat her empirically for Klebsiella UTI given her encephalopathy on admission and nausea vomiting.  PT OT recommending SNF and she will be going for a loop recorder likely Monday prior to being discharged to skilled nursing facility.  Objective: Vitals:   06/08/20 0746 06/08/20 2148 06/09/20 0742 06/09/20 1510  BP: (!) 151/64 (!) 155/66 (!) 191/76 (!) 160/71  Pulse: 72 68 65 67  Resp: 14 16 18 17   Temp: 98.4 F (36.9 C) 98.2 F (36.8 C) 97.6 F (36.4 C) 98.2 F (36.8 C)  TempSrc:  Oral    SpO2: 96% 97% 98% 98%    Intake/Output Summary (Last 24 hours) at 06/09/2020 1714 Last data filed at 06/08/2020 1940 Gross per 24 hour  Intake 120 ml  Output --  Net 120 ml   There were no vitals filed for this visit.  Examination: Physical Exam:  Constitutional: Patient is a thin elderly Caucasian female currently who is somnolent and drowsy and a little confused.  She does appear calm though Eyes: Lids are normal but she keeps her eyes closed ENMT: External Ears, Nose appear normal.   She is hard of hearing Neck: Appears normal, supple, no cervical masses, normal ROM, no appreciable thyromegaly; no JVD Respiratory: Diminished to auscultation bilaterally, no wheezing, rales, rhonchi or crackles. Normal respiratory effort and patient is not tachypenic. No accessory muscle use.  Unlabored breathing Cardiovascular: RRR, has a 2 out of 6 systolic murmur.  No appreciable extremity edema Abdomen: Soft, non-tender, non-distended. Bowel sounds positive.  GU: Deferred. Musculoskeletal: No clubbing / cyanosis of digits/nails. No joint deformity upper and lower extremities.  Skin: No rashes, lesions, ulcers on limited skin evaluation. No induration; Warm and dry.  Neurologic: CN  2-12 grossly intact with no focal deficits. Romberg sign cerebellar reflexes not assessed.  Psychiatric: Slightly impaired judgment and insight.  She is somnolent and a little drowsy. Normal mood and appropriate affect.   Data Reviewed: I have personally reviewed following labs and imaging studies  CBC: Recent Labs  Lab 06/06/20 2217 06/06/20 2221 06/07/20 0414 06/08/20 0251 06/09/20 0148  WBC 17.7*  --  12.2* 11.1* 10.6*  NEUTROABS 13.8*  --   --  8.1* 7.0  HGB 13.8 14.3 13.1 13.1 13.3  HCT 42.7 42.0 41.5 41.3 42.0  MCV 101.7*  --  101.5* 101.7* 100.2*  PLT 250  --  221 217 939   Basic Metabolic Panel: Recent Labs  Lab 06/06/20 2217 06/06/20 2221 06/07/20 0414 06/08/20 0251 06/09/20 0148  NA 139 140 140 138 138  K 3.9 4.0 4.3 4.2 4.0  CL 104  --  104 103 105  CO2 21*  --  24 26 23   GLUCOSE 174*  --  158* 100* 92  BUN 34*  --  27* 18 20  CREATININE 1.29*  --  1.29* 1.18* 1.12*  CALCIUM 9.9  --  9.2 9.5 9.4  MG  --   --   --  2.2 2.1  PHOS  --   --   --  2.7 2.2*   GFR: CrCl cannot be calculated (Unknown ideal weight.). Liver Function Tests: Recent Labs  Lab 06/06/20 2217 06/08/20 0251 06/09/20 0148  AST 23 23 25   ALT 14 13 10   ALKPHOS 73 67 66  BILITOT 0.6 0.8 0.9  PROT 7.8  7.0 6.6  ALBUMIN 4.2 3.8 3.6   No results for input(s): LIPASE, AMYLASE in the last 168 hours. Recent Labs  Lab 06/06/20 2217  AMMONIA 17   Coagulation Profile: No results for input(s): INR, PROTIME in the last 168 hours. Cardiac Enzymes: No results for input(s): CKTOTAL, CKMB, CKMBINDEX, TROPONINI in the last 168 hours. BNP (last 3 results) No results for input(s): PROBNP in the last 8760 hours. HbA1C: Recent Labs    06/08/20 0251  HGBA1C 5.9*   CBG: Recent Labs  Lab 06/06/20 2214  GLUCAP 173*   Lipid Profile: Recent Labs    06/08/20 0251  CHOL 141  HDL 55  LDLCALC 70  TRIG 82  CHOLHDL 2.6   Thyroid Function Tests: Recent Labs    06/06/20 2217  TSH 4.041  FREET4 1.19*   Anemia Panel: No results for input(s): VITAMINB12, FOLATE, FERRITIN, TIBC, IRON, RETICCTPCT in the last 72 hours. Sepsis Labs: Recent Labs  Lab 06/06/20 2217 06/07/20 0001  LATICACIDVEN 1.8 1.3    Recent Results (from the past 240 hour(s))  Urine culture     Status: Abnormal   Collection Time: 06/06/20  7:05 PM   Specimen: Urine, Random  Result Value Ref Range Status   Specimen Description URINE, RANDOM  Final   Special Requests   Final    NONE Performed at [facility], [address], [place], [place] [address]    Culture >=100,000 COLONIES/mL KLEBSIELLA PNEUMONIAE (A)  Final   Report Status 06/09/2020 FINAL  Final   Organism ID, Bacteria KLEBSIELLA PNEUMONIAE (A)  Final      Susceptibility   Klebsiella pneumoniae - MIC*    AMPICILLIN >=32 RESISTANT Resistant     CEFAZOLIN <=4 SENSITIVE Sensitive     CEFTRIAXONE <=0.25 SENSITIVE Sensitive     CIPROFLOXACIN <=0.25 SENSITIVE Sensitive     GENTAMICIN <=1 SENSITIVE Sensitive  IMIPENEM <=0.25 SENSITIVE Sensitive     NITROFURANTOIN <=16 SENSITIVE Sensitive     TRIMETH/SULFA <=20 SENSITIVE Sensitive     AMPICILLIN/SULBACTAM 8 SENSITIVE Sensitive     PIP/TAZO <=4 SENSITIVE Sensitive     * >=100,000 COLONIES/mL  KLEBSIELLA PNEUMONIAE  SARS Coronavirus 2 by RT PCR (hospital order, performed in [facility] hospital lab) Nasopharyngeal Nasopharyngeal Swab     Status: None   Collection Time: 06/07/20  2:22 AM   Specimen: Nasopharyngeal Swab  Result Value Ref Range Status   SARS Coronavirus 2 NEGATIVE NEGATIVE Final    Comment: (NOTE) SARS-CoV-2 target nucleic acids are NOT DETECTED.  The SARS-CoV-2 RNA is generally detectable in upper and lower respiratory specimens during the acute phase of infection. The lowest concentration of SARS-CoV-2 viral copies this assay can detect is 250 copies / mL. A negative result does not preclude SARS-CoV-2 infection and should not be used as the sole basis for treatment or other patient management decisions.  A negative result may occur with improper specimen collection / handling, submission of specimen other than nasopharyngeal swab, presence of viral mutation(s) within the areas targeted by this assay, and inadequate number of viral copies (<250 copies / mL). A negative result must be combined with clinical observations, patient history, and epidemiological information.  Fact Sheet for Patients:   StrictlyIdeas.no  Fact Sheet for Healthcare Providers: BankingDealers.co.za  This test is not yet approved or  cleared by the [place] FDA and has been authorized for detection and/or diagnosis of SARS-CoV-2 by FDA under an Emergency Use Authorization (EUA).  This EUA will remain in effect (meaning this test can be used) for the duration of the COVID-19 declaration under Section 564(b)(1) of the Act, 21 U.S.C. section 360bbb-3(b)(1), unless the authorization is terminated or revoked sooner.  Performed at [facility], [place] [address] [place], [place] [address]     RN Pressure Injury Documentation:     Estimated body mass index is 21.22 kg/m as calculated from the following:   Height as of 11/12/18:  5\' 4"  (1.626 m).   Weight as of 11/12/18: 56.1 kg.  Malnutrition Type:      Malnutrition Characteristics:      Nutrition Interventions:    Radiology Studies: EEG  Result Date: 06/08/2020 Lora Havens, MD     06/08/2020 12:20 PM Patient Name: Valerie Young MRN: [id number] Epilepsy Attending: Lora Havens Referring Physician/Provider: Dr Baird Kay Date: 06/08/2020 Duration: 24.35 mins Patient history: 86yo F with abnormal eye movements and left eye was deviated to the left with nystagmus. EEG to evaluate for seizure. Level of alertness: Awake, drowsy AEDs during EEG study: None Technical aspects: This EEG study was done with scalp electrodes positioned according to the 10-20 International system of electrode placement. Electrical activity was acquired at a sampling rate of 500Hz  and reviewed with a high frequency filter of 70Hz  and a low frequency filter of 1Hz . EEG data were recorded continuously and digitally stored. Description: The posterior dominant rhythm consists of 8 Hz activity of moderate voltage (25-35 uV) seen predominantly in posterior head regions, symmetric and reactive to eye opening and eye closing. Drowsiness was characterized by attenuation of the posterior background rhythm.  EEG showed intermittent left temporal 3 to 6 Hz theta-delta slowing.  Hyperventilation and photic stimulation were not performed.   ABNORMALITY -Intermittent slow, left temporal region IMPRESSION: This study is suggestive of non specific cortical dysfunction in left temporal region.  No seizures or epileptiform discharges  were seen throughout the recording. Priyanka Barbra Sarks   CT ANGIO NECK W OR WO CONTRAST  Result Date: 06/08/2020 CLINICAL DATA:  Stroke. EXAM: CT ANGIOGRAPHY NECK TECHNIQUE: Multidetector CT imaging of the neck was performed using the standard protocol during bolus administration of intravenous contrast. Multiplanar CT image reconstructions and MIPs were obtained to evaluate the  vascular anatomy. Carotid stenosis measurements (when applicable) are obtained utilizing NASCET criteria, using the distal internal carotid diameter as the denominator. CONTRAST:  40mL OMNIPAQUE IOHEXOL 350 MG/ML SOLN COMPARISON:  MRI head and MRA head 06/07/2020 FINDINGS: Aortic arch: Atherosclerotic disease aortic arch and proximal great vessels. Proximal great vessels are adequately patent. Mild stenosis in the innominate artery. Right carotid system: Calcified and noncalcified plaque right carotid bifurcation without significant stenosis or thrombus. Left carotid system: Mild atherosclerotic disease left common carotid artery without significant stenosis. Mild atherosclerotic disease left carotid bifurcation without significant stenosis or thrombus. Vertebral arteries: Right vertebral artery has 2 separate origins from the right subclavian artery. These join at the C4-5 level. No significant right vertebral artery stenosis. Right PICA patent. Right AICA patent. Mild stenosis at the origin of the left vertebral artery. Left vertebral artery is then patent to the basilar without significant additional stenosis. Left PICA not visualized. Left AICA is patent. Skeleton: Disc and facet degeneration throughout the cervical spine without acute skeletal abnormality. Other neck: No soft tissue mass or adenopathy. Upper chest: Left upper lobe partial collapse/scarring. This is chronic and unchanged from CT chest 04/29/2018 and may be related to prior radiation. IMPRESSION: 1. Atherosclerotic disease of the carotid bifurcation bilaterally without significant carotid stenosis. 2. No significant vertebral artery stenosis. Mild stenosis origin of left vertebral artery. Right PICA patent. Left PICA not visualized. Bilateral AICA patent. 3. Post radiation changes left upper lobe. 4.  Aortic Atherosclerosis (ICD10-I70.0). Electronically Signed   By: Franchot Gallo M.D.   On: 06/08/2020 14:05   CT ANGIO CHEST PE W OR WO  CONTRAST  Result Date: 06/09/2020 CLINICAL DATA:  Shortness of breath. Lung nodule. History of lung cancer. Stroke. EXAM: CT ANGIOGRAPHY CHEST CT ABDOMEN AND PELVIS WITH CONTRAST TECHNIQUE: Multidetector CT imaging of the chest was performed using the standard protocol during bolus administration of intravenous contrast. Multiplanar CT image reconstructions and MIPs were obtained to evaluate the vascular anatomy. Multidetector CT imaging of the abdomen and pelvis was performed using the standard protocol during bolus administration of intravenous contrast. CONTRAST:  61mL OMNIPAQUE IOHEXOL 350 MG/ML SOLN COMPARISON:  Most recent chest CT 10/29/2018. FINDINGS: CTA CHEST FINDINGS Cardiovascular: There are no filling defects within the pulmonary arteries to suggest pulmonary embolus. The left upper lobe pulmonary arteries are tree attic. Atherosclerosis of the thoracic aorta. Limited aortic assessment given phase of contrast tailored to pulmonary artery evaluation. The heart is normal in size. Coronary artery calcifications. No pericardial effusion. Mediastinum/Nodes: No enlarged mediastinal or hilar lymph nodes. Left suprahilar assessment is slightly limited due to chronic consolidation. No visualized thyroid nodule. No esophageal wall thickening. Lungs/Pleura: Prior right upper lobectomy. 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 This is stable from 2019. Medial segment of the left lower lobe demonstrates areas of irregular scarring and bronchiectasis. Some subpleural nodularity in this region measures 14 x 10 mm, similar to prior. There is subpleural nodule in the left lower lobe mean diameter 5 mm that is new/increased in size from prior exam, series 6, image 65. Patchy subpleural ground-glass in the anterior right middle lobe appears  chronic and may be post treatment related change. Subpleural area of consolidation in the medial right lower lobe has increased in  size and density currently measuring approximately 2.3 x 1.3 cm, series 6, image 65 there is internal bronchogram. Previously there was ground-glass opacity in this region. 3 mm nodule in the central right lung, series 6, image 53 not definitively seen on prior. Underlying emphysema. Trace right pleural thickening dependently. No significant pleural effusion. Underlying trachea and central bronchi are patent. Musculoskeletal: Postsurgical change in the right hemithorax. No evidence of focal bone lesion. Chronic thinning of left anterior ribs likely post treatment related. Review of the MIP images confirms the above findings. CT ABDOMEN and PELVIS FINDINGS Hepatobiliary: No focal liver abnormality is seen. No gallstones, gallbladder wall thickening, or biliary dilatation. Pancreas: Slight prominence of the pancreatic duct without significant ductal dilatation. No peripancreatic inflammation or evidence of mass. Spleen: Normal in size without focal abnormality. Adrenals/Urinary Tract: No adrenal nodule. Bilateral renal parenchymal thinning and areas of scarring, more prominent on the left. No hydronephrosis. No evidence of focal renal lesion. Symmetric excretion on delayed phase imaging. Urinary bladder is partially distended, equivocal bladder wall thickening. Stomach/Bowel: Stomach is unremarkable. Administered enteric contrast is seen within the distal small bowel and throughout the colon. No obstruction. No bowel inflammation. Normal appendix filled with contrast. Diverticulosis of the descending and sigmoid colon without diverticulitis. Vascular/Lymphatic: Aorto bi-iliac atherosclerosis. No aortic aneurysm. Patent portal vein. No abdominopelvic adenopathy. Reproductive: Status post hysterectomy. No adnexal masses. Other: No ascites. No free air. Small fat containing umbilical hernia. Musculoskeletal: No focal bone lesion. Scoliosis and degenerative change throughout the spine. Degenerative change of both hips.  Review of the MIP images confirms the above findings. IMPRESSION: Chest CTA: 1. No pulmonary embolus. 2. Post right upper lobectomy. 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 3. Previous left lower lobe subpleural nodule in the medial left lung base measures 14 x 10 mm, previously 12 x 9 mm in 2019. Lack of significant interval change favors post treatment related change, however not definitively characterized. 4. Subpleural area of consolidation in the medial right lower lobe has increased in size and density from 2019 when it was ground-glass. This is indeterminate. 5. New/increased 5 mm subpleural nodule in the left lower lobe. 3 mm nodule in the central right lung, not definitively seen on prior exam. 6. PET CT may be helpful for evaluation of metabolic activity of the larger lesions. Smaller nodules are too small for PET characterization. CT abdomen/pelvis: 1. No acute abnormality in the abdomen/pelvis. No evidence of abdominopelvic metastatic disease. 2. Colonic diverticulosis without diverticulitis. Aortic Atherosclerosis (ICD10-I70.0) and Emphysema (ICD10-J43.9). Electronically Signed   By: Keith Rake M.D.   On: 06/09/2020 15:46   CT ABDOMEN PELVIS W CONTRAST  Result Date: 06/09/2020 CLINICAL DATA:  Shortness of breath. Lung nodule. History of lung cancer. Stroke. EXAM: CT ANGIOGRAPHY CHEST CT ABDOMEN AND PELVIS WITH CONTRAST TECHNIQUE: Multidetector CT imaging of the chest was performed using the standard protocol during bolus administration of intravenous contrast. Multiplanar CT image reconstructions and MIPs were obtained to evaluate the vascular anatomy. Multidetector CT imaging of the abdomen and pelvis was performed using the standard protocol during bolus administration of intravenous contrast. CONTRAST:  41mL OMNIPAQUE IOHEXOL 350 MG/ML SOLN COMPARISON:  Most recent chest CT 10/29/2018. FINDINGS: CTA CHEST FINDINGS  Cardiovascular: There are no filling defects within the pulmonary arteries to suggest pulmonary embolus. The left upper  lobe pulmonary arteries are tree attic. Atherosclerosis of the thoracic aorta. Limited aortic assessment given phase of contrast tailored to pulmonary artery evaluation. The heart is normal in size. Coronary artery calcifications. No pericardial effusion. Mediastinum/Nodes: No enlarged mediastinal or hilar lymph nodes. Left suprahilar assessment is slightly limited due to chronic consolidation. No visualized thyroid nodule. No esophageal wall thickening. Lungs/Pleura: Prior right upper lobectomy. 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 This is stable from 2019. Medial segment of the left lower lobe demonstrates areas of irregular scarring and bronchiectasis. Some subpleural nodularity in this region measures 14 x 10 mm, similar to prior. There is subpleural nodule in the left lower lobe mean diameter 5 mm that is new/increased in size from prior exam, series 6, image 65. Patchy subpleural ground-glass in the anterior right middle lobe appears chronic and may be post treatment related change. Subpleural area of consolidation in the medial right lower lobe has increased in size and density currently measuring approximately 2.3 x 1.3 cm, series 6, image 65 there is internal bronchogram. Previously there was ground-glass opacity in this region. 3 mm nodule in the central right lung, series 6, image 53 not definitively seen on prior. Underlying emphysema. Trace right pleural thickening dependently. No significant pleural effusion. Underlying trachea and central bronchi are patent. Musculoskeletal: Postsurgical change in the right hemithorax. No evidence of focal bone lesion. Chronic thinning of left anterior ribs likely post treatment related. Review of the MIP images confirms the above findings. CT ABDOMEN and PELVIS FINDINGS Hepatobiliary: No focal  liver abnormality is seen. No gallstones, gallbladder wall thickening, or biliary dilatation. Pancreas: Slight prominence of the pancreatic duct without significant ductal dilatation. No peripancreatic inflammation or evidence of mass. Spleen: Normal in size without focal abnormality. Adrenals/Urinary Tract: No adrenal nodule. Bilateral renal parenchymal thinning and areas of scarring, more prominent on the left. No hydronephrosis. No evidence of focal renal lesion. Symmetric excretion on delayed phase imaging. Urinary bladder is partially distended, equivocal bladder wall thickening. Stomach/Bowel: Stomach is unremarkable. Administered enteric contrast is seen within the distal small bowel and throughout the colon. No obstruction. No bowel inflammation. Normal appendix filled with contrast. Diverticulosis of the descending and sigmoid colon without diverticulitis. Vascular/Lymphatic: Aorto bi-iliac atherosclerosis. No aortic aneurysm. Patent portal vein. No abdominopelvic adenopathy. Reproductive: Status post hysterectomy. No adnexal masses. Other: No ascites. No free air. Small fat containing umbilical hernia. Musculoskeletal: No focal bone lesion. Scoliosis and degenerative change throughout the spine. Degenerative change of both hips. Review of the MIP images confirms the above findings. IMPRESSION: Chest CTA: 1. No pulmonary embolus. 2. Post right upper lobectomy. 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 3. Previous left lower lobe subpleural nodule in the medial left lung base measures 14 x 10 mm, previously 12 x 9 mm in 2019. Lack of significant interval change favors post treatment related change, however not definitively characterized. 4. Subpleural area of consolidation in the medial right lower lobe has increased in size and density from 2019 when it was ground-glass. This is indeterminate. 5. New/increased 5 mm subpleural nodule in the  left lower lobe. 3 mm nodule in the central right lung, not definitively seen on prior exam. 6. PET CT may be helpful for evaluation of metabolic activity of the larger lesions. Smaller nodules are too small for PET characterization. CT abdomen/pelvis: 1. No acute abnormality in the abdomen/pelvis. No evidence of abdominopelvic metastatic disease. 2. Colonic  diverticulosis without diverticulitis. Aortic Atherosclerosis (ICD10-I70.0) and Emphysema (ICD10-J43.9). Electronically Signed   By: Keith Rake M.D.   On: 06/09/2020 15:46   ECHOCARDIOGRAM COMPLETE  Result Date: 06/08/2020    ECHOCARDIOGRAM REPORT   Patient Name:   Valerie Young Date of Exam: 06/08/2020 Medical Rec #:  [id number]      Height:       64.0 in Accession #:    [id number]     Weight:       123.6 lb Date of Birth:  1929-02-25      BSA:          1.594 m Patient Age:    26 years       BP:           151/64 mmHg Patient Gender: F              HR:           72 bpm. Exam Location:  Inpatient Procedure: 2D Echo, Cardiac Doppler and Color Doppler Indications:    CVA  History:        Patient has prior history of Echocardiogram examinations, most                 recent 11/23/2013. COPD; Risk Factors:Hypertension and                 Dyslipidemia. Lung cancer, CKD.  Sonographer:    Dustin Flock Referring Phys: [place]  1. Left ventricular ejection fraction, by estimation, is 60 to 65%. The left ventricle has normal function. The left ventricle has no regional wall motion abnormalities. There is mild asymmetric left ventricular hypertrophy of the basal-septal segment. Left ventricular diastolic parameters were normal.  2. Right ventricular systolic function is normal. The right ventricular size is normal. There is normal pulmonary artery systolic pressure.  3. The mitral valve is normal in structure. Trivial mitral valve regurgitation.  4. The aortic valve is tricuspid. Aortic valve regurgitation is not visualized. No aortic  stenosis is present.  5. The inferior vena cava is normal in size with greater than 50% respiratory variability, suggesting right atrial pressure of 3 mmHg. FINDINGS  Left Ventricle: Left ventricular ejection fraction, by estimation, is 60 to 65%. The left ventricle has normal function. The left ventricle has no regional wall motion abnormalities. The left ventricular internal cavity size was normal in size. There is  mild asymmetric left ventricular hypertrophy of the basal-septal segment. Left ventricular diastolic parameters were normal. Right Ventricle: The right ventricular size is normal. Right vetricular wall thickness was not assessed. Right ventricular systolic function is normal. There is normal pulmonary artery systolic pressure. The tricuspid regurgitant velocity is 2.03 m/s, and with an assumed right atrial pressure of 3 mmHg, the estimated right ventricular systolic pressure is 27.7 mmHg. Left Atrium: Left atrial size was normal in size. Right Atrium: Right atrial size was normal in size. Pericardium: There is no evidence of pericardial effusion. Mitral Valve: The mitral valve is normal in structure. Trivial mitral valve regurgitation. Tricuspid Valve: The tricuspid valve is normal in structure. Tricuspid valve regurgitation is trivial. Aortic Valve: The aortic valve is tricuspid. Aortic valve regurgitation is not visualized. No aortic stenosis is present. Pulmonic Valve: The pulmonic valve was not well visualized. Pulmonic valve regurgitation is trivial. Aorta: The aortic root and ascending aorta are structurally normal, with no evidence of dilitation. Venous: The inferior vena cava is normal in size with greater than 50% respiratory variability,  suggesting right atrial pressure of 3 mmHg. IAS/Shunts: The interatrial septum was not well visualized.  LEFT VENTRICLE PLAX 2D LVIDd:         3.70 cm  Diastology LVIDs:         2.70 cm  LV e' lateral:   11.30 cm/s LV PW:         1.10 cm  LV E/e' lateral: 7.8  LV IVS:        1.20 cm  LV e' medial:    7.18 cm/s LVOT diam:     1.80 cm  LV E/e' medial:  12.2 LV SV:         47 LV SV Index:   30 LVOT Area:     2.54 cm  RIGHT VENTRICLE RV Basal diam:  2.50 cm RV S prime:     12.70 cm/s TAPSE (M-mode): 2.0 cm LEFT ATRIUM             Index       RIGHT ATRIUM           Index LA diam:        2.90 cm 1.82 cm/m  RA Area:     10.50 cm LA Vol (A2C):   28.2 ml 17.69 ml/m RA Volume:   23.70 ml  14.86 ml/m LA Vol (A4C):   30.3 ml 19.00 ml/m LA Biplane Vol: 31.1 ml 19.50 ml/m  AORTIC VALVE LVOT Vmax:   84.90 cm/s LVOT Vmean:  54.100 cm/s LVOT VTI:    0.186 m  AORTA Ao Root diam: 2.70 cm MITRAL VALVE                TRICUSPID VALVE MV Area (PHT): 4.15 cm     TR Peak grad:   16.5 mmHg MV Decel Time: 183 msec     TR Vmax:        203.00 cm/s MV E velocity: 87.80 cm/s MV A velocity: 103.00 cm/s  SHUNTS MV E/A ratio:  0.85         Systemic VTI:  0.19 m                             Systemic Diam: 1.80 cm Oswaldo Milian MD Electronically signed by Oswaldo Milian MD Signature Date/Time: 06/08/2020/9:11:01 PM    Final    VAS [place] LOWER EXTREMITY VENOUS (DVT)  Result Date: 06/08/2020  Lower Venous DVTStudy Indications: Stroke.  Comparison Study: no prior Performing Technologist: Abram Sander RVS  Examination Guidelines: A complete evaluation includes B-mode imaging, spectral Doppler, color Doppler, and power Doppler as needed of all accessible portions of each vessel. Bilateral testing is considered an integral part of a complete examination. Limited examinations for reoccurring indications may be performed as noted. The reflux portion of the exam is performed with the patient in reverse Trendelenburg.  +---------+---------------+---------+-----------+----------+--------------+ RIGHT    CompressibilityPhasicitySpontaneityPropertiesThrombus Aging +---------+---------------+---------+-----------+----------+--------------+ CFV      Full           Yes      Yes                                  +---------+---------------+---------+-----------+----------+--------------+ SFJ      Full                                                        +---------+---------------+---------+-----------+----------+--------------+  FV Prox  Full                                                        +---------+---------------+---------+-----------+----------+--------------+ FV Mid   Full                                                        +---------+---------------+---------+-----------+----------+--------------+ FV DistalFull                                                        +---------+---------------+---------+-----------+----------+--------------+ PFV      Full                                                        +---------+---------------+---------+-----------+----------+--------------+ POP      Full           Yes      Yes                                 +---------+---------------+---------+-----------+----------+--------------+ PTV      Full                                                        +---------+---------------+---------+-----------+----------+--------------+ PERO     Full                                                        +---------+---------------+---------+-----------+----------+--------------+   +---------+---------------+---------+-----------+----------+--------------+ LEFT     CompressibilityPhasicitySpontaneityPropertiesThrombus Aging +---------+---------------+---------+-----------+----------+--------------+ CFV      Full           Yes      Yes                                 +---------+---------------+---------+-----------+----------+--------------+ SFJ      Full                                                        +---------+---------------+---------+-----------+----------+--------------+ FV Prox  Full                                                         +---------+---------------+---------+-----------+----------+--------------+  FV Mid   Full                                                        +---------+---------------+---------+-----------+----------+--------------+ FV DistalFull                                                        +---------+---------------+---------+-----------+----------+--------------+ PFV      Full                                                        +---------+---------------+---------+-----------+----------+--------------+ POP      Full           Yes      Yes                                 +---------+---------------+---------+-----------+----------+--------------+ PTV      Full                                                        +---------+---------------+---------+-----------+----------+--------------+ PERO     Full                                                        +---------+---------------+---------+-----------+----------+--------------+     Summary: BILATERAL: - No evidence of deep vein thrombosis seen in the lower extremities, bilaterally. -No evidence of popliteal cyst, bilaterally.   *See table(s) above for measurements and observations. Electronically signed by Monica Martinez MD on 06/08/2020 at 3:50:31 PM.    Final    Scheduled Meds: .  stroke: mapping our early stages of recovery book   Does not apply Once  . aspirin  300 mg Rectal Daily   Or  . aspirin  325 mg Oral Daily  . atenolol  50 mg Oral Daily  . cephALEXin  500 mg Oral Q12H  . enoxaparin (LOVENOX) injection  30 mg Subcutaneous Q24H  . levothyroxine  50 mcg Oral QAC breakfast  . phosphorus  500 mg Oral BID  . pravastatin  40 mg Oral Daily   Continuous Infusions:   LOS: 2 days   Kerney Elbe, DO [facility] PAGER is on AMION  If 7PM-7AM, please contact night-coverage [email or web address]

## 2020-06-09 NOTE — TOC Initial Note (Signed)
Transition of Care [facility]) - Initial/Assessment Note    Patient Details  Name: Valerie Young MRN: [id number] Date of Birth: 1928-12-14  [facility]) CM/SW Contact:    Emeterio Reeve, [place] Phone Number: 06/09/2020, 2:38 PM  Clinical Narrative:                  CSW met with pt and daughter, Terri at bedside. CSW introduced self and explained her role at the hospital.  Karna Christmas stated that PTA, pt was living alone in her apartment. Terri reports that pt has a walker but rarely uses it and mainly "furniture surfs" around her apartment. Pt lives in a retirement community and was independent and still driving.   CSW reviewed pt/ot reccs of snf. Pt and daughter agreed that would be best for now. Pt has been to rehab in the past but cant remember the name of the facility. Pt and daughter received medicare.gov snf list. Pt and daughter gave CSW to send out to facilities in the area.   CSW will continue to follow.   Expected Discharge Plan: [place] Barriers to Discharge: Continued Medical Work up   Patient Goals and CMS Choice Patient states their goals for this hospitalization and ongoing recovery are:: To go back home to my apt.      Expected Discharge Plan and Services Expected Discharge Plan: [place] arrangements for the past 2 months: Apartment                                      Prior Living Arrangements/Services Living arrangements for the past 2 months: Apartment Lives with:: Self Patient language and need for interpreter reviewed:: Yes Do you feel safe going back to the place where you live?: Yes      Need for Family Participation in Patient Care: Yes (Comment) Care giver support system in place?: Yes (comment) Current home services: DME    Activities of Daily Living      Permission Sought/Granted Permission sought to share information with : Customer service manager, Family  Supports Permission granted to share information with : Yes, Verbal Permission Granted     Permission granted to share info w AGENCY: SNF        Emotional Assessment Appearance:: Appears stated age Attitude/Demeanor/Rapport: Engaged Affect (typically observed): Appropriate Orientation: : Oriented to Self, Oriented to Place, Oriented to  Time, Oriented to Situation Alcohol / Substance Use: Not Applicable Psych Involvement: No (comment)  Admission diagnosis:  Acute gastroenteritis [K52.9] Gastroenteritis [K52.9] Nausea & vomiting [R11.2] Macrocytosis without anemia [D75.89] Nausea and vomiting in adult [R11.2] Elevated serum free T4 level [R79.89] Altered mental status, unspecified altered mental status type [R41.82] Patient Active Problem List   Diagnosis Date Noted  . Cerebral embolism with cerebral infarction 06/08/2020  . Acute gastroenteritis 06/07/2020  . Gastroenteritis 06/07/2020  . Allergic rhinitis 10/21/2017  . Pneumothorax on left 04/09/2017  . Pleural effusion 04/09/2017  . Pneumothorax 04/09/2017  . 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 Neoplasm   . Essential hypertension   . Adenocarcinoma, lung ([place])   . Lung cancer ([place]) 10/02/2015  . Hypothyroidism 10/02/2015  . Arthritis of knee 01/06/2014  . Essential hypertension, benign 01/06/2014  . Encounter for therapeutic drug monitoring 12/17/2013  . Pulmonary emboli ([place]) 11/23/2013  . DVT (deep venous thrombosis) (  [place]) 11/23/2013  . COPD (chronic obstructive pulmonary disease) with emphysema ([place]) 11/22/2013  . Acute respiratory failure with hypoxia ([place]) 11/22/2013  . Leukocytosis, unspecified 11/22/2013  . Anemia of chronic disease 11/22/2013  . CAP (community acquired pneumonia) 11/22/2013  . Chronic kidney disease (CKD), stage IV (severe) ([place]) 11/15/2013  . Adenocarcinoma of lung ([place]) 12/11/2012  . Pulmonary nodule 12/03/2012  . HYPOTHYROIDISM 06/19/2007  . HYPERTENSION 06/19/2007    PCP:  Collene Gobble, MD Pharmacy:   [id number]: [phone]: [phone]    Social Determinants of Health (SDOH) Interventions    Readmission Risk Interventions No flowsheet data found.  MEmeterio Reeve LLatanya Presser LSummitSocial Worker [phone]

## 2020-06-09 NOTE — Progress Notes (Signed)
  Speech Language Pathology Treatment: Dysphagia  Patient Details Name: Valerie Young MRN: [id number] DOB: December 02, 1928 Today's Date: 06/09/2020 Time: 2878-6767 SLP Time Calculation (min) (ACUTE ONLY): 13 min  Assessment / Plan / Recommendation Clinical Impression  Increased lethargy- up in chair following PT/OT session. Pt able to take pills with liquids prior without difficulties per  RN but presently holding 6 pills in anterior cavity with RN when therapist arrived. Facilitated intake with single pill presentation in applesauce which she orally cleared with intermittent report of globus sensation. She did not cough, throat clear or have wet vocal quality with thin, puree or solid. ST will downgrade consistency to Dys 3 (chopped meats), continue thin, pills whole in puree. Her waxing and waning alertness and stroke increased aspiration risk. ST will continue to follow.    HPI HPI: 86 y.o. female with medical history significant of hypertension, hyperlipidemia, chronic kidney disease stage III with baseline creatinine 1.4, hypothyroidism, history of lung adenocarcinoma who arrives to ED on 7/13 with confusion, n/v. MRI head revelas multiple ischemic CVAs involving L PCA and R MLF pathway; brain regions including L hippocampus, L occipital lobe, L dorsal thalamus, bilateral cerebellar, and R midbrain. 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 No acute cardiopulmonary disease      SLP Plan  Continue with current plan of care       Recommendations  Diet recommendations: Regular;Thin liquid Liquids provided via: Cup;Straw Medication Administration: Whole meds with puree Supervision: Staff to assist with self feeding;Full supervision/cueing for compensatory strategies Compensations: Slow rate;Small sips/bites;Minimize environmental distractions;Other (Comment) Postural Changes and/or Swallow Maneuvers: Seated  upright 90 degrees                Oral Care Recommendations: Oral care BID Follow up Recommendations: 24 hour supervision/assistance;Inpatient Rehab SLP Visit Diagnosis: Dysphagia, unspecified (R13.10) Plan: Continue with current plan of care                       Houston Siren 06/09/2020, 9:50 AM  Orbie Pyo Colvin Caroli.Ed Risk analyst [phone] Office [phone]

## 2020-06-09 NOTE — Progress Notes (Signed)
Patient has consumed both bottles of contrast.

## 2020-06-09 NOTE — Progress Notes (Signed)
Physical Therapy Treatment Patient Details Name: Valerie Young MRN: [id number] DOB: 08/26/1929 Today's Date: 06/09/2020    History of Present Illness 86 y.o. female with medical history significant of hypertension, hyperlipidemia, chronic kidney disease stage III with baseline creatinine 1.4, hypothyroidism, history of lung adenocarcinoma who arrives to ED on 7/13 with confusion, n/v. MRI head revelas multiple ischemic CVAs involving L PCA and R MLF pathway; brain regions including L hippocampus, L occipital lobe, L dorsal thalamus, bilateral cerebellar, and R midbrain.    PT Comments    Pt very lethargic today, responding to pt monosyllabically and with minimal eye opening during session. Pt still following commands during session, tolerating repeated transfer training aided by use of stedy for maximizing pt engagement in pull to stand. Unable to progress to gait this day due to pt arousal level, will continue to progress mobility as able. Post-acute plan remains appropriate.     Follow Up Recommendations  SNF;Supervision/Assistance - 24 hour     Equipment Recommendations  None recommended by PT    Recommendations for Other Services       Precautions / Restrictions Precautions Precautions: Fall Precaution Comments: R inattention, visual deficits Restrictions Weight Bearing Restrictions: No    Mobility  Bed Mobility Overal bed mobility: Needs Assistance             General bed mobility comments: up in recliner on PT arrival to room, left in recliner upon PT exit  Transfers Overall transfer level: Needs assistance Equipment used: 1 person hand held assist;Ambulation equipment used Transfers: Sit to/from Stand Sit to Stand: Mod assist         General transfer comment: Mod assist for power up, elevating trunk, and steadying. STS x4, x3 with use of stedy for pt to have bilateral UE pulling ability and to maximize pt engagement. Standing tolerance x10  seconds.  Ambulation/Gait                 Stairs             Wheelchair Mobility    Modified Rankin (Stroke Patients Only) Modified Rankin (Stroke Patients Only) Pre-Morbid Rankin Score: Slight disability Modified Rankin: Moderately severe disability     Balance Overall balance assessment: Needs assistance Sitting-balance support: No upper extremity supported;Feet supported Sitting balance-Leahy Scale: Fair Sitting balance - Comments: able to sit briefly with hands in lap before fatiguing Postural control: Left lateral lean Standing balance support: During functional activity;Single extremity supported Standing balance-Leahy Scale: Poor Standing balance comment: reliant on external support                            Cognition Arousal/Alertness: Lethargic Behavior During Therapy: Flat affect Overall Cognitive Status: Impaired/Different from baseline Area of Impairment: Orientation;Attention;Memory;Following commands;Safety/judgement;Problem solving;Awareness                 Orientation Level: Place;Time;Situation Current Attention Level: Sustained Memory: Decreased short-term memory Following Commands: Follows one step commands with increased time;Follows one step commands inconsistently Safety/Judgement: Decreased awareness of deficits;Decreased awareness of safety Awareness: Intellectual Problem Solving: Slow processing;Decreased initiation;Difficulty sequencing;Requires verbal cues;Requires tactile cues General Comments: Pt lethargic, with minimal eye opening but follows commands ~50% of the time. Pt states she is at home, PT oriented pt to hospital.      Exercises General Exercises - Lower Extremity Ankle Circles/Pumps: Both;10 reps;AAROM;Seated Long Arc Quad: AAROM;Both;5 reps;Seated    General Comments        Pertinent  Vitals/Pain Pain Assessment: No/denies pain Faces Pain Scale: No hurt Pain Intervention(s): Monitored during  session    Home Living     Available Help at Discharge: Family;Available PRN/intermittently Type of Home: House              Prior Function            PT Goals (current goals can now be found in the care plan section) Acute Rehab PT Goals Patient Stated Goal: none explicitly stated PT Goal Formulation: With patient/family Time For Goal Achievement: 06/22/20 Potential to Achieve Goals: Fair Progress towards PT goals: Progressing toward goals    Frequency    Min 3X/week      PT Plan Current plan remains appropriate    Co-evaluation              AM-PAC PT "6 Clicks" Mobility   Outcome Measure  Help needed turning from your back to your side while in a flat bed without using bedrails?: A Lot Help needed moving from lying on your back to sitting on the side of a flat bed without using bedrails?: A Lot Help needed moving to and from a bed to a chair (including a wheelchair)?: A Lot Help needed standing up from a chair using your arms (e.g., wheelchair or bedside chair)?: A Lot Help needed to walk in hospital room?: Total Help needed climbing 3-5 steps with a railing? : Total 6 Click Score: 10    End of Session Equipment Utilized During Treatment: Gait belt Activity Tolerance: Patient limited by fatigue Patient left: in bed;with bed alarm set;with call bell/phone within reach;with nursing/sitter in room Nurse Communication: Mobility status PT Visit Diagnosis: Other symptoms and signs involving the nervous system (R29.898)     Time: 1030-1050 PT Time Calculation (min) (ACUTE ONLY): 20 min  Charges:  $Therapeutic Activity: 8-22 mins                    Tiyana Galla E, PT Acute Rehabilitation Services Pager [phone]  Office [phone]    Tanji Storrs D Elonda Husky 06/09/2020, 12:28 PM

## 2020-06-09 NOTE — Plan of Care (Signed)
  Problem: Education: Goal: Knowledge of disease or condition will improve Outcome: Progressing Goal: Knowledge of secondary prevention will improve Outcome: Progressing Goal: Knowledge of patient specific risk factors addressed and post discharge goals established will improve Outcome: Progressing   Problem: Coping: Goal: Will verbalize positive feelings about self Outcome: Progressing Goal: Will identify appropriate support needs Outcome: Progressing   Problem: Health Behavior/Discharge Planning: Goal: Ability to manage health-related needs will improve Outcome: Progressing   Problem: Nutrition: Goal: Dietary intake will improve Outcome: Progressing   Problem: Ischemic Stroke/TIA Tissue Perfusion: Goal: Complications of ischemic stroke/TIA will be minimized Outcome: Progressing

## 2020-06-09 NOTE — Progress Notes (Addendum)
STROKE TEAM PROGRESS NOTE   INTERVAL HISTORY Daughter is at bedside. Pt awake alert, sitting in chair, was talking with family over the phone. No acute event overnight. Pan CT still pending. If unremarkable, may recommend loop recorder. Pt and daughter understood plan.  PT/OT recommend SNF  Vitals:   06/07/20 2202 06/08/20 0746 06/08/20 2148 06/09/20 0742  BP: (!) 166/65 (!) 151/64 (!) 155/66 (!) 191/76  Pulse: 68 72 68 65  Resp: 20 14 16 18   Temp: 98.8 F (37.1 C) 98.4 F (36.9 C) 98.2 F (36.8 C) 97.6 F (36.4 C)  TempSrc: Oral  Oral   SpO2: 95% 96% 97% 98%   CBC:  Recent Labs  Lab 06/08/20 0251 06/09/20 0148  WBC 11.1* 10.6*  NEUTROABS 8.1* 7.0  HGB 13.1 13.3  HCT 41.3 42.0  MCV 101.7* 100.2*  PLT 217 540   Basic Metabolic Panel:  Recent Labs  Lab 06/08/20 0251 06/09/20 0148  NA 138 138  K 4.2 4.0  CL 103 105  CO2 26 23  GLUCOSE 100* 92  BUN 18 20  CREATININE 1.18* 1.12*  CALCIUM 9.5 9.4  MG 2.2 2.1  PHOS 2.7 2.2*   Lipid Panel:  Recent Labs  Lab 06/08/20 0251  CHOL 141  TRIG 82  HDL 55  CHOLHDL 2.6  VLDL 16  LDLCALC 70   HgbA1c:  Recent Labs  Lab 06/08/20 0251  HGBA1C 5.9*   Urine Drug Screen: No results for input(s): LABOPIA, COCAINSCRNUR, LABBENZ, AMPHETMU, THCU, LABBARB in the last 168 hours.  Alcohol Level  Recent Labs  Lab 06/06/20 2217  ETH <10    IMAGING past 24 hours CT ANGIO NECK W OR WO CONTRAST  Result Date: 06/08/2020 CLINICAL DATA:  Stroke. EXAM: CT ANGIOGRAPHY NECK TECHNIQUE: Multidetector CT imaging of the neck was performed using the standard protocol during bolus administration of intravenous contrast. Multiplanar CT image reconstructions and MIPs were obtained to evaluate the vascular anatomy. Carotid stenosis measurements (when applicable) are obtained utilizing NASCET criteria, using the distal internal carotid diameter as the denominator. CONTRAST:  25mL OMNIPAQUE IOHEXOL 350 MG/ML SOLN COMPARISON:  MRI head and MRA  head 06/07/2020 FINDINGS: Aortic arch: Atherosclerotic disease aortic arch and proximal great vessels. Proximal great vessels are adequately patent. Mild stenosis in the innominate artery. Right carotid system: Calcified and noncalcified plaque right carotid bifurcation without significant stenosis or thrombus. Left carotid system: Mild atherosclerotic disease left common carotid artery without significant stenosis. Mild atherosclerotic disease left carotid bifurcation without significant stenosis or thrombus. Vertebral arteries: Right vertebral artery has 2 separate origins from the right subclavian artery. These join at the C4-5 level. No significant right vertebral artery stenosis. Right PICA patent. Right AICA patent. Mild stenosis at the origin of the left vertebral artery. Left vertebral artery is then patent to the basilar without significant additional stenosis. Left PICA not visualized. Left AICA is patent. Skeleton: Disc and facet degeneration throughout the cervical spine without acute skeletal abnormality. Other neck: No soft tissue mass or adenopathy. Upper chest: Left upper lobe partial collapse/scarring. This is chronic and unchanged from CT chest 04/29/2018 and may be related to prior radiation. IMPRESSION: 1. Atherosclerotic disease of the carotid bifurcation bilaterally without significant carotid stenosis. 2. No significant vertebral artery stenosis. Mild stenosis origin of left vertebral artery. Right PICA patent. Left PICA not visualized. Bilateral AICA patent. 3. Post radiation changes left upper lobe. 4.  Aortic Atherosclerosis (ICD10-I70.0). Electronically Signed   By: Franchot Gallo M.D.   On:  06/08/2020 14:05   ECHOCARDIOGRAM COMPLETE  Result Date: 06/08/2020    ECHOCARDIOGRAM REPORT   Patient Name:   Valerie Young Date of Exam: 06/08/2020 Medical Rec #:  [id number]      Height:       64.0 in Accession #:    [id number]     Weight:       123.6 lb Date of Birth:  [birth year]      BSA:           1.594 m Patient Age:    86 years       BP:           151/64 mmHg Patient Gender: F              HR:           72 bpm. Exam Location:  Inpatient Procedure: 2D Echo, Cardiac Doppler and Color Doppler Indications:    CVA  History:        Patient has prior history of Echocardiogram examinations, most                 recent 11/23/2013. COPD; Risk Factors:Hypertension and                 Dyslipidemia. Lung cancer, CKD.  Sonographer:    Dustin Flock Referring Phys: [place]  1. Left ventricular ejection fraction, by estimation, is 60 to 65%. The left ventricle has normal function. The left ventricle has no regional wall motion abnormalities. There is mild asymmetric left ventricular hypertrophy of the basal-septal segment. Left ventricular diastolic parameters were normal.  2. Right ventricular systolic function is normal. The right ventricular size is normal. There is normal pulmonary artery systolic pressure.  3. The mitral valve is normal in structure. Trivial mitral valve regurgitation.  4. The aortic valve is tricuspid. Aortic valve regurgitation is not visualized. No aortic stenosis is present.  5. The inferior vena cava is normal in size with greater than 50% respiratory variability, suggesting right atrial pressure of 3 mmHg. FINDINGS  Left Ventricle: Left ventricular ejection fraction, by estimation, is 60 to 65%. The left ventricle has normal function. The left ventricle has no regional wall motion abnormalities. The left ventricular internal cavity size was normal in size. There is  mild asymmetric left ventricular hypertrophy of the basal-septal segment. Left ventricular diastolic parameters were normal. Right Ventricle: The right ventricular size is normal. Right vetricular wall thickness was not assessed. Right ventricular systolic function is normal. There is normal pulmonary artery systolic pressure. The tricuspid regurgitant velocity is 2.03 m/s, and with an assumed right  atrial pressure of 3 mmHg, the estimated right ventricular systolic pressure is 98.3 mmHg. Left Atrium: Left atrial size was normal in size. Right Atrium: Right atrial size was normal in size. Pericardium: There is no evidence of pericardial effusion. Mitral Valve: The mitral valve is normal in structure. Trivial mitral valve regurgitation. Tricuspid Valve: The tricuspid valve is normal in structure. Tricuspid valve regurgitation is trivial. Aortic Valve: The aortic valve is tricuspid. Aortic valve regurgitation is not visualized. No aortic stenosis is present. Pulmonic Valve: The pulmonic valve was not well visualized. Pulmonic valve regurgitation is trivial. Aorta: The aortic root and ascending aorta are structurally normal, with no evidence of dilitation. Venous: The inferior vena cava is normal in size with greater than 50% respiratory variability, suggesting right atrial pressure of 3 mmHg. IAS/Shunts: The interatrial septum was not well visualized.  LEFT VENTRICLE PLAX 2D  LVIDd:         3.70 cm  Diastology LVIDs:         2.70 cm  LV e' lateral:   11.30 cm/s LV PW:         1.10 cm  LV E/e' lateral: 7.8 LV IVS:        1.20 cm  LV e' medial:    7.18 cm/s LVOT diam:     1.80 cm  LV E/e' medial:  12.2 LV SV:         47 LV SV Index:   30 LVOT Area:     2.54 cm  RIGHT VENTRICLE RV Basal diam:  2.50 cm RV S prime:     12.70 cm/s TAPSE (M-mode): 2.0 cm LEFT ATRIUM             Index       RIGHT ATRIUM           Index LA diam:        2.90 cm 1.82 cm/m  RA Area:     10.50 cm LA Vol (A2C):   28.2 ml 17.69 ml/m RA Volume:   23.70 ml  14.86 ml/m LA Vol (A4C):   30.3 ml 19.00 ml/m LA Biplane Vol: 31.1 ml 19.50 ml/m  AORTIC VALVE LVOT Vmax:   84.90 cm/s LVOT Vmean:  54.100 cm/s LVOT VTI:    0.186 m  AORTA Ao Root diam: 2.70 cm MITRAL VALVE                TRICUSPID VALVE MV Area (PHT): 4.15 cm     TR Peak grad:   16.5 mmHg MV Decel Time: 183 msec     TR Vmax:        203.00 cm/s MV E velocity: 87.80 cm/s MV A velocity:  103.00 cm/s  SHUNTS MV E/A ratio:  0.85         Systemic VTI:  0.19 m                             Systemic Diam: 1.80 cm Oswaldo Milian MD Electronically signed by Oswaldo Milian MD Signature Date/Time: 06/08/2020/9:11:01 PM    Final    VAS [place] LOWER EXTREMITY VENOUS (DVT)  Result Date: 06/08/2020  Lower Venous DVTStudy Indications: Stroke.  Comparison Study: no prior Performing Technologist: Abram Sander RVS  Examination Guidelines: A complete evaluation includes B-mode imaging, spectral Doppler, color Doppler, and power Doppler as needed of all accessible portions of each vessel. Bilateral testing is considered an integral part of a complete examination. Limited examinations for reoccurring indications may be performed as noted. The reflux portion of the exam is performed with the patient in reverse Trendelenburg.  +---------+---------------+---------+-----------+----------+--------------+ RIGHT    CompressibilityPhasicitySpontaneityPropertiesThrombus Aging +---------+---------------+---------+-----------+----------+--------------+ CFV      Full           Yes      Yes                                 +---------+---------------+---------+-----------+----------+--------------+ SFJ      Full                                                        +---------+---------------+---------+-----------+----------+--------------+  FV Prox  Full                                                        +---------+---------------+---------+-----------+----------+--------------+ FV Mid   Full                                                        +---------+---------------+---------+-----------+----------+--------------+ FV DistalFull                                                        +---------+---------------+---------+-----------+----------+--------------+ PFV      Full                                                         +---------+---------------+---------+-----------+----------+--------------+ POP      Full           Yes      Yes                                 +---------+---------------+---------+-----------+----------+--------------+ PTV      Full                                                        +---------+---------------+---------+-----------+----------+--------------+ PERO     Full                                                        +---------+---------------+---------+-----------+----------+--------------+   +---------+---------------+---------+-----------+----------+--------------+ LEFT     CompressibilityPhasicitySpontaneityPropertiesThrombus Aging +---------+---------------+---------+-----------+----------+--------------+ CFV      Full           Yes      Yes                                 +---------+---------------+---------+-----------+----------+--------------+ SFJ      Full                                                        +---------+---------------+---------+-----------+----------+--------------+ FV Prox  Full                                                        +---------+---------------+---------+-----------+----------+--------------+  FV Mid   Full                                                        +---------+---------------+---------+-----------+----------+--------------+ FV DistalFull                                                        +---------+---------------+---------+-----------+----------+--------------+ PFV      Full                                                        +---------+---------------+---------+-----------+----------+--------------+ POP      Full           Yes      Yes                                 +---------+---------------+---------+-----------+----------+--------------+ PTV      Full                                                         +---------+---------------+---------+-----------+----------+--------------+ PERO     Full                                                        +---------+---------------+---------+-----------+----------+--------------+     Summary: BILATERAL: - No evidence of deep vein thrombosis seen in the lower extremities, bilaterally. -No evidence of popliteal cyst, bilaterally.   *See table(s) above for measurements and observations. Electronically signed by Monica Martinez MD on 06/08/2020 at 3:50:31 PM.    Final     PHYSICAL EXAM  Temp:  [97.6 F (36.4 C)-98.2 F (36.8 C)] 97.6 F (36.4 C) (07/16 0742) Pulse Rate:  [65-68] 65 (07/16 0742) Resp:  [16-18] 18 (07/16 0742) BP: (155-191)/(66-76) 191/76 (07/16 0742) SpO2:  [97 %-98 %] 98 % (07/16 0742)  General - cachectic appearance, well developed, in no apparent distress.  Ophthalmologic - fundi not visualized due to noncooperation.  Cardiovascular - Regular rhythm and rate.  Mental Status -  Level of arousal and orientation to place, age and person were intact, however, not orientated to time. Language including expression, naming, repetition, comprehension was assessed and found intact.  Cranial Nerves II - XII - II - right hemianopia. III, IV, VI - Extraocular movements exam showed right INO. V - Facial sensation intact bilaterally. VII - Facial movement intact bilaterally. VIII - Hard of hearing & vestibular intact bilaterally. Unsustained nystagmus of left eye on left gaze X - Palate elevates symmetrically. XI - Chin turning & shoulder shrug intact bilaterally. XII - Tongue protrusion intact.  Motor Strength - The patient's strength was  symmetrical in all extremities and pronator drift was absent.  Bulk was normal and fasciculations were absent.   Motor Tone - Muscle tone was assessed at the neck and appendages and was normal.  Reflexes - The patient's reflexes were symmetrical in all extremities and she had no pathological  reflexes.  Sensory - Light touch, temperature/pinprick were assessed and were symmetrical.    Coordination - The patient had grossly normal movements in the right hand and feet with no ataxia or dysmetria. However, left FTN mild dysmetria.  Tremor was absent.  Gait and Station - deferred.   ASSESSMENT/PLAN Ms. Davin A Palinkas is a 86 y.o. female with history of hypertension, lung cancer, blood clot associated with vein wall inflammation presenting with double vision, L eye nystagmus w/ abduction, nausea, vomiting and epigastric pain.   Stroke: B posterior circulation infarcts in setting of L P2 occlusion,  Embolic secondary to unknown source, suspicious for AF vs. Hypercoagulable state from advanced malignancy  CT head 7/13 No acute abnormality. Small vessel disease. Atrophy.     MRI 7/14  Multifocal L occipital, L hippocampus, dorsal L thalamic, B cerebellar, dorsomedial R mid brain , L superior cerebellar peduncle infarcts. Moderate atrophy. Small vessel disease.   MRA  L PCA occlusion at P2. L PICA poorly seen  CTA neck - left PICA not visualized  2D Echo EF 60-65%  LE venous doppler no DVT  EEG no seizure  If all work up neg, will consider loop recorder to rule out afib  LDL 70  HgbA1c 5.9  VTE prophylaxis - Lovenox 30 mg sq daily   No antithrombotic prior to admission, now on ASA 325mg .    Therapy recommendations:  SNF  Disposition:  pending   Hx of lung cancer  R lung cancer s/p lobectomy in 03/1980  L lung cancer in 2014 S/p LUL SBRT -> scarred  LLL cavitary lesion unchanged in 2019  Bilateral basilar nodules slightly larger in 2019 but pt declined further biopsy  Last follow up with Dr. Lamonte Sakai 10/2018  pan CT pending to rule out advance malignancy   Hx of PE/DVT  Diagnosed in 2014  Was on coumadin 2014-2015  Currently not on [facility]  Not sure if PE/DVT was related to her left lung cancer at that time  This admissio nLE venous doppler no DVT  CTA  chest pending to rule out PE  Hypertensive Urgency  Elevated on admission 169/122  Stable  On amlodipine, atenolol . Gradually decrease BP to goal in 2-3 days . Long-term BP goal normotensive  Hyperlipidemia  Home meds:   pravachol 40, resumed in hospital  LDL 70, goal < 70  Will not do more intensive statin than ordered given advanced age and LDL just at goal  Continue statin at discharge  CKD stage IIIa   Cre 1.29-1.18->1.12  improving   Other Stroke Risk Factors  Advanced age  Former Cigarette smoker, quit 72 yrs ago  Family hx stroke (mother at age 82)  Other Active Problems  acute gastroenteritis  COPD  DJD  Hypothyroidism   Hospital day # 2  Rosalin Hawking, MD PhD Stroke Neurology 06/09/2020 1:27 PM  ADDENDUM:  Pan CT did not show significant malignancy or advanced malignancy. Therefore, hypercoagulable state due to advanced malignancy likely not the case here. Will need loop recorder on Monday to rule out afib. Will inform EP team to perform on Monday. Case discussed with Dr. Alfredia Ferguson.   Neurology will sign off. Please call with questions.  Pt will follow up with stroke clinic Dr. Leonie Man at [facility] in about 4 weeks. Thanks for the consult.  Rosalin Hawking, MD PhD Stroke Neurology 06/09/2020 7:06 PM   To contact Stroke Continuity provider, please refer to [email or web address] After hours, contact General Neurology

## 2020-06-09 NOTE — Evaluation (Signed)
Occupational Therapy Evaluation Patient Details Name: Valerie Young MRN: [id number] DOB: December 01, 1928 Today's Date: 06/09/2020    History of Present Illness 86 y.o. female with medical history significant of hypertension, hyperlipidemia, chronic kidney disease stage III with baseline creatinine 1.4, hypothyroidism, history of lung adenocarcinoma who arrives to ED on 7/13 with confusion, n/v. MRI head revelas multiple ischemic CVAs involving L PCA and R MLF pathway; brain regions including L hippocampus, L occipital lobe, L dorsal thalamus, bilateral cerebellar, and R midbrain.   Clinical Impression   Pt presents with diagnoses above and deficits in strength, vision, coordination, proprioception, endurance, and sitting/standing balance. Pt overall Max A for bed mobility and transfer to recliner chair with manual assist to block B knees. Pt with significant lethargy during session and preference for L sided gaze, requiring significant cues to correct. Pt requires Min A to Max A for UB ADLs and Total A for LB ADLs due to deficits. Plan to further assess vision and coordination, as well as build strength during ADLs.     Follow Up Recommendations  SNF;Supervision/Assistance - 24 hour    Equipment Recommendations  3 in 1 bedside commode    Recommendations for Other Services       Precautions / Restrictions Precautions Precautions: Fall Precaution Comments: R inattention, visual deficits Restrictions Weight Bearing Restrictions: No      Mobility Bed Mobility Overal bed mobility: Needs Assistance Bed Mobility: Supine to Sit     Supine to sit: Max assist;HOB elevated     General bed mobility comments: Max A to sit EOB, once R hand reaching across body and placed on bedrail, pt able to sustain grasp.   Transfers Overall transfer level: Needs assistance Equipment used: None;1 person hand held assist Transfers: Sit to/from [facility] Sit to Stand: Mod assist Stand  pivot transfers: Max assist       General transfer comment: Mod A for power up and to maintain standing balance. Therapist completed manual transfer with B knee blocking to maximize safety during pivot to chair    Balance Overall balance assessment: Needs assistance Sitting-balance support: No upper extremity supported;Feet supported Sitting balance-Leahy Scale: Poor Sitting balance - Comments: Pt reliant on B UE support to maintain sitting balance, demonstrates ability to catch self with leaning to L  Postural control: Left lateral lean Standing balance support: During functional activity;Single extremity supported Standing balance-Leahy Scale: Poor Standing balance comment: reliant on external support                           ADL either performed or assessed with clinical judgement   ADL Overall ADL's : Needs assistance/impaired Eating/Feeding: Minimal assistance;Sitting   Grooming: Minimal assistance;Sitting;Wash/dry face Grooming Details (indicate cue type and reason): Min A for initiation of washing face  Upper Body Bathing: Maximal assistance;Sitting   Lower Body Bathing: Total assistance;Bed level Lower Body Bathing Details (indicate cue type and reason): Total for peri care in bed after urine incontinence Upper Body Dressing : Moderate assistance;Bed level Upper Body Dressing Details (indicate cue type and reason): Mod A to don new hospital gown in bed, cues for sequencing Lower Body Dressing: Total assistance;Bed level Lower Body Dressing Details (indicate cue type and reason): Total A to don socks bed level Toilet Transfer: Maximal assistance;Stand-pivot Toilet Transfer Details (indicate cue type and reason): simulated with transfer to recliner, Max A with B knee blocking due to weakness Toileting- Clothing Manipulation and Hygiene: Total assistance;Bed level  Toileting - Clothing Manipulation Details (indicate cue type and reason): Total A for cleanup after  urine incontinence       General ADL Comments: Pt with significant lethargy during OT eval. Also with deficits in vision, proprioception, coordination, strength, sitting/standing balance and endurance. Due to deficits, pt requires extensive assist for ADLs      Vision Baseline Vision/History: Wears glasses Wears Glasses: At all times Patient Visual Report: Blurring of vision Vision Assessment?: Vision impaired- to be further tested in functional context Additional Comments: Pt with minimal eye opening during eval, but noted with difficulty focusing gaze. Pt with preference for L sided gaze, difficulty reaching midline without cues.     Perception Perception Perception Tested?: Yes Perception Deficits: Inattention/neglect Inattention/Neglect: Does not attend to right visual field;Impaired- to be further tested in functional context Spatial deficits: Preference for L sided gaze, R sided inattention of limbs   Praxis Praxis Praxis tested?: Deficits Deficits: Initiation;Organization;Limb apraxia    Pertinent Vitals/Pain Pain Assessment: No/denies pain     Hand Dominance Right   Extremity/Trunk Assessment Upper Extremity Assessment Upper Extremity Assessment: RUE deficits/detail;LUE deficits/detail;Generalized weakness RUE Deficits / Details: AROM shoulder flexion to about 100*, decreased coordination RUE Sensation: decreased proprioception RUE Coordination: decreased fine motor;decreased gross motor LUE Deficits / Details: active shoulder flexion to about 100*, decreased coordination LUE Sensation: decreased proprioception LUE Coordination: decreased fine motor;decreased gross motor   Lower Extremity Assessment Lower Extremity Assessment: Defer to PT evaluation   Cervical / Trunk Assessment Cervical / Trunk Assessment: Normal   Communication Communication Communication: No difficulties   Cognition Arousal/Alertness: Lethargic Behavior During Therapy: Flat affect Overall  Cognitive Status: Impaired/Different from baseline Area of Impairment: Orientation;Attention;Memory;Following commands;Safety/judgement;Problem solving;Awareness                 Orientation Level: Place;Time;Situation (pt reports July 2023, unsure of where she is) Current Attention Level: Sustained Memory: Decreased short-term memory Following Commands: Follows one step commands with increased time;Follows one step commands inconsistently Safety/Judgement: Decreased awareness of deficits;Decreased awareness of safety Awareness: Intellectual Problem Solving: Slow processing;Decreased initiation;Difficulty sequencing;Requires verbal cues;Requires tactile cues General Comments: Pt reports July 2023, unsure of place. OT educated on location and MD entered at end of session. - pt unable to recall location. Pt with eyes closed majority of session but answers questions after repetition   General Comments       Exercises     Shoulder Instructions      Home Living Family/patient expects to be discharged to:: Private residence Living Arrangements: Alone Available Help at Discharge: Family;Available PRN/intermittently Type of Home: House Home Access: Stairs to enter [employer] of Steps: 4 (per April a curb only)   Home Layout: One level     Bathroom Shower/Tub: Teacher, early years/pre: Standard     Home Equipment: Environmental consultant - 2 wheels;Cane - single point          Prior Functioning/Environment Level of Independence: Needs assistance  Gait / Transfers Assistance Needed: Spoke with daughter April on the phone - per April, pt ambulates with cane ADL's / Homemaking Assistance Needed: pt states her aide helps her cook and eat, per daughter April this is not accurate, no aide but son and daughter Karna Christmas live close to pt if needed. Per April, pt was driving and doing all dressing/bathing/cooking/cleaning at home but bathing less frequently due to significant effort.             OT Problem List: Decreased strength;Decreased activity tolerance;Impaired balance (  sitting and/or standing);Impaired vision/perception;Decreased coordination;Decreased cognition;Decreased safety awareness;Decreased knowledge of use of DME or AE;Impaired UE functional use      OT Treatment/Interventions: Self-care/ADL training;Therapeutic exercise;Energy conservation;DME and/or AE instruction;Therapeutic activities;Patient/family education    OT Goals(Current goals can be found in the care plan section) Acute Rehab OT Goals Patient Stated Goal: none explicitly stated OT Goal Formulation: Patient unable to participate in goal setting Time For Goal Achievement: 06/23/20 Potential to Achieve Goals: Good ADL Goals Pt Will Perform Eating: with set-up;sitting Pt Will Perform Grooming: with supervision;sitting Pt Will Perform Upper Body Bathing: with min guard assist;sitting Pt Will Perform Lower Body Bathing: with mod assist;sitting/lateral leans;sit to/from stand Pt Will Transfer to Toilet: with mod assist;stand pivot transfer;bedside commode  OT Frequency: Min 2X/week   Barriers to D/C:            Co-evaluation              AM-PAC OT "6 Clicks" Daily Activity     Outcome Measure Help from another person eating meals?: A Little Help from another person taking care of personal grooming?: A Little Help from another person toileting, which includes using toliet, bedpan, or urinal?: Total Help from another person bathing (including washing, rinsing, drying)?: Total Help from another person to put on and taking off regular upper body clothing?: A Lot Help from another person to put on and taking off regular lower body clothing?: Total 6 Click Score: 11   End of Session Equipment Utilized During Treatment: Gait belt Nurse Communication: Mobility status  Activity Tolerance: Patient limited by fatigue;Patient limited by lethargy Patient left: in chair;with call  bell/phone within reach;with chair alarm set  OT Visit Diagnosis: Unsteadiness on feet (R26.81);Other abnormalities of gait and mobility (R26.89);Muscle weakness (generalized) (M62.81);Ataxia, unspecified (R27.0);Apraxia (R48.2);Other symptoms and signs involving cognitive function                Time: 5072-2575 OT Time Calculation (min): 32 min Charges:  OT General Charges $OT Visit: 1 Visit OT Evaluation $OT Eval High Complexity: 1 High OT Treatments $Self Care/Home Management : 8-22 mins  Layla Maw, OTR/L  Layla Maw 06/09/2020, 9:09 AM

## 2020-06-10 LAB — CBC
HCT: 45.9 % (ref 36.0–46.0)
Hemoglobin: 15 g/dL (ref 12.0–15.0)
MCH: 33.1 pg (ref 26.0–34.0)
MCHC: 32.7 g/dL (ref 30.0–36.0)
MCV: 101.3 fL — ABNORMAL HIGH (ref 80.0–100.0)
Platelets: 230 10*3/uL (ref 150–400)
RBC: 4.53 MIL/uL (ref 3.87–5.11)
RDW: 12.1 % (ref 11.5–15.5)
WBC: 14.7 10*3/uL — ABNORMAL HIGH (ref 4.0–10.5)
nRBC: 0 % (ref 0.0–0.2)

## 2020-06-10 LAB — COMPREHENSIVE METABOLIC PANEL
ALT: 13 U/L (ref 0–44)
AST: 22 U/L (ref 15–41)
Albumin: 3.8 g/dL (ref 3.5–5.0)
Alkaline Phosphatase: 72 U/L (ref 38–126)
Anion gap: 11 (ref 5–15)
BUN: 19 mg/dL (ref 8–23)
CO2: 26 mmol/L (ref 22–32)
Calcium: 9.9 mg/dL (ref 8.9–10.3)
Chloride: 100 mmol/L (ref 98–111)
Creatinine, Ser: 1.32 mg/dL — ABNORMAL HIGH (ref 0.44–1.00)
GFR calc Af Amer: 41 mL/min — ABNORMAL LOW (ref >60)
GFR calc non Af Amer: 36 mL/min — ABNORMAL LOW (ref >60)
Glucose, Bld: 112 mg/dL — ABNORMAL HIGH (ref 70–99)
Potassium: 4.1 mmol/L (ref 3.5–5.1)
Sodium: 137 mmol/L (ref 135–145)
Total Bilirubin: 1.1 mg/dL (ref 0.3–1.2)
Total Protein: 7.2 g/dL (ref 6.5–8.1)

## 2020-06-10 LAB — MAGNESIUM: Magnesium: 2 mg/dL (ref 1.7–2.4)

## 2020-06-10 LAB — PHOSPHORUS: Phosphorus: 3.4 mg/dL (ref 2.5–4.6)

## 2020-06-10 MED ORDER — SODIUM CHLORIDE 0.9 % IV SOLN: INTRAVENOUS | Status: AC

## 2020-06-10 NOTE — Progress Notes (Signed)
Night meds given early with daughters present.

## 2020-06-10 NOTE — Progress Notes (Signed)
Pt refused night meds.

## 2020-06-10 NOTE — Progress Notes (Addendum)
PROGRESS NOTE    Valerie Young  IEP:[id number] DOB: [birth year] DOA: 06/06/2020 PCP: Collene Gobble, MD   Brief Narrative:  The patient is a 86 year old occasion female with a past medical history significant for but not limited to hypertension, hyperlipidemia, chronic kidney stage IIIb with a baseline creatinine of 1.4 hypothyroidism, history of lung adenocarcinoma as well as other comorbidities who came to the hospital for further evaluation of her confusion as well as having persistent nausea vomiting.  Patient states that she was in her normal usual state of health and remembers eating a small piece of sausage in the afternoon and subsequently felt ill.  She was seen by her neighbor sitting on the porch very confused and acting very different.  She stated that she felt very ill and unable to state what happened to her.  She reported nausea with vomiting and epigastric abdominal pain she is brought to the emergency room for her persisting symptoms.  Initially in the ED she was given IV fluid resuscitation and confusion improved however again she continued to have some nausea vomiting.  On admission she had a CBC showing a WBC of 17.7 which is now improved to 12.2.  There is no clear source of infection noted and she had a normal ammonia level.  TSH was also within normal limits at 4.041, however T4 was slightly elevated.  Urinalysis was not suggestive of infection but did show five ketones, moderate leukocytes, negative nitrites, few bacteria, 0-5 RBCs per high-power field, 0-5 squamous epithelial cells and 11-20 WBCs.  Urine culture still pending. She had a head CT without contrast which showed no acute intracranial pathology and showed moderate age-related atrophy and chronic microvascular ischemic changes.   **Interim History A few days ago she had eye deviation and further work-up was initiated and she had an MRI and an MRI of the brain and neck and she is found to have an acute ischemic CVA with  left PCA-P2 segment occlusion.  Further work-up was initiated and she had a CTA of the head and neck done in a transthoracic echo was ordered and was done and still pending read.  She was started on aspirin 325 mg p.o. daily and given permissive hypertension.  Hemoglobin A1c and lipid panel ordered and she is placed on [facility] and she is n.p.o. until she passes full screen.  PT OT recommending SNF for placement.  She also underwent an EEG with nonspecific cortical dysfunction in the temporal region but no seizures or epileptiform activity was seen throughout the recording.  Because neurology feels that this is embolic so she underwent a lower extremity duplex which was negative for DVT.  I discussed the case with neurology Dr. Erlinda Hong who is going to be ordering a a loop recorder to be placed prior to discharge.    Patient underwent a CTA of the chest and a CT of the abdomen pelvis with contrast yesterday on 06/09/2020 and showed no evidence of abnormality in the abdomen/pelvis and no evidence of abdominal pelvic metastatic disease.  She was noted to have colonic diverticulosis without diverticulitis and the lungs were also evaluated and did not show any embolus.  She did have a right upper lobe lobectomy and a prior left lobe subpleural nodule which was was essentially unchanged.  She did also have a new 5 mm subpleural nodule in the left lower lobe and a 3 mm nodule in the central right lung.  PET CT scan was recommended.  Of note she  did have a Klebsiella UTI present on admission however because of her confusion we will start treatment with p.o. Keflex given that it is pansensitive.  06/10/2020 Overnight she became confused and pulled out her IVs she is sundowning.  Her renal function bumped slightly so she was started back on IV fluid hydration with normal saline given that she had contrast for her studies yesterday.  Her WBC also worsened so we will continue to monitor carefully.   Assessment & Plan:    Principal Problem:   Acute gastroenteritis Active Problems:   Chronic kidney disease (CKD), stage IV (severe) (HCC)   COPD (chronic obstructive pulmonary disease) with emphysema (HCC)   Essential hypertension, benign   Hypothyroidism   Adenocarcinoma, lung (HCC)   Essential hypertension   Gastroenteritis   Cerebral embolism with cerebral infarction  Acute CVA -Started having difficulty with her left eye vision and had some deviation yesterday afternoon -Head CT Scan showed "No acute intracranial pathology. Moderate age-related atrophy and chronic microvascular ischemic changes." -CTA Neck showed "Atherosclerotic disease of the carotid bifurcation bilaterally without significant carotid stenosis. No significant vertebral artery stenosis. Mild stenosis origin of left vertebral artery. Right PICA patent. Left PICA not visualized. Bilateral AICA patent. Post radiation changes left upper lobe. Aortic Atherosclerosis."  -Neurology consulted and appreciate their recommendations  -Stroke team following now -EEG also done and as below which showed nonspecific cortical dysfunction in temporal region but no seizures or epileptiform activity seen throughout the recording. -PT OT and SLP to evaluate and treat and SLP recommends regular diet with thin liquids and PT OT recommending SNF and social work is assisting with placement Continue neurochecks per protocol  -continue telemetry monitoring -Check hemoglobin A1c and was 5.9 -Patient's panel showed a total cholesterol/HDL ratio 2.6, cholesterol level of 141, HDL level 55, LDL of 70, triglycerides of 82, VLDL 16 -Started on aspirin 325 by neurology and will also continue her pravastatin will defer to neurology to change to atorvastatin but for now she will be continued on pravastatin; I discussed the case with Dr. Erlinda Hong and he initially recommended starting dual antiplatelet therapy but has not changed his mind and is going to continue aspirin 325 mg  p.o. daily -further care per neurology and echocardiogram was done and Dr Erlinda Hong considering implanting a loop recorder given her unclear etiology of her embolic CVA and is able to be done Monday -CTA of the chest and CT of the abdomen pelvis done and as below -Loop recorder to be done likely Monday  Intractable nausea vomiting likely due to acute gastroenteritis, improving -Patient's reports possible food poisoning but symptoms could have been related to UTI.No other significant history reported. -WBC is trending down and went from 17.7 -> 10.6 yesterday however is worsened again and went to 14.7 -IV fluid hydration is now stopped initially but will resume again given her mild elevation in her renal function from yesterday given the contrast -Continue with Antiemetics with p.o./IV Zofran every 6 hours as needed for nausea; she is given a dose of IV Zofran as well as a dose of IV Compazine in the ED -Clear liquid diet advance as tolerated and she is now on a regular diet and will need to monitor carefully as she was nauseous this morning -CT of the abdomen pelvis with contrast showed "No acute abnormality in the abdomen/pelvis. No evidence of abdominopelvic metastatic disease. Colonic diverticulosis without diverticulitis." -We will start empiric treatment for Klebsiella UTI present on admission  Klebsiella UTI present on  admission -Presented with a leukocytosis, encephalopathy and urinalysis done showed clear appearance with moderate leukocytes, few bacteria, 11-20 WBCs and urine culture grew out greater than 100,000 colony-forming units of Klebsiella -We will start p.o. Keflex given that she likely was symptomatic with her confusion nausea and vomiting -Start IV fluid hydration gently  Acute metabolic encephalopathy likely from above, initially improved but with waxing and waning -Acute encephalopathy was improving and head CT was negative as above however she was confused yesterday and a little  this morning and will continue with delirium precautions -Work upgrossly unrevealing with essentially normal ABG, ammonia level,no acute signs of infection. -Continue with pain control with p.o. Norco/Vicodin and IV morphine -She was confused and agitated overnight and pulled out her IVs -We will obtain a PT OT evaluation to determine a safe discharge disposition for this patient and they are recommending SNF -Continue to monitor carefully and will treat her acute CVA with aspirin and Plavix and also treat her urinary tract infection with p.o. antibiotics -Neuro work-up as above -we will place on delirium precautions  Hypertension  -Blood pressure stable but slightly elevated as her blood pressure was 162/68 -Continued Amlodipine 5 mg po Daily and Atenolol 50 mg po Daily  but likely will allow for permissive hypertension and will stop amlodipine  Hypothyroidism -Thyroid function appears to be within normal limits at 4.041 -Free T4 was slightly elevated at 1.19 -Continue Levothyroxine 50 mcg po Daily   Hyperlipidemia -Lipid panel as above -Continue with Pravastatin 40 m p.o. daily defer to neurology to change to atorvastatin 80 mg p.o. nightly but they feel that they will not do more intensive statin given her advanced age and LDL being at goal  Chronic Kidney Disease Stage IIIa,  Metabolic Acidosis -Renal function is stable,with most recent creatinine being 1.4 -BUN/creatinine went from 34/1.29 on admission and trended down but slightly bumped yesterday in the setting of her contrast and today her BUN/creatinine is 19/1.32 -Patient CO2 on admission was 21, chloride level was 104, and anion gap was 14; now CO2 is 26, anion gap is 11 and chloride level is 100 -avoid nephrotoxic medications, contrast dyes, hypotension and renally adjust medications  -Repeat CMP in a.m.  History of Lung Adenocarcinoma -Patient continue to follow-up routinely with pulmonology for surveillance of  cancer recurrence -After discussion with neurology will obtain a pan scan of her tomorrow and obtain a CT scan of the chest abdomen and pelvis to look for potential causes for her embolic stroke and given her hypercoagulability given history of lung cancer -CTA of the chest with contrast done showed "No pulmonary embolus. Post right upper lobectomy. 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 Previous left lower lobe subpleural nodule in the medial left lung base measures 14 x 10 mm, previously 12 x 9 mm in 2019. Lack of significant interval change favors post treatment related change, however not definitively characterized. Subpleural area of consolidation in the medial right lower lobe has increased in size and density from 2019 when it was ground-glass. This is indeterminate. New/increased 5 mm subpleural nodule in the left lower lobe. 3 mm nodule in the central right lung, not definitively seen on prior exam." -Recommendation is for  "PET CT may be helpful for evaluation of metabolic activity of the larger lesions. Smaller nodules are too small for PET characterization." and this can be done in the outpatient setting  Acquired Thrombophilia -Has a history of a PE  and DVT in the past and has a history of lung cancer -Previously was anticoagulated with Coumadin but no longer taking the Coumadin -She had further work-up in her lower extremity DVT study was negative for DVTs currently -CTA PE did not show any evidence of PE -Currently going to be treated with p.o. aspirin and Plavix for her CVA  DVT prophylaxis: Enoxaparin 30 mg sq q24h Code Status: FULL CODE  Family Communication: No family present at bedside but called daughter to update her Disposition Plan: SNF when cleared by Neurology,: Patient is likely going a loop recorder on Monday  Status is: Inpatient  Remains inpatient appropriate because:Unsafe d/c plan, IV  treatments appropriate due to intensity of illness or inability to take PO and Inpatient level of care appropriate due to severity of illness   Dispo: The patient is from: Home              Anticipated d/c is to: SNF              Anticipated d/c date is: 2 days              Patient currently is not medically stable to d/c.  Consultants:  Neurology   Procedures:  ECHOCARDIOGRAM; done and pending Read  LE VENOUS DUPLEX BILATERAL:  - No evidence of deep vein thrombosis seen in the lower extremities,  bilaterally.  -No evidence of popliteal cyst, bilaterally.   EEG Description: The posterior dominant rhythm consists of 8 Hz activity of moderate voltage (25-35 uV) seen predominantly in posterior head regions, symmetric and reactive to eye opening and eye closing. Drowsiness was characterized by attenuation of the posterior background rhythm.  EEG showed intermittent left temporal 3 to 6 Hz theta-delta slowing.  Hyperventilation and photic stimulation were not performed.     ABNORMALITY -Intermittent slow, left temporal region  IMPRESSION: This study is suggestive of non specific cortical dysfunction in left temporal region.  No seizures or epileptiform discharges were seen throughout the recording.  Antimicrobials:  Anti-infectives (From admission, onward)   Start     Dose/Rate Route Frequency Ordered Stop   06/09/20 1530  cephALEXin (KEFLEX) capsule 500 mg     Discontinue     500 mg Oral Every 12 hours 06/09/20 1518 06/11/20 2159     Subjective: Seen and examined at bedside and she was little agitated this morning did not really want to participate.  She had no complaints.  She is much more awake today than she was yesterday.  No nausea or vomiting.  No other concerns or complaints at this time.  Objective: Vitals:   06/09/20 2206 06/10/20 0500 06/10/20 0810 06/10/20 1626  BP: (!) 149/68  123/81 (!) 162/68  Pulse: 70  63 66  Resp: 16  19 18   Temp: 98.4 F (36.9 C)  98.3  F (36.8 C) 98.1 F (36.7 C)  TempSrc: Oral     SpO2: 92%  95% 95%  Weight:  50.3 kg    Height:  5\' 4"  (1.626 m)      Intake/Output Summary (Last 24 hours) at 06/10/2020 1836 Last data filed at 06/09/2020 2010 Gross per 24 hour  Intake 240 ml  Output --  Net 240 ml   Filed Weights   06/10/20 0500  Weight: 50.3 kg   Examination: Physical Exam:  Constitutional: The patient is a thin elderly Caucasian female who is currently alert agitated but in no acute distress. Eyes: Lids and conjunctivae normal, sclerae anicteric  ENMT: External  Ears, Nose appear normal.  She is a little hard of hearing Neck: Appears normal, supple, no cervical masses, normal ROM, no appreciable thyromegaly; no JVD Respiratory: Diminished to auscultation bilaterally, no wheezing, rales, rhonchi or crackles. Normal respiratory effort and patient is not tachypenic. No accessory muscle use.  Cardiovascular: RRR, the 2 out of 6 systolic murmur.. No extremity edema.  Abdomen: Soft, non-tender, non-distended. Bowel sounds positive.  GU: Deferred. Musculoskeletal: No clubbing / cyanosis of digits/nails. No joint deformity upper and lower extremities.  Skin: No rashes, lesions, ulcers on limited skin evaluation. No induration; Warm and dry.  Neurologic: CN 2-12 grossly intact with no focal deficits. Romberg sign and cerebellar reflexes not assessed.  Psychiatric: Impaired judgment and insight.  Awake but not fully oriented.  Somewhat agitated mood and appropriate affect.   Data Reviewed: I have personally reviewed following labs and imaging studies  CBC: Recent Labs  Lab 06/06/20 2217 06/06/20 2217 06/06/20 2221 06/07/20 0414 06/08/20 0251 06/09/20 0148 06/10/20 0707  WBC 17.7*  --   --  12.2* 11.1* 10.6* 14.7*  NEUTROABS 13.8*  --   --   --  8.1* 7.0  --   HGB 13.8   < > 14.3 13.1 13.1 13.3 15.0  HCT 42.7   < > 42.0 41.5 41.3 42.0 45.9  MCV 101.7*  --   --  101.5* 101.7* 100.2* 101.3*  PLT 250  --   --   221 217 236 230   < > = values in this interval not displayed.   Basic Metabolic Panel: Recent Labs  Lab 06/06/20 2217 06/06/20 2217 06/06/20 2221 06/07/20 0414 06/08/20 0251 06/09/20 0148 06/10/20 0433  NA 139   < > 140 140 138 138 137  K 3.9   < > 4.0 4.3 4.2 4.0 4.1  CL 104  --   --  104 103 105 100  CO2 21*  --   --  24 26 23 26   GLUCOSE 174*  --   --  158* 100* 92 112*  BUN 34*  --   --  27* 18 20 19   CREATININE 1.29*  --   --  1.29* 1.18* 1.12* 1.32*  CALCIUM 9.9  --   --  9.2 9.5 9.4 9.9  MG  --   --   --   --  2.2 2.1 2.0  PHOS  --   --   --   --  2.7 2.2* 3.4   < > = values in this interval not displayed.   GFR: Estimated Creatinine Clearance: 22.9 mL/min (A) (by C-G formula based on SCr of 1.32 mg/dL (H)). Liver Function Tests: Recent Labs  Lab 06/06/20 2217 06/08/20 0251 06/09/20 0148 06/10/20 0433  AST 23 23 25 22   ALT 14 13 10 13   ALKPHOS 73 67 66 72  BILITOT 0.6 0.8 0.9 1.1  PROT 7.8 7.0 6.6 7.2  ALBUMIN 4.2 3.8 3.6 3.8   No results for input(s): LIPASE, AMYLASE in the last 168 hours. Recent Labs  Lab 06/06/20 2217  AMMONIA 17   Coagulation Profile: No results for input(s): INR, PROTIME in the last 168 hours. Cardiac Enzymes: No results for input(s): CKTOTAL, CKMB, CKMBINDEX, TROPONINI in the last 168 hours. BNP (last 3 results) No results for input(s): PROBNP in the last 8760 hours. HbA1C: Recent Labs    06/08/20 0251  HGBA1C 5.9*   CBG: Recent Labs  Lab 06/06/20 2214  GLUCAP 173*   Lipid Profile: Recent Labs  06/08/20 0251  CHOL 141  HDL 55  LDLCALC 70  TRIG 82  CHOLHDL 2.6   Thyroid Function Tests: No results for input(s): TSH, T4TOTAL, FREET4, T3FREE, THYROIDAB in the last 72 hours. Anemia Panel: No results for input(s): VITAMINB12, FOLATE, FERRITIN, TIBC, IRON, RETICCTPCT in the last 72 hours. Sepsis Labs: Recent Labs  Lab 06/06/20 2217 06/07/20 0001  LATICACIDVEN 1.8 1.3    Recent Results (from the past 240  hour(s))  Urine culture     Status: Abnormal   Collection Time: 06/06/20  7:05 PM   Specimen: Urine, Random  Result Value Ref Range Status   Specimen Description URINE, RANDOM  Final   Special Requests   Final    NONE Performed at [facility], [address], [place], [place] [address]    Culture >=100,000 COLONIES/mL KLEBSIELLA PNEUMONIAE (A)  Final   Report Status 06/09/2020 FINAL  Final   Organism ID, Bacteria KLEBSIELLA PNEUMONIAE (A)  Final      Susceptibility   Klebsiella pneumoniae - MIC*    AMPICILLIN >=32 RESISTANT Resistant     CEFAZOLIN <=4 SENSITIVE Sensitive     CEFTRIAXONE <=0.25 SENSITIVE Sensitive     CIPROFLOXACIN <=0.25 SENSITIVE Sensitive     GENTAMICIN <=1 SENSITIVE Sensitive     IMIPENEM <=0.25 SENSITIVE Sensitive     NITROFURANTOIN <=16 SENSITIVE Sensitive     TRIMETH/SULFA <=20 SENSITIVE Sensitive     AMPICILLIN/SULBACTAM 8 SENSITIVE Sensitive     PIP/TAZO <=4 SENSITIVE Sensitive     * >=100,000 COLONIES/mL KLEBSIELLA PNEUMONIAE  SARS Coronavirus 2 by RT PCR (hospital order, performed in [place] hospital lab) Nasopharyngeal Nasopharyngeal Swab     Status: None   Collection Time: 06/07/20  2:22 AM   Specimen: Nasopharyngeal Swab  Result Value Ref Range Status   SARS Coronavirus 2 NEGATIVE NEGATIVE Final    Comment: (NOTE) SARS-CoV-2 target nucleic acids are NOT DETECTED.  The SARS-CoV-2 RNA is generally detectable in upper and lower respiratory specimens during the acute phase of infection. The lowest concentration of SARS-CoV-2 viral copies this assay can detect is 250 copies / mL. A negative result does not preclude SARS-CoV-2 infection and should not be used as the sole basis for treatment or other patient management decisions.  A negative result may occur with improper specimen collection / handling, submission of specimen other than nasopharyngeal swab, presence of viral mutation(s) within the areas targeted by this assay, and inadequate  number of viral copies (<250 copies / mL). A negative result must be combined with clinical observations, patient history, and epidemiological information.  Fact Sheet for Patients:   StrictlyIdeas.no  Fact Sheet for Healthcare Providers: BankingDealers.co.za  This test is not yet approved or  cleared by the [place] FDA and has been authorized for detection and/or diagnosis of SARS-CoV-2 by FDA under an Emergency Use Authorization (EUA).  This EUA will remain in effect (meaning this test can be used) for the duration of the COVID-19 declaration under Section 564(b)(1) of the Act, 21 U.S.C. section 360bbb-3(b)(1), unless the authorization is terminated or revoked sooner.  Performed at [facility], [place] [address], [place], [place] [address]     RN Pressure Injury Documentation:     Estimated body mass index is 19.03 kg/m as calculated from the following:   Height as of this encounter: 5\' 4"  (1.626 m).   Weight as of this encounter: 50.3 kg.  Malnutrition Type:      Malnutrition Characteristics:  Nutrition Interventions:    Radiology Studies: CT ANGIO CHEST PE W OR WO CONTRAST  Result Date: 06/09/2020 CLINICAL DATA:  Shortness of breath. Lung nodule. History of lung cancer. Stroke. EXAM: CT ANGIOGRAPHY CHEST CT ABDOMEN AND PELVIS WITH CONTRAST TECHNIQUE: Multidetector CT imaging of the chest was performed using the standard protocol during bolus administration of intravenous contrast. Multiplanar CT image reconstructions and MIPs were obtained to evaluate the vascular anatomy. Multidetector CT imaging of the abdomen and pelvis was performed using the standard protocol during bolus administration of intravenous contrast. CONTRAST:  63mL OMNIPAQUE IOHEXOL 350 MG/ML SOLN COMPARISON:  Most recent chest CT 10/29/2018. FINDINGS: CTA CHEST FINDINGS Cardiovascular: There are no filling defects within the pulmonary  arteries to suggest pulmonary embolus. The left upper lobe pulmonary arteries are tree attic. Atherosclerosis of the thoracic aorta. Limited aortic assessment given phase of contrast tailored to pulmonary artery evaluation. The heart is normal in size. Coronary artery calcifications. No pericardial effusion. Mediastinum/Nodes: No enlarged mediastinal or hilar lymph nodes. Left suprahilar assessment is slightly limited due to chronic consolidation. No visualized thyroid nodule. No esophageal wall thickening. Lungs/Pleura: Prior right upper lobectomy. 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 This is stable from 2019. Medial segment of the left lower lobe demonstrates areas of irregular scarring and bronchiectasis. Some subpleural nodularity in this region measures 14 x 10 mm, similar to prior. There is subpleural nodule in the left lower lobe mean diameter 5 mm that is new/increased in size from prior exam, series 6, image 65. Patchy subpleural ground-glass in the anterior right middle lobe appears chronic and may be post treatment related change. Subpleural area of consolidation in the medial right lower lobe has increased in size and density currently measuring approximately 2.3 x 1.3 cm, series 6, image 65 there is internal bronchogram. Previously there was ground-glass opacity in this region. 3 mm nodule in the central right lung, series 6, image 53 not definitively seen on prior. Underlying emphysema. Trace right pleural thickening dependently. No significant pleural effusion. Underlying trachea and central bronchi are patent. Musculoskeletal: Postsurgical change in the right hemithorax. No evidence of focal bone lesion. Chronic thinning of left anterior ribs likely post treatment related. Review of the MIP images confirms the above findings. CT ABDOMEN and PELVIS FINDINGS Hepatobiliary: No focal liver abnormality is seen. No gallstones, gallbladder wall thickening,  or biliary dilatation. Pancreas: Slight prominence of the pancreatic duct without significant ductal dilatation. No peripancreatic inflammation or evidence of mass. Spleen: Normal in size without focal abnormality. Adrenals/Urinary Tract: No adrenal nodule. Bilateral renal parenchymal thinning and areas of scarring, more prominent on the left. No hydronephrosis. No evidence of focal renal lesion. Symmetric excretion on delayed phase imaging. Urinary bladder is partially distended, equivocal bladder wall thickening. Stomach/Bowel: Stomach is unremarkable. Administered enteric contrast is seen within the distal small bowel and throughout the colon. No obstruction. No bowel inflammation. Normal appendix filled with contrast. Diverticulosis of the descending and sigmoid colon without diverticulitis. Vascular/Lymphatic: Aorto bi-iliac atherosclerosis. No aortic aneurysm. Patent portal vein. No abdominopelvic adenopathy. Reproductive: Status post hysterectomy. No adnexal masses. Other: No ascites. No free air. Small fat containing umbilical hernia. Musculoskeletal: No focal bone lesion. Scoliosis and degenerative change throughout the spine. Degenerative change of both hips. Review of the MIP images confirms the above findings. IMPRESSION: Chest CTA: 1. No pulmonary embolus. 2. Post right upper lobectomy. 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  3. Previous left lower lobe subpleural nodule in the medial left lung base measures 14 x 10 mm, previously 12 x 9 mm in 2019. Lack of significant interval change favors post treatment related change, however not definitively characterized. 4. Subpleural area of consolidation in the medial right lower lobe has increased in size and density from 2019 when it was ground-glass. This is indeterminate. 5. New/increased 5 mm subpleural nodule in the left lower lobe. 3 mm nodule in the central right lung, not  definitively seen on prior exam. 6. PET CT may be helpful for evaluation of metabolic activity of the larger lesions. Smaller nodules are too small for PET characterization. CT abdomen/pelvis: 1. No acute abnormality in the abdomen/pelvis. No evidence of abdominopelvic metastatic disease. 2. Colonic diverticulosis without diverticulitis. Aortic Atherosclerosis (ICD10-I70.0) and Emphysema (ICD10-J43.9). Electronically Signed   By: Keith Rake M.D.   On: 06/09/2020 15:46   CT ABDOMEN PELVIS W CONTRAST  Result Date: 06/09/2020 CLINICAL DATA:  Shortness of breath. Lung nodule. History of lung cancer. Stroke. EXAM: CT ANGIOGRAPHY CHEST CT ABDOMEN AND PELVIS WITH CONTRAST TECHNIQUE: Multidetector CT imaging of the chest was performed using the standard protocol during bolus administration of intravenous contrast. Multiplanar CT image reconstructions and MIPs were obtained to evaluate the vascular anatomy. Multidetector CT imaging of the abdomen and pelvis was performed using the standard protocol during bolus administration of intravenous contrast. CONTRAST:  76mL OMNIPAQUE IOHEXOL 350 MG/ML SOLN COMPARISON:  Most recent chest CT 10/29/2018. FINDINGS: CTA CHEST FINDINGS Cardiovascular: There are no filling defects within the pulmonary arteries to suggest pulmonary embolus. The left upper lobe pulmonary arteries are tree attic. Atherosclerosis of the thoracic aorta. Limited aortic assessment given phase of contrast tailored to pulmonary artery evaluation. The heart is normal in size. Coronary artery calcifications. No pericardial effusion. Mediastinum/Nodes: No enlarged mediastinal or hilar lymph nodes. Left suprahilar assessment is slightly limited due to chronic consolidation. No visualized thyroid nodule. No esophageal wall thickening. Lungs/Pleura: Prior right upper lobectomy. 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 This is stable from 2019. Medial segment of  the left lower lobe demonstrates areas of irregular scarring and bronchiectasis. Some subpleural nodularity in this region measures 14 x 10 mm, similar to prior. There is subpleural nodule in the left lower lobe mean diameter 5 mm that is new/increased in size from prior exam, series 6, image 65. Patchy subpleural ground-glass in the anterior right middle lobe appears chronic and may be post treatment related change. Subpleural area of consolidation in the medial right lower lobe has increased in size and density currently measuring approximately 2.3 x 1.3 cm, series 6, image 65 there is internal bronchogram. Previously there was ground-glass opacity in this region. 3 mm nodule in the central right lung, series 6, image 53 not definitively seen on prior. Underlying emphysema. Trace right pleural thickening dependently. No significant pleural effusion. Underlying trachea and central bronchi are patent. Musculoskeletal: Postsurgical change in the right hemithorax. No evidence of focal bone lesion. Chronic thinning of left anterior ribs likely post treatment related. Review of the MIP images confirms the above findings. CT ABDOMEN and PELVIS FINDINGS Hepatobiliary: No focal liver abnormality is seen. No gallstones, gallbladder wall thickening, or biliary dilatation. Pancreas: Slight prominence of the pancreatic duct without significant ductal dilatation. No peripancreatic inflammation or evidence of mass. Spleen: Normal in size without focal abnormality. Adrenals/Urinary Tract: No adrenal nodule. Bilateral renal parenchymal thinning and areas of scarring, more prominent on the left.  No hydronephrosis. No evidence of focal renal lesion. Symmetric excretion on delayed phase imaging. Urinary bladder is partially distended, equivocal bladder wall thickening. Stomach/Bowel: Stomach is unremarkable. Administered enteric contrast is seen within the distal small bowel and throughout the colon. No obstruction. No bowel  inflammation. Normal appendix filled with contrast. Diverticulosis of the descending and sigmoid colon without diverticulitis. Vascular/Lymphatic: Aorto bi-iliac atherosclerosis. No aortic aneurysm. Patent portal vein. No abdominopelvic adenopathy. Reproductive: Status post hysterectomy. No adnexal masses. Other: No ascites. No free air. Small fat containing umbilical hernia. Musculoskeletal: No focal bone lesion. Scoliosis and degenerative change throughout the spine. Degenerative change of both hips. Review of the MIP images confirms the above findings. IMPRESSION: Chest CTA: 1. No pulmonary embolus. 2. Post right upper lobectomy. 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 3. Previous left lower lobe subpleural nodule in the medial left lung base measures 14 x 10 mm, previously 12 x 9 mm in 2019. Lack of significant interval change favors post treatment related change, however not definitively characterized. 4. Subpleural area of consolidation in the medial right lower lobe has increased in size and density from 2019 when it was ground-glass. This is indeterminate. 5. New/increased 5 mm subpleural nodule in the left lower lobe. 3 mm nodule in the central right lung, not definitively seen on prior exam. 6. PET CT may be helpful for evaluation of metabolic activity of the larger lesions. Smaller nodules are too small for PET characterization. CT abdomen/pelvis: 1. No acute abnormality in the abdomen/pelvis. No evidence of abdominopelvic metastatic disease. 2. Colonic diverticulosis without diverticulitis. Aortic Atherosclerosis (ICD10-I70.0) and Emphysema (ICD10-J43.9). Electronically Signed   By: Keith Rake M.D.   On: 06/09/2020 15:46   Scheduled Meds: .  stroke: mapping our early stages of recovery book   Does not apply Once  . aspirin  300 mg Rectal Daily   Or  . aspirin  325 mg Oral Daily  . atenolol  50 mg Oral Daily  . cephALEXin   500 mg Oral Q12H  . enoxaparin (LOVENOX) injection  30 mg Subcutaneous Q24H  . levothyroxine  50 mcg Oral QAC breakfast  . pravastatin  40 mg Oral Daily   Continuous Infusions: . sodium chloride 75 mL/hr at 06/10/20 1535     LOS: 3 days   Kerney Elbe, DO [facility] PAGER is on [place]  If 7PM-7AM, please contact night-coverage [email or web address]

## 2020-06-10 NOTE — Progress Notes (Signed)
Pt up on side of bed confused. Both IVs out in bed. Pt has thrown all clothes and sheets in floor. Pt is fussing and being very mean to staff. Pt is trying to slap our hands away. Pt stated on multiple times that she doesn't want an IV. Pt is bathed, linen changed, and room cleaned. Pt is put in soft mittens.

## 2020-06-11 LAB — CBC WITH DIFFERENTIAL/PLATELET
Abs Immature Granulocytes: 0.03 10*3/uL (ref 0.00–0.07)
Basophils Absolute: 0.1 10*3/uL (ref 0.0–0.1)
Basophils Relative: 1 %
Eosinophils Absolute: 0.2 10*3/uL (ref 0.0–0.5)
Eosinophils Relative: 2 %
HCT: 42.8 % (ref 36.0–46.0)
Hemoglobin: 13.8 g/dL (ref 12.0–15.0)
Immature Granulocytes: 0 %
Lymphocytes Relative: 19 %
Lymphs Abs: 2 10*3/uL (ref 0.7–4.0)
MCH: 32.9 pg (ref 26.0–34.0)
MCHC: 32.2 g/dL (ref 30.0–36.0)
MCV: 102.1 fL — ABNORMAL HIGH (ref 80.0–100.0)
Monocytes Absolute: 1.4 10*3/uL — ABNORMAL HIGH (ref 0.1–1.0)
Monocytes Relative: 13 %
Neutro Abs: 6.9 10*3/uL (ref 1.7–7.7)
Neutrophils Relative %: 65 %
Platelets: 233 10*3/uL (ref 150–400)
RBC: 4.19 MIL/uL (ref 3.87–5.11)
RDW: 12.3 % (ref 11.5–15.5)
WBC: 10.5 10*3/uL (ref 4.0–10.5)
nRBC: 0 % (ref 0.0–0.2)

## 2020-06-11 LAB — COMPREHENSIVE METABOLIC PANEL
ALT: 11 U/L (ref 0–44)
AST: 19 U/L (ref 15–41)
Albumin: 3.3 g/dL — ABNORMAL LOW (ref 3.5–5.0)
Alkaline Phosphatase: 64 U/L (ref 38–126)
Anion gap: 9 (ref 5–15)
BUN: 23 mg/dL (ref 8–23)
CO2: 25 mmol/L (ref 22–32)
Calcium: 9.2 mg/dL (ref 8.9–10.3)
Chloride: 105 mmol/L (ref 98–111)
Creatinine, Ser: 1.13 mg/dL — ABNORMAL HIGH (ref 0.44–1.00)
GFR calc Af Amer: 50 mL/min — ABNORMAL LOW (ref >60)
GFR calc non Af Amer: 43 mL/min — ABNORMAL LOW (ref >60)
Glucose, Bld: 111 mg/dL — ABNORMAL HIGH (ref 70–99)
Potassium: 4 mmol/L (ref 3.5–5.1)
Sodium: 139 mmol/L (ref 135–145)
Total Bilirubin: 0.2 mg/dL — ABNORMAL LOW (ref 0.3–1.2)
Total Protein: 6.7 g/dL (ref 6.5–8.1)

## 2020-06-11 LAB — MAGNESIUM: Magnesium: 2.1 mg/dL (ref 1.7–2.4)

## 2020-06-11 LAB — PHOSPHORUS: Phosphorus: 3.2 mg/dL (ref 2.5–4.6)

## 2020-06-11 MED ORDER — CEPHALEXIN 500 MG PO CAPS
500.0000 mg | ORAL_CAPSULE | Freq: Two times a day (BID) | ORAL | Status: DC
  Administered 2020-06-11 – 2020-06-13 (×4): 500 mg via ORAL
  Filled 2020-06-11 (×4): qty 1

## 2020-06-11 NOTE — Plan of Care (Signed)
  Problem: Education: Goal: Knowledge of disease or condition will improve Outcome: Not Progressing Goal: Knowledge of secondary prevention will improve Outcome: Not Progressing Goal: Knowledge of patient specific risk factors addressed and post discharge goals established will improve Outcome: Not Progressing   Problem: Coping: Goal: Will identify appropriate support needs Outcome: Not Progressing   Problem: Health Behavior/Discharge Planning: Goal: Ability to manage health-related needs will improve Outcome: Not Progressing   Problem: Nutrition: Goal: Dietary intake will improve Outcome: Not Progressing   Problem: Ischemic Stroke/TIA Tissue Perfusion: Goal: Complications of ischemic stroke/TIA will be minimized Outcome: Not Progressing

## 2020-06-11 NOTE — Progress Notes (Signed)
PROGRESS NOTE    Valerie Young  RCV:[id number] DOB: 1929-10-13 DOA: 06/06/2020 PCP: Collene Gobble, MD   Brief Narrative:  The patient is a 86 year old occasion female with a past medical history significant for but not limited to hypertension, hyperlipidemia, chronic kidney stage IIIb with a baseline creatinine of 1.4 hypothyroidism, history of lung adenocarcinoma as well as other comorbidities who came to the hospital for further evaluation of her confusion as well as having persistent nausea vomiting.  Patient states that she was in her normal usual state of health and remembers eating a small piece of sausage in the afternoon and subsequently felt ill.  She was seen by her neighbor sitting on the porch very confused and acting very different.  She stated that she felt very ill and unable to state what happened to her.  She reported nausea with vomiting and epigastric abdominal pain she is brought to the emergency room for her persisting symptoms.  Initially in the ED she was given IV fluid resuscitation and confusion improved however again she continued to have some nausea vomiting.  On admission she had a CBC showing a WBC of 17.7 which is now improved to 12.2.  There is no clear source of infection noted and she had a normal ammonia level.  TSH was also within normal limits at 4.041, however T4 was slightly elevated.  Urinalysis was not suggestive of infection but did show five ketones, moderate leukocytes, negative nitrites, few bacteria, 0-5 RBCs per high-power field, 0-5 squamous epithelial cells and 11-20 WBCs.  Urine culture still pending. She had a head CT without contrast which showed no acute intracranial pathology and showed moderate age-related atrophy and chronic microvascular ischemic changes.   **Interim History A few days ago she had eye deviation and further work-up was initiated and she had an MRI and an MRI of the brain and neck and she is found to have an acute ischemic CVA with  left PCA-P2 segment occlusion.  Further work-up was initiated and she had a CTA of the head and neck done in a transthoracic echo was ordered and was done and still pending read.  She was started on aspirin 325 mg p.o. daily and given permissive hypertension.  Hemoglobin A1c and lipid panel ordered and she is placed on [facility] and she is n.p.o. until she passes full screen.  PT OT recommending SNF for placement.  She also underwent an EEG with nonspecific cortical dysfunction in the temporal region but no seizures or epileptiform activity was seen throughout the recording.  Because neurology feels that this is embolic so she underwent a lower extremity duplex which was negative for DVT.  I discussed the case with neurology Dr. Erlinda Hong who is going to be ordering a a loop recorder to be placed prior to discharge.    Patient underwent a CTA of the chest and a CT of the abdomen pelvis with contrast on 06/09/2020 and showed no evidence of abnormality in the abdomen/pelvis and no evidence of abdominal pelvic metastatic disease.  She was noted to have colonic diverticulosis without diverticulitis and the lungs were also evaluated and did not show any embolus.  She did have a right upper lobe lobectomy and a prior left lobe subpleural nodule which was was essentially unchanged.  She did also have a new 5 mm subpleural nodule in the left lower lobe and a 3 mm nodule in the central right lung.  PET CT scan was recommended.  Of note she did  have a Klebsiella UTI present on admission however because of her confusion we will start treatment with p.o. Keflex given that it is pansensitive.  06/10/2020 Overnight she became confused and pulled out her IVs she is sundowning.  Her renal function bumped slightly so she was started back on IV fluid hydration with normal saline given that she had contrast for her studies yesterday.  Her WBC also worsened so we will continue to monitor carefully.  06/11/2020 She is going to likely  get a loop recorder done tomorrow.  Her leukocytosis is improved and was 10.5 today.  Creatinine is also improved after IV fluid hydration and now IV fluids have now stopped.   Assessment & Plan:   Principal Problem:   Acute gastroenteritis Active Problems:   Chronic kidney disease (CKD), stage IV (severe) (HCC)   COPD (chronic obstructive pulmonary disease) with emphysema (HCC)   Essential hypertension, benign   Hypothyroidism   Adenocarcinoma, lung (HCC)   Essential hypertension   Gastroenteritis   Cerebral embolism with cerebral infarction  Acute CVA -Started having difficulty with her left eye vision and had some deviation yesterday afternoon -Head CT Scan showed "No acute intracranial pathology. Moderate age-related atrophy and chronic microvascular ischemic changes." -CTA Neck showed "Atherosclerotic disease of the carotid bifurcation bilaterally without significant carotid stenosis. No significant vertebral artery stenosis. Mild stenosis origin of left vertebral artery. Right PICA patent. Left PICA not visualized. Bilateral AICA patent. Post radiation changes left upper lobe. Aortic Atherosclerosis."  -Neurology consulted and appreciate their recommendations  -Stroke team following now -EEG also done and as below which showed nonspecific cortical dysfunction in temporal region but no seizures or epileptiform activity seen throughout the recording. -PT OT and SLP to evaluate and treat and SLP recommends regular diet with thin liquids and PT OT recommending SNF and social work is assisting with placement Continue neurochecks per protocol  -continue telemetry monitoring -Check hemoglobin A1c and was 5.9 -Patient's panel showed a total cholesterol/HDL ratio 2.6, cholesterol level of 141, HDL level 55, LDL of 70, triglycerides of 82, VLDL 16 -Started on aspirin 325 by neurology and will also continue her pravastatin will defer to neurology to change to atorvastatin but for now she  will be continued on pravastatin; I discussed the case with Dr. Erlinda Hong we will start the patient on dual antiplatelet therapy now for 3 weeks and then change back to aspirin -further care per neurology and echocardiogram was done and Dr Erlinda Hong considering implanting a loop recorder given her unclear etiology of her embolic CVA and is able to be done Monday -CTA of the chest and CT of the abdomen pelvis done and as below -Loop recorder to be done likely Monday 06/12/2020  Intractable nausea vomiting likely due to acute gastroenteritis, improved -Patient's reports possible food poisoning but symptoms could have been related to UTI.No other significant history reported. -WBC is trending down and went from 17.7 -> 10.6 yesterday however is worsened again and went to 14.7 yesterday and today it is improved at 10.5 -IV fluid hydration is now stopped initially but will resume again given her mild elevation in her renal function from yesterday given the contrast -Continue with Antiemetics with p.o./IV Zofran every 6 hours as needed for nausea; she is given a dose of IV Zofran as well as a dose of IV Compazine in the ED -Clear liquid diet advance as tolerated and she is now on a regular diet and will need to monitor carefully as she was nauseous this  morning -CT of the abdomen pelvis with contrast showed "No acute abnormality in the abdomen/pelvis. No evidence of abdominopelvic metastatic disease. Colonic diverticulosis without diverticulitis." -We will start empiric treatment for Klebsiella UTI present on admission  Klebsiella UTI present on admission -Presented with a leukocytosis, encephalopathy and urinalysis done showed clear appearance with moderate leukocytes, few bacteria, 11-20 WBCs and urine culture grew out greater than 100,000 colony-forming units of Klebsiella -WBC is improved to 10.5 today -We will start p.o. Keflex given that she likely was symptomatic with her confusion nausea and vomiting -Start  IV fluid hydration gently  Acute metabolic encephalopathy likely from above, initially improved but with waxing and waning -Acute encephalopathy was improving and head CT was negative as above however she was confused yesterday and a little this morning and will continue with delirium precautions -Work upgrossly unrevealing with essentially normal ABG, ammonia level,no acute signs of infection. -Continue with pain control with p.o. Norco/Vicodin and IV morphine -She was confused and agitated overnight and pulled out her IVs -We will obtain a PT OT evaluation to determine a safe discharge disposition for this patient and they are recommending SNF -Continue to monitor carefully and will treat her acute CVA with aspirin and Plavix and also treat her urinary tract infection with p.o. antibiotics -Neuro work-up as above -we will place on delirium precautions  Hypertension  -Blood pressure stable but slightly elevated as her blood pressure was 155/73 -Continued Amlodipine 5 mg po Daily and Atenolol 50 mg po Daily  but likely will allow for permissive hypertension and will stop amlodipine  Hypothyroidism -Thyroid function appears to be within normal limits at 4.041 -Free T4 was slightly elevated at 1.19 -Continue Levothyroxine 50 mcg po Daily   Hyperlipidemia -Lipid panel as above -Continue with Pravastatin 40 m p.o. daily defer to neurology to change to atorvastatin 80 mg p.o. nightly but they feel that they will not do more intensive statin given her advanced age and LDL being at goal  Chronic Kidney Disease Stage IIIa,  Metabolic Acidosis -Renal function is stable,with most recent creatinine being 1.4 -BUN/creatinine went from 34/1.29 on admission an had initially trended down but bumped up in the setting of contrast but is now improved and BUN/creatinine is 23/1.13 -Gentle IV fluid hydration with normal saline is now stopped -Patient CO2 on admission was 21, chloride level was 104,  and anion gap was 14; now CO2 is 26, anion gap is 11 and chloride level is 100 -avoid nephrotoxic medications, contrast dyes, hypotension and renally adjust medications  -Repeat CMP in a.m.  History of Lung Adenocarcinoma -Patient continue to follow-up routinely with pulmonology for surveillance of cancer recurrence -After discussion with neurology will obtain a pan scan of her tomorrow and obtain a CT scan of the chest abdomen and pelvis to look for potential causes for her embolic stroke and given her hypercoagulability given history of lung cancer -CTA of the chest with contrast done showed "No pulmonary embolus. Post right upper lobectomy. 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 Previous left lower lobe subpleural nodule in the medial left lung base measures 14 x 10 mm, previously 12 x 9 mm in 2019. Lack of significant interval change favors post treatment related change, however not definitively characterized. Subpleural area of consolidation in the medial right lower lobe has increased in size and density from 2019 when it was ground-glass. This is indeterminate. New/increased 5 mm subpleural nodule in the left lower  lobe. 3 mm nodule in the central right lung, not definitively seen on prior exam." -Recommendation is for  "PET CT may be helpful for evaluation of metabolic activity of the larger lesions. Smaller nodules are too small for PET characterization." and this can be done in the outpatient setting  Acquired Thrombophilia -Has a history of a PE and DVT in the past and has a history of lung cancer -Previously was anticoagulated with Coumadin but no longer taking the Coumadin -She had further work-up in her lower extremity DVT study was negative for DVTs currently -CTA PE did not show any evidence of PE -Currently going to be treated with p.o. aspirin and Plavix for her CVA  DVT prophylaxis: Enoxaparin 30 mg sq  q24h Code Status: FULL CODE  Family Communication: No family present at bedside but called daughter to update her Disposition Plan: SNF when cleared by Neurology,: Patient is likely going a loop recorder on Monday and likely can be discharged to SNF after; family has decided on SNF placement first and insurance authorization  Status is: Inpatient  Remains inpatient appropriate because:Unsafe d/c plan, IV treatments appropriate due to intensity of illness or inability to take PO and Inpatient level of care appropriate due to severity of illness   Dispo: The patient is from: Home              Anticipated d/c is to: SNF              Anticipated d/c date is: 2 days              Patient currently is not medically stable to d/c.  Consultants:  Neurology   Procedures:  ECHOCARDIOGRAM; done and pending Read  LE VENOUS DUPLEX BILATERAL:  - No evidence of deep vein thrombosis seen in the lower extremities,  bilaterally.  -No evidence of popliteal cyst, bilaterally.   EEG Description: The posterior dominant rhythm consists of 8 Hz activity of moderate voltage (25-35 uV) seen predominantly in posterior head regions, symmetric and reactive to eye opening and eye closing. Drowsiness was characterized by attenuation of the posterior background rhythm.  EEG showed intermittent left temporal 3 to 6 Hz theta-delta slowing.  Hyperventilation and photic stimulation were not performed.     ABNORMALITY -Intermittent slow, left temporal region  IMPRESSION: This study is suggestive of non specific cortical dysfunction in left temporal region.  No seizures or epileptiform discharges were seen throughout the recording.  Antimicrobials:  Anti-infectives (From admission, onward)   Start     Dose/Rate Route Frequency Ordered Stop   06/09/20 1530  cephALEXin (KEFLEX) capsule 500 mg     Discontinue     500 mg Oral Every 12 hours 06/09/20 1518 06/11/20 2159     Subjective: Seen and examined at bedside  and she is resting comfortably in bed wanting to sleep.  Had no complaints.  No nausea or vomiting.  Denies any chest pain or shortness breath.  No other concerns or complaints at this time.  Objective: Vitals:   06/10/20 0810 06/10/20 1626 06/10/20 2137 06/11/20 0821  BP: 123/81 (!) 162/68 (!) 147/63 (!) 158/70  Pulse: 63 66 60 74  Resp: 19 18 16 19   Temp: 98.3 F (36.8 C) 98.1 F (36.7 C) 98.3 F (36.8 C) 97.8 F (36.6 C)  TempSrc:   Oral   SpO2: 95% 95% 96% 94%  Weight:      Height:        Intake/Output Summary (Last 24 hours) at  06/11/2020 1715 Last data filed at 06/10/2020 1940 Gross per 24 hour  Intake 278.51 ml  Output --  Net 278.51 ml   Filed Weights   06/10/20 0500  Weight: 50.3 kg   Examination: Physical Exam:  Constitutional: Patient is a thin elderly Caucasian female who is currently resting in no acute distress,  Eyes: PERRL, lids and conjunctivae normal, sclerae anicteric  ENMT: External Ears, Nose appear normal. Grossly normal hearing. Mucous membranes are moist. Posterior pharynx clear of any exudate or lesions. Normal dentition.  Neck: Appears normal, supple, no cervical masses, normal ROM, no appreciable thyromegaly Respiratory: Clear to auscultation bilaterally, no wheezing, rales, rhonchi or crackles. Normal respiratory effort and patient is not tachypenic. No accessory muscle use.  Unlabored breathing Cardiovascular: RRR, no murmurs / rubs / gallops. S1 and S2 auscultated. No extremity edema.  Abdomen: Soft, non-tender, non-distended. Bowel sounds positive.  GU: Deferred. Musculoskeletal: No clubbing / cyanosis of digits/nails. No joint deformity upper and lower extremities.  Skin: No rashes, lesions, ulcers on limited skin evaluation. No induration; Warm and dry.  Neurologic: CN 2-12 grossly intact with no focal deficits.  Romberg sign and cerebellar reflexes not assessed.  Psychiatric: Impaired judgment and insight.  Drowsy and sleepy. normal mood and  appropriate affect.   Data Reviewed: I have personally reviewed following labs and imaging studies  CBC: Recent Labs  Lab 06/06/20 2217 06/06/20 2221 06/07/20 0414 06/08/20 0251 06/09/20 0148 06/10/20 0707 06/11/20 0413  WBC 17.7*   < > 12.2* 11.1* 10.6* 14.7* 10.5  NEUTROABS 13.8*  --   --  8.1* 7.0  --  6.9  HGB 13.8   < > 13.1 13.1 13.3 15.0 13.8  HCT 42.7   < > 41.5 41.3 42.0 45.9 42.8  MCV 101.7*   < > 101.5* 101.7* 100.2* 101.3* 102.1*  PLT 250   < > 221 217 236 230 233   < > = values in this interval not displayed.   Basic Metabolic Panel: Recent Labs  Lab 06/07/20 0414 06/08/20 0251 06/09/20 0148 06/10/20 0433 06/11/20 0413  NA 140 138 138 137 139  K 4.3 4.2 4.0 4.1 4.0  CL 104 103 105 100 105  CO2 24 26 23 26 25   GLUCOSE 158* 100* 92 112* 111*  BUN 27* 18 20 19 23   CREATININE 1.29* 1.18* 1.12* 1.32* 1.13*  CALCIUM 9.2 9.5 9.4 9.9 9.2  MG  --  2.2 2.1 2.0 2.1  PHOS  --  2.7 2.2* 3.4 3.2   GFR: Estimated Creatinine Clearance: 26.8 mL/min (A) (by C-G formula based on SCr of 1.13 mg/dL (H)). Liver Function Tests: Recent Labs  Lab 06/06/20 2217 06/08/20 0251 06/09/20 0148 06/10/20 0433 06/11/20 0413  AST 23 23 25 22 19   ALT 14 13 10 13 11   ALKPHOS 73 67 66 72 64  BILITOT 0.6 0.8 0.9 1.1 0.2*  PROT 7.8 7.0 6.6 7.2 6.7  ALBUMIN 4.2 3.8 3.6 3.8 3.3*   No results for input(s): LIPASE, AMYLASE in the last 168 hours. Recent Labs  Lab 06/06/20 2217  AMMONIA 17   Coagulation Profile: No results for input(s): INR, PROTIME in the last 168 hours. Cardiac Enzymes: No results for input(s): CKTOTAL, CKMB, CKMBINDEX, TROPONINI in the last 168 hours. BNP (last 3 results) No results for input(s): PROBNP in the last 8760 hours. HbA1C: No results for input(s): HGBA1C in the last 72 hours. CBG: Recent Labs  Lab 06/06/20 2214  GLUCAP 173*   Lipid Profile: No  results for input(s): CHOL, HDL, LDLCALC, TRIG, CHOLHDL, LDLDIRECT in the last 72 hours. Thyroid  Function Tests: No results for input(s): TSH, T4TOTAL, FREET4, T3FREE, THYROIDAB in the last 72 hours. Anemia Panel: No results for input(s): VITAMINB12, FOLATE, FERRITIN, TIBC, IRON, RETICCTPCT in the last 72 hours. Sepsis Labs: Recent Labs  Lab 06/06/20 2217 06/07/20 0001  LATICACIDVEN 1.8 1.3    Recent Results (from the past 240 hour(s))  Urine culture     Status: Abnormal   Collection Time: 06/06/20  7:05 PM   Specimen: Urine, Random  Result Value Ref Range Status   Specimen Description URINE, RANDOM  Final   Special Requests   Final    NONE Performed at [facility], [address], [place], [place] [address]    Culture >=100,000 COLONIES/mL KLEBSIELLA PNEUMONIAE (A)  Final   Report Status 06/09/2020 FINAL  Final   Organism ID, Bacteria KLEBSIELLA PNEUMONIAE (A)  Final      Susceptibility   Klebsiella pneumoniae - MIC*    AMPICILLIN >=32 RESISTANT Resistant     CEFAZOLIN <=4 SENSITIVE Sensitive     CEFTRIAXONE <=0.25 SENSITIVE Sensitive     CIPROFLOXACIN <=0.25 SENSITIVE Sensitive     GENTAMICIN <=1 SENSITIVE Sensitive     IMIPENEM <=0.25 SENSITIVE Sensitive     NITROFURANTOIN <=16 SENSITIVE Sensitive     TRIMETH/SULFA <=20 SENSITIVE Sensitive     AMPICILLIN/SULBACTAM 8 SENSITIVE Sensitive     PIP/TAZO <=4 SENSITIVE Sensitive     * >=100,000 COLONIES/mL KLEBSIELLA PNEUMONIAE  SARS Coronavirus 2 by RT PCR (hospital order, performed in [facility] lab) Nasopharyngeal Nasopharyngeal Swab     Status: None   Collection Time: 06/07/20  2:22 AM   Specimen: Nasopharyngeal Swab  Result Value Ref Range Status   SARS Coronavirus 2 NEGATIVE NEGATIVE Final    Comment: (NOTE) SARS-CoV-2 target nucleic acids are NOT DETECTED.  The SARS-CoV-2 RNA is generally detectable in upper and lower respiratory specimens during the acute phase of infection. The lowest concentration of SARS-CoV-2 viral copies this assay can detect is 250 copies / mL. A negative result does  not preclude SARS-CoV-2 infection and should not be used as the sole basis for treatment or other patient management decisions.  A negative result may occur with improper specimen collection / handling, submission of specimen other than nasopharyngeal swab, presence of viral mutation(s) within the areas targeted by this assay, and inadequate number of viral copies (<250 copies / mL). A negative result must be combined with clinical observations, patient history, and epidemiological information.  Fact Sheet for Patients:   StrictlyIdeas.no  Fact Sheet for Healthcare Providers: BankingDealers.co.za  This test is not yet approved or  cleared by the [place] FDA and has been authorized for detection and/or diagnosis of SARS-CoV-2 by FDA under an Emergency Use Authorization (EUA).  This EUA will remain in effect (meaning this test can be used) for the duration of the COVID-19 declaration under Section 564(b)(1) of the Act, 21 U.S.C. section 360bbb-3(b)(1), unless the authorization is terminated or revoked sooner.  Performed at [facility], [place] [address] [place], [place] [address]     RN Pressure Injury Documentation:     Estimated body mass index is 19.03 kg/m as calculated from the following:   Height as of this encounter: 5\' 4"  (1.626 m).   Weight as of this encounter: 50.3 kg.  Malnutrition Type:      Malnutrition Characteristics:      Nutrition Interventions:  Radiology Studies: No results found. Scheduled Meds:   stroke: mapping our early stages of recovery book   Does not apply Once   aspirin  300 mg Rectal Daily   Or   aspirin  325 mg Oral Daily   atenolol  50 mg Oral Daily   cephALEXin  500 mg Oral Q12H   enoxaparin (LOVENOX) injection  30 mg Subcutaneous Q24H   levothyroxine  50 mcg Oral QAC breakfast   pravastatin  40 mg Oral Daily   Continuous Infusions:   LOS: 4 days   Kerney Elbe, DO [facility] PAGER is on AMION  If 7PM-7AM, please contact night-coverage [email or web address]

## 2020-06-11 NOTE — TOC Progression Note (Signed)
Transition of Care [facility]) - Progression Note    Patient Details  Name: Valerie Young MRN: [id number] Date of Birth: 1929-03-01  Transition of Care [facility]) CM/SW [place], [place] Phone Number: 06/11/2020, 2:24 PM  Clinical Narrative:    CSW contacted patient's daughter Valerie Young to provide bed offers. Terri requested offers be sent to her via e-mail. CSW emailed offers and will follow-up.    Expected Discharge Plan: [place] Barriers to Discharge: Continued Medical Work up  Expected Discharge Plan and Services Expected Discharge Plan: [place] arrangements for the past 2 months: Apartment                                       Social Determinants of Health (SDOH) Interventions    Readmission Risk Interventions No flowsheet data found.

## 2020-06-12 ENCOUNTER — Inpatient Hospital Stay (HOSPITAL_COMMUNITY): Payer: Medicare Other

## 2020-06-12 ENCOUNTER — Encounter (HOSPITAL_COMMUNITY)
Admission: EM | Disposition: A | Discharge status: Skilled Nursing Facility | Payer: Self-pay | Source: Home / Self Care | Attending: Internal Medicine

## 2020-06-12 DIAGNOSIS — I639 Cerebral infarction, unspecified: Secondary | ICD-10-CM

## 2020-06-12 HISTORY — PX: LOOP RECORDER INSERTION: EP1214

## 2020-06-12 LAB — CBC WITH DIFFERENTIAL/PLATELET
Abs Immature Granulocytes: 0.05 10*3/uL (ref 0.00–0.07)
Abs Immature Granulocytes: 0.04 10*3/uL (ref 0.00–0.07)
Basophils Absolute: 0 10*3/uL (ref 0.0–0.1)
Basophils Absolute: 0 10*3/uL (ref 0.0–0.1)
Basophils Relative: 0 %
Basophils Relative: 0 %
Eosinophils Absolute: 0.2 10*3/uL (ref 0.0–0.5)
Eosinophils Absolute: 0.1 10*3/uL (ref 0.0–0.5)
Eosinophils Relative: 2 %
Eosinophils Relative: 1 %
HCT: 40.6 % (ref 36.0–46.0)
HCT: 38.7 % (ref 36.0–46.0)
Hemoglobin: 12.8 g/dL (ref 12.0–15.0)
Hemoglobin: 12.4 g/dL (ref 12.0–15.0)
Immature Granulocytes: 1 %
Immature Granulocytes: 0 %
Lymphocytes Relative: 15 %
Lymphocytes Relative: 13 %
Lymphs Abs: 1.6 10*3/uL (ref 0.7–4.0)
Lymphs Abs: 1.3 10*3/uL (ref 0.7–4.0)
MCH: 32.4 pg (ref 26.0–34.0)
MCH: 32.5 pg (ref 26.0–34.0)
MCHC: 31.5 g/dL (ref 30.0–36.0)
MCHC: 32 g/dL (ref 30.0–36.0)
MCV: 102.8 fL — ABNORMAL HIGH (ref 80.0–100.0)
MCV: 101.3 fL — ABNORMAL HIGH (ref 80.0–100.0)
Monocytes Absolute: 1.3 10*3/uL — ABNORMAL HIGH (ref 0.1–1.0)
Monocytes Absolute: 1.1 10*3/uL — ABNORMAL HIGH (ref 0.1–1.0)
Monocytes Relative: 13 %
Monocytes Relative: 11 %
Neutro Abs: 7.2 10*3/uL (ref 1.7–7.7)
Neutro Abs: 7.6 10*3/uL (ref 1.7–7.7)
Neutrophils Relative %: 69 %
Neutrophils Relative %: 75 %
Platelets: 227 10*3/uL (ref 150–400)
Platelets: 239 10*3/uL (ref 150–400)
RBC: 3.95 MIL/uL (ref 3.87–5.11)
RBC: 3.82 MIL/uL — ABNORMAL LOW (ref 3.87–5.11)
RDW: 12.3 % (ref 11.5–15.5)
RDW: 12.2 % (ref 11.5–15.5)
WBC: 10.4 10*3/uL (ref 4.0–10.5)
WBC: 10.2 10*3/uL (ref 4.0–10.5)
nRBC: 0 % (ref 0.0–0.2)
nRBC: 0 % (ref 0.0–0.2)

## 2020-06-12 LAB — COMPREHENSIVE METABOLIC PANEL
ALT: 12 U/L (ref 0–44)
AST: 22 U/L (ref 15–41)
Albumin: 3.1 g/dL — ABNORMAL LOW (ref 3.5–5.0)
Alkaline Phosphatase: 59 U/L (ref 38–126)
Anion gap: 10 (ref 5–15)
BUN: 20 mg/dL (ref 8–23)
CO2: 24 mmol/L (ref 22–32)
Calcium: 9.2 mg/dL (ref 8.9–10.3)
Chloride: 105 mmol/L (ref 98–111)
Creatinine, Ser: 1.14 mg/dL — ABNORMAL HIGH (ref 0.44–1.00)
GFR calc Af Amer: 49 mL/min — ABNORMAL LOW (ref >60)
GFR calc non Af Amer: 43 mL/min — ABNORMAL LOW (ref >60)
Glucose, Bld: 109 mg/dL — ABNORMAL HIGH (ref 70–99)
Potassium: 3.8 mmol/L (ref 3.5–5.1)
Sodium: 139 mmol/L (ref 135–145)
Total Bilirubin: 0.7 mg/dL (ref 0.3–1.2)
Total Protein: 6.2 g/dL — ABNORMAL LOW (ref 6.5–8.1)

## 2020-06-12 LAB — MAGNESIUM: Magnesium: 2 mg/dL (ref 1.7–2.4)

## 2020-06-12 LAB — SARS CORONAVIRUS 2 BY RT PCR (HOSPITAL ORDER, PERFORMED IN ~~LOC~~ HOSPITAL LAB): SARS Coronavirus 2: NEGATIVE

## 2020-06-12 LAB — PHOSPHORUS: Phosphorus: 2.8 mg/dL (ref 2.5–4.6)

## 2020-06-12 SURGERY — LOOP RECORDER INSERTION
Anesthesia: LOCAL

## 2020-06-12 MED ORDER — CLOPIDOGREL BISULFATE 75 MG PO TABS
75.0000 mg | ORAL_TABLET | Freq: Every day | ORAL | Status: DC
  Administered 2020-06-13: 75 mg via ORAL
  Filled 2020-06-12: qty 1

## 2020-06-12 MED ORDER — STROKE: EARLY STAGES OF RECOVERY BOOK: 1.0000 | Freq: Once | 0 refills | Status: AC

## 2020-06-12 MED ORDER — ASPIRIN EC 81 MG PO TBEC
81.0000 mg | DELAYED_RELEASE_TABLET | Freq: Every day | ORAL | 2 refills | Status: AC
Start: 2020-06-12 — End: 2021-06-12

## 2020-06-12 MED ORDER — ASPIRIN EC 81 MG PO TBEC
81.0000 mg | DELAYED_RELEASE_TABLET | Freq: Every day | ORAL | Status: DC
  Administered 2020-06-13: 81 mg via ORAL
  Filled 2020-06-12: qty 1

## 2020-06-12 MED ORDER — ONDANSETRON HCL 4 MG PO TABS: 4.0000 mg | ORAL_TABLET | Freq: Four times a day (QID) | ORAL | 0 refills | Status: DC | PRN

## 2020-06-12 MED ORDER — LIDOCAINE-EPINEPHRINE 1 %-1:100000 IJ SOLN
INTRAMUSCULAR | Status: DC | PRN
  Administered 2020-06-12: 20 mL

## 2020-06-12 MED ORDER — CEPHALEXIN 500 MG PO CAPS: 500.0000 mg | ORAL_CAPSULE | Freq: Two times a day (BID) | ORAL | 0 refills | Status: DC

## 2020-06-12 MED ORDER — CEPHALEXIN 500 MG PO CAPS: 500.0000 mg | ORAL_CAPSULE | Freq: Two times a day (BID) | ORAL | 0 refills | Status: AC

## 2020-06-12 MED ORDER — LIDOCAINE-EPINEPHRINE 1 %-1:100000 IJ SOLN
INTRAMUSCULAR | Status: AC
  Filled 2020-06-12: qty 1

## 2020-06-12 MED ORDER — POLYETHYLENE GLYCOL 3350 17 G PO PACK: 17.0000 g | PACK | Freq: Every day | ORAL | 0 refills | Status: DC | PRN

## 2020-06-12 MED ORDER — CLOPIDOGREL BISULFATE 75 MG PO TABS
75.0000 mg | ORAL_TABLET | Freq: Every day | ORAL | 0 refills | Status: AC
Start: 2020-06-12 — End: 2020-07-03

## 2020-06-12 MED ORDER — GUAIFENESIN ER 600 MG PO TB12
1200.0000 mg | ORAL_TABLET | Freq: Two times a day (BID) | ORAL | Status: DC
  Administered 2020-06-12 – 2020-06-13 (×2): 1200 mg via ORAL
  Filled 2020-06-12 (×2): qty 2

## 2020-06-12 SURGICAL SUPPLY — 2 items
MONITOR REVEAL LINQ II (Prosthesis & Implant Heart) ×2 IMPLANT
PACK LOOP INSERTION (CUSTOM PROCEDURE TRAY) ×2 IMPLANT

## 2020-06-12 NOTE — Discharge Instructions (Signed)

## 2020-06-12 NOTE — TOC Progression Note (Addendum)
Transition of Care [facility]) - Progression Note    Patient Details  Name: DENIECE RANKIN MRN: [id number] Date of Birth: [birth year]  Transition of Care [facility]) CM/SW [place], [place] Phone Number: 06/12/2020, 9:35 AM  Clinical Narrative:    1:45p CSW contacted by patient's daughter Karna Christmas and informed family would like patient to discharge to [facility].  CSW contacted [facility] and confirmed they are able to take patient today. CSW attempted to reach out to Terri and left a voicemail, awaiting a call back.   [facility] contacted by cardiology and informed they will not be able to place loop recorder until a snf bed is chosen and available. They will then plan for loop recorder, right before discharge.   CSW contacted by MD and informed patient is medically stable for discharge. CSW contacted patient's daughter Ms.Benjaman Pott to follow-up on SNF choice. CSW informed the family has not had a chance to review the SNFs and patient's son Abe People will follow-up with CSW to provide choice.    Expected Discharge Plan: [place] Barriers to Discharge: Continued Medical Work up  Expected Discharge Plan and Services Expected Discharge Plan: [place] arrangements for the past 2 months: Apartment                                       Social Determinants of Health (SDOH) Interventions    Readmission Risk Interventions No flowsheet data found.

## 2020-06-12 NOTE — Consult Note (Addendum)
ELECTROPHYSIOLOGY CONSULT NOTE  Patient ID: Valerie Young MRN: [id number], DOB/AGE: [id number]   Admit date: 06/06/2020 Date of Consult: 06/12/2020  Primary Physician: Collene Gobble, MD Primary Cardiologist: No primary care provider on file.  Primary Electrophysiologist: New to [facility] Reason for Consultation: Cryptogenic stroke; recommendations regarding Implantable Loop Recorder Insurance: Medicare  History of Present Illness EP has been asked to evaluate Valerie Young for placement of an implantable loop recorder to monitor for atrial fibrillation by Dr Erlinda Hong.  The patient was admitted on 06/06/2020 with confusion, nausea and vomiting and UTI. Once pts confusion improved, she reported she had "eye deviation" several days ago, and thus stroke work up was initiated as well. Imaging demonstrated acute ischemic CVA with left PCA-P2 segment occlusion.  They have undergone workup for stroke including echocardiogram, LE dopplers and CTA Head and Neck.  The patient has been monitored on telemetry which has demonstrated sinus rhythm with no arrhythmias.  Inpatient stroke work-up will not require a TEE per Neurology.   Pt also noted to have a Klebsiella UTI present on admission. Pansensitive -> given Keflex.   Echocardiogram this admission demonstrated 60-65%.  Lab work is reviewed.  Prior to admission, the patient denies chest pain, shortness of breath, dizziness, palpitations, or syncope.  They are recovering from their stroke with plans to rehab at SNF  at discharge.  Past Medical History:  Diagnosis Date  . 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 Cancer ([place])    right lung ca - 30 years ago / left lung - 11/2012  . Cancer ([place])    Left lung  . CKD (chronic kidney disease)   . COPD (chronic obstructive pulmonary disease) ([place])   . DJD (degenerative joint disease)    knees  . H/O: lung cancer 1982   Right; treated with resection  . Hypertension   . Hypothyroidism   . PND  (post-nasal drip)    Chronic     Surgical History:  Past Surgical History:  Procedure Laterality Date  . ABDOMINAL HYSTERECTOMY     PARTIAL  . CATARACT EXTRACTION, BILATERAL    . JOINT REPLACEMENT     RIGHT TOTAL KNEE  . RADIATION OF LEFT LUNG    . right lung cancer treated with resection  1982  . right thigh postoperative hematoma  02-2008  . RIGHT UPPER LOBE REMOVED    . THYROID SURGERY    . THYROIDECTOMY    . TOTAL KNEE ARTHROPLASTY     Right  . VESICOVAGINAL FISTULA CLOSURE W/ TAH    . VIDEO BRONCHOSCOPY  12/08/2012   Procedure: VIDEO BRONCHOSCOPY WITH FLUORO;  Surgeon: Kathee Delton, MD;  Location: [facility];  Service: Cardiopulmonary;  Laterality: Bilateral;     Medications Prior to Admission  Medication Sig Dispense Refill Last Dose  . acetaminophen (TYLENOL) 325 MG tablet Take 650 mg by mouth 3 (three) times daily. TWO TO THREE TIMES A DAY   06/06/2020 at Unknown time  . albuterol (PROVENTIL HFA;VENTOLIN HFA) 108 (90 Base) MCG/ACT inhaler Inhale 2 puffs into the lungs every 6 (six) hours as needed for wheezing or shortness of breath. 1 Inhaler 5 unknown  . atenolol (TENORMIN) 50 MG tablet Take 1 tablet (50 mg total) by mouth daily. 90 tablet 3 06/06/2020 at 9am  . atorvastatin (LIPITOR) 10 MG tablet Take 10 mg by mouth daily.   06/06/2020 at Unknown time  . Cholecalciferol (VITAMIN D-3) 1000 units CAPS Take 3,000 Units by  mouth daily.   06/06/2020 at Unknown time  . levothyroxine (SYNTHROID, LEVOTHROID) 50 MCG tablet Take 50 mcg by mouth daily before breakfast.   8 06/06/2020 at Unknown time  . amLODipine (NORVASC) 5 MG tablet TAKE 1 TABLET (5 MG TOTAL) BY MOUTH AT BEDTIME. (Patient taking differently: Take 5 mg by mouth daily. ) 90 tablet 0 unknown  . cetirizine (ZYRTEC) 10 MG tablet TAKE 1 TABLET (10 MG TOTAL) BY MOUTH AT BEDTIME. (Patient taking differently: Take 10 mg by mouth daily. ) 30 tablet 2 unknown  . guaiFENesin (MUCINEX) 600 MG 12 hr tablet Take 2 tablets (1,200  mg total) by mouth 2 (two) times daily. 20 tablet 0 unknown  . polyvinyl alcohol (LIQUIFILM TEARS) 1.4 % ophthalmic solution Place 1 drop into both eyes as needed for dry eyes.   unknown  . pravastatin (PRAVACHOL) 40 MG tablet Take 40 mg by mouth daily.  3 unknown    Inpatient Medications:  .  stroke: mapping our early stages of recovery book   Does not apply Once  . aspirin  300 mg Rectal Daily   Or  . aspirin  325 mg Oral Daily  . atenolol  50 mg Oral Daily  . cephALEXin  500 mg Oral Q12H  . enoxaparin (LOVENOX) injection  30 mg Subcutaneous Q24H  . levothyroxine  50 mcg Oral QAC breakfast  . pravastatin  40 mg Oral Daily    Allergies:  Allergies  Allergen Reactions  . Sulfamethoxazole-Trimethoprim Rash    Social History   Socioeconomic History  . Marital status: Divorced    Spouse name: Not on file  . Number of children: Not on file  . Years of education: Not on file  . Highest education level: Not on file  Occupational History  . Occupation: Retired  Tobacco Use  . Smoking status: Former Smoker    Packs/day: 1.00    Years: 35.00    Pack years: 35.00    Types: Cigarettes    Quit date: 11/25/1980    Years since quitting: 39.5  . Smokeless tobacco: Never Used  Vaping Use  . Vaping Use: Never used  Substance and Sexual Activity  . Alcohol use: No  . Drug use: No  . Sexual activity: Never    Birth control/protection: Surgical  Other Topics Concern  . Not on file  Social History Narrative   ** Merged History Encounter **       Social Determinants of Health   Financial Resource Strain:   . Difficulty of Paying Living Expenses:   Food Insecurity:   . Worried About Charity fundraiser in the Last Year:   . Arboriculturist in the Last Year:   Transportation Needs:   . Film/video editor (Medical):   [name] Lack of Transportation (Non-Medical):   Physical Activity:   . Days of Exercise per Week:   . Minutes of Exercise per Session:   Stress:   . Feeling of  Stress :   Social Connections:   . Frequency of Communication with Friends and Family:   . Frequency of Social Gatherings with Friends and Family:   . Attends Religious Services:   . Active Member of Clubs or Organizations:   . Attends Archivist Meetings:   [name] Marital Status:   Intimate Partner Violence:   . Fear of Current or Ex-Partner:   . Emotionally Abused:   [name] Physically Abused:   . Sexually Abused:      Family  History  Problem Relation Age of Onset  . CVA Mother 43       hemorrhagic       Review of Systems: All other systems reviewed and are otherwise negative except as noted above.  Physical Exam: Vitals:   06/11/20 1716 06/11/20 2218 06/12/20 0500 06/12/20 0829  BP: (!) 155/73 (!) 147/71  (!) 153/58  Pulse: 62 66  63  Resp: 19 18  20   Temp: 98.3 F (36.8 C) 98.9 F (37.2 C)  98.1 F (36.7 C)  TempSrc:  Oral    SpO2: 97% 96%  97%  Weight:   52 kg   Height:        GEN- The patient is well appearing, alert and oriented x 3 today.   Head- normocephalic, atraumatic Eyes-  Sclera clear, conjunctiva pink Ears- hearing intact Oropharynx- clear Neck- supple Lungs- Clear to ausculation bilaterally, normal work of breathing Heart- Regular rate and rhythm, no murmurs, rubs or gallops  GI- soft, NT, ND, + BS Extremities- no clubbing, cyanosis, or edema MS- no significant deformity or atrophy Skin- no rash or lesion Psych- euthymic mood, full affect   Labs:   Lab Results  Component Value Date   WBC 10.4 06/12/2020   HGB 12.8 06/12/2020   HCT 40.6 06/12/2020   MCV 102.8 (H) 06/12/2020   PLT 227 06/12/2020    Recent Labs  Lab 06/12/20 0609  NA 139  K 3.8  CL 105  CO2 24  BUN 20  CREATININE 1.14*  CALCIUM 9.2  PROT 6.2*  BILITOT 0.7  ALKPHOS 59  ALT 12  AST 22  GLUCOSE 109*     Radiology/Studies: EEG  Result Date: 06/08/2020 Lora Havens, MD     06/08/2020 12:20 PM Patient Name: ALEXXUS SOBH MRN: [id number] Epilepsy  Attending: Lora Havens Referring Physician/Provider: Dr Baird Kay Date: 06/08/2020 Duration: 24.35 mins Patient history: 86yo F with abnormal eye movements and left eye was deviated to the left with nystagmus. EEG to evaluate for seizure. Level of alertness: Awake, drowsy AEDs during EEG study: None Technical aspects: This EEG study was done with scalp electrodes positioned according to the 10-20 International system of electrode placement. Electrical activity was acquired at a sampling rate of 500Hz  and reviewed with a high frequency filter of 70Hz  and a low frequency filter of 1Hz . EEG data were recorded continuously and digitally stored. Description: The posterior dominant rhythm consists of 8 Hz activity of moderate voltage (25-35 uV) seen predominantly in posterior head regions, symmetric and reactive to eye opening and eye closing. Drowsiness was characterized by attenuation of the posterior background rhythm.  EEG showed intermittent left temporal 3 to 6 Hz theta-delta slowing.  Hyperventilation and photic stimulation were not performed.   ABNORMALITY -Intermittent slow, left temporal region IMPRESSION: This study is suggestive of non specific cortical dysfunction in left temporal region.  No seizures or epileptiform discharges were seen throughout the recording. Lora Havens   DG Chest 2 View  Result Date: 06/06/2020 CLINICAL DATA:  Weakness, malaise EXAM: CHEST - 2 VIEW COMPARISON:  10/09/2018 FINDINGS: Surgical clips are seen within the right hilum in surgical staple line is seen within the right suprahilar region likely related to right upper lobectomy. There is chronic collapse and consolidation of the left upper lobe with associated left-sided volume loss. No superimposed confluent pulmonary infiltrate. No pneumothorax or pleural effusion. Cardiac size within normal limits. The pulmonary vascularity is normal. No acute bone abnormality. IMPRESSION: Stable examination  with chronic collapse  of the left upper lobe and probable surgical changes of right upper lobectomy. No acute cardiopulmonary disease. Electronically Signed   By: Fidela Salisbury MD   On: 06/06/2020 22:29   DG Abd 1 View  Result Date: 06/07/2020 CLINICAL DATA:  86 year old female with nausea and vomiting. EXAM: ABDOMEN - 1 VIEW COMPARISON:  CT abdomen pelvis dated 04/09/2017. FINDINGS: There is no bowel dilatation or evidence of obstruction. Colonic diverticulosis noted. No free air or radiopaque calculi. There is degenerative changes of the spine and scoliosis. No acute osseous pathology. IMPRESSION: Colonic diverticulosis.  No evidence of bowel obstruction. Electronically Signed   By: Anner Crete M.D.   On: 06/07/2020 03:26   CT HEAD WO CONTRAST  Result Date: 06/06/2020 CLINICAL DATA:  86 year old female with altered mental status. EXAM: CT HEAD WITHOUT CONTRAST TECHNIQUE: Contiguous axial images were obtained from the base of the skull through the vertex without intravenous contrast. COMPARISON:  None. FINDINGS: Brain: Moderate age-related atrophy and chronic microvascular ischemic changes. See there is no acute intracranial hemorrhage. No mass effect or midline shift. No extra-axial fluid collection. Vascular: No hyperdense vessel or unexpected calcification. Skull: Normal. Negative for fracture or focal lesion. Sinuses/Orbits: No acute finding. Other: None IMPRESSION: 1. No acute intracranial pathology. 2. Moderate age-related atrophy and chronic microvascular ischemic changes. Electronically Signed   By: Anner Crete M.D.   On: 06/06/2020 22:36   CT ANGIO NECK W OR WO CONTRAST  Result Date: 06/08/2020 CLINICAL DATA:  Stroke. EXAM: CT ANGIOGRAPHY NECK TECHNIQUE: Multidetector CT imaging of the neck was performed using the standard protocol during bolus administration of intravenous contrast. Multiplanar CT image reconstructions and MIPs were obtained to evaluate the vascular anatomy. Carotid stenosis measurements  (when applicable) are obtained utilizing NASCET criteria, using the distal internal carotid diameter as the denominator. CONTRAST:  70mL OMNIPAQUE IOHEXOL 350 MG/ML SOLN COMPARISON:  MRI head and MRA head 06/07/2020 FINDINGS: Aortic arch: Atherosclerotic disease aortic arch and proximal great vessels. Proximal great vessels are adequately patent. Mild stenosis in the innominate artery. Right carotid system: Calcified and noncalcified plaque right carotid bifurcation without significant stenosis or thrombus. Left carotid system: Mild atherosclerotic disease left common carotid artery without significant stenosis. Mild atherosclerotic disease left carotid bifurcation without significant stenosis or thrombus. Vertebral arteries: Right vertebral artery has 2 separate origins from the right subclavian artery. These join at the C4-5 level. No significant right vertebral artery stenosis. Right PICA patent. Right AICA patent. Mild stenosis at the origin of the left vertebral artery. Left vertebral artery is then patent to the basilar without significant additional stenosis. Left PICA not visualized. Left AICA is patent. Skeleton: Disc and facet degeneration throughout the cervical spine without acute skeletal abnormality. Other neck: No soft tissue mass or adenopathy. Upper chest: Left upper lobe partial collapse/scarring. This is chronic and unchanged from CT chest 04/29/2018 and may be related to prior radiation. IMPRESSION: 1. Atherosclerotic disease of the carotid bifurcation bilaterally without significant carotid stenosis. 2. No significant vertebral artery stenosis. Mild stenosis origin of left vertebral artery. Right PICA patent. Left PICA not visualized. Bilateral AICA patent. 3. Post radiation changes left upper lobe. 4.  Aortic Atherosclerosis (ICD10-I70.0). Electronically Signed   By: Franchot Gallo M.D.   On: 06/08/2020 14:05   CT ANGIO CHEST PE W OR WO CONTRAST  Result Date: 06/09/2020 CLINICAL DATA:   Shortness of breath. Lung nodule. History of lung cancer. Stroke. EXAM: CT ANGIOGRAPHY CHEST CT ABDOMEN AND PELVIS WITH  CONTRAST TECHNIQUE: Multidetector CT imaging of the chest was performed using the standard protocol during bolus administration of intravenous contrast. Multiplanar CT image reconstructions and MIPs were obtained to evaluate the vascular anatomy. Multidetector CT imaging of the abdomen and pelvis was performed using the standard protocol during bolus administration of intravenous contrast. CONTRAST:  93mL OMNIPAQUE IOHEXOL 350 MG/ML SOLN COMPARISON:  Most recent chest CT 10/29/2018. FINDINGS: CTA CHEST FINDINGS Cardiovascular: There are no filling defects within the pulmonary arteries to suggest pulmonary embolus. The left upper lobe pulmonary arteries are tree attic. Atherosclerosis of the thoracic aorta. Limited aortic assessment given phase of contrast tailored to pulmonary artery evaluation. The heart is normal in size. Coronary artery calcifications. No pericardial effusion. Mediastinum/Nodes: No enlarged mediastinal or hilar lymph nodes. Left suprahilar assessment is slightly limited due to chronic consolidation. No visualized thyroid nodule. No esophageal wall thickening. Lungs/Pleura: Prior right upper lobectomy. 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 This is stable from 2019. Medial segment of the left lower lobe demonstrates areas of irregular scarring and bronchiectasis. Some subpleural nodularity in this region measures 14 x 10 mm, similar to prior. There is subpleural nodule in the left lower lobe mean diameter 5 mm that is new/increased in size from prior exam, series 6, image 65. Patchy subpleural ground-glass in the anterior right middle lobe appears chronic and may be post treatment related change. Subpleural area of consolidation in the medial right lower lobe has increased in size and density currently measuring approximately 2.3  x 1.3 cm, series 6, image 65 there is internal bronchogram. Previously there was ground-glass opacity in this region. 3 mm nodule in the central right lung, series 6, image 53 not definitively seen on prior. Underlying emphysema. Trace right pleural thickening dependently. No significant pleural effusion. Underlying trachea and central bronchi are patent. Musculoskeletal: Postsurgical change in the right hemithorax. No evidence of focal bone lesion. Chronic thinning of left anterior ribs likely post treatment related. Review of the MIP images confirms the above findings. CT ABDOMEN and PELVIS FINDINGS Hepatobiliary: No focal liver abnormality is seen. No gallstones, gallbladder wall thickening, or biliary dilatation. Pancreas: Slight prominence of the pancreatic duct without significant ductal dilatation. No peripancreatic inflammation or evidence of mass. Spleen: Normal in size without focal abnormality. Adrenals/Urinary Tract: No adrenal nodule. Bilateral renal parenchymal thinning and areas of scarring, more prominent on the left. No hydronephrosis. No evidence of focal renal lesion. Symmetric excretion on delayed phase imaging. Urinary bladder is partially distended, equivocal bladder wall thickening. Stomach/Bowel: Stomach is unremarkable. Administered enteric contrast is seen within the distal small bowel and throughout the colon. No obstruction. No bowel inflammation. Normal appendix filled with contrast. Diverticulosis of the descending and sigmoid colon without diverticulitis. Vascular/Lymphatic: Aorto bi-iliac atherosclerosis. No aortic aneurysm. Patent portal vein. No abdominopelvic adenopathy. Reproductive: Status post hysterectomy. No adnexal masses. Other: No ascites. No free air. Small fat containing umbilical hernia. Musculoskeletal: No focal bone lesion. Scoliosis and degenerative change throughout the spine. Degenerative change of both hips. Review of the MIP images confirms the above findings.  IMPRESSION: Chest CTA: 1. No pulmonary embolus. 2. Post right upper lobectomy. 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 3. Previous left lower lobe subpleural nodule in the medial left lung base measures 14 x 10 mm, previously 12 x 9 mm in 2019. Lack of significant interval change favors post treatment related change, however not definitively characterized. 4. Subpleural  area of consolidation in the medial right lower lobe has increased in size and density from 2019 when it was ground-glass. This is indeterminate. 5. New/increased 5 mm subpleural nodule in the left lower lobe. 3 mm nodule in the central right lung, not definitively seen on prior exam. 6. PET CT may be helpful for evaluation of metabolic activity of the larger lesions. Smaller nodules are too small for PET characterization. CT abdomen/pelvis: 1. No acute abnormality in the abdomen/pelvis. No evidence of abdominopelvic metastatic disease. 2. Colonic diverticulosis without diverticulitis. Aortic Atherosclerosis (ICD10-I70.0) and Emphysema (ICD10-J43.9). Electronically Signed   By: Keith Rake M.D.   On: 06/09/2020 15:46   MR ANGIO HEAD WO CONTRAST  Addendum Date: 06/07/2020   ADDENDUM REPORT: 06/07/2020 16:56 ADDENDUM: These results were called by telephone at the time of interpretation on 06/07/2020 at 4:56 pm to provider Dr. Alfredia Ferguson , who verbally acknowledged these results. Electronically Signed   By: Kellie Simmering DO   On: 06/07/2020 16:56   Result Date: 06/07/2020 CLINICAL DATA:  Stroke, follow-up. EXAM: MRA HEAD WITHOUT CONTRAST TECHNIQUE: Angiographic images of the Circle of Willis were obtained using MRA technique without intravenous contrast. COMPARISON:  Brain MRI performed earlier the same day 06/07/2020, FINDINGS: The intracranial internal carotid arteries are patent without significant stenosis. The M1 middle cerebral arteries are patent without significant  stenosis. No M2 proximal branch occlusion or high-grade proximal stenosis is identified. The anterior cerebral arteries are patent. No intracranial aneurysm is identified. The intracranial vertebral arteries are patent without significant stenosis. The left PICA is poorly delineated proximally. The basilar artery is patent without significant stenosis. The right posterior cerebral artery is patent without significant proximal stenosis. There is occlusion of the left posterior cerebral artery at the level of the distal P2 segment. A right posterior communicating artery is present. The left posterior communicating artery is hypoplastic or absent. 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 The proximal left PICA is poorly delineated. This may be due to small vessel size, high-grade stenosis or partial occlusion. No anterior circulation large vessel occlusion or proximal high-grade arterial stenosis. Electronically Signed: By: Kellie Simmering DO On: 06/07/2020 16:34   MR BRAIN WO CONTRAST  Result Date: 06/07/2020 CLINICAL DATA:  Encephalopathy. EXAM: MRI HEAD WITHOUT CONTRAST TECHNIQUE: Multiplanar, multiecho pulse sequences of the brain and surrounding structures were obtained without intravenous contrast. COMPARISON:  06/06/2020 head CT. FINDINGS: Brain: Multifocal restricted diffusion involving the medial left occipital lobe with extension into the left hippocampal tail and body. Restricted diffusion involving the bilateral cerebellum with tiny foci of restricted diffusion involving the left superior cerebellar peduncle, dorsal left thalamus (5:74) and dorsomedial right mid brain (5:69, 67). All of the aforementioned acute infarcts demonstrate correlative T2/FLAIR hyperintense signal and edema. Bilateral hippocampal sulcal remnant cysts. Moderate cerebral atrophy with ex vacuo dilatation. No midline shift, mass lesion or extra-axial fluid collection. Background  scattered and confluent T2/FLAIR supratentorial white matter foci reflect chronic microvascular ischemic changes. Remote right pontine microhemorrhage. Vascular: Normal flow voids. Skull and upper cervical spine: Normal marrow signal. Sinuses/Orbits: Normal orbits. Sequela of bilateral lens replacement. Clear paranasal sinuses. No mastoid effusion. Other: None. IMPRESSION: Multifocal acute infarcts involving the left occipital lobe, left hippocampus, dorsal left thalamus, bilateral cerebellum, dorsomedial right mid brain and left superior cerebellar peduncle. Moderate cerebral atrophy. Moderate to severe chronic microvascular ischemic changes. These results were called by telephone at the time of interpretation on 06/07/2020 at 4:04  pm to provider [facility] , who verbally acknowledged these results. Electronically Signed   By: Primitivo Gauze M.D.   On: 06/07/2020 16:11   CT ABDOMEN PELVIS W CONTRAST  Result Date: 06/09/2020 CLINICAL DATA:  Shortness of breath. Lung nodule. History of lung cancer. Stroke. EXAM: CT ANGIOGRAPHY CHEST CT ABDOMEN AND PELVIS WITH CONTRAST TECHNIQUE: Multidetector CT imaging of the chest was performed using the standard protocol during bolus administration of intravenous contrast. Multiplanar CT image reconstructions and MIPs were obtained to evaluate the vascular anatomy. Multidetector CT imaging of the abdomen and pelvis was performed using the standard protocol during bolus administration of intravenous contrast. CONTRAST:  74mL OMNIPAQUE IOHEXOL 350 MG/ML SOLN COMPARISON:  Most recent chest CT 10/29/2018. FINDINGS: CTA CHEST FINDINGS Cardiovascular: There are no filling defects within the pulmonary arteries to suggest pulmonary embolus. The left upper lobe pulmonary arteries are tree attic. Atherosclerosis of the thoracic aorta. Limited aortic assessment given phase of contrast tailored to pulmonary artery evaluation. The heart is normal in size. Coronary artery  calcifications. No pericardial effusion. Mediastinum/Nodes: No enlarged mediastinal or hilar lymph nodes. Left suprahilar assessment is slightly limited due to chronic consolidation. No visualized thyroid nodule. No esophageal wall thickening. Lungs/Pleura: Prior right upper lobectomy. 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 This is stable from 2019. Medial segment of the left lower lobe demonstrates areas of irregular scarring and bronchiectasis. Some subpleural nodularity in this region measures 14 x 10 mm, similar to prior. There is subpleural nodule in the left lower lobe mean diameter 5 mm that is new/increased in size from prior exam, series 6, image 65. Patchy subpleural ground-glass in the anterior right middle lobe appears chronic and may be post treatment related change. Subpleural area of consolidation in the medial right lower lobe has increased in size and density currently measuring approximately 2.3 x 1.3 cm, series 6, image 65 there is internal bronchogram. Previously there was ground-glass opacity in this region. 3 mm nodule in the central right lung, series 6, image 53 not definitively seen on prior. Underlying emphysema. Trace right pleural thickening dependently. No significant pleural effusion. Underlying trachea and central bronchi are patent. Musculoskeletal: Postsurgical change in the right hemithorax. No evidence of focal bone lesion. Chronic thinning of left anterior ribs likely post treatment related. Review of the MIP images confirms the above findings. CT ABDOMEN and PELVIS FINDINGS Hepatobiliary: No focal liver abnormality is seen. No gallstones, gallbladder wall thickening, or biliary dilatation. Pancreas: Slight prominence of the pancreatic duct without significant ductal dilatation. No peripancreatic inflammation or evidence of mass. Spleen: Normal in size without focal abnormality. Adrenals/Urinary Tract: No adrenal nodule. Bilateral  renal parenchymal thinning and areas of scarring, more prominent on the left. No hydronephrosis. No evidence of focal renal lesion. Symmetric excretion on delayed phase imaging. Urinary bladder is partially distended, equivocal bladder wall thickening. Stomach/Bowel: Stomach is unremarkable. Administered enteric contrast is seen within the distal small bowel and throughout the colon. No obstruction. No bowel inflammation. Normal appendix filled with contrast. Diverticulosis of the descending and sigmoid colon without diverticulitis. Vascular/Lymphatic: Aorto bi-iliac atherosclerosis. No aortic aneurysm. Patent portal vein. No abdominopelvic adenopathy. Reproductive: Status post hysterectomy. No adnexal masses. Other: No ascites. No free air. Small fat containing umbilical hernia. Musculoskeletal: No focal bone lesion. Scoliosis and degenerative change throughout the spine. Degenerative change of both hips. Review of the MIP images confirms the above findings. IMPRESSION: Chest CTA: 1. No pulmonary embolus. 2. Post right upper  lobectomy. 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 3. Previous left lower lobe subpleural nodule in the medial left lung base measures 14 x 10 mm, previously 12 x 9 mm in 2019. Lack of significant interval change favors post treatment related change, however not definitively characterized. 4. Subpleural area of consolidation in the medial right lower lobe has increased in size and density from 2019 when it was ground-glass. This is indeterminate. 5. New/increased 5 mm subpleural nodule in the left lower lobe. 3 mm nodule in the central right lung, not definitively seen on prior exam. 6. PET CT may be helpful for evaluation of metabolic activity of the larger lesions. Smaller nodules are too small for PET characterization. CT abdomen/pelvis: 1. No acute abnormality in the abdomen/pelvis. No evidence of abdominopelvic  metastatic disease. 2. Colonic diverticulosis without diverticulitis. Aortic Atherosclerosis (ICD10-I70.0) and Emphysema (ICD10-J43.9). Electronically Signed   By: Keith Rake M.D.   On: 06/09/2020 15:46   ECHOCARDIOGRAM COMPLETE  Result Date: 06/08/2020    ECHOCARDIOGRAM REPORT   Patient Name:   Valerie Young Date of Exam: 06/08/2020 Medical Rec #:  [id number]      Height:       64.0 in Accession #:    [id number]     Weight:       123.6 lb Date of Birth:  1928/12/23      BSA:          1.594 m Patient Age:    17 years       BP:           151/64 mmHg Patient Gender: F              HR:           72 bpm. Exam Location:  Inpatient Procedure: 2D Echo, Cardiac Doppler and Color Doppler Indications:    CVA  History:        Patient has prior history of Echocardiogram examinations, most                 recent 11/23/2013. COPD; Risk Factors:Hypertension and                 Dyslipidemia. Lung cancer, CKD.  Sonographer:    Dustin Flock Referring Phys: [place]  1. Left ventricular ejection fraction, by estimation, is 60 to 65%. The left ventricle has normal function. The left ventricle has no regional wall motion abnormalities. There is mild asymmetric left ventricular hypertrophy of the basal-septal segment. Left ventricular diastolic parameters were normal.  2. Right ventricular systolic function is normal. The right ventricular size is normal. There is normal pulmonary artery systolic pressure.  3. The mitral valve is normal in structure. Trivial mitral valve regurgitation.  4. The aortic valve is tricuspid. Aortic valve regurgitation is not visualized. No aortic stenosis is present.  5. The inferior vena cava is normal in size with greater than 50% respiratory variability, suggesting right atrial pressure of 3 mmHg. FINDINGS  Left Ventricle: Left ventricular ejection fraction, by estimation, is 60 to 65%. The left ventricle has normal function. The left ventricle has no regional wall  motion abnormalities. The left ventricular internal cavity size was normal in size. There is  mild asymmetric left ventricular hypertrophy of the basal-septal segment. Left ventricular diastolic parameters were normal. Right Ventricle: The right ventricular size is normal. Right vetricular wall thickness was not assessed. Right ventricular systolic function is normal.  There is normal pulmonary artery systolic pressure. The tricuspid regurgitant velocity is 2.03 m/s, and with an assumed right atrial pressure of 3 mmHg, the estimated right ventricular systolic pressure is 79.0 mmHg. Left Atrium: Left atrial size was normal in size. Right Atrium: Right atrial size was normal in size. Pericardium: There is no evidence of pericardial effusion. Mitral Valve: The mitral valve is normal in structure. Trivial mitral valve regurgitation. Tricuspid Valve: The tricuspid valve is normal in structure. Tricuspid valve regurgitation is trivial. Aortic Valve: The aortic valve is tricuspid. Aortic valve regurgitation is not visualized. No aortic stenosis is present. Pulmonic Valve: The pulmonic valve was not well visualized. Pulmonic valve regurgitation is trivial. Aorta: The aortic root and ascending aorta are structurally normal, with no evidence of dilitation. Venous: The inferior vena cava is normal in size with greater than 50% respiratory variability, suggesting right atrial pressure of 3 mmHg. IAS/Shunts: The interatrial septum was not well visualized.  LEFT VENTRICLE PLAX 2D LVIDd:         3.70 cm  Diastology LVIDs:         2.70 cm  LV e' lateral:   11.30 cm/s LV PW:         1.10 cm  LV E/e' lateral: 7.8 LV IVS:        1.20 cm  LV e' medial:    7.18 cm/s LVOT diam:     1.80 cm  LV E/e' medial:  12.2 LV SV:         47 LV SV Index:   30 LVOT Area:     2.54 cm  RIGHT VENTRICLE RV Basal diam:  2.50 cm RV S prime:     12.70 cm/s TAPSE (M-mode): 2.0 cm LEFT ATRIUM             Index       RIGHT ATRIUM           Index LA diam:         2.90 cm 1.82 cm/m  RA Area:     10.50 cm LA Vol (A2C):   28.2 ml 17.69 ml/m RA Volume:   23.70 ml  14.86 ml/m LA Vol (A4C):   30.3 ml 19.00 ml/m LA Biplane Vol: 31.1 ml 19.50 ml/m  AORTIC VALVE LVOT Vmax:   84.90 cm/s LVOT Vmean:  54.100 cm/s LVOT VTI:    0.186 m  AORTA Ao Root diam: 2.70 cm MITRAL VALVE                TRICUSPID VALVE MV Area (PHT): 4.15 cm     TR Peak grad:   16.5 mmHg MV Decel Time: 183 msec     TR Vmax:        203.00 cm/s MV E velocity: 87.80 cm/s MV A velocity: 103.00 cm/s  SHUNTS MV E/A ratio:  0.85         Systemic VTI:  0.19 m                             Systemic Diam: 1.80 cm Oswaldo Milian MD Electronically signed by Oswaldo Milian MD Signature Date/Time: 06/08/2020/9:11:01 PM    Final    VAS [place] LOWER EXTREMITY VENOUS (DVT)  Result Date: 06/08/2020  Lower Venous DVTStudy Indications: Stroke.  Comparison Study: no prior Performing Technologist: Abram Sander RVS  Examination Guidelines: A complete evaluation includes B-mode imaging, spectral Doppler, color Doppler, and power Doppler as needed of all  accessible portions of each vessel. Bilateral testing is considered an integral part of a complete examination. Limited examinations for reoccurring indications may be performed as noted. The reflux portion of the exam is performed with the patient in reverse Trendelenburg.  +---------+---------------+---------+-----------+----------+--------------+ RIGHT    CompressibilityPhasicitySpontaneityPropertiesThrombus Aging +---------+---------------+---------+-----------+----------+--------------+ CFV      Full           Yes      Yes                                 +---------+---------------+---------+-----------+----------+--------------+ SFJ      Full                                                        +---------+---------------+---------+-----------+----------+--------------+ FV Prox  Full                                                         +---------+---------------+---------+-----------+----------+--------------+ FV Mid   Full                                                        +---------+---------------+---------+-----------+----------+--------------+ FV DistalFull                                                        +---------+---------------+---------+-----------+----------+--------------+ PFV      Full                                                        +---------+---------------+---------+-----------+----------+--------------+ POP      Full           Yes      Yes                                 +---------+---------------+---------+-----------+----------+--------------+ PTV      Full                                                        +---------+---------------+---------+-----------+----------+--------------+ PERO     Full                                                        +---------+---------------+---------+-----------+----------+--------------+   +---------+---------------+---------+-----------+----------+--------------+ LEFT  CompressibilityPhasicitySpontaneityPropertiesThrombus Aging +---------+---------------+---------+-----------+----------+--------------+ CFV      Full           Yes      Yes                                 +---------+---------------+---------+-----------+----------+--------------+ SFJ      Full                                                        +---------+---------------+---------+-----------+----------+--------------+ FV Prox  Full                                                        +---------+---------------+---------+-----------+----------+--------------+ FV Mid   Full                                                        +---------+---------------+---------+-----------+----------+--------------+ FV DistalFull                                                         +---------+---------------+---------+-----------+----------+--------------+ PFV      Full                                                        +---------+---------------+---------+-----------+----------+--------------+ POP      Full           Yes      Yes                                 +---------+---------------+---------+-----------+----------+--------------+ PTV      Full                                                        +---------+---------------+---------+-----------+----------+--------------+ PERO     Full                                                        +---------+---------------+---------+-----------+----------+--------------+     Summary: BILATERAL: - No evidence of deep vein thrombosis seen in the lower extremities, bilaterally. -No evidence of popliteal cyst, bilaterally.   *See table(s) above for measurements and observations. Electronically signed by Monica Martinez MD on 06/08/2020 at 3:50:31 PM.    Final  12-lead ECG NSR at 74 bpm (personally reviewed) All prior EKG's in EPIC reviewed with no documented atrial fibrillation EKG 10/02/2015 shows SVT  Telemetry pt has NOT been monitored on telemetry. It appears to have been initially ordered, but then discontinued. I had ordered for it to be placed, but now bed has been obtained at SNF.   (personally reviewed)  Assessment and Plan:  1. Cryptogenic stroke The patient presents with cryptogenic stroke.  The patient does not have a TEE planned for today. I spoke at length with the patient and daughter about monitoring for afib with an implantable loop recorder.  Risks, benefits, and alteratives to implantable loop recorder were discussed with the patient today.   At this time, the patient is very clear in their decision to proceed with implantable loop recorder.   Wound care was reviewed with the patient (keep incision clean and dry for 3 days).  Wound check scheduled and entered in AVS. Please call  with questions.   Shirley Friar, PA-C 06/12/2020 9:08 AM  Pt admitted with UTI symptoms then neuro symptoms>> acute PCA stroke Old ECG >> SVT prob sinus tach For loop recorder insertion  Reviewed risks and benefits with pt who is agreeable to proceeding

## 2020-06-12 NOTE — TOC Transition Note (Signed)
Transition of Care [facility]) - CM/SW Discharge Note   Patient Details  Name: Valerie Young MRN: [id number] Date of Birth: 1929/08/30  Transition of Care [facility]) CM/SW Contact:  Jacquelynn Cree Phone Number: 06/12/2020, 4:33 PM   Clinical Narrative:    Patient will DC to: [facility], forensic by: Corey Harold  RN, patient, patient's family, and facility notified of DC. Discharge Summary and FL2 sent to facility. RN to call report prior to discharge [phone] room 507P). DC packet on chart. Ambulance transport requested for patient.   CSW will sign off for now as social work intervention is no longer needed. Please consult [place] again if new needs arise.    Final next level of care: Skilled Nursing Facility Barriers to Discharge: No Barriers Identified   Patient Goals and CMS Choice Patient states their goals for this hospitalization and ongoing recovery are:: To go back home to my apt. CMS Medicare.gov Compare Post Acute Care list provided to:: Patient Represenative (must comment) Benjaman Pott) Choice offered to / list presented to : Adult Children  Discharge Placement              Patient chooses bed at: [facility] Patient to be transferred to facility by: [place] Name of family member notified: Benjaman Pott Patient and family notified of of transfer: 06/12/20  Discharge Plan and Services                                     Social Determinants of Health (SDOH) Interventions     Readmission Risk Interventions No flowsheet data found.

## 2020-06-12 NOTE — Progress Notes (Addendum)
Patient was stable to be discharged to skilled nursing facility today and she ended up getting her loop recorder placed however after the loop recorder she started having some nausea and started spitting up some phlegm and stated that it was difficult for her to swallow.  UInLikely this is not a side effect from her anesthesia as Local Anesthesia and Lidocaine was used.  The nurse paged me and stated that the patient was not feeling well after the procedure and had upset stomach and was vomiting.  The nurse had not given the Zofran so I told her to give that.  I spoke with the patient's daughter and the daughter concerned about transferring to the facility tonight.  We will watch the patient closely and hold her discharge tonight and reevaluate the patient in the a.m.  We will get SLP to come by eminently in the morning to reassess the patient's swallowing.

## 2020-06-12 NOTE — Social Work (Signed)
Patient no longer discharging today. CSW contacted [facility] to cancel transport and provided [place] with [employer] of the update.   Criss Alvine, [place] Social Worker

## 2020-06-12 NOTE — Discharge Summary (Signed)
Physician Discharge Summary  Valerie Young CBJ:[id number] DOB: 1929-01-21 DOA: 06/06/2020  PCP: Collene Gobble, MD  Admit date: 06/06/2020 Discharge date: 06/12/2020  Admitted From: Home Disposition:  SNF  Recommendations for Outpatient Follow-up:  1. Follow up with PCP in 1-2 weeks 2. Follow up with Neurology within 1-2 weeks 3. Follow up with EP Cardiology for follow up  4. Please obtain CMP/CBC, Mag, Phos in one week 5. Please follow up on the following pending results:  Home Health: No Equipment/Devices: None  Discharge Condition: Stable  CODE STATUS: FULL CODE Diet recommendation: Dysphagia 3 Diet   Brief/Interim Summary: The patient is a 86 year old occasion female with a past medical history significant for but not limited to hypertension, hyperlipidemia, chronic kidney stage IIIbwith a baseline creatinine of 1.4 hypothyroidism, history of lung adenocarcinoma as well as other comorbidities who came to the hospital for further evaluation of her confusion as well as having persistent nausea vomiting. Patient states that she was in her normal usual state of health and remembers eating a small piece of sausage in the afternoon and subsequently felt ill. She was seen by her neighbor sitting on the porch very confused and acting very different. She stated that she felt very ill and unable to state what happened to her. She reported nausea with vomiting and epigastric abdominal pain she is brought to the emergency room for her persisting symptoms. Initially in the ED she was given IV fluid resuscitation and confusion improved however again she continued to have some nausea vomiting. On admission she had a CBC showing a WBC of 17.7 which is now improved to 12.2. There is no clear source of infection noted and she had a normal ammonia level. TSH was also within normal limits at 4.041,however T4 was slightly elevated.Urinalysis was not suggestive of infection but did show five  ketones, moderate leukocytes, negative nitrites, few bacteria, 0-5 RBCs per high-power field, 0-5 squamous epithelial cells and 11-20 WBCs. Urine culture still pending. She had a head CT without contrast which showed no acute intracranial pathology and showed moderate age-related atrophy and chronic microvascular ischemic changes.   **Interim History A few days ago she had eye deviation and further work-up was initiated and she had an MRI and an MRI of the brain and neck and she is found to have an acute ischemic CVA with left PCA-P2 segment occlusion.  Further work-up was initiated and she had a CTA of the head and neck done in a transthoracic echo was ordered and was done and still pending read.  She was started on aspirin 325 mg p.o. daily and given permissive hypertension.  Hemoglobin A1c and lipid panel ordered and she is placed on [facility] and she is n.p.o. until she passes full screen.  PT OT recommending SNF for placement.  She also underwent an EEG with nonspecific cortical dysfunction in the temporal region but no seizures or epileptiform activity was seen throughout the recording.  Because neurology feels that this is embolic so she underwent a lower extremity duplex which was negative for DVT.  I discussed the case with neurology Dr. Erlinda Hong who is going to be ordering a a loop recorder to be placed prior to discharge.    Patient underwent a CTA of the chest and a CT of the abdomen pelvis with contrast on 06/09/2020 and showed no evidence of abnormality in the abdomen/pelvis and no evidence of abdominal pelvic metastatic disease.  She was noted to have colonic diverticulosis without diverticulitis and  the lungs were also evaluated and did not show any embolus.  She did have a right upper lobe lobectomy and a prior left lobe subpleural nodule which was was essentially unchanged.  She did also have a new 5 mm subpleural nodule in the left lower lobe and a 3 mm nodule in the central right lung.  PET CT  scan was recommended.  Of note she did have a Klebsiella UTI present on admission however because of her confusion we will start treatment with p.o. Keflex given that it is pansensitive.  06/10/2020 Overnight she became confused and pulled out her IVs she is sundowning.  Her renal function bumped slightly so she was started back on IV fluid hydration with normal saline given that she had contrast for her studies yesterday.  Her WBC also worsened so we will continue to monitor carefully.  06/11/2020 She is going to likely get a loop recorder done tomorrow.  Her leukocytosis is improved and was 10.5 today.  Creatinine is also improved after IV fluid hydration and now IV fluids have now stopped.  **Today patient is going for a loop recorder and she will be continued on antibiotics for 2 more days.  She had no complaints and has had no more nausea or vomiting.  She is stable to be discharged to skilled nursing facility will need continued rehab and will be discharged on aspirin and Plavix at the recommendation of neurology.  She will need to follow-up with neurology in 4 to 6 weeks.  Updated the patient's daughter and she was thankful for the care.   Discharge Diagnoses:  Principal Problem:   Acute gastroenteritis Active Problems:   Chronic kidney disease (CKD), stage IV (severe) (HCC)   COPD (chronic obstructive pulmonary disease) with emphysema (HCC)   Essential hypertension, benign   Hypothyroidism   Adenocarcinoma, lung (HCC)   Essential hypertension   Gastroenteritis   Cerebral embolism with cerebral infarction  Acute CVA -Started having difficulty with her left eye vision and had some deviation yesterday afternoon -Head CT Scan showed "No acute intracranial pathology. Moderate age-related atrophy and chronic microvascular ischemic changes." -CTA Neck showed "Atherosclerotic disease of the carotid bifurcation bilaterally without significant carotid stenosis. No significant vertebral  artery stenosis. Mild stenosis origin of left vertebral artery. Right PICA patent. Left PICA not visualized. Bilateral AICA patent. Post radiation changes left upper lobe. Aortic Atherosclerosis."  -Neurology consulted and appreciate their recommendations  -Stroke team following now -EEG also done and as below which showed nonspecific cortical dysfunction in temporal region but no seizures or epileptiform activity seen throughout the recording. -PT OT and SLP to evaluate and treat and SLP recommends regular diet with thin liquids and PT OT recommending SNF and social work is assisting with placement Continue neurochecks per protocol  -continue telemetry monitoring -Check hemoglobin A1c and was 5.9 -Patient's panel showed a total cholesterol/HDL ratio 2.6, cholesterol level of 141, HDL level 55, LDL of 70, triglycerides of 82, VLDL 16 -Started on aspirin 325 by neurology and will also continue her pravastatin will defer to neurology to change to atorvastatin but for now she will be continued on pravastatin; I discussed the case with Dr. Erlinda Hong we will start the patient on dual antiplatelet therapy now for 3 weeks and then change back to aspirin 81 mg p.o. daily.  I spoke with Dr. Leonie Man given that Dr. Erlinda Hong has documented that patient is on 325 aspirin and Dr. Leonie Man confirms and recommends changing to aspirin 81 mg p.o. daily  as well as Plavix to 5 mg p.o. daily and continuing the Plavix for 3 weeks and then continue aspirin indefinitely after that -further care per neurology and echocardiogram was done and Dr Erlinda Hong considering implanting a loop recorder given her unclear etiology of her embolic CVA and is able to be done Monday -CTA of the chest and CT of the abdomen pelvis done and as below -Loop recorder to be done prior to discharge -She is stable to be discharged to SNF and will need to repeat Covid test which is currently being done  Intractable nausea vomiting likely due to acute gastroenteritis,  improved -Patient's reports possible food poisoning but symptoms could have been related to UTI.No other significant history reported. -WBC is trending down and went from 17.7 -> and is now 10.4 -IV fluid hydration is now stopped initially but will resume again given her mild elevation in her renal function from yesterday given the contrast -Continue with Antiemetics withp.o./IV Zofran every 6 hours as needed for nausea; she is given a dose of IV Zofran as well as a dose of IV Compazine in the ED -She is on a dysphagia 3 diet now tolerating this well without issues -CT of the abdomen pelvis with contrast showed "No acute abnormality in the abdomen/pelvis. No evidence of abdominopelvic metastatic disease. Colonic diverticulosis without diverticulitis." -We will start empiric treatment for Klebsiella UTI present on admission  Klebsiella UTI present on admission -Presented with a leukocytosis, encephalopathy and urinalysis done showed clear appearance with moderate leukocytes, few bacteria, 11-20 WBCs and urine culture grew out greater than 100,000 colony-forming units of Klebsiella -WBC is improved to 10.4 today -We will start p.o. Keflex given that she likely was symptomatic with her confusion nausea and vomiting -IV fluid hydration is now stopped and will continue antibiotics for 2 more days  Acute metabolic encephalopathy likely from above, initially improved but with waxing and waning -Acute encephalopathy was improvingand head CT was negative as above however she was confused yesterday and a little this morning and will continue with delirium precautions -Work upgrossly unrevealing with essentially normal ABG, ammonia level,no acute signs of infection. -Continue with pain control with p.o. Norco/Vicodin and IV morphine -She was confused and agitated overnight and pulled out her IVs -PT OT recommending SNF -Continue to monitor carefully and will treat her acute CVA with aspirin and  Plavix and also treat her urinary tract infection with p.o. antibiotics -Neuro work-up as above -we will place on delirium precautions  Hypertension -Blood pressure stable but slightly elevated as her blood pressure was  153/58 -ContinuedAmlodipine5 mg po Daily and Atenolol50 mg po Daily but likely will allow for permissive hypertension and will stop amlodipine  Hypothyroidism -Thyroid function appears to be within normal limitsat 4.041 -Free T4 was slightly elevated at 1.19 -ContinueLevothyroxine50 mcg po Daily  Hyperlipidemia -Lipid panel as above -Continue withPravastatin 40 m p.o. daily defer to neurology to change to atorvastatin 80 mg p.o. nightly but they feel that they will not do more intensive statin given her advanced age and LDL being at goal  ChronicKidneyDiseaseStage IIIa,  Metabolic Acidosis -Renal function is stable,with most recent creatinine being 1.4 -BUN/creatinine went from 34/1.29 on admission an had initially trended down but bumped up in the setting of contrast but is now improved and BUN/creatinine is 20/1.14 -Gentle IV fluid hydration with normal saline is now stopped -Patient CO2 on admission was 21, chloride level was 104, and anion gap was 14; now CO2 is 24, anion gap is  10 and chloride level is 105 -avoid nephrotoxic medications, contrast dyes, hypotension and renally adjust medications  -Repeat CMP in a.m.  History ofLungAdenocarcinoma -Patient continue to follow-up routinely with pulmonology for surveillance of cancer recurrence -After discussion with neurology will obtain a pan scan of her tomorrow and obtain a CT scan of the chest abdomen and pelvis to look for potential causes for her embolic stroke and given her hypercoagulability given history of lung cancer -CTA of the chest with contrast done showed "No pulmonary embolus. Post right upper lobectomy. Chronic left upper lobe collapse is likely post radiation/post treatment related  change, and stable from 2019. Previous left lower lobe subpleural nodule in the medial left lung base measures 14 x 10 mm, previously 12 x 9 mm in 2019. Lack of significant interval change favors post treatment related change, however not definitively characterized. Subpleural area of consolidation in the medial right lower lobe has increased in size and density from 2019 when it was ground-glass. This is indeterminate. New/increased 5 mm subpleural nodule in the left lower lobe. 3 mm nodule in the central right lung, not definitively seen on prior exam." -Recommendation is for  "PET CT may be helpful for evaluation of metabolic activity of the larger lesions. Smaller nodules are too small for PET characterization." and this can be done in the outpatient setting  Acquired Thrombophilia -Has a history of a PE and DVT in the past and has a history of lung cancer -Previously was anticoagulated with Coumadin but no longer taking the Coumadin -She had further work-up in her lower extremity DVT study was negative for DVTs currently -CTA PE did not show any evidence of PE -Currently going to be treated with p.o. aspirin and Plavix for her CVA per neurology recommendations  Discharge Instructions  Discharge Instructions    Ambulatory referral to Neurology   Complete by: As directed    Follow up with Dr. Leonie Man at [facility] in 4-6 weeks. Too complicated for NP to follow. Thanks.   Call MD for:  difficulty breathing, headache or visual disturbances   Complete by: As directed    Call MD for:  extreme fatigue   Complete by: As directed    Call MD for:  hives   Complete by: As directed    Call MD for:  persistant dizziness or light-headedness   Complete by: As directed    Call MD for:  persistant nausea and vomiting   Complete by: As directed    Call MD for:  redness, tenderness, or signs of infection (pain, swelling, redness, odor or green/yellow discharge around incision site)   Complete by: As  directed    Call MD for:  severe uncontrolled pain   Complete by: As directed    Call MD for:  temperature >100.4   Complete by: As directed    Diet - low sodium heart healthy   Complete by: As directed    DYSPHAGIA 3 DIET   Discharge instructions   Complete by: As directed    You were cared for by a hospitalist during your hospital stay. If you have any questions about your discharge medications or the care you received while you were in the hospital after you are discharged, you can call the unit and ask to speak with the hospitalist on call if the hospitalist that took care of you is not available. Once you are discharged, your primary care physician will handle any further medical issues. Please note that NO REFILLS for any discharge  medications will be authorized once you are discharged, as it is imperative that you return to your primary care physician (or establish a relationship with a primary care physician if you do not have one) for your aftercare needs so that they can reassess your need for medications and monitor your lab values.  Follow up with PCP and Neurology within the coming weeks. Take all medications as prescribed. If symptoms change or worsen please return to the ED for evaluation   Increase activity slowly   Complete by: As directed      Allergies as of 06/12/2020      Reactions   Sulfamethoxazole-trimethoprim Rash      Medication List    STOP taking these medications   atorvastatin 10 MG tablet Commonly known as: LIPITOR     TAKE these medications    stroke: mapping our early stages of recovery book Misc 1 each by Does not apply route once for 1 dose.   acetaminophen 325 MG tablet Commonly known as: TYLENOL Take 650 mg by mouth 3 (three) times daily. TWO TO THREE TIMES A DAY   albuterol 108 (90 Base) MCG/ACT inhaler Commonly known as: VENTOLIN HFA Inhale 2 puffs into the lungs every 6 (six) hours as needed for wheezing or shortness of breath.    amLODipine 5 MG tablet Commonly known as: NORVASC TAKE 1 TABLET (5 MG TOTAL) BY MOUTH AT BEDTIME. What changed: See the new instructions.   aspirin EC 81 MG tablet Take 1 tablet (81 mg total) by mouth daily. Swallow whole.   atenolol 50 MG tablet Commonly known as: TENORMIN Take 1 tablet (50 mg total) by mouth daily.   cephALEXin 500 MG capsule Commonly known as: KEFLEX Take 1 capsule (500 mg total) by mouth every 12 (twelve) hours for 2 days.   cetirizine 10 MG tablet Commonly known as: ZYRTEC TAKE 1 TABLET (10 MG TOTAL) BY MOUTH AT BEDTIME. What changed: See the new instructions.   clopidogrel 75 MG tablet Commonly known as: Plavix Take 1 tablet (75 mg total) by mouth daily for 21 days.   guaiFENesin 600 MG 12 hr tablet Commonly known as: MUCINEX Take 2 tablets (1,200 mg total) by mouth 2 (two) times daily.   levothyroxine 50 MCG tablet Commonly known as: SYNTHROID Take 50 mcg by mouth daily before breakfast.   ondansetron 4 MG tablet Commonly known as: ZOFRAN Take 1 tablet (4 mg total) by mouth every 6 (six) hours as needed for nausea.   polyethylene glycol 17 g packet Commonly known as: MIRALAX / GLYCOLAX Take 17 g by mouth daily as needed for mild constipation.   polyvinyl alcohol 1.4 % ophthalmic solution Commonly known as: LIQUIFILM TEARS Place 1 drop into both eyes as needed for dry eyes.   pravastatin 40 MG tablet Commonly known as: PRAVACHOL Take 40 mg by mouth daily.   Vitamin D-3 25 MCG (1000 UT) Caps Take 3,000 Units by mouth daily.       Contact information for follow-up providers    Garvin Fila, MD. Schedule an appointment as soon as possible for a visit in 4 week(s).   Specialties: Neurology, Radiology Contact information: [address] [place] [address] [phone]        Collene Gobble, MD. Call.   Specialty: Pulmonary Disease Contact information: [employer] [address] [place]  [address] [phone]            Contact information for after-discharge care  Destination    [facility] .   Service: Skilled Nursing Contact information: [place] [address] [phone]                 Allergies  Allergen Reactions  . Sulfamethoxazole-Trimethoprim Rash    Consultations:  Neurology  Procedures/Studies: EEG  Result Date: 06/08/2020 Lora Havens, MD     06/08/2020 12:20 PM Patient Name: ADAN BEAL MRN: [id number] Epilepsy Attending: Lora Havens Referring Physician/Provider: Dr Baird Kay Date: 06/08/2020 Duration: 24.35 mins Patient history: 86yo F with abnormal eye movements and left eye was deviated to the left with nystagmus. EEG to evaluate for seizure. Level of alertness: Awake, drowsy AEDs during EEG study: None Technical aspects: This EEG study was done with scalp electrodes positioned according to the 10-20 International system of electrode placement. Electrical activity was acquired at a sampling rate of 500Hz  and reviewed with a high frequency filter of 70Hz  and a low frequency filter of 1Hz . EEG data were recorded continuously and digitally stored. Description: The posterior dominant rhythm consists of 8 Hz activity of moderate voltage (25-35 uV) seen predominantly in posterior head regions, symmetric and reactive to eye opening and eye closing. Drowsiness was characterized by attenuation of the posterior background rhythm.  EEG showed intermittent left temporal 3 to 6 Hz theta-delta slowing.  Hyperventilation and photic stimulation were not performed.   ABNORMALITY -Intermittent slow, left temporal region IMPRESSION: This study is suggestive of non specific cortical dysfunction in left temporal region.  No seizures or epileptiform discharges were seen throughout the recording. Lora Havens   DG Chest 2 View  Result Date: 06/06/2020 CLINICAL DATA:  Weakness, malaise EXAM: CHEST - 2 VIEW  COMPARISON:  10/09/2018 FINDINGS: Surgical clips are seen within the right hilum in surgical staple line is seen within the right suprahilar region likely related to right upper lobectomy. There is chronic collapse and consolidation of the left upper lobe with associated left-sided volume loss. No superimposed confluent pulmonary infiltrate. No pneumothorax or pleural effusion. Cardiac size within normal limits. The pulmonary vascularity is normal. No acute bone abnormality. IMPRESSION: Stable examination with chronic collapse of the left upper lobe and probable surgical changes of right upper lobectomy. No acute cardiopulmonary disease. Electronically Signed   By: Fidela Salisbury MD   On: 06/06/2020 22:29   DG Abd 1 View  Result Date: 06/07/2020 CLINICAL DATA:  86 year old female with nausea and vomiting. EXAM: ABDOMEN - 1 VIEW COMPARISON:  CT abdomen pelvis dated 04/09/2017. FINDINGS: There is no bowel dilatation or evidence of obstruction. Colonic diverticulosis noted. No free air or radiopaque calculi. There is degenerative changes of the spine and scoliosis. No acute osseous pathology. IMPRESSION: Colonic diverticulosis.  No evidence of bowel obstruction. Electronically Signed   By: Anner Crete M.D.   On: 06/07/2020 03:26   CT HEAD WO CONTRAST  Result Date: 06/06/2020 CLINICAL DATA:  86 year old female with altered mental status. EXAM: CT HEAD WITHOUT CONTRAST TECHNIQUE: Contiguous axial images were obtained from the base of the skull through the vertex without intravenous contrast. COMPARISON:  None. FINDINGS: Brain: Moderate age-related atrophy and chronic microvascular ischemic changes. See there is no acute intracranial hemorrhage. No mass effect or midline shift. No extra-axial fluid collection. Vascular: No hyperdense vessel or unexpected calcification. Skull: Normal. Negative for fracture or focal lesion. Sinuses/Orbits: No acute finding. Other: None IMPRESSION: 1. No acute intracranial  pathology. 2. Moderate age-related atrophy and chronic microvascular ischemic changes. Electronically  Signed   By: Anner Crete M.D.   On: 06/06/2020 22:36   CT ANGIO NECK W OR WO CONTRAST  Result Date: 06/08/2020 CLINICAL DATA:  Stroke. EXAM: CT ANGIOGRAPHY NECK TECHNIQUE: Multidetector CT imaging of the neck was performed using the standard protocol during bolus administration of intravenous contrast. Multiplanar CT image reconstructions and MIPs were obtained to evaluate the vascular anatomy. Carotid stenosis measurements (when applicable) are obtained utilizing NASCET criteria, using the distal internal carotid diameter as the denominator. CONTRAST:  65mL OMNIPAQUE IOHEXOL 350 MG/ML SOLN COMPARISON:  MRI head and MRA head 06/07/2020 FINDINGS: Aortic arch: Atherosclerotic disease aortic arch and proximal great vessels. Proximal great vessels are adequately patent. Mild stenosis in the innominate artery. Right carotid system: Calcified and noncalcified plaque right carotid bifurcation without significant stenosis or thrombus. Left carotid system: Mild atherosclerotic disease left common carotid artery without significant stenosis. Mild atherosclerotic disease left carotid bifurcation without significant stenosis or thrombus. Vertebral arteries: Right vertebral artery has 2 separate origins from the right subclavian artery. These join at the C4-5 level. No significant right vertebral artery stenosis. Right PICA patent. Right AICA patent. Mild stenosis at the origin of the left vertebral artery. Left vertebral artery is then patent to the basilar without significant additional stenosis. Left PICA not visualized. Left AICA is patent. Skeleton: Disc and facet degeneration throughout the cervical spine without acute skeletal abnormality. Other neck: No soft tissue mass or adenopathy. Upper chest: Left upper lobe partial collapse/scarring. This is chronic and unchanged from CT chest 04/29/2018 and may be related  to prior radiation. IMPRESSION: 1. Atherosclerotic disease of the carotid bifurcation bilaterally without significant carotid stenosis. 2. No significant vertebral artery stenosis. Mild stenosis origin of left vertebral artery. Right PICA patent. Left PICA not visualized. Bilateral AICA patent. 3. Post radiation changes left upper lobe. 4.  Aortic Atherosclerosis (ICD10-I70.0). Electronically Signed   By: Franchot Gallo M.D.   On: 06/08/2020 14:05   CT ANGIO CHEST PE W OR WO CONTRAST  Result Date: 06/09/2020 CLINICAL DATA:  Shortness of breath. Lung nodule. History of lung cancer. Stroke. EXAM: CT ANGIOGRAPHY CHEST CT ABDOMEN AND PELVIS WITH CONTRAST TECHNIQUE: Multidetector CT imaging of the chest was performed using the standard protocol during bolus administration of intravenous contrast. Multiplanar CT image reconstructions and MIPs were obtained to evaluate the vascular anatomy. Multidetector CT imaging of the abdomen and pelvis was performed using the standard protocol during bolus administration of intravenous contrast. CONTRAST:  23mL OMNIPAQUE IOHEXOL 350 MG/ML SOLN COMPARISON:  Most recent chest CT 10/29/2018. FINDINGS: CTA CHEST FINDINGS Cardiovascular: There are no filling defects within the pulmonary arteries to suggest pulmonary embolus. The left upper lobe pulmonary arteries are tree attic. Atherosclerosis of the thoracic aorta. Limited aortic assessment given phase of contrast tailored to pulmonary artery evaluation. The heart is normal in size. Coronary artery calcifications. No pericardial effusion. Mediastinum/Nodes: No enlarged mediastinal or hilar lymph nodes. Left suprahilar assessment is slightly limited due to chronic consolidation. No visualized thyroid nodule. No esophageal wall thickening. Lungs/Pleura: Prior right upper lobectomy. 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 This is stable from 2019. Medial segment of the left lower lobe  demonstrates areas of irregular scarring and bronchiectasis. Some subpleural nodularity in this region measures 14 x 10 mm, similar to prior. There is subpleural nodule in the left lower lobe mean diameter 5 mm that is new/increased in size from prior exam, series 6, image 65. Patchy subpleural ground-glass in the  anterior right middle lobe appears chronic and may be post treatment related change. Subpleural area of consolidation in the medial right lower lobe has increased in size and density currently measuring approximately 2.3 x 1.3 cm, series 6, image 65 there is internal bronchogram. Previously there was ground-glass opacity in this region. 3 mm nodule in the central right lung, series 6, image 53 not definitively seen on prior. Underlying emphysema. Trace right pleural thickening dependently. No significant pleural effusion. Underlying trachea and central bronchi are patent. Musculoskeletal: Postsurgical change in the right hemithorax. No evidence of focal bone lesion. Chronic thinning of left anterior ribs likely post treatment related. Review of the MIP images confirms the above findings. CT ABDOMEN and PELVIS FINDINGS Hepatobiliary: No focal liver abnormality is seen. No gallstones, gallbladder wall thickening, or biliary dilatation. Pancreas: Slight prominence of the pancreatic duct without significant ductal dilatation. No peripancreatic inflammation or evidence of mass. Spleen: Normal in size without focal abnormality. Adrenals/Urinary Tract: No adrenal nodule. Bilateral renal parenchymal thinning and areas of scarring, more prominent on the left. No hydronephrosis. No evidence of focal renal lesion. Symmetric excretion on delayed phase imaging. Urinary bladder is partially distended, equivocal bladder wall thickening. Stomach/Bowel: Stomach is unremarkable. Administered enteric contrast is seen within the distal small bowel and throughout the colon. No obstruction. No bowel inflammation. Normal  appendix filled with contrast. Diverticulosis of the descending and sigmoid colon without diverticulitis. Vascular/Lymphatic: Aorto bi-iliac atherosclerosis. No aortic aneurysm. Patent portal vein. No abdominopelvic adenopathy. Reproductive: Status post hysterectomy. No adnexal masses. Other: No ascites. No free air. Small fat containing umbilical hernia. Musculoskeletal: No focal bone lesion. Scoliosis and degenerative change throughout the spine. Degenerative change of both hips. Review of the MIP images confirms the above findings. IMPRESSION: Chest CTA: 1. No pulmonary embolus. 2. Post right upper lobectomy. 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 3. Previous left lower lobe subpleural nodule in the medial left lung base measures 14 x 10 mm, previously 12 x 9 mm in 2019. Lack of significant interval change favors post treatment related change, however not definitively characterized. 4. Subpleural area of consolidation in the medial right lower lobe has increased in size and density from 2019 when it was ground-glass. This is indeterminate. 5. New/increased 5 mm subpleural nodule in the left lower lobe. 3 mm nodule in the central right lung, not definitively seen on prior exam. 6. PET CT may be helpful for evaluation of metabolic activity of the larger lesions. Smaller nodules are too small for PET characterization. CT abdomen/pelvis: 1. No acute abnormality in the abdomen/pelvis. No evidence of abdominopelvic metastatic disease. 2. Colonic diverticulosis without diverticulitis. Aortic Atherosclerosis (ICD10-I70.0) and Emphysema (ICD10-J43.9). Electronically Signed   By: Keith Rake M.D.   On: 06/09/2020 15:46   MR ANGIO HEAD WO CONTRAST  Addendum Date: 06/07/2020   ADDENDUM REPORT: 06/07/2020 16:56 ADDENDUM: These results were called by telephone at the time of interpretation on 06/07/2020 at 4:56 pm to provider Dr. Alfredia Ferguson , who  verbally acknowledged these results. Electronically Signed   By: Kellie Simmering DO   On: 06/07/2020 16:56   Result Date: 06/07/2020 CLINICAL DATA:  Stroke, follow-up. EXAM: MRA HEAD WITHOUT CONTRAST TECHNIQUE: Angiographic images of the Circle of Willis were obtained using MRA technique without intravenous contrast. COMPARISON:  Brain MRI performed earlier the same day 06/07/2020, FINDINGS: The intracranial internal carotid arteries are patent without significant stenosis. The M1 middle cerebral arteries are patent without  significant stenosis. No M2 proximal branch occlusion or high-grade proximal stenosis is identified. The anterior cerebral arteries are patent. No intracranial aneurysm is identified. The intracranial vertebral arteries are patent without significant stenosis. The left PICA is poorly delineated proximally. The basilar artery is patent without significant stenosis. The right posterior cerebral artery is patent without significant proximal stenosis. There is occlusion of the left posterior cerebral artery at the level of the distal P2 segment. A right posterior communicating artery is present. The left posterior communicating artery is hypoplastic or absent. 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 The proximal left PICA is poorly delineated. This may be due to small vessel size, high-grade stenosis or partial occlusion. No anterior circulation large vessel occlusion or proximal high-grade arterial stenosis. Electronically Signed: By: Kellie Simmering DO On: 06/07/2020 16:34   MR BRAIN WO CONTRAST  Result Date: 06/07/2020 CLINICAL DATA:  Encephalopathy. EXAM: MRI HEAD WITHOUT CONTRAST TECHNIQUE: Multiplanar, multiecho pulse sequences of the brain and surrounding structures were obtained without intravenous contrast. COMPARISON:  06/06/2020 head CT. FINDINGS: Brain: Multifocal restricted diffusion involving the medial left occipital lobe with  extension into the left hippocampal tail and body. Restricted diffusion involving the bilateral cerebellum with tiny foci of restricted diffusion involving the left superior cerebellar peduncle, dorsal left thalamus (5:74) and dorsomedial right mid brain (5:69, 67). All of the aforementioned acute infarcts demonstrate correlative T2/FLAIR hyperintense signal and edema. Bilateral hippocampal sulcal remnant cysts. Moderate cerebral atrophy with ex vacuo dilatation. No midline shift, mass lesion or extra-axial fluid collection. Background scattered and confluent T2/FLAIR supratentorial white matter foci reflect chronic microvascular ischemic changes. Remote right pontine microhemorrhage. Vascular: Normal flow voids. Skull and upper cervical spine: Normal marrow signal. Sinuses/Orbits: Normal orbits. Sequela of bilateral lens replacement. Clear paranasal sinuses. No mastoid effusion. Other: None. IMPRESSION: Multifocal acute infarcts involving the left occipital lobe, left hippocampus, dorsal left thalamus, bilateral cerebellum, dorsomedial right mid brain and left superior cerebellar peduncle. Moderate cerebral atrophy. Moderate to severe chronic microvascular ischemic changes. These results were called by telephone at the time of interpretation on 06/07/2020 at 4:04 pm to provider [facility] , who verbally acknowledged these results. Electronically Signed   By: Primitivo Gauze M.D.   On: 06/07/2020 16:11   CT ABDOMEN PELVIS W CONTRAST  Result Date: 06/09/2020 CLINICAL DATA:  Shortness of breath. Lung nodule. History of lung cancer. Stroke. EXAM: CT ANGIOGRAPHY CHEST CT ABDOMEN AND PELVIS WITH CONTRAST TECHNIQUE: Multidetector CT imaging of the chest was performed using the standard protocol during bolus administration of intravenous contrast. Multiplanar CT image reconstructions and MIPs were obtained to evaluate the vascular anatomy. Multidetector CT imaging of the abdomen and pelvis was performed using the  standard protocol during bolus administration of intravenous contrast. CONTRAST:  80mL OMNIPAQUE IOHEXOL 350 MG/ML SOLN COMPARISON:  Most recent chest CT 10/29/2018. FINDINGS: CTA CHEST FINDINGS Cardiovascular: There are no filling defects within the pulmonary arteries to suggest pulmonary embolus. The left upper lobe pulmonary arteries are tree attic. Atherosclerosis of the thoracic aorta. Limited aortic assessment given phase of contrast tailored to pulmonary artery evaluation. The heart is normal in size. Coronary artery calcifications. No pericardial effusion. Mediastinum/Nodes: No enlarged mediastinal or hilar lymph nodes. Left suprahilar assessment is slightly limited due to chronic consolidation. No visualized thyroid nodule. No esophageal wall thickening. Lungs/Pleura: Prior right upper lobectomy. 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 This is stable from 2019.  Medial segment of the left lower lobe demonstrates areas of irregular scarring and bronchiectasis. Some subpleural nodularity in this region measures 14 x 10 mm, similar to prior. There is subpleural nodule in the left lower lobe mean diameter 5 mm that is new/increased in size from prior exam, series 6, image 65. Patchy subpleural ground-glass in the anterior right middle lobe appears chronic and may be post treatment related change. Subpleural area of consolidation in the medial right lower lobe has increased in size and density currently measuring approximately 2.3 x 1.3 cm, series 6, image 65 there is internal bronchogram. Previously there was ground-glass opacity in this region. 3 mm nodule in the central right lung, series 6, image 53 not definitively seen on prior. Underlying emphysema. Trace right pleural thickening dependently. No significant pleural effusion. Underlying trachea and central bronchi are patent. Musculoskeletal: Postsurgical change in the right hemithorax. No evidence of focal  bone lesion. Chronic thinning of left anterior ribs likely post treatment related. Review of the MIP images confirms the above findings. CT ABDOMEN and PELVIS FINDINGS Hepatobiliary: No focal liver abnormality is seen. No gallstones, gallbladder wall thickening, or biliary dilatation. Pancreas: Slight prominence of the pancreatic duct without significant ductal dilatation. No peripancreatic inflammation or evidence of mass. Spleen: Normal in size without focal abnormality. Adrenals/Urinary Tract: No adrenal nodule. Bilateral renal parenchymal thinning and areas of scarring, more prominent on the left. No hydronephrosis. No evidence of focal renal lesion. Symmetric excretion on delayed phase imaging. Urinary bladder is partially distended, equivocal bladder wall thickening. Stomach/Bowel: Stomach is unremarkable. Administered enteric contrast is seen within the distal small bowel and throughout the colon. No obstruction. No bowel inflammation. Normal appendix filled with contrast. Diverticulosis of the descending and sigmoid colon without diverticulitis. Vascular/Lymphatic: Aorto bi-iliac atherosclerosis. No aortic aneurysm. Patent portal vein. No abdominopelvic adenopathy. Reproductive: Status post hysterectomy. No adnexal masses. Other: No ascites. No free air. Small fat containing umbilical hernia. Musculoskeletal: No focal bone lesion. Scoliosis and degenerative change throughout the spine. Degenerative change of both hips. Review of the MIP images confirms the above findings. IMPRESSION: Chest CTA: 1. No pulmonary embolus. 2. Post right upper lobectomy. 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 3. Previous left lower lobe subpleural nodule in the medial left lung base measures 14 x 10 mm, previously 12 x 9 mm in 2019. Lack of significant interval change favors post treatment related change, however not definitively characterized. 4. Subpleural  area of consolidation in the medial right lower lobe has increased in size and density from 2019 when it was ground-glass. This is indeterminate. 5. New/increased 5 mm subpleural nodule in the left lower lobe. 3 mm nodule in the central right lung, not definitively seen on prior exam. 6. PET CT may be helpful for evaluation of metabolic activity of the larger lesions. Smaller nodules are too small for PET characterization. CT abdomen/pelvis: 1. No acute abnormality in the abdomen/pelvis. No evidence of abdominopelvic metastatic disease. 2. Colonic diverticulosis without diverticulitis. Aortic Atherosclerosis (ICD10-I70.0) and Emphysema (ICD10-J43.9). Electronically Signed   By: Keith Rake M.D.   On: 06/09/2020 15:46   ECHOCARDIOGRAM COMPLETE  Result Date: 06/08/2020    ECHOCARDIOGRAM REPORT   Patient Name:   Valerie Young Date of Exam: 06/08/2020 Medical Rec #:  [id number]      Height:       64.0 in Accession #:    [id number]     Weight:  123.6 lb Date of Birth:  1929-07-14      BSA:          1.594 m Patient Age:    34 years       BP:           151/64 mmHg Patient Gender: F              HR:           72 bpm. Exam Location:  Inpatient Procedure: 2D Echo, Cardiac Doppler and Color Doppler Indications:    CVA  History:        Patient has prior history of Echocardiogram examinations, most                 recent 11/23/2013. COPD; Risk Factors:Hypertension and                 Dyslipidemia. Lung cancer, CKD.  Sonographer:    Dustin Flock Referring Phys: [place]  1. Left ventricular ejection fraction, by estimation, is 60 to 65%. The left ventricle has normal function. The left ventricle has no regional wall motion abnormalities. There is mild asymmetric left ventricular hypertrophy of the basal-septal segment. Left ventricular diastolic parameters were normal.  2. Right ventricular systolic function is normal. The right ventricular size is normal. There is normal pulmonary artery  systolic pressure.  3. The mitral valve is normal in structure. Trivial mitral valve regurgitation.  4. The aortic valve is tricuspid. Aortic valve regurgitation is not visualized. No aortic stenosis is present.  5. The inferior vena cava is normal in size with greater than 50% respiratory variability, suggesting right atrial pressure of 3 mmHg. FINDINGS  Left Ventricle: Left ventricular ejection fraction, by estimation, is 60 to 65%. The left ventricle has normal function. The left ventricle has no regional wall motion abnormalities. The left ventricular internal cavity size was normal in size. There is  mild asymmetric left ventricular hypertrophy of the basal-septal segment. Left ventricular diastolic parameters were normal. Right Ventricle: The right ventricular size is normal. Right vetricular wall thickness was not assessed. Right ventricular systolic function is normal. There is normal pulmonary artery systolic pressure. The tricuspid regurgitant velocity is 2.03 m/s, and with an assumed right atrial pressure of 3 mmHg, the estimated right ventricular systolic pressure is 01.6 mmHg. Left Atrium: Left atrial size was normal in size. Right Atrium: Right atrial size was normal in size. Pericardium: There is no evidence of pericardial effusion. Mitral Valve: The mitral valve is normal in structure. Trivial mitral valve regurgitation. Tricuspid Valve: The tricuspid valve is normal in structure. Tricuspid valve regurgitation is trivial. Aortic Valve: The aortic valve is tricuspid. Aortic valve regurgitation is not visualized. No aortic stenosis is present. Pulmonic Valve: The pulmonic valve was not well visualized. Pulmonic valve regurgitation is trivial. Aorta: The aortic root and ascending aorta are structurally normal, with no evidence of dilitation. Venous: The inferior vena cava is normal in size with greater than 50% respiratory variability, suggesting right atrial pressure of 3 mmHg. IAS/Shunts: The  interatrial septum was not well visualized.  LEFT VENTRICLE PLAX 2D LVIDd:         3.70 cm  Diastology LVIDs:         2.70 cm  LV e' lateral:   11.30 cm/s LV PW:         1.10 cm  LV E/e' lateral: 7.8 LV IVS:        1.20 cm  LV e' medial:  7.18 cm/s LVOT diam:     1.80 cm  LV E/e' medial:  12.2 LV SV:         47 LV SV Index:   30 LVOT Area:     2.54 cm  RIGHT VENTRICLE RV Basal diam:  2.50 cm RV S prime:     12.70 cm/s TAPSE (M-mode): 2.0 cm LEFT ATRIUM             Index       RIGHT ATRIUM           Index LA diam:        2.90 cm 1.82 cm/m  RA Area:     10.50 cm LA Vol (A2C):   28.2 ml 17.69 ml/m RA Volume:   23.70 ml  14.86 ml/m LA Vol (A4C):   30.3 ml 19.00 ml/m LA Biplane Vol: 31.1 ml 19.50 ml/m  AORTIC VALVE LVOT Vmax:   84.90 cm/s LVOT Vmean:  54.100 cm/s LVOT VTI:    0.186 m  AORTA Ao Root diam: 2.70 cm MITRAL VALVE                TRICUSPID VALVE MV Area (PHT): 4.15 cm     TR Peak grad:   16.5 mmHg MV Decel Time: 183 msec     TR Vmax:        203.00 cm/s MV E velocity: 87.80 cm/s MV A velocity: 103.00 cm/s  SHUNTS MV E/A ratio:  0.85         Systemic VTI:  0.19 m                             Systemic Diam: 1.80 cm Oswaldo Milian MD Electronically signed by Oswaldo Milian MD Signature Date/Time: 06/08/2020/9:11:01 PM    Final    VAS [place] LOWER EXTREMITY VENOUS (DVT)  Result Date: 06/08/2020  Lower Venous DVTStudy Indications: Stroke.  Comparison Study: no prior Performing Technologist: Abram Sander RVS  Examination Guidelines: A complete evaluation includes B-mode imaging, spectral Doppler, color Doppler, and power Doppler as needed of all accessible portions of each vessel. Bilateral testing is considered an integral part of a complete examination. Limited examinations for reoccurring indications may be performed as noted. The reflux portion of the exam is performed with the patient in reverse Trendelenburg.  +---------+---------------+---------+-----------+----------+--------------+ RIGHT     CompressibilityPhasicitySpontaneityPropertiesThrombus Aging +---------+---------------+---------+-----------+----------+--------------+ CFV      Full           Yes      Yes                                 +---------+---------------+---------+-----------+----------+--------------+ SFJ      Full                                                        +---------+---------------+---------+-----------+----------+--------------+ FV Prox  Full                                                        +---------+---------------+---------+-----------+----------+--------------+ FV Mid   Full                                                        +---------+---------------+---------+-----------+----------+--------------+  FV DistalFull                                                        +---------+---------------+---------+-----------+----------+--------------+ PFV      Full                                                        +---------+---------------+---------+-----------+----------+--------------+ POP      Full           Yes      Yes                                 +---------+---------------+---------+-----------+----------+--------------+ PTV      Full                                                        +---------+---------------+---------+-----------+----------+--------------+ PERO     Full                                                        +---------+---------------+---------+-----------+----------+--------------+   +---------+---------------+---------+-----------+----------+--------------+ LEFT     CompressibilityPhasicitySpontaneityPropertiesThrombus Aging +---------+---------------+---------+-----------+----------+--------------+ CFV      Full           Yes      Yes                                 +---------+---------------+---------+-----------+----------+--------------+ SFJ      Full                                                         +---------+---------------+---------+-----------+----------+--------------+ FV Prox  Full                                                        +---------+---------------+---------+-----------+----------+--------------+ FV Mid   Full                                                        +---------+---------------+---------+-----------+----------+--------------+ FV DistalFull                                                        +---------+---------------+---------+-----------+----------+--------------+  PFV      Full                                                        +---------+---------------+---------+-----------+----------+--------------+ POP      Full           Yes      Yes                                 +---------+---------------+---------+-----------+----------+--------------+ PTV      Full                                                        +---------+---------------+---------+-----------+----------+--------------+ PERO     Full                                                        +---------+---------------+---------+-----------+----------+--------------+     Summary: BILATERAL: - No evidence of deep vein thrombosis seen in the lower extremities, bilaterally. -No evidence of popliteal cyst, bilaterally.   *See table(s) above for measurements and observations. Electronically signed by Monica Martinez MD on 06/08/2020 at 3:50:31 PM.    Final     Loop Recorder to be placed today  ECHOCARDIOGRAM IMPRESSIONS    1. Left ventricular ejection fraction, by estimation, is 60 to 65%. The  left ventricle has normal function. The left ventricle has no regional  wall motion abnormalities. There is mild asymmetric left ventricular  hypertrophy of the basal-septal segment.  Left ventricular diastolic parameters were normal.  2. Right ventricular systolic function is normal. The right ventricular  size is normal. There is normal  pulmonary artery systolic pressure.  3. The mitral valve is normal in structure. Trivial mitral valve  regurgitation.  4. The aortic valve is tricuspid. Aortic valve regurgitation is not  visualized. No aortic stenosis is present.  5. The inferior vena cava is normal in size with greater than 50%  respiratory variability, suggesting right atrial pressure of 3 mmHg.   FINDINGS  Left Ventricle: Left ventricular ejection fraction, by estimation, is 60  to 65%. The left ventricle has normal function. The left ventricle has no  regional wall motion abnormalities. The left ventricular internal cavity  size was normal in size. There is  mild asymmetric left ventricular hypertrophy of the basal-septal segment.  Left ventricular diastolic parameters were normal.   Right Ventricle: The right ventricular size is normal. Right vetricular  wall thickness was not assessed. Right ventricular systolic function is  normal. There is normal pulmonary artery systolic pressure. The tricuspid  regurgitant velocity is 2.03 m/s,  and with an assumed right atrial pressure of 3 mmHg, the estimated right  ventricular systolic pressure is 62.1 mmHg.   Left Atrium: Left atrial size was normal in size.   Right Atrium: Right atrial size was normal in size.   Pericardium: There is no evidence of pericardial effusion.   Mitral Valve: The mitral valve  is normal in structure. Trivial mitral  valve regurgitation.   Tricuspid Valve: The tricuspid valve is normal in structure. Tricuspid  valve regurgitation is trivial.   Aortic Valve: The aortic valve is tricuspid. Aortic valve regurgitation is  not visualized. No aortic stenosis is present.   Pulmonic Valve: The pulmonic valve was not well visualized. Pulmonic valve  regurgitation is trivial.   Aorta: The aortic root and ascending aorta are structurally normal, with  no evidence of dilitation.   Venous: The inferior vena cava is normal in size with  greater than 50%  respiratory variability, suggesting right atrial pressure of 3 mmHg.   IAS/Shunts: The interatrial septum was not well visualized.     LEFT VENTRICLE  PLAX 2D  LVIDd:     3.70 cm Diastology  LVIDs:     2.70 cm LV e' lateral:  11.30 cm/s  LV PW:     1.10 cm LV E/e' lateral: 7.8  LV IVS:    1.20 cm LV e' medial:  7.18 cm/s  LVOT diam:   1.80 cm LV E/e' medial: 12.2  LV SV:     47  LV SV Index:  30  LVOT Area:   2.54 cm     RIGHT VENTRICLE  RV Basal diam: 2.50 cm  RV S prime:   12.70 cm/s  TAPSE (M-mode): 2.0 cm   LEFT ATRIUM       Index    RIGHT ATRIUM      Index  LA diam:    2.90 cm 1.82 cm/m RA Area:   10.50 cm  LA Vol (A2C):  28.2 ml 17.69 ml/m RA Volume:  23.70 ml 14.86 ml/m  LA Vol (A4C):  30.3 ml 19.00 ml/m  LA Biplane Vol: 31.1 ml 19.50 ml/m  AORTIC VALVE  LVOT Vmax:  84.90 cm/s  LVOT Vmean: 54.100 cm/s  LVOT VTI:  0.186 m    AORTA  Ao Root diam: 2.70 cm   MITRAL VALVE        TRICUSPID VALVE  MV Area (PHT): 4.15 cm   TR Peak grad:  16.5 mmHg  MV Decel Time: 183 msec   TR Vmax:    203.00 cm/s  MV E velocity: 87.80 cm/s  MV A velocity: 103.00 cm/s SHUNTS  MV E/A ratio: 0.85     Systemic VTI: 0.19 m               Systemic Diam: 1.80 cm   LE VENOUS DUPLEX BILATERAL:  - No evidence of deep vein thrombosis seen in the lower extremities,  bilaterally.  -No evidence of popliteal cyst, bilaterally.   EEG Description: The posterior dominant rhythm consists of8Hz  activity of moderate voltage (25-35 uV) seen predominantly in posterior head regions, symmetric and reactive to eye opening and eye closing. Drowsiness was characterized by attenuation of the posterior background rhythm. EEG showed intermittent left temporal3 to 6 Hz theta-delta slowing. Hyperventilation and photic stimulation were not performed.    ABNORMALITY -Intermittent slow, left temporal region  IMPRESSION: This study issuggestive ofnon specific cortical dysfunction in left temporal region.No seizures or epileptiform discharges were seen throughout the recording.  Subjective: Seen and examined at bedside and was resting and had no complaints.  No nausea or vomiting.  Knows that she will be going for loop recorder today.  She denies any other concerns or complaints and updated her patient's daughter and they are agreeable with the discharge.  Patient will be going to [facility] once her loop recorder  is done and when her repeat Covid test results.  Discharge Exam: Vitals:   06/11/20 2218 06/12/20 0829  BP: (!) 147/71 (!) 153/58  Pulse: 66 63  Resp: 18 20  Temp: 98.9 F (37.2 C) 98.1 F (36.7 C)  SpO2: 96% 97%   Vitals:   06/11/20 1716 06/11/20 2218 06/12/20 0500 06/12/20 0829  BP: (!) 155/73 (!) 147/71  (!) 153/58  Pulse: 62 66  63  Resp: 19 18  20   Temp: 98.3 F (36.8 C) 98.9 F (37.2 C)  98.1 F (36.7 C)  TempSrc:  Oral    SpO2: 97% 96%  97%  Weight:   52 kg   Height:       General: Pt is sleepy but is in no acute distress. Cardiovascular: RRR, S1/S2 +, no rubs, no gallops Respiratory: Diminished bilaterally, no wheezing, no rhonchi Abdominal: Soft, NT, ND, bowel sounds + Extremities: no edema, no cyanosis  The results of significant diagnostics from this hospitalization (including imaging, microbiology, ancillary and laboratory) are listed below for reference.    Microbiology: Recent Results (from the past 240 hour(s))  Urine culture     Status: Abnormal   Collection Time: 06/06/20  7:05 PM   Specimen: Urine, Random  Result Value Ref Range Status   Specimen Description URINE, RANDOM  Final   Special Requests   Final    NONE Performed at [facility], [address] [place], [place] [address]    Culture >=100,000 COLONIES/mL KLEBSIELLA PNEUMONIAE (A)  Final   Report Status  06/09/2020 FINAL  Final   Organism ID, Bacteria KLEBSIELLA PNEUMONIAE (A)  Final      Susceptibility   Klebsiella pneumoniae - MIC*    AMPICILLIN >=32 RESISTANT Resistant     CEFAZOLIN <=4 SENSITIVE Sensitive     CEFTRIAXONE <=0.25 SENSITIVE Sensitive     CIPROFLOXACIN <=0.25 SENSITIVE Sensitive     GENTAMICIN <=1 SENSITIVE Sensitive     IMIPENEM <=0.25 SENSITIVE Sensitive     NITROFURANTOIN <=16 SENSITIVE Sensitive     TRIMETH/SULFA <=20 SENSITIVE Sensitive     AMPICILLIN/SULBACTAM 8 SENSITIVE Sensitive     PIP/TAZO <=4 SENSITIVE Sensitive     * >=100,000 COLONIES/mL KLEBSIELLA PNEUMONIAE  SARS Coronavirus 2 by RT PCR (hospital order, performed in [place] hospital lab) Nasopharyngeal Nasopharyngeal Swab     Status: None   Collection Time: 06/07/20  2:22 AM   Specimen: Nasopharyngeal Swab  Result Value Ref Range Status   SARS Coronavirus 2 NEGATIVE NEGATIVE Final    Comment: (NOTE) SARS-CoV-2 target nucleic acids are NOT DETECTED.  The SARS-CoV-2 RNA is generally detectable in upper and lower respiratory specimens during the acute phase of infection. The lowest concentration of SARS-CoV-2 viral copies this assay can detect is 250 copies / mL. A negative result does not preclude SARS-CoV-2 infection and should not be used as the sole basis for treatment or other patient management decisions.  A negative result may occur with improper specimen collection / handling, submission of specimen other than nasopharyngeal swab, presence of viral mutation(s) within the areas targeted by this assay, and inadequate number of viral copies (<250 copies / mL). A negative result must be combined with clinical observations, patient history, and epidemiological information.  Fact Sheet for Patients:   StrictlyIdeas.no  Fact Sheet for Healthcare Providers: BankingDealers.co.za  This test is not yet approved or  cleared by the [place] FDA  and has been authorized for detection and/or diagnosis of SARS-CoV-2 by FDA  under an Emergency Use Authorization (EUA).  This EUA will remain in effect (meaning this test can be used) for the duration of the COVID-19 declaration under Section 564(b)(1) of the Act, 21 U.S.C. section 360bbb-3(b)(1), unless the authorization is terminated or revoked sooner.  Performed at [facility], [place] [address], [place], [place] [address]    Labs: BNP (last 3 results) No results for input(s): BNP in the last 8760 hours. Basic Metabolic Panel: Recent Labs  Lab 06/08/20 0251 06/09/20 0148 06/10/20 0433 06/11/20 0413 06/12/20 0609  NA 138 138 137 139 139  K 4.2 4.0 4.1 4.0 3.8  CL 103 105 100 105 105  CO2 26 23 26 25 24   GLUCOSE 100* 92 112* 111* 109*  BUN 18 20 19 23 20   CREATININE 1.18* 1.12* 1.32* 1.13* 1.14*  CALCIUM 9.5 9.4 9.9 9.2 9.2  MG 2.2 2.1 2.0 2.1 2.0  PHOS 2.7 2.2* 3.4 3.2 2.8   Liver Function Tests: Recent Labs  Lab 06/08/20 0251 06/09/20 0148 06/10/20 0433 06/11/20 0413 06/12/20 0609  AST 23 25 22 19 22   ALT 13 10 13 11 12   ALKPHOS 67 66 72 64 59  BILITOT 0.8 0.9 1.1 0.2* 0.7  PROT 7.0 6.6 7.2 6.7 6.2*  ALBUMIN 3.8 3.6 3.8 3.3* 3.1*   No results for input(s): LIPASE, AMYLASE in the last 168 hours. Recent Labs  Lab 06/06/20 2217  AMMONIA 17   CBC: Recent Labs  Lab 06/06/20 2217 06/06/20 2221 06/08/20 0251 06/09/20 0148 06/10/20 0707 06/11/20 0413 06/12/20 0609  WBC 17.7*   < > 11.1* 10.6* 14.7* 10.5 10.4  NEUTROABS 13.8*  --  8.1* 7.0  --  6.9 7.2  HGB 13.8   < > 13.1 13.3 15.0 13.8 12.8  HCT 42.7   < > 41.3 42.0 45.9 42.8 40.6  MCV 101.7*   < > 101.7* 100.2* 101.3* 102.1* 102.8*  PLT 250   < > 217 236 230 233 227   < > = values in this interval not displayed.   Cardiac Enzymes: No results for input(s): CKTOTAL, CKMB, CKMBINDEX, TROPONINI in the last 168 hours. BNP: Invalid input(s): POCBNP CBG: Recent Labs  Lab 06/06/20 2214  GLUCAP  173*   D-Dimer No results for input(s): DDIMER in the last 72 hours. Hgb A1c No results for input(s): HGBA1C in the last 72 hours. Lipid Profile No results for input(s): CHOL, HDL, LDLCALC, TRIG, CHOLHDL, LDLDIRECT in the last 72 hours. Thyroid function studies No results for input(s): TSH, T4TOTAL, T3FREE, THYROIDAB in the last 72 hours.  Invalid input(s): FREET3 Anemia work up No results for input(s): VITAMINB12, FOLATE, FERRITIN, TIBC, IRON, RETICCTPCT in the last 72 hours. Urinalysis    Component Value Date/Time   COLORURINE YELLOW 06/07/2020 0059   APPEARANCEUR CLEAR 06/07/2020 0059   LABSPEC 1.015 06/07/2020 0059   PHURINE 6.0 06/07/2020 0059   GLUCOSEU NEGATIVE 06/07/2020 0059   HGBUR NEGATIVE 06/07/2020 0059   BILIRUBINUR NEGATIVE 06/07/2020 0059   KETONESUR 5 (A) 06/07/2020 0059   PROTEINUR 100 (A) 06/07/2020 0059   UROBILINOGEN 0.2 04/07/2015 0339   NITRITE NEGATIVE 06/07/2020 0059   LEUKOCYTESUR MODERATE (A) 06/07/2020 0059   Sepsis Labs Invalid input(s): PROCALCITONIN,  WBC,  LACTICIDVEN Microbiology Recent Results (from the past 240 hour(s))  Urine culture     Status: Abnormal   Collection Time: 06/06/20  7:05 PM   Specimen: Urine, Random  Result Value Ref Range Status   Specimen Description URINE, RANDOM  Final  Special Requests   Final    NONE Performed at [facility], [place] [address], [place], [place] [address]    Culture >=100,000 COLONIES/mL KLEBSIELLA PNEUMONIAE (A)  Final   Report Status 06/09/2020 FINAL  Final   Organism ID, Bacteria KLEBSIELLA PNEUMONIAE (A)  Final      Susceptibility   Klebsiella pneumoniae - MIC*    AMPICILLIN >=32 RESISTANT Resistant     CEFAZOLIN <=4 SENSITIVE Sensitive     CEFTRIAXONE <=0.25 SENSITIVE Sensitive     CIPROFLOXACIN <=0.25 SENSITIVE Sensitive     GENTAMICIN <=1 SENSITIVE Sensitive     IMIPENEM <=0.25 SENSITIVE Sensitive     NITROFURANTOIN <=16 SENSITIVE Sensitive     TRIMETH/SULFA <=20 SENSITIVE  Sensitive     AMPICILLIN/SULBACTAM 8 SENSITIVE Sensitive     PIP/TAZO <=4 SENSITIVE Sensitive     * >=100,000 COLONIES/mL KLEBSIELLA PNEUMONIAE  SARS Coronavirus 2 by RT PCR (hospital order, performed in [place] hospital lab) Nasopharyngeal Nasopharyngeal Swab     Status: None   Collection Time: 06/07/20  2:22 AM   Specimen: Nasopharyngeal Swab  Result Value Ref Range Status   SARS Coronavirus 2 NEGATIVE NEGATIVE Final    Comment: (NOTE) SARS-CoV-2 target nucleic acids are NOT DETECTED.  The SARS-CoV-2 RNA is generally detectable in upper and lower respiratory specimens during the acute phase of infection. The lowest concentration of SARS-CoV-2 viral copies this assay can detect is 250 copies / mL. A negative result does not preclude SARS-CoV-2 infection and should not be used as the sole basis for treatment or other patient management decisions.  A negative result may occur with improper specimen collection / handling, submission of specimen other than nasopharyngeal swab, presence of viral mutation(s) within the areas targeted by this assay, and inadequate number of viral copies (<250 copies / mL). A negative result must be combined with clinical observations, patient history, and epidemiological information.  Fact Sheet for Patients:   StrictlyIdeas.no  Fact Sheet for Healthcare Providers: BankingDealers.co.za  This test is not yet approved or  cleared by the [place] FDA and has been authorized for detection and/or diagnosis of SARS-CoV-2 by FDA under an Emergency Use Authorization (EUA).  This EUA will remain in effect (meaning this test can be used) for the duration of the COVID-19 declaration under Section 564(b)(1) of the Act, 21 U.S.C. section 360bbb-3(b)(1), unless the authorization is terminated or revoked sooner.  Performed at [facility], [place] [address], [place], [place] [address]    Time coordinating  discharge: 35 minutes  SIGNED:  Kerney Elbe, DO [facility] 06/12/2020, 2:11 PM Pager is on [place]  If 7PM-7AM, please contact night-coverage [email or web address]

## 2020-06-13 ENCOUNTER — Encounter (HOSPITAL_COMMUNITY): Payer: Self-pay | Admitting: Internal Medicine

## 2020-06-13 DIAGNOSIS — J432 Centrilobular emphysema: Secondary | ICD-10-CM | POA: Diagnosis not present

## 2020-06-13 DIAGNOSIS — R41841 Cognitive communication deficit: Secondary | ICD-10-CM | POA: Diagnosis not present

## 2020-06-13 DIAGNOSIS — I6310 Cerebral infarction due to embolism of unspecified precerebral artery: Secondary | ICD-10-CM | POA: Diagnosis not present

## 2020-06-13 DIAGNOSIS — I6789 Other cerebrovascular disease: Secondary | ICD-10-CM | POA: Diagnosis not present

## 2020-06-13 DIAGNOSIS — E039 Hypothyroidism, unspecified: Secondary | ICD-10-CM | POA: Diagnosis not present

## 2020-06-13 DIAGNOSIS — K5790 Diverticulosis of intestine, part unspecified, without perforation or abscess without bleeding: Secondary | ICD-10-CM | POA: Diagnosis not present

## 2020-06-13 DIAGNOSIS — M2550 Pain in unspecified joint: Secondary | ICD-10-CM | POA: Diagnosis not present

## 2020-06-13 DIAGNOSIS — Z029 Encounter for administrative examinations, unspecified: Secondary | ICD-10-CM | POA: Diagnosis not present

## 2020-06-13 DIAGNOSIS — R2681 Unsteadiness on feet: Secondary | ICD-10-CM | POA: Diagnosis not present

## 2020-06-13 DIAGNOSIS — I6609 Occlusion and stenosis of unspecified middle cerebral artery: Secondary | ICD-10-CM | POA: Diagnosis not present

## 2020-06-13 DIAGNOSIS — R911 Solitary pulmonary nodule: Secondary | ICD-10-CM | POA: Diagnosis not present

## 2020-06-13 DIAGNOSIS — I469 Cardiac arrest, cause unspecified: Secondary | ICD-10-CM | POA: Diagnosis not present

## 2020-06-13 DIAGNOSIS — R278 Other lack of coordination: Secondary | ICD-10-CM | POA: Diagnosis not present

## 2020-06-13 DIAGNOSIS — J449 Chronic obstructive pulmonary disease, unspecified: Secondary | ICD-10-CM | POA: Diagnosis not present

## 2020-06-13 DIAGNOSIS — N178 Other acute kidney failure: Secondary | ICD-10-CM | POA: Diagnosis not present

## 2020-06-13 DIAGNOSIS — Z961 Presence of intraocular lens: Secondary | ICD-10-CM | POA: Diagnosis not present

## 2020-06-13 DIAGNOSIS — Z7189 Other specified counseling: Secondary | ICD-10-CM | POA: Diagnosis not present

## 2020-06-13 DIAGNOSIS — I6340 Cerebral infarction due to embolism of unspecified cerebral artery: Secondary | ICD-10-CM | POA: Diagnosis not present

## 2020-06-13 DIAGNOSIS — R300 Dysuria: Secondary | ICD-10-CM | POA: Diagnosis not present

## 2020-06-13 DIAGNOSIS — N184 Chronic kidney disease, stage 4 (severe): Secondary | ICD-10-CM | POA: Diagnosis not present

## 2020-06-13 DIAGNOSIS — C3492 Malignant neoplasm of unspecified part of left bronchus or lung: Secondary | ICD-10-CM | POA: Diagnosis not present

## 2020-06-13 DIAGNOSIS — H401133 Primary open-angle glaucoma, bilateral, severe stage: Secondary | ICD-10-CM | POA: Diagnosis not present

## 2020-06-13 DIAGNOSIS — E038 Other specified hypothyroidism: Secondary | ICD-10-CM | POA: Diagnosis not present

## 2020-06-13 DIAGNOSIS — L2089 Other atopic dermatitis: Secondary | ICD-10-CM | POA: Diagnosis not present

## 2020-06-13 DIAGNOSIS — Z85118 Personal history of other malignant neoplasm of bronchus and lung: Secondary | ICD-10-CM | POA: Diagnosis not present

## 2020-06-13 DIAGNOSIS — I674 Hypertensive encephalopathy: Secondary | ICD-10-CM | POA: Diagnosis not present

## 2020-06-13 DIAGNOSIS — R4189 Other symptoms and signs involving cognitive functions and awareness: Secondary | ICD-10-CM | POA: Diagnosis not present

## 2020-06-13 DIAGNOSIS — J439 Emphysema, unspecified: Secondary | ICD-10-CM | POA: Diagnosis not present

## 2020-06-13 DIAGNOSIS — Z7401 Bed confinement status: Secondary | ICD-10-CM | POA: Diagnosis not present

## 2020-06-13 DIAGNOSIS — N390 Urinary tract infection, site not specified: Secondary | ICD-10-CM | POA: Diagnosis not present

## 2020-06-13 DIAGNOSIS — H353131 Nonexudative age-related macular degeneration, bilateral, early dry stage: Secondary | ICD-10-CM | POA: Diagnosis not present

## 2020-06-13 DIAGNOSIS — I639 Cerebral infarction, unspecified: Secondary | ICD-10-CM | POA: Diagnosis not present

## 2020-06-13 DIAGNOSIS — M6281 Muscle weakness (generalized): Secondary | ICD-10-CM | POA: Diagnosis not present

## 2020-06-13 DIAGNOSIS — R260 Ataxic gait: Secondary | ICD-10-CM | POA: Diagnosis not present

## 2020-06-13 DIAGNOSIS — R1312 Dysphagia, oropharyngeal phase: Secondary | ICD-10-CM | POA: Diagnosis not present

## 2020-06-13 DIAGNOSIS — Z8673 Personal history of transient ischemic attack (TIA), and cerebral infarction without residual deficits: Secondary | ICD-10-CM | POA: Diagnosis not present

## 2020-06-13 DIAGNOSIS — R918 Other nonspecific abnormal finding of lung field: Secondary | ICD-10-CM | POA: Diagnosis not present

## 2020-06-13 DIAGNOSIS — K529 Noninfective gastroenteritis and colitis, unspecified: Secondary | ICD-10-CM | POA: Diagnosis not present

## 2020-06-13 DIAGNOSIS — Z03818 Encounter for observation for suspected exposure to other biological agents ruled out: Secondary | ICD-10-CM | POA: Diagnosis not present

## 2020-06-13 DIAGNOSIS — R1319 Other dysphagia: Secondary | ICD-10-CM | POA: Diagnosis not present

## 2020-06-13 DIAGNOSIS — Z20822 Contact with and (suspected) exposure to covid-19: Secondary | ICD-10-CM | POA: Diagnosis not present

## 2020-06-13 DIAGNOSIS — H53461 Homonymous bilateral field defects, right side: Secondary | ICD-10-CM | POA: Diagnosis not present

## 2020-06-13 DIAGNOSIS — B9689 Other specified bacterial agents as the cause of diseases classified elsewhere: Secondary | ICD-10-CM | POA: Diagnosis not present

## 2020-06-13 DIAGNOSIS — I10 Essential (primary) hypertension: Secondary | ICD-10-CM | POA: Diagnosis not present

## 2020-06-13 DIAGNOSIS — I63432 Cerebral infarction due to embolism of left posterior cerebral artery: Secondary | ICD-10-CM | POA: Diagnosis not present

## 2020-06-13 DIAGNOSIS — E7849 Other hyperlipidemia: Secondary | ICD-10-CM | POA: Diagnosis not present

## 2020-06-13 DIAGNOSIS — R2689 Other abnormalities of gait and mobility: Secondary | ICD-10-CM | POA: Diagnosis not present

## 2020-06-13 DIAGNOSIS — R05 Cough: Secondary | ICD-10-CM | POA: Diagnosis not present

## 2020-06-13 DIAGNOSIS — R5381 Other malaise: Secondary | ICD-10-CM | POA: Diagnosis not present

## 2020-06-13 DIAGNOSIS — R498 Other voice and resonance disorders: Secondary | ICD-10-CM | POA: Diagnosis not present

## 2020-06-13 DIAGNOSIS — J188 Other pneumonia, unspecified organism: Secondary | ICD-10-CM | POA: Diagnosis not present

## 2020-06-13 DIAGNOSIS — J479 Bronchiectasis, uncomplicated: Secondary | ICD-10-CM | POA: Diagnosis not present

## 2020-06-13 LAB — CBC WITH DIFFERENTIAL/PLATELET
Abs Immature Granulocytes: 0.02 10*3/uL (ref 0.00–0.07)
Basophils Absolute: 0.1 10*3/uL (ref 0.0–0.1)
Basophils Relative: 1 %
Eosinophils Absolute: 0.2 10*3/uL (ref 0.0–0.5)
Eosinophils Relative: 2 %
HCT: 39.6 % (ref 36.0–46.0)
Hemoglobin: 12.5 g/dL (ref 12.0–15.0)
Immature Granulocytes: 0 %
Lymphocytes Relative: 18 %
Lymphs Abs: 1.9 10*3/uL (ref 0.7–4.0)
MCH: 32.6 pg (ref 26.0–34.0)
MCHC: 31.6 g/dL (ref 30.0–36.0)
MCV: 103.4 fL — ABNORMAL HIGH (ref 80.0–100.0)
Monocytes Absolute: 1.2 10*3/uL — ABNORMAL HIGH (ref 0.1–1.0)
Monocytes Relative: 12 %
Neutro Abs: 6.9 10*3/uL (ref 1.7–7.7)
Neutrophils Relative %: 67 %
Platelets: 239 10*3/uL (ref 150–400)
RBC: 3.83 MIL/uL — ABNORMAL LOW (ref 3.87–5.11)
RDW: 12.3 % (ref 11.5–15.5)
WBC: 10.2 10*3/uL (ref 4.0–10.5)
nRBC: 0 % (ref 0.0–0.2)

## 2020-06-13 LAB — PHOSPHORUS: Phosphorus: 3.3 mg/dL (ref 2.5–4.6)

## 2020-06-13 LAB — COMPREHENSIVE METABOLIC PANEL
ALT: 14 U/L (ref 0–44)
AST: 22 U/L (ref 15–41)
Albumin: 3.1 g/dL — ABNORMAL LOW (ref 3.5–5.0)
Alkaline Phosphatase: 60 U/L (ref 38–126)
Anion gap: 7 (ref 5–15)
BUN: 18 mg/dL (ref 8–23)
CO2: 26 mmol/L (ref 22–32)
Calcium: 9.2 mg/dL (ref 8.9–10.3)
Chloride: 106 mmol/L (ref 98–111)
Creatinine, Ser: 1.28 mg/dL — ABNORMAL HIGH (ref 0.44–1.00)
GFR calc Af Amer: 43 mL/min — ABNORMAL LOW (ref >60)
GFR calc non Af Amer: 37 mL/min — ABNORMAL LOW (ref >60)
Glucose, Bld: 100 mg/dL — ABNORMAL HIGH (ref 70–99)
Potassium: 4 mmol/L (ref 3.5–5.1)
Sodium: 139 mmol/L (ref 135–145)
Total Bilirubin: 0.8 mg/dL (ref 0.3–1.2)
Total Protein: 6.1 g/dL — ABNORMAL LOW (ref 6.5–8.1)

## 2020-06-13 LAB — MAGNESIUM: Magnesium: 2.1 mg/dL (ref 1.7–2.4)

## 2020-06-13 NOTE — Progress Notes (Signed)
  Speech Language Pathology Treatment: Dysphagia  Patient Details Name: Valerie Young MRN: [id number] DOB: 01-31-1929 Today's Date: 06/13/2020 Time: 0045-9977 SLP Time Calculation (min) (ACUTE ONLY): 15 min  Assessment / Plan / Recommendation Clinical Impression  Yesterday pt developed nausea and expectoration of phlegm and complaints of dysphagia. This morning pt encountered in chair eating breakfast, denies nausea or difficulty prior to ST arriving. She consumed multiple straw sips thin without s/s aspiration and timely and efficient mastication of breakfast potatoes. CXR yesterday did not appear concerning for pna. Pt does have h/s lung cancer and now stroke increasing risk- educated to take breaks if SOB. Pt eats at a slow rate. She prefers chopped meats therefore texture downgraded to Dys 3. Recommend follow up at [facility] but will keep on caseload until discharges.    HPI HPI: 86 y.o. female with medical history significant of hypertension, hyperlipidemia, chronic kidney disease stage III with baseline creatinine 1.4, hypothyroidism, history of lung adenocarcinoma who arrives to ED on 7/13 with confusion, n/v. MRI head revelas multiple ischemic CVAs involving L PCA and R MLF pathway; brain regions including L hippocampus, L occipital lobe, L dorsal thalamus, bilateral cerebellar, and R midbrain. 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 No acute cardiopulmonary disease      SLP Plan  Continue with current plan of care       Recommendations  Diet recommendations: Dysphagia 3 (mechanical soft) Liquids provided via: Straw;Cup Medication Administration: Whole meds with puree Supervision: Patient able to self feed;Intermittent supervision to cue for compensatory strategies (need set up and check in due to visual disturbance) Compensations: Slow rate;Small sips/bites;Minimize environmental distractions;Other  (Comment) Postural Changes and/or Swallow Maneuvers: Seated upright 90 degrees                Oral Care Recommendations: Oral care BID Follow up Recommendations: Skilled Nursing facility SLP Visit Diagnosis: Dysphagia, unspecified (R13.10) Plan: Continue with current plan of care       GO                Houston Siren 06/13/2020, 9:35 AM    Orbie Pyo Colvin Caroli.Ed Risk analyst [phone] Office [phone]

## 2020-06-13 NOTE — Discharge Summary (Signed)
Physician Discharge Summary  Valerie Young KXF:[id number] DOB: 1929/08/01 DOA: 06/06/2020  PCP: Collene Gobble, MD  Admit date: 06/06/2020 Discharge date: 06/13/2020  Admitted From: Home Disposition:  SNF  Recommendations for Outpatient Follow-up:  1. Follow up with PCP in 1-2 weeks 2. Follow up with Neurology within 1-2 weeks 3. Follow up with EP Cardiology for follow up for Wound Check 4. Please obtain CMP/CBC, Mag, Phos in one week 5. Please follow up on the following pending results:  Home Health: No Equipment/Devices: None  Discharge Condition: Stable  CODE STATUS: FULL CODE Diet recommendation: Dysphagia 3 Diet   Brief/Interim Summary: The patient is a 86 year old occasion female with a past medical history significant for but not limited to hypertension, hyperlipidemia, chronic kidney stage IIIbwith a baseline creatinine of 1.4 hypothyroidism, history of lung adenocarcinoma as well as other comorbidities who came to the hospital for further evaluation of her confusion as well as having persistent nausea vomiting. Patient states that she was in her normal usual state of health and remembers eating a small piece of sausage in the afternoon and subsequently felt ill. She was seen by her neighbor sitting on the porch very confused and acting very different. She stated that she felt very ill and unable to state what happened to her. She reported nausea with vomiting and epigastric abdominal pain she is brought to the emergency room for her persisting symptoms. Initially in the ED she was given IV fluid resuscitation and confusion improved however again she continued to have some nausea vomiting. On admission she had a CBC showing a WBC of 17.7 which is now improved to 12.2. There is no clear source of infection noted and she had a normal ammonia level. TSH was also within normal limits at 4.041,however T4 was slightly elevated.Urinalysis was not suggestive of infection but did  show five ketones, moderate leukocytes, negative nitrites, few bacteria, 0-5 RBCs per high-power field, 0-5 squamous epithelial cells and 11-20 WBCs. Urine culture still pending. She had a head CT without contrast which showed no acute intracranial pathology and showed moderate age-related atrophy and chronic microvascular ischemic changes.   **Interim History A few days ago she had eye deviation and further work-up was initiated and she had an MRI and an MRI of the brain and neck and she is found to have an acute ischemic CVA with left PCA-P2 segment occlusion.  Further work-up was initiated and she had a CTA of the head and neck done in a transthoracic echo was ordered and was done and still pending read.  She was started on aspirin 325 mg p.o. daily and given permissive hypertension.  Hemoglobin A1c and lipid panel ordered and she is placed on [facility] and she is n.p.o. until she passes full screen.  PT OT recommending SNF for placement.  She also underwent an EEG with nonspecific cortical dysfunction in the temporal region but no seizures or epileptiform activity was seen throughout the recording.  Because neurology feels that this is embolic so she underwent a lower extremity duplex which was negative for DVT.  I discussed the case with neurology Dr. Erlinda Hong who is going to be ordering a a loop recorder to be placed prior to discharge.    Patient underwent a CTA of the chest and a CT of the abdomen pelvis with contrast on 06/09/2020 and showed no evidence of abnormality in the abdomen/pelvis and no evidence of abdominal pelvic metastatic disease.  She was noted to have colonic diverticulosis without  diverticulitis and the lungs were also evaluated and did not show any embolus.  She did have a right upper lobe lobectomy and a prior left lobe subpleural nodule which was was essentially unchanged.  She did also have a new 5 mm subpleural nodule in the left lower lobe and a 3 mm nodule in the central right  lung.  PET CT scan was recommended.  Of note she did have a Klebsiella UTI present on admission however because of her confusion we will start treatment with p.o. Keflex given that it is pansensitive.  06/10/2020 Overnight she became confused and pulled out her IVs she is sundowning.  Her renal function bumped slightly so she was started back on IV fluid hydration with normal saline given that she had contrast for her studies yesterday.  Her WBC also worsened so we will continue to monitor carefully.  06/11/2020 She is going to likely get a loop recorder done tomorrow.  Her leukocytosis is improved and was 10.5 today.  Creatinine is also improved after IV fluid hydration and now IV fluids have now stopped.  **Yesterday patient is going for a loop recorder and she will be continued on antibiotics for 2 more days.  She had no complaints and has had no more nausea or vomiting.  She is stable to be discharged to skilled nursing facility will need continued rehab and will be discharged on aspirin and Plavix at the recommendation of neurology.  She will need to follow-up with neurology in 4 to 6 weeks.  Updated the patient's daughter and she was thankful for the care.  Addendum 06/12/20: Patient was stable to be discharged to skilled nursing facility today and she ended up getting her loop recorder placed however after the loop recorder she started having some nausea and started spitting up some phlegm and stated that it was difficult for her to swallow.  UInLikely this is not a side effect from her anesthesia as Local Anesthesia and Lidocaine was used.  The nurse paged me and stated that the patient was not feeling well after the procedure and had upset stomach and was vomiting.  The nurse had not given the Zofran so I told her to give that.  I spoke with the patient's daughter and the daughter concerned about transferring to the facility tonight.  We will watch the patient closely and hold her discharge  tonight and reevaluate the patient in the a.m.  We will get SLP to come by eminently in the morning to reassess the patient's swallowing.  Addendum 06/13/20: Discharge was canceled yesterday given the patient's nausea and coughing up phlegm.  Speech therapy was consulted and they downgraded her diet of dysphagia 3 she is much improved today and is no longer nauseous or vomiting and is stable from a medical perspective to be discharged.  Yesterday some of her phlegm with a little blood-tinged but she is on aspirin and Plavix now will need to be closely monitored.  Her hemoglobin did not drop and her chest x-ray is unremarkable.  She is stable to be discharged and will need to follow-up at SNF with PT OT as well as speech therapy and follow-up with neurology in outpatient setting with Dr. Leonie Man.   Discharge Diagnoses:  Principal Problem:   Acute gastroenteritis Active Problems:   Chronic kidney disease (CKD), stage IV (severe) (HCC)   COPD (chronic obstructive pulmonary disease) with emphysema (HCC)   Essential hypertension, benign   Hypothyroidism   Adenocarcinoma, lung ([place])   Essential  hypertension   Gastroenteritis   Cerebral embolism with cerebral infarction  Acute CVA -Started having difficulty with her left eye vision and had some deviation yesterday afternoon -Head CT Scan showed "No acute intracranial pathology. Moderate age-related atrophy and chronic microvascular ischemic changes." -CTA Neck showed "Atherosclerotic disease of the carotid bifurcation bilaterally without significant carotid stenosis. No significant vertebral artery stenosis. Mild stenosis origin of left vertebral artery. Right PICA patent. Left PICA not visualized. Bilateral AICA patent. Post radiation changes left upper lobe. Aortic Atherosclerosis."  -Neurology consulted and appreciate their recommendations  -Stroke team following now -EEG also done and as below which showed nonspecific cortical dysfunction in  temporal region but no seizures or epileptiform activity seen throughout the recording. -PT OT and SLP to evaluate and treat and SLP recommends regular diet with thin liquids and PT OT recommending SNF and social work is assisting with placement Continue neurochecks per protocol  -continue telemetry monitoring -Check hemoglobin A1c and was 5.9 -Patient's panel showed a total cholesterol/HDL ratio 2.6, cholesterol level of 141, HDL level 55, LDL of 70, triglycerides of 82, VLDL 16 -Started on aspirin 325 by neurology and will also continue her pravastatin will defer to neurology to change to atorvastatin but for now she will be continued on pravastatin; I discussed the case with Dr. Erlinda Hong we will start the patient on dual antiplatelet therapy now for 3 weeks and then change back to aspirin 81 mg p.o. daily.  I spoke with Dr. Leonie Man given that Dr. Erlinda Hong has documented that patient is on 325 aspirin and Dr. Leonie Man confirms and recommends changing to aspirin 81 mg p.o. daily as well as Plavix to 5 mg p.o. daily and continuing the Plavix for 3 weeks and then continue aspirin indefinitely after that -further care per neurology and echocardiogram was done and Dr Erlinda Hong considering implanting a loop recorder given her unclear etiology of her embolic CVA and is able to be done Monday -CTA of the chest and CT of the abdomen pelvis done and as below -Loop recorder to be done prior to discharge -Continue PT OT and speech therapy at SNF -She is stable to be discharged to SNF and will need to repeat Covid test which is currently being done  Intractable nausea vomiting likely due to acute gastroenteritis, improved but had some yesterday so discharge was canceled -Patient's reports possible food poisoning but symptoms could have been related to UTI.No other significant history reported. -WBC is trending down and went from 17.7 -> and is now 10.2 -IV fluid hydration is now stopped initially but will resume again given her  mild elevation in her renal function from yesterday given the contrast -Continue with Antiemetics withp.o./IV Zofran every 6 hours as needed for nausea; she is given a dose of IV Zofran as well as a dose of IV Compazine in the ED -She is on a dysphagia 3 diet now tolerating this well without issues -CT of the abdomen pelvis with contrast showed "No acute abnormality in the abdomen/pelvis. No evidence of abdominopelvic metastatic disease. Colonic diverticulosis without diverticulitis." -We will start empiric treatment for Klebsiella UTI present on admission  Klebsiella UTI present on admission -Presented with a leukocytosis, encephalopathy and urinalysis done showed clear appearance with moderate leukocytes, few bacteria, 11-20 WBCs and urine culture grew out greater than 100,000 colony-forming units of Klebsiella -WBC is improved to 10.4 today -We will start p.o. Keflex given that she likely was symptomatic with her confusion nausea and vomiting -IV fluid hydration is now  stopped and will continue antibiotics for 1 more days  Acute metabolic encephalopathy likely from above, initially improved but with waxing and waning -Acute encephalopathy was improvingand head CT was negative as above however she was confused yesterday and a little this morning and will continue with delirium precautions -Work upgrossly unrevealing with essentially normal ABG, ammonia level,no acute signs of infection. -Continue with pain control with p.o. Norco/Vicodin and IV morphine -She was confused and agitated overnight and pulled out her IVs -PT OT recommending SNF -Continue to monitor carefully and will treat her acute CVA with aspirin and Plavix and also treat her urinary tract infection with p.o. antibiotics -Neuro work-up as above -we will place on delirium precautions  Hypertension -Blood pressure stable but slightly elevated as her blood pressure was  149/48 -ContinuedAmlodipine5 mg po Daily and  Atenolol50 mg po Daily but likely will allow for permissive hypertension and will stop amlodipine  Hypothyroidism -Thyroid function appears to be within normal limitsat 4.041 -Free T4 was slightly elevated at 1.19 -ContinueLevothyroxine50 mcg po Daily  Hyperlipidemia -Lipid panel as above -Continue withPravastatin 40 m p.o. daily defer to neurology to change to atorvastatin 80 mg p.o. nightly but they feel that they will not do more intensive statin given her advanced age and LDL being at goal  ChronicKidneyDiseaseStage IIIa,  Metabolic Acidosis -Renal function is stable,with most recent creatinine being 1.4 -BUN/creatinine went from 34/1.29 on admission an had initially trended down but bumped up in the setting of contrast but is now improved and BUN/creatinine is 20/1.14 yesterday but slightly worsened today and is 18/1.28 -Gentle IV fluid hydration with normal saline is now stopped -Patient CO2 on admission was 21, chloride level was 104, and anion gap was 14; now CO2 is 26, anion gap is 7 and chloride level is 106 -avoid nephrotoxic medications, contrast dyes, hypotension and renally adjust medications  -Repeat CMP in a.m.  History ofLungAdenocarcinoma -Patient continue to follow-up routinely with pulmonology for surveillance of cancer recurrence -After discussion with neurology will obtain a pan scan of her tomorrow and obtain a CT scan of the chest abdomen and pelvis to look for potential causes for her embolic stroke and given her hypercoagulability given history of lung cancer -CTA of the chest with contrast done showed "No pulmonary embolus. Post right upper lobectomy. 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 Previous left lower lobe subpleural nodule in the medial left lung base measures 14 x 10 mm, previously 12 x 9 mm in 2019. Lack of significant interval change favors post treatment related  change, however not definitively characterized. Subpleural area of consolidation in the medial right lower lobe has increased in size and density from 2019 when it was ground-glass. This is indeterminate. New/increased 5 mm subpleural nodule in the left lower lobe. 3 mm nodule in the central right lung, not definitively seen on prior exam." -Recommendation is for  "PET CT may be helpful for evaluation of metabolic activity of the larger lesions. Smaller nodules are too small for PET characterization." and this can be done in the outpatient setting  Acquired Thrombophilia -Has a history of a PE and DVT in the past and has a history of lung cancer -Previously was anticoagulated with Coumadin but no longer taking the Coumadin -She had further work-up in her lower extremity DVT study was negative for DVTs currently -CTA PE did not show any evidence of PE -Currently going to be treated with p.o. aspirin and  Plavix for her CVA per neurology recommendations  Discharge Instructions  Discharge Instructions    Ambulatory referral to Neurology   Complete by: As directed    Follow up with Dr. Leonie Man at [facility] in 4-6 weeks. Too complicated for NP to follow. Thanks.   Call MD for:  difficulty breathing, headache or visual disturbances   Complete by: As directed    Call MD for:  extreme fatigue   Complete by: As directed    Call MD for:  hives   Complete by: As directed    Call MD for:  persistant dizziness or light-headedness   Complete by: As directed    Call MD for:  persistant nausea and vomiting   Complete by: As directed    Call MD for:  redness, tenderness, or signs of infection (pain, swelling, redness, odor or green/yellow discharge around incision site)   Complete by: As directed    Call MD for:  severe uncontrolled pain   Complete by: As directed    Call MD for:  temperature >100.4   Complete by: As directed    Diet - low sodium heart healthy   Complete by: As directed    DYSPHAGIA 3  DIET   Discharge instructions   Complete by: As directed    You were cared for by a hospitalist during your hospital stay. If you have any questions about your discharge medications or the care you received while you were in the hospital after you are discharged, you can call the unit and ask to speak with the hospitalist on call if the hospitalist that took care of you is not available. Once you are discharged, your primary care physician will handle any further medical issues. Please note that NO REFILLS for any discharge medications will be authorized once you are discharged, as it is imperative that you return to your primary care physician (or establish a relationship with a primary care physician if you do not have one) for your aftercare needs so that they can reassess your need for medications and monitor your lab values.  Follow up with PCP and Neurology within the coming weeks. Take all medications as prescribed. If symptoms change or worsen please return to the ED for evaluation   Increase activity slowly   Complete by: As directed      Allergies as of 06/13/2020      Reactions   Sulfamethoxazole-trimethoprim Rash      Medication List    STOP taking these medications   atorvastatin 10 MG tablet Commonly known as: LIPITOR     TAKE these medications   acetaminophen 325 MG tablet Commonly known as: TYLENOL Take 650 mg by mouth 3 (three) times daily. TWO TO THREE TIMES A DAY   albuterol 108 (90 Base) MCG/ACT inhaler Commonly known as: VENTOLIN HFA Inhale 2 puffs into the lungs every 6 (six) hours as needed for wheezing or shortness of breath.   amLODipine 5 MG tablet Commonly known as: NORVASC TAKE 1 TABLET (5 MG TOTAL) BY MOUTH AT BEDTIME. What changed: See the new instructions.   aspirin EC 81 MG tablet Take 1 tablet (81 mg total) by mouth daily. Swallow whole.   atenolol 50 MG tablet Commonly known as: TENORMIN Take 1 tablet (50 mg total) by mouth daily.    cephALEXin 500 MG capsule Commonly known as: KEFLEX Take 1 capsule (500 mg total) by mouth every 12 (twelve) hours for 2 days.   cetirizine 10 MG tablet Commonly known as:  ZYRTEC TAKE 1 TABLET (10 MG TOTAL) BY MOUTH AT BEDTIME. What changed: See the new instructions.   clopidogrel 75 MG tablet Commonly known as: Plavix Take 1 tablet (75 mg total) by mouth daily for 21 days.   guaiFENesin 600 MG 12 hr tablet Commonly known as: MUCINEX Take 2 tablets (1,200 mg total) by mouth 2 (two) times daily.   levothyroxine 50 MCG tablet Commonly known as: SYNTHROID Take 50 mcg by mouth daily before breakfast.   ondansetron 4 MG tablet Commonly known as: ZOFRAN Take 1 tablet (4 mg total) by mouth every 6 (six) hours as needed for nausea.   polyethylene glycol 17 g packet Commonly known as: MIRALAX / GLYCOLAX Take 17 g by mouth daily as needed for mild constipation.   polyvinyl alcohol 1.4 % ophthalmic solution Commonly known as: LIQUIFILM TEARS Place 1 drop into both eyes as needed for dry eyes.   pravastatin 40 MG tablet Commonly known as: PRAVACHOL Take 40 mg by mouth daily.   Vitamin D-3 25 MCG (1000 UT) Caps Take 3,000 Units by mouth daily.     ASK your doctor about these medications    stroke: mapping our early stages of recovery book Misc 1 each by Does not apply route once for 1 dose. Ask about: Should I take this medication?       Contact information for follow-up providers    Garvin Fila, MD. Schedule an appointment as soon as possible for a visit in 4 week(s).   Specialties: Neurology, Radiology Contact information: [address] [place] [address] [phone]        Collene Gobble, MD. Call.   Specialty: Pulmonary Disease Contact information: [address] [place] [address] [phone]        [facility] Follow up on 06/27/2020.   Why: at 1030 for post loop  recorder wound check Contact information: [place] [address] [phone]           Contact information for after-discharge care    Destination    [facility] .   Service: Skilled Nursing Contact information: [place] [address] [phone]                 Allergies  Allergen Reactions  . Sulfamethoxazole-Trimethoprim Rash    Consultations:  Neurology  Procedures/Studies: EEG  Result Date: 06/08/2020 Lora Havens, MD     06/08/2020 12:20 PM Patient Name: MILANY GECK MRN: [id number] Epilepsy Attending: Lora Havens Referring Physician/Provider: Dr Baird Kay Date: 06/08/2020 Duration: 24.35 mins Patient history: 86yo F with abnormal eye movements and left eye was deviated to the left with nystagmus. EEG to evaluate for seizure. Level of alertness: Awake, drowsy AEDs during EEG study: None Technical aspects: This EEG study was done with scalp electrodes positioned according to the 10-20 International system of electrode placement. Electrical activity was acquired at a sampling rate of 500Hz  and reviewed with a high frequency filter of 70Hz  and a low frequency filter of 1Hz . EEG data were recorded continuously and digitally stored. Description: The posterior dominant rhythm consists of 8 Hz activity of moderate voltage (25-35 uV) seen predominantly in posterior head regions, symmetric and reactive to eye opening and eye closing. Drowsiness was characterized by attenuation of the posterior background rhythm.  EEG showed intermittent left temporal 3 to 6 Hz theta-delta slowing.  Hyperventilation and photic stimulation were  not performed.   ABNORMALITY -Intermittent slow, left temporal region IMPRESSION: This study is suggestive of non specific cortical dysfunction in left temporal region.  No seizures or epileptiform discharges were seen throughout the recording. Lora Havens    DG Chest 2 View  Result Date: 06/06/2020 CLINICAL DATA:  Weakness, malaise EXAM: CHEST - 2 VIEW COMPARISON:  10/09/2018 FINDINGS: Surgical clips are seen within the right hilum in surgical staple line is seen within the right suprahilar region likely related to right upper lobectomy. There is chronic collapse and consolidation of the left upper lobe with associated left-sided volume loss. No superimposed confluent pulmonary infiltrate. No pneumothorax or pleural effusion. Cardiac size within normal limits. The pulmonary vascularity is normal. No acute bone abnormality. IMPRESSION: Stable examination with chronic collapse of the left upper lobe and probable surgical changes of right upper lobectomy. No acute cardiopulmonary disease. Electronically Signed   By: Fidela Salisbury MD   On: 06/06/2020 22:29   DG Abd 1 View  Result Date: 06/07/2020 CLINICAL DATA:  86 year old female with nausea and vomiting. EXAM: ABDOMEN - 1 VIEW COMPARISON:  CT abdomen pelvis dated 04/09/2017. FINDINGS: There is no bowel dilatation or evidence of obstruction. Colonic diverticulosis noted. No free air or radiopaque calculi. There is degenerative changes of the spine and scoliosis. No acute osseous pathology. IMPRESSION: Colonic diverticulosis.  No evidence of bowel obstruction. Electronically Signed   By: Anner Crete M.D.   On: 06/07/2020 03:26   CT HEAD WO CONTRAST  Result Date: 06/06/2020 CLINICAL DATA:  86 year old female with altered mental status. EXAM: CT HEAD WITHOUT CONTRAST TECHNIQUE: Contiguous axial images were obtained from the base of the skull through the vertex without intravenous contrast. COMPARISON:  None. FINDINGS: Brain: Moderate age-related atrophy and chronic microvascular ischemic changes. See there is no acute intracranial hemorrhage. No mass effect or midline shift. No extra-axial fluid collection. Vascular: No hyperdense vessel or unexpected calcification. Skull: Normal. Negative for fracture or  focal lesion. Sinuses/Orbits: No acute finding. Other: None IMPRESSION: 1. No acute intracranial pathology. 2. Moderate age-related atrophy and chronic microvascular ischemic changes. Electronically Signed   By: Anner Crete M.D.   On: 06/06/2020 22:36   CT ANGIO NECK W OR WO CONTRAST  Result Date: 06/08/2020 CLINICAL DATA:  Stroke. EXAM: CT ANGIOGRAPHY NECK TECHNIQUE: Multidetector CT imaging of the neck was performed using the standard protocol during bolus administration of intravenous contrast. Multiplanar CT image reconstructions and MIPs were obtained to evaluate the vascular anatomy. Carotid stenosis measurements (when applicable) are obtained utilizing NASCET criteria, using the distal internal carotid diameter as the denominator. CONTRAST:  15mL OMNIPAQUE IOHEXOL 350 MG/ML SOLN COMPARISON:  MRI head and MRA head 06/07/2020 FINDINGS: Aortic arch: Atherosclerotic disease aortic arch and proximal great vessels. Proximal great vessels are adequately patent. Mild stenosis in the innominate artery. Right carotid system: Calcified and noncalcified plaque right carotid bifurcation without significant stenosis or thrombus. Left carotid system: Mild atherosclerotic disease left common carotid artery without significant stenosis. Mild atherosclerotic disease left carotid bifurcation without significant stenosis or thrombus. Vertebral arteries: Right vertebral artery has 2 separate origins from the right subclavian artery. These join at the C4-5 level. No significant right vertebral artery stenosis. Right PICA patent. Right AICA patent. Mild stenosis at the origin of the left vertebral artery. Left vertebral artery is then patent to the basilar without significant additional stenosis. Left PICA not visualized. Left AICA is patent. Skeleton: Disc and facet degeneration throughout the cervical spine without acute skeletal abnormality.  Other neck: No soft tissue mass or adenopathy. Upper chest: Left upper lobe  partial collapse/scarring. This is chronic and unchanged from CT chest 04/29/2018 and may be related to prior radiation. IMPRESSION: 1. Atherosclerotic disease of the carotid bifurcation bilaterally without significant carotid stenosis. 2. No significant vertebral artery stenosis. Mild stenosis origin of left vertebral artery. Right PICA patent. Left PICA not visualized. Bilateral AICA patent. 3. Post radiation changes left upper lobe. 4.  Aortic Atherosclerosis (ICD10-I70.0). Electronically Signed   By: Franchot Gallo M.D.   On: 06/08/2020 14:05   CT ANGIO CHEST PE W OR WO CONTRAST  Result Date: 06/09/2020 CLINICAL DATA:  Shortness of breath. Lung nodule. History of lung cancer. Stroke. EXAM: CT ANGIOGRAPHY CHEST CT ABDOMEN AND PELVIS WITH CONTRAST TECHNIQUE: Multidetector CT imaging of the chest was performed using the standard protocol during bolus administration of intravenous contrast. Multiplanar CT image reconstructions and MIPs were obtained to evaluate the vascular anatomy. Multidetector CT imaging of the abdomen and pelvis was performed using the standard protocol during bolus administration of intravenous contrast. CONTRAST:  13mL OMNIPAQUE IOHEXOL 350 MG/ML SOLN COMPARISON:  Most recent chest CT 10/29/2018. FINDINGS: CTA CHEST FINDINGS Cardiovascular: There are no filling defects within the pulmonary arteries to suggest pulmonary embolus. The left upper lobe pulmonary arteries are tree attic. Atherosclerosis of the thoracic aorta. Limited aortic assessment given phase of contrast tailored to pulmonary artery evaluation. The heart is normal in size. Coronary artery calcifications. No pericardial effusion. Mediastinum/Nodes: No enlarged mediastinal or hilar lymph nodes. Left suprahilar assessment is slightly limited due to chronic consolidation. No visualized thyroid nodule. No esophageal wall thickening. Lungs/Pleura: Prior right upper lobectomy. Chronic left upper lobe collapse is likely post  radiation/post treatment related change. This is stable from 2019. Medial segment of the left lower lobe demonstrates areas of irregular scarring and bronchiectasis. Some subpleural nodularity in this region measures 14 x 10 mm, similar to prior. There is subpleural nodule in the left lower lobe mean diameter 5 mm that is new/increased in size from prior exam, series 6, image 65. Patchy subpleural ground-glass in the anterior right middle lobe appears chronic and may be post treatment related change. Subpleural area of consolidation in the medial right lower lobe has increased in size and density currently measuring approximately 2.3 x 1.3 cm, series 6, image 65 there is internal bronchogram. Previously there was ground-glass opacity in this region. 3 mm nodule in the central right lung, series 6, image 53 not definitively seen on prior. Underlying emphysema. Trace right pleural thickening dependently. No significant pleural effusion. Underlying trachea and central bronchi are patent. Musculoskeletal: Postsurgical change in the right hemithorax. No evidence of focal bone lesion. Chronic thinning of left anterior ribs likely post treatment related. Review of the MIP images confirms the above findings. CT ABDOMEN and PELVIS FINDINGS Hepatobiliary: No focal liver abnormality is seen. No gallstones, gallbladder wall thickening, or biliary dilatation. Pancreas: Slight prominence of the pancreatic duct without significant ductal dilatation. No peripancreatic inflammation or evidence of mass. Spleen: Normal in size without focal abnormality. Adrenals/Urinary Tract: No adrenal nodule. Bilateral renal parenchymal thinning and areas of scarring, more prominent on the left. No hydronephrosis. No evidence of focal renal lesion. Symmetric excretion on delayed phase imaging. Urinary bladder is partially distended, equivocal bladder wall thickening. Stomach/Bowel: Stomach is unremarkable. Administered enteric contrast is seen  within the distal small bowel and throughout the colon. No obstruction. No bowel inflammation. Normal appendix filled with contrast. Diverticulosis of the descending and  sigmoid colon without diverticulitis. Vascular/Lymphatic: Aorto bi-iliac atherosclerosis. No aortic aneurysm. Patent portal vein. No abdominopelvic adenopathy. Reproductive: Status post hysterectomy. No adnexal masses. Other: No ascites. No free air. Small fat containing umbilical hernia. Musculoskeletal: No focal bone lesion. Scoliosis and degenerative change throughout the spine. Degenerative change of both hips. Review of the MIP images confirms the above findings. IMPRESSION: Chest CTA: 1. No pulmonary embolus. 2. Post right upper lobectomy. 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 3. Previous left lower lobe subpleural nodule in the medial left lung base measures 14 x 10 mm, previously 12 x 9 mm in 2019. Lack of significant interval change favors post treatment related change, however not definitively characterized. 4. Subpleural area of consolidation in the medial right lower lobe has increased in size and density from 2019 when it was ground-glass. This is indeterminate. 5. New/increased 5 mm subpleural nodule in the left lower lobe. 3 mm nodule in the central right lung, not definitively seen on prior exam. 6. PET CT may be helpful for evaluation of metabolic activity of the larger lesions. Smaller nodules are too small for PET characterization. CT abdomen/pelvis: 1. No acute abnormality in the abdomen/pelvis. No evidence of abdominopelvic metastatic disease. 2. Colonic diverticulosis without diverticulitis. Aortic Atherosclerosis (ICD10-I70.0) and Emphysema (ICD10-J43.9). Electronically Signed   By: Keith Rake M.D.   On: 06/09/2020 15:46   MR ANGIO HEAD WO CONTRAST  Addendum Date: 06/07/2020   ADDENDUM REPORT: 06/07/2020 16:56 ADDENDUM: These results were called  by telephone at the time of interpretation on 06/07/2020 at 4:56 pm to provider Dr. Alfredia Ferguson , who verbally acknowledged these results. Electronically Signed   By: Kellie Simmering DO   On: 06/07/2020 16:56   Result Date: 06/07/2020 CLINICAL DATA:  Stroke, follow-up. EXAM: MRA HEAD WITHOUT CONTRAST TECHNIQUE: Angiographic images of the Circle of Willis were obtained using MRA technique without intravenous contrast. COMPARISON:  Brain MRI performed earlier the same day 06/07/2020, FINDINGS: The intracranial internal carotid arteries are patent without significant stenosis. The M1 middle cerebral arteries are patent without significant stenosis. No M2 proximal branch occlusion or high-grade proximal stenosis is identified. The anterior cerebral arteries are patent. No intracranial aneurysm is identified. The intracranial vertebral arteries are patent without significant stenosis. The left PICA is poorly delineated proximally. The basilar artery is patent without significant stenosis. The right posterior cerebral artery is patent without significant proximal stenosis. There is occlusion of the left posterior cerebral artery at the level of the distal P2 segment. A right posterior communicating artery is present. The left posterior communicating artery is hypoplastic or absent. 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 The proximal left PICA is poorly delineated. This may be due to small vessel size, high-grade stenosis or partial occlusion. No anterior circulation large vessel occlusion or proximal high-grade arterial stenosis. Electronically Signed: By: Kellie Simmering DO On: 06/07/2020 16:34   MR BRAIN WO CONTRAST  Result Date: 06/07/2020 CLINICAL DATA:  Encephalopathy. EXAM: MRI HEAD WITHOUT CONTRAST TECHNIQUE: Multiplanar, multiecho pulse sequences of the brain and surrounding structures were obtained without intravenous contrast. COMPARISON:  06/06/2020 head CT.  FINDINGS: Brain: Multifocal restricted diffusion involving the medial left occipital lobe with extension into the left hippocampal tail and body. Restricted diffusion involving the bilateral cerebellum with tiny foci of restricted diffusion involving the left superior cerebellar peduncle, dorsal left thalamus (5:74) and dorsomedial right mid brain (5:69, 67).  All of the aforementioned acute infarcts demonstrate correlative T2/FLAIR hyperintense signal and edema. Bilateral hippocampal sulcal remnant cysts. Moderate cerebral atrophy with ex vacuo dilatation. No midline shift, mass lesion or extra-axial fluid collection. Background scattered and confluent T2/FLAIR supratentorial white matter foci reflect chronic microvascular ischemic changes. Remote right pontine microhemorrhage. Vascular: Normal flow voids. Skull and upper cervical spine: Normal marrow signal. Sinuses/Orbits: Normal orbits. Sequela of bilateral lens replacement. Clear paranasal sinuses. No mastoid effusion. Other: None. IMPRESSION: Multifocal acute infarcts involving the left occipital lobe, left hippocampus, dorsal left thalamus, bilateral cerebellum, dorsomedial right mid brain and left superior cerebellar peduncle. Moderate cerebral atrophy. Moderate to severe chronic microvascular ischemic changes. These results were called by telephone at the time of interpretation on 06/07/2020 at 4:04 pm to provider [facility] , who verbally acknowledged these results. Electronically Signed   By: Primitivo Gauze M.D.   On: 06/07/2020 16:11   CT ABDOMEN PELVIS W CONTRAST  Result Date: 06/09/2020 CLINICAL DATA:  Shortness of breath. Lung nodule. History of lung cancer. Stroke. EXAM: CT ANGIOGRAPHY CHEST CT ABDOMEN AND PELVIS WITH CONTRAST TECHNIQUE: Multidetector CT imaging of the chest was performed using the standard protocol during bolus administration of intravenous contrast. Multiplanar CT image reconstructions and MIPs were obtained to evaluate  the vascular anatomy. Multidetector CT imaging of the abdomen and pelvis was performed using the standard protocol during bolus administration of intravenous contrast. CONTRAST:  2mL OMNIPAQUE IOHEXOL 350 MG/ML SOLN COMPARISON:  Most recent chest CT 10/29/2018. FINDINGS: CTA CHEST FINDINGS Cardiovascular: There are no filling defects within the pulmonary arteries to suggest pulmonary embolus. The left upper lobe pulmonary arteries are tree attic. Atherosclerosis of the thoracic aorta. Limited aortic assessment given phase of contrast tailored to pulmonary artery evaluation. The heart is normal in size. Coronary artery calcifications. No pericardial effusion. Mediastinum/Nodes: No enlarged mediastinal or hilar lymph nodes. Left suprahilar assessment is slightly limited due to chronic consolidation. No visualized thyroid nodule. No esophageal wall thickening. Lungs/Pleura: Prior right upper lobectomy. 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 This is stable from 2019. Medial segment of the left lower lobe demonstrates areas of irregular scarring and bronchiectasis. Some subpleural nodularity in this region measures 14 x 10 mm, similar to prior. There is subpleural nodule in the left lower lobe mean diameter 5 mm that is new/increased in size from prior exam, series 6, image 65. Patchy subpleural ground-glass in the anterior right middle lobe appears chronic and may be post treatment related change. Subpleural area of consolidation in the medial right lower lobe has increased in size and density currently measuring approximately 2.3 x 1.3 cm, series 6, image 65 there is internal bronchogram. Previously there was ground-glass opacity in this region. 3 mm nodule in the central right lung, series 6, image 53 not definitively seen on prior. Underlying emphysema. Trace right pleural thickening dependently. No significant pleural effusion. Underlying trachea and central bronchi  are patent. Musculoskeletal: Postsurgical change in the right hemithorax. No evidence of focal bone lesion. Chronic thinning of left anterior ribs likely post treatment related. Review of the MIP images confirms the above findings. CT ABDOMEN and PELVIS FINDINGS Hepatobiliary: No focal liver abnormality is seen. No gallstones, gallbladder wall thickening, or biliary dilatation. Pancreas: Slight prominence of the pancreatic duct without significant ductal dilatation. No peripancreatic inflammation or evidence of mass. Spleen: Normal in size without focal abnormality. Adrenals/Urinary Tract: No adrenal nodule. Bilateral renal parenchymal thinning and areas of scarring, more prominent on  the left. No hydronephrosis. No evidence of focal renal lesion. Symmetric excretion on delayed phase imaging. Urinary bladder is partially distended, equivocal bladder wall thickening. Stomach/Bowel: Stomach is unremarkable. Administered enteric contrast is seen within the distal small bowel and throughout the colon. No obstruction. No bowel inflammation. Normal appendix filled with contrast. Diverticulosis of the descending and sigmoid colon without diverticulitis. Vascular/Lymphatic: Aorto bi-iliac atherosclerosis. No aortic aneurysm. Patent portal vein. No abdominopelvic adenopathy. Reproductive: Status post hysterectomy. No adnexal masses. Other: No ascites. No free air. Small fat containing umbilical hernia. Musculoskeletal: No focal bone lesion. Scoliosis and degenerative change throughout the spine. Degenerative change of both hips. Review of the MIP images confirms the above findings. IMPRESSION: Chest CTA: 1. No pulmonary embolus. 2. Post right upper lobectomy. 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 3. Previous left lower lobe subpleural nodule in the medial left lung base measures 14 x 10 mm, previously 12 x 9 mm in 2019. Lack of significant interval  change favors post treatment related change, however not definitively characterized. 4. Subpleural area of consolidation in the medial right lower lobe has increased in size and density from 2019 when it was ground-glass. This is indeterminate. 5. New/increased 5 mm subpleural nodule in the left lower lobe. 3 mm nodule in the central right lung, not definitively seen on prior exam. 6. PET CT may be helpful for evaluation of metabolic activity of the larger lesions. Smaller nodules are too small for PET characterization. CT abdomen/pelvis: 1. No acute abnormality in the abdomen/pelvis. No evidence of abdominopelvic metastatic disease. 2. Colonic diverticulosis without diverticulitis. Aortic Atherosclerosis (ICD10-I70.0) and Emphysema (ICD10-J43.9). Electronically Signed   By: Keith Rake M.D.   On: 06/09/2020 15:46   DG CHEST PORT 1 VIEW  Result Date: 06/12/2020 CLINICAL DATA:  Productive cough and congestion EXAM: PORTABLE CHEST 1 VIEW COMPARISON:  06/06/2020, 10/09/2018 CT chest 06/09/2020, PET CT 05/20/2017 FINDINGS: Postsurgical changes on the right are unchanged. Chronic volume loss on the left with chronic collapse of the left upper lobe. Ovoid electronic device now evident over the left lower chest. No acute airspace disease. Stable cardiomediastinal silhouette with aortic atherosclerosis. No pneumothorax. IMPRESSION: No acute change compared with prior. Stable postsurgical changes and chronic left apical opacity/upper lobe collapse. Electronically Signed   By: Donavan Foil M.D.   On: 06/12/2020 20:44   ECHOCARDIOGRAM COMPLETE  Result Date: 06/08/2020    ECHOCARDIOGRAM REPORT   Patient Name:   Arieanna A Darius Date of Exam: 06/08/2020 Medical Rec #:  [id number]      Height:       64.0 in Accession #:    [id number]     Weight:       123.6 lb Date of Birth:  1929-05-21      BSA:          1.594 m Patient Age:    86 years       BP:           151/64 mmHg Patient Gender: F              HR:           72  bpm. Exam Location:  Inpatient Procedure: 2D Echo, Cardiac Doppler and Color Doppler Indications:    CVA  History:        Patient has prior history of Echocardiogram examinations, most  recent 11/23/2013. COPD; Risk Factors:Hypertension and                 Dyslipidemia. Lung cancer, CKD.  Sonographer:    Dustin Flock Referring Phys: [place]  1. Left ventricular ejection fraction, by estimation, is 60 to 65%. The left ventricle has normal function. The left ventricle has no regional wall motion abnormalities. There is mild asymmetric left ventricular hypertrophy of the basal-septal segment. Left ventricular diastolic parameters were normal.  2. Right ventricular systolic function is normal. The right ventricular size is normal. There is normal pulmonary artery systolic pressure.  3. The mitral valve is normal in structure. Trivial mitral valve regurgitation.  4. The aortic valve is tricuspid. Aortic valve regurgitation is not visualized. No aortic stenosis is present.  5. The inferior vena cava is normal in size with greater than 50% respiratory variability, suggesting right atrial pressure of 3 mmHg. FINDINGS  Left Ventricle: Left ventricular ejection fraction, by estimation, is 60 to 65%. The left ventricle has normal function. The left ventricle has no regional wall motion abnormalities. The left ventricular internal cavity size was normal in size. There is  mild asymmetric left ventricular hypertrophy of the basal-septal segment. Left ventricular diastolic parameters were normal. Right Ventricle: The right ventricular size is normal. Right vetricular wall thickness was not assessed. Right ventricular systolic function is normal. There is normal pulmonary artery systolic pressure. The tricuspid regurgitant velocity is 2.03 m/s, and with an assumed right atrial pressure of 3 mmHg, the estimated right ventricular systolic pressure is 29.7 mmHg. Left Atrium: Left atrial size  was normal in size. Right Atrium: Right atrial size was normal in size. Pericardium: There is no evidence of pericardial effusion. Mitral Valve: The mitral valve is normal in structure. Trivial mitral valve regurgitation. Tricuspid Valve: The tricuspid valve is normal in structure. Tricuspid valve regurgitation is trivial. Aortic Valve: The aortic valve is tricuspid. Aortic valve regurgitation is not visualized. No aortic stenosis is present. Pulmonic Valve: The pulmonic valve was not well visualized. Pulmonic valve regurgitation is trivial. Aorta: The aortic root and ascending aorta are structurally normal, with no evidence of dilitation. Venous: The inferior vena cava is normal in size with greater than 50% respiratory variability, suggesting right atrial pressure of 3 mmHg. IAS/Shunts: The interatrial septum was not well visualized.  LEFT VENTRICLE PLAX 2D LVIDd:         3.70 cm  Diastology LVIDs:         2.70 cm  LV e' lateral:   11.30 cm/s LV PW:         1.10 cm  LV E/e' lateral: 7.8 LV IVS:        1.20 cm  LV e' medial:    7.18 cm/s LVOT diam:     1.80 cm  LV E/e' medial:  12.2 LV SV:         47 LV SV Index:   30 LVOT Area:     2.54 cm  RIGHT VENTRICLE RV Basal diam:  2.50 cm RV S prime:     12.70 cm/s TAPSE (M-mode): 2.0 cm LEFT ATRIUM             Index       RIGHT ATRIUM           Index LA diam:        2.90 cm 1.82 cm/m  RA Area:     10.50 cm LA Vol (A2C):   28.2 ml 17.69  ml/m RA Volume:   23.70 ml  14.86 ml/m LA Vol (A4C):   30.3 ml 19.00 ml/m LA Biplane Vol: 31.1 ml 19.50 ml/m  AORTIC VALVE LVOT Vmax:   84.90 cm/s LVOT Vmean:  54.100 cm/s LVOT VTI:    0.186 m  AORTA Ao Root diam: 2.70 cm MITRAL VALVE                TRICUSPID VALVE MV Area (PHT): 4.15 cm     TR Peak grad:   16.5 mmHg MV Decel Time: 183 msec     TR Vmax:        203.00 cm/s MV E velocity: 87.80 cm/s MV A velocity: 103.00 cm/s  SHUNTS MV E/A ratio:  0.85         Systemic VTI:  0.19 m                             Systemic Diam: 1.80 cm  Oswaldo Milian MD Electronically signed by Oswaldo Milian MD Signature Date/Time: 06/08/2020/9:11:01 PM    Final    VAS [place] LOWER EXTREMITY VENOUS (DVT)  Result Date: 06/08/2020  Lower Venous DVTStudy Indications: Stroke.  Comparison Study: no prior Performing Technologist: Abram Sander RVS  Examination Guidelines: A complete evaluation includes B-mode imaging, spectral Doppler, color Doppler, and power Doppler as needed of all accessible portions of each vessel. Bilateral testing is considered an integral part of a complete examination. Limited examinations for reoccurring indications may be performed as noted. The reflux portion of the exam is performed with the patient in reverse Trendelenburg.  +---------+---------------+---------+-----------+----------+--------------+ RIGHT    CompressibilityPhasicitySpontaneityPropertiesThrombus Aging +---------+---------------+---------+-----------+----------+--------------+ CFV      Full           Yes      Yes                                 +---------+---------------+---------+-----------+----------+--------------+ SFJ      Full                                                        +---------+---------------+---------+-----------+----------+--------------+ FV Prox  Full                                                        +---------+---------------+---------+-----------+----------+--------------+ FV Mid   Full                                                        +---------+---------------+---------+-----------+----------+--------------+ FV DistalFull                                                        +---------+---------------+---------+-----------+----------+--------------+ PFV      Full                                                        +---------+---------------+---------+-----------+----------+--------------+  POP      Full           Yes      Yes                                  +---------+---------------+---------+-----------+----------+--------------+ PTV      Full                                                        +---------+---------------+---------+-----------+----------+--------------+ PERO     Full                                                        +---------+---------------+---------+-----------+----------+--------------+   +---------+---------------+---------+-----------+----------+--------------+ LEFT     CompressibilityPhasicitySpontaneityPropertiesThrombus Aging +---------+---------------+---------+-----------+----------+--------------+ CFV      Full           Yes      Yes                                 +---------+---------------+---------+-----------+----------+--------------+ SFJ      Full                                                        +---------+---------------+---------+-----------+----------+--------------+ FV Prox  Full                                                        +---------+---------------+---------+-----------+----------+--------------+ FV Mid   Full                                                        +---------+---------------+---------+-----------+----------+--------------+ FV DistalFull                                                        +---------+---------------+---------+-----------+----------+--------------+ PFV      Full                                                        +---------+---------------+---------+-----------+----------+--------------+ POP      Full           Yes      Yes                                 +---------+---------------+---------+-----------+----------+--------------+  PTV      Full                                                        +---------+---------------+---------+-----------+----------+--------------+ PERO     Full                                                         +---------+---------------+---------+-----------+----------+--------------+     Summary: BILATERAL: - No evidence of deep vein thrombosis seen in the lower extremities, bilaterally. -No evidence of popliteal cyst, bilaterally.   *See table(s) above for measurements and observations. Electronically signed by Monica Martinez MD on 06/08/2020 at 3:50:31 PM.    Final    Loop Recorder to be placed today  ECHOCARDIOGRAM IMPRESSIONS    1. Left ventricular ejection fraction, by estimation, is 60 to 65%. The  left ventricle has normal function. The left ventricle has no regional  wall motion abnormalities. There is mild asymmetric left ventricular  hypertrophy of the basal-septal segment.  Left ventricular diastolic parameters were normal.  2. Right ventricular systolic function is normal. The right ventricular  size is normal. There is normal pulmonary artery systolic pressure.  3. The mitral valve is normal in structure. Trivial mitral valve  regurgitation.  4. The aortic valve is tricuspid. Aortic valve regurgitation is not  visualized. No aortic stenosis is present.  5. The inferior vena cava is normal in size with greater than 50%  respiratory variability, suggesting right atrial pressure of 3 mmHg.   FINDINGS  Left Ventricle: Left ventricular ejection fraction, by estimation, is 60  to 65%. The left ventricle has normal function. The left ventricle has no  regional wall motion abnormalities. The left ventricular internal cavity  size was normal in size. There is  mild asymmetric left ventricular hypertrophy of the basal-septal segment.  Left ventricular diastolic parameters were normal.   Right Ventricle: The right ventricular size is normal. Right vetricular  wall thickness was not assessed. Right ventricular systolic function is  normal. There is normal pulmonary artery systolic pressure. The tricuspid  regurgitant velocity is 2.03 m/s,  and with an assumed right atrial  pressure of 3 mmHg, the estimated right  ventricular systolic pressure is 40.1 mmHg.   Left Atrium: Left atrial size was normal in size.   Right Atrium: Right atrial size was normal in size.   Pericardium: There is no evidence of pericardial effusion.   Mitral Valve: The mitral valve is normal in structure. Trivial mitral  valve regurgitation.   Tricuspid Valve: The tricuspid valve is normal in structure. Tricuspid  valve regurgitation is trivial.   Aortic Valve: The aortic valve is tricuspid. Aortic valve regurgitation is  not visualized. No aortic stenosis is present.   Pulmonic Valve: The pulmonic valve was not well visualized. Pulmonic valve  regurgitation is trivial.   Aorta: The aortic root and ascending aorta are structurally normal, with  no evidence of dilitation.   Venous: The inferior vena cava is normal in size with greater than 50%  respiratory variability, suggesting right atrial pressure of 3 mmHg.   IAS/Shunts: The interatrial septum was not well visualized.  LEFT VENTRICLE  PLAX 2D  LVIDd:     3.70 cm Diastology  LVIDs:     2.70 cm LV e' lateral:  11.30 cm/s  LV PW:     1.10 cm LV E/e' lateral: 7.8  LV IVS:    1.20 cm LV e' medial:  7.18 cm/s  LVOT diam:   1.80 cm LV E/e' medial: 12.2  LV SV:     47  LV SV Index:  30  LVOT Area:   2.54 cm     RIGHT VENTRICLE  RV Basal diam: 2.50 cm  RV S prime:   12.70 cm/s  TAPSE (M-mode): 2.0 cm   LEFT ATRIUM       Index    RIGHT ATRIUM      Index  LA diam:    2.90 cm 1.82 cm/m RA Area:   10.50 cm  LA Vol (A2C):  28.2 ml 17.69 ml/m RA Volume:  23.70 ml 14.86 ml/m  LA Vol (A4C):  30.3 ml 19.00 ml/m  LA Biplane Vol: 31.1 ml 19.50 ml/m  AORTIC VALVE  LVOT Vmax:  84.90 cm/s  LVOT Vmean: 54.100 cm/s  LVOT VTI:  0.186 m    AORTA  Ao Root diam: 2.70 cm   MITRAL VALVE        TRICUSPID VALVE  MV Area (PHT): 4.15 cm   TR  Peak grad:  16.5 mmHg  MV Decel Time: 183 msec   TR Vmax:    203.00 cm/s  MV E velocity: 87.80 cm/s  MV A velocity: 103.00 cm/s SHUNTS  MV E/A ratio: 0.85     Systemic VTI: 0.19 m               Systemic Diam: 1.80 cm   LE VENOUS DUPLEX BILATERAL:  - No evidence of deep vein thrombosis seen in the lower extremities,  bilaterally.  -No evidence of popliteal cyst, bilaterally.   EEG Description: The posterior dominant rhythm consists of8Hz  activity of moderate voltage (25-35 uV) seen predominantly in posterior head regions, symmetric and reactive to eye opening and eye closing. Drowsiness was characterized by attenuation of the posterior background rhythm. EEG showed intermittent left temporal3 to 6 Hz theta-delta slowing. Hyperventilation and photic stimulation were not performed.   ABNORMALITY -Intermittent slow, left temporal region  IMPRESSION: This study issuggestive ofnon specific cortical dysfunction in left temporal region.No seizures or epileptiform discharges were seen throughout the recording.  Subjective: Seen and examined at bedside and was resting and had no complaints.  No nausea or vomiting.  Knows that she will be going for loop recorder today.  She denies any other concerns or complaints and updated her patient's daughter and they are agreeable with the discharge.  Patient will be going to [facility] once her loop recorder is done and when her repeat Covid test results.  Discharge Exam: Vitals:   06/12/20 2350 06/13/20 0816  BP: (!) 146/54 (!) 149/48  Pulse: 64 62  Resp:  20  Temp: (!) 97.4 F (36.3 C) 98.5 F (36.9 C)  SpO2: 94% 92%   Vitals:   06/12/20 0829 06/12/20 1655 06/12/20 2350 06/13/20 0816  BP: (!) 153/58 (!) 155/58 (!) 146/54 (!) 149/48  Pulse: 63 63 64 62  Resp: 20 20  20   Temp: 98.1 F (36.7 C) 97.7 F (36.5 C) (!) 97.4 F (36.3 C) 98.5 F (36.9 C)  TempSrc:      SpO2: 97% 98% 94% 92%  Weight:  Height:       General: Pt is sleepy but is in no acute distress. Cardiovascular: RRR, S1/S2 +, no rubs, no gallops Respiratory: Diminished bilaterally, no wheezing, no rhonchi Abdominal: Soft, NT, ND, bowel sounds + Extremities: no edema, no cyanosis  The results of significant diagnostics from this hospitalization (including imaging, microbiology, ancillary and laboratory) are listed below for reference.    Microbiology: Recent Results (from the past 240 hour(s))  Urine culture     Status: Abnormal   Collection Time: 06/06/20  7:05 PM   Specimen: Urine, Random  Result Value Ref Range Status   Specimen Description URINE, RANDOM  Final   Special Requests   Final    NONE Performed at [facility], [address], [place], [place] [address]    Culture >=100,000 COLONIES/mL KLEBSIELLA PNEUMONIAE (A)  Final   Report Status 06/09/2020 FINAL  Final   Organism ID, Bacteria KLEBSIELLA PNEUMONIAE (A)  Final      Susceptibility   Klebsiella pneumoniae - MIC*    AMPICILLIN >=32 RESISTANT Resistant     CEFAZOLIN <=4 SENSITIVE Sensitive     CEFTRIAXONE <=0.25 SENSITIVE Sensitive     CIPROFLOXACIN <=0.25 SENSITIVE Sensitive     GENTAMICIN <=1 SENSITIVE Sensitive     IMIPENEM <=0.25 SENSITIVE Sensitive     NITROFURANTOIN <=16 SENSITIVE Sensitive     TRIMETH/SULFA <=20 SENSITIVE Sensitive     AMPICILLIN/SULBACTAM 8 SENSITIVE Sensitive     PIP/TAZO <=4 SENSITIVE Sensitive     * >=100,000 COLONIES/mL KLEBSIELLA PNEUMONIAE  SARS Coronavirus 2 by RT PCR (hospital order, performed in [place] hospital lab) Nasopharyngeal Nasopharyngeal Swab     Status: None   Collection Time: 06/07/20  2:22 AM   Specimen: Nasopharyngeal Swab  Result Value Ref Range Status   SARS Coronavirus 2 NEGATIVE NEGATIVE Final    Comment: (NOTE) SARS-CoV-2 target nucleic acids are NOT DETECTED.  The SARS-CoV-2 RNA is generally detectable in upper and lower respiratory specimens during the acute phase of  infection. The lowest concentration of SARS-CoV-2 viral copies this assay can detect is 250 copies / mL. A negative result does not preclude SARS-CoV-2 infection and should not be used as the sole basis for treatment or other patient management decisions.  A negative result may occur with improper specimen collection / handling, submission of specimen other than nasopharyngeal swab, presence of viral mutation(s) within the areas targeted by this assay, and inadequate number of viral copies (<250 copies / mL). A negative result must be combined with clinical observations, patient history, and epidemiological information.  Fact Sheet for Patients:   StrictlyIdeas.no  Fact Sheet for Healthcare Providers: BankingDealers.co.za  This test is not yet approved or  cleared by the [place] FDA and has been authorized for detection and/or diagnosis of SARS-CoV-2 by FDA under an Emergency Use Authorization (EUA).  This EUA will remain in effect (meaning this test can be used) for the duration of the COVID-19 declaration under Section 564(b)(1) of the Act, 21 U.S.C. section 360bbb-3(b)(1), unless the authorization is terminated or revoked sooner.  Performed at [facility], [place] [address] [place], [place] [address]   SARS Coronavirus 2 by RT PCR (hospital order, performed in [facility] hospital lab) Nasopharyngeal Nasopharyngeal Swab     Status: None   Collection Time: 06/12/20  2:31 PM   Specimen: Nasopharyngeal Swab  Result Value Ref Range Status   SARS Coronavirus 2 NEGATIVE NEGATIVE Final    Comment: (NOTE) SARS-CoV-2 target  nucleic acids are NOT DETECTED.  The SARS-CoV-2 RNA is generally detectable in upper and lower respiratory specimens during the acute phase of infection. The lowest concentration of SARS-CoV-2 viral copies this assay can detect is 250 copies / mL. A negative result does not preclude SARS-CoV-2 infection and  should not be used as the sole basis for treatment or other patient management decisions.  A negative result may occur with improper specimen collection / handling, submission of specimen other than nasopharyngeal swab, presence of viral mutation(s) within the areas targeted by this assay, and inadequate number of viral copies (<250 copies / mL). A negative result must be combined with clinical observations, patient history, and epidemiological information.  Fact Sheet for Patients:   StrictlyIdeas.no  Fact Sheet for Healthcare Providers: BankingDealers.co.za  This test is not yet approved or  cleared by the [place] FDA and has been authorized for detection and/or diagnosis of SARS-CoV-2 by FDA under an Emergency Use Authorization (EUA).  This EUA will remain in effect (meaning this test can be used) for the duration of the COVID-19 declaration under Section 564(b)(1) of the Act, 21 U.S.C. section 360bbb-3(b)(1), unless the authorization is terminated or revoked sooner.  Performed at [facility], [place] [address], [place], [place] [address]    Labs: BNP (last 3 results) No results for input(s): BNP in the last 8760 hours. Basic Metabolic Panel: Recent Labs  Lab 06/09/20 0148 06/10/20 0433 06/11/20 0413 06/12/20 0609 06/13/20 0301  NA 138 137 139 139 139  K 4.0 4.1 4.0 3.8 4.0  CL 105 100 105 105 106  CO2 23 26 25 24 26   GLUCOSE 92 112* 111* 109* 100*  BUN 20 19 23 20 18   CREATININE 1.12* 1.32* 1.13* 1.14* 1.28*  CALCIUM 9.4 9.9 9.2 9.2 9.2  MG 2.1 2.0 2.1 2.0 2.1  PHOS 2.2* 3.4 3.2 2.8 3.3   Liver Function Tests: Recent Labs  Lab 06/09/20 0148 06/10/20 0433 06/11/20 0413 06/12/20 0609 06/13/20 0301  AST 25 22 19 22 22   ALT 10 13 11 12 14   ALKPHOS 66 72 64 59 60  BILITOT 0.9 1.1 0.2* 0.7 0.8  PROT 6.6 7.2 6.7 6.2* 6.1*  ALBUMIN 3.6 3.8 3.3* 3.1* 3.1*   No results for input(s): LIPASE, AMYLASE in the  last 168 hours. Recent Labs  Lab 06/06/20 2217  AMMONIA 17   CBC: Recent Labs  Lab 06/09/20 0148 06/09/20 0148 06/10/20 0707 06/11/20 0413 06/12/20 0609 06/12/20 2056 06/13/20 0301  WBC 10.6*   < > 14.7* 10.5 10.4 10.2 10.2  NEUTROABS 7.0  --   --  6.9 7.2 7.6 6.9  HGB 13.3   < > 15.0 13.8 12.8 12.4 12.5  HCT 42.0   < > 45.9 42.8 40.6 38.7 39.6  MCV 100.2*   < > 101.3* 102.1* 102.8* 101.3* 103.4*  PLT 236   < > 230 233 227 239 239   < > = values in this interval not displayed.   Cardiac Enzymes: No results for input(s): CKTOTAL, CKMB, CKMBINDEX, TROPONINI in the last 168 hours. BNP: Invalid input(s): POCBNP CBG: Recent Labs  Lab 06/06/20 2214  GLUCAP 173*   D-Dimer No results for input(s): DDIMER in the last 72 hours. Hgb A1c No results for input(s): HGBA1C in the last 72 hours. Lipid Profile No results for input(s): CHOL, HDL, LDLCALC, TRIG, CHOLHDL, LDLDIRECT in the last 72 hours. Thyroid function studies No results for input(s): TSH, T4TOTAL, T3FREE, THYROIDAB in the last  72 hours.  Invalid input(s): FREET3 Anemia work up No results for input(s): VITAMINB12, FOLATE, FERRITIN, TIBC, IRON, RETICCTPCT in the last 72 hours. Urinalysis    Component Value Date/Time   COLORURINE YELLOW 06/07/2020 0059   APPEARANCEUR CLEAR 06/07/2020 0059   LABSPEC 1.015 06/07/2020 0059   PHURINE 6.0 06/07/2020 0059   GLUCOSEU NEGATIVE 06/07/2020 0059   HGBUR NEGATIVE 06/07/2020 0059   BILIRUBINUR NEGATIVE 06/07/2020 0059   KETONESUR 5 (A) 06/07/2020 0059   PROTEINUR 100 (A) 06/07/2020 0059   UROBILINOGEN 0.2 04/07/2015 0339   NITRITE NEGATIVE 06/07/2020 0059   LEUKOCYTESUR MODERATE (A) 06/07/2020 0059   Sepsis Labs Invalid input(s): PROCALCITONIN,  WBC,  LACTICIDVEN Microbiology Recent Results (from the past 240 hour(s))  Urine culture     Status: Abnormal   Collection Time: 06/06/20  7:05 PM   Specimen: Urine, Random  Result Value Ref Range Status   Specimen  Description URINE, RANDOM  Final   Special Requests   Final    NONE Performed at [facility], [address], [place], [place] [address]    Culture >=100,000 COLONIES/mL KLEBSIELLA PNEUMONIAE (A)  Final   Report Status 06/09/2020 FINAL  Final   Organism ID, Bacteria KLEBSIELLA PNEUMONIAE (A)  Final      Susceptibility   Klebsiella pneumoniae - MIC*    AMPICILLIN >=32 RESISTANT Resistant     CEFAZOLIN <=4 SENSITIVE Sensitive     CEFTRIAXONE <=0.25 SENSITIVE Sensitive     CIPROFLOXACIN <=0.25 SENSITIVE Sensitive     GENTAMICIN <=1 SENSITIVE Sensitive     IMIPENEM <=0.25 SENSITIVE Sensitive     NITROFURANTOIN <=16 SENSITIVE Sensitive     TRIMETH/SULFA <=20 SENSITIVE Sensitive     AMPICILLIN/SULBACTAM 8 SENSITIVE Sensitive     PIP/TAZO <=4 SENSITIVE Sensitive     * >=100,000 COLONIES/mL KLEBSIELLA PNEUMONIAE  SARS Coronavirus 2 by RT PCR (hospital order, performed in [facility] hospital lab) Nasopharyngeal Nasopharyngeal Swab     Status: None   Collection Time: 06/07/20  2:22 AM   Specimen: Nasopharyngeal Swab  Result Value Ref Range Status   SARS Coronavirus 2 NEGATIVE NEGATIVE Final    Comment: (NOTE) SARS-CoV-2 target nucleic acids are NOT DETECTED.  The SARS-CoV-2 RNA is generally detectable in upper and lower respiratory specimens during the acute phase of infection. The lowest concentration of SARS-CoV-2 viral copies this assay can detect is 250 copies / mL. A negative result does not preclude SARS-CoV-2 infection and should not be used as the sole basis for treatment or other patient management decisions.  A negative result may occur with improper specimen collection / handling, submission of specimen other than nasopharyngeal swab, presence of viral mutation(s) within the areas targeted by this assay, and inadequate number of viral copies (<250 copies / mL). A negative result must be combined with clinical observations, patient history, and epidemiological  information.  Fact Sheet for Patients:   StrictlyIdeas.no  Fact Sheet for Healthcare Providers: BankingDealers.co.za  This test is not yet approved or  cleared by the [place] FDA and has been authorized for detection and/or diagnosis of SARS-CoV-2 by FDA under an Emergency Use Authorization (EUA).  This EUA will remain in effect (meaning this test can be used) for the duration of the COVID-19 declaration under Section 564(b)(1) of the Act, 21 U.S.C. section 360bbb-3(b)(1), unless the authorization is terminated or revoked sooner.  Performed at [facility], [place] [address] [place], [place] [address]   SARS Coronavirus 2 by RT PCR (  hospital order, performed in [facility] hospital lab) Nasopharyngeal Nasopharyngeal Swab     Status: None   Collection Time: 06/12/20  2:31 PM   Specimen: Nasopharyngeal Swab  Result Value Ref Range Status   SARS Coronavirus 2 NEGATIVE NEGATIVE Final    Comment: (NOTE) SARS-CoV-2 target nucleic acids are NOT DETECTED.  The SARS-CoV-2 RNA is generally detectable in upper and lower respiratory specimens during the acute phase of infection. The lowest concentration of SARS-CoV-2 viral copies this assay can detect is 250 copies / mL. A negative result does not preclude SARS-CoV-2 infection and should not be used as the sole basis for treatment or other patient management decisions.  A negative result may occur with improper specimen collection / handling, submission of specimen other than nasopharyngeal swab, presence of viral mutation(s) within the areas targeted by this assay, and inadequate number of viral copies (<250 copies / mL). A negative result must be combined with clinical observations, patient history, and epidemiological information.  Fact Sheet for Patients:   StrictlyIdeas.no  Fact Sheet for Healthcare  Providers: BankingDealers.co.za  This test is not yet approved or  cleared by the [place] FDA and has been authorized for detection and/or diagnosis of SARS-CoV-2 by FDA under an Emergency Use Authorization (EUA).  This EUA will remain in effect (meaning this test can be used) for the duration of the COVID-19 declaration under Section 564(b)(1) of the Act, 21 U.S.C. section 360bbb-3(b)(1), unless the authorization is terminated or revoked sooner.  Performed at [facility], [place] [address] [place], [place] [address]    Time coordinating discharge: 35 minutes  SIGNED:  Kerney Elbe, DO [facility] 06/13/2020, 11:10 AM Pager is on [place]  If 7PM-7AM, please contact night-coverage [email or web address]

## 2020-06-13 NOTE — Progress Notes (Signed)
Report given to RN at [facility]

## 2020-06-13 NOTE — Progress Notes (Signed)
Occupational Therapy Treatment Patient Details Name: Valerie Young MRN: [id number] DOB: 07/24/1929 Today's Date: 06/13/2020    History of present illness 86 y.o. female with medical history significant of hypertension, hyperlipidemia, chronic kidney disease stage III with baseline creatinine 1.4, hypothyroidism, history of lung adenocarcinoma who arrives to ED on 7/13 with confusion, n/v. MRI head revelas multiple ischemic CVAs involving L PCA and R MLF pathway; brain regions including L hippocampus, L occipital lobe, L dorsal thalamus, bilateral cerebellar, and R midbrain.   OT comments  Pt continues to progress toward established OT goals. Pt received sitting in recliner eating lunch. She required increased time and effort to manipulate utensils to self-feed. Pt required modA to stand from recliner. She continues to demonstrate instability and BLE weakness, pt engaged in weight shifting while standing in preparation for further mobility. Pt will continue to benefit from skilled OT services to maximize safety and independence with ADL/IADL and functional mobility. Will continue to follow acutely and progress as tolerated.    Follow Up Recommendations  SNF;Supervision/Assistance - 24 hour    Equipment Recommendations  3 in 1 bedside commode    Recommendations for Other Services      Precautions / Restrictions Precautions Precautions: Fall Precaution Comments: R inattention, visual deficits. R INO much improved Restrictions Weight Bearing Restrictions: No       Mobility Bed Mobility Overal bed mobility: Needs Assistance Bed Mobility: Supine to Sit     Supine to sit: Min assist     General bed mobility comments: sitting in recliner upon arrival  Transfers Overall transfer level: Needs assistance Equipment used: Rolling walker (2 wheeled) Transfers: Sit to/from Stand Sit to Stand: Mod assist         General transfer comment: modA to powerup into standing, multimodal cues  for safe hand placement sit<>stand x4 first attempt with RW, following with face to face to work on weight shifting    Balance Overall balance assessment: Needs assistance Sitting-balance support: No upper extremity supported;Feet supported Sitting balance-Leahy Scale: Fair Sitting balance - Comments: able to sit briefly with hands in lap before fatiguing Postural control: Left lateral lean Standing balance support: Bilateral upper extremity supported;During functional activity Standing balance-Leahy Scale: Poor Standing balance comment: reliant on external support;left lateral lean after 3 min standing                           ADL either performed or assessed with clinical judgement   ADL Overall ADL's : Needs assistance/impaired Eating/Feeding: Set up;Sitting Eating/Feeding Details (indicate cue type and reason): eating lunch in recliner with family present upon arrival, pt requiring increased time and effort to utilize and manipulate utensils                 Lower Body Dressing: Maximal assistance;Sit to/from stand Lower Body Dressing Details (indicate cue type and reason): assist to don/doff LB clothing Toilet Transfer: Maximal assistance Toilet Transfer Details (indicate cue type and reason): sit<>stand from recliner with practicing weight shifting with bilateral knee blocking in preparation for further mobility Toileting- Clothing Manipulation and Hygiene: Maximal assistance;Sit to/from stand Toileting - Clothing Manipulation Details (indicate cue type and reason): incontinent of urine, purewik connected     Functional mobility during ADLs: Maximal assistance;Rolling walker General ADL Comments: pt with decreased activity tolerance, weakness requiring therapist to block bilateral knees, attempted to take 3 steps in place, no knee buckle noted;pt and therapist face to face to engage in weight  shifting and attending to right side of environment during standing and  engage in fully upright posture while standing     Vision   Vision Assessment?: Vision impaired- to be further tested in functional context Additional Comments: continue to assess, pt with decreased attention to task, unable to fully assess   Perception     Praxis      Cognition Arousal/Alertness: Awake/alert Behavior During Therapy: WFL for tasks assessed/performed Overall Cognitive Status: Impaired/Different from baseline Area of Impairment: Attention;Memory;Following commands;Safety/judgement;Problem solving;Awareness                   Current Attention Level: Sustained Memory: Decreased short-term memory Following Commands: Follows one step commands with increased time;Follows one step commands inconsistently Safety/Judgement: Decreased awareness of deficits;Decreased awareness of safety Awareness: Emergent Problem Solving: Slow processing;Decreased initiation;Difficulty sequencing;Requires verbal cues;Requires tactile cues General Comments: pt required increased time for processing information, easily distracted by therapist's shoes requiring cues for redirection, required multimodal cues to attend to R side during mobility        Exercises General Exercises - Lower Extremity Ankle Circles/Pumps: Both;10 reps;AAROM;Seated Quad Sets: AAROM;Both;10 reps;Seated Hip ABduction/ADduction: AAROM;Both;10 reps;Seated   Shoulder Instructions       General Comments      Pertinent Vitals/ Pain       Pain Assessment: No/denies pain Faces Pain Scale: No hurt Pain Intervention(s): Monitored during session  Home Living                                          Prior Functioning/Environment              Frequency  Min 2X/week        Progress Toward Goals  OT Goals(current goals can now be found in the care plan section)  Progress towards OT goals: Progressing toward goals  Acute Rehab OT Goals Patient Stated Goal: none explicitly  stated OT Goal Formulation: Patient unable to participate in goal setting Time For Goal Achievement: 06/23/20 Potential to Achieve Goals: Good ADL Goals Pt Will Perform Eating: with set-up;sitting Pt Will Perform Grooming: with supervision;sitting Pt Will Perform Upper Body Bathing: with min guard assist;sitting Pt Will Perform Lower Body Bathing: with mod assist;sitting/lateral leans;sit to/from stand Pt Will Transfer to Toilet: with mod assist;stand pivot transfer;bedside commode  Plan Discharge plan remains appropriate    Co-evaluation                 AM-PAC OT "6 Clicks" Daily Activity     Outcome Measure   Help from another person eating meals?: A Little Help from another person taking care of personal grooming?: A Little Help from another person toileting, which includes using toliet, bedpan, or urinal?: Total Help from another person bathing (including washing, rinsing, drying)?: A Lot Help from another person to put on and taking off regular upper body clothing?: A Lot Help from another person to put on and taking off regular lower body clothing?: A Lot 6 Click Score: 13    End of Session Equipment Utilized During Treatment: Gait belt;Rolling walker  OT Visit Diagnosis: Unsteadiness on feet (R26.81);Other abnormalities of gait and mobility (R26.89);Muscle weakness (generalized) (M62.81);Ataxia, unspecified (R27.0);Apraxia (R48.2);Other symptoms and signs involving cognitive function   Activity Tolerance Patient tolerated treatment well   Patient Left in chair;with call bell/phone within reach;with chair alarm set;with family/visitor present   Nurse Communication Mobility status  Time: 9447-3958 OT Time Calculation (min): 24 min  Charges: OT General Charges $OT Visit: 1 Visit OT Treatments $Self Care/Home Management : 8-22 mins $Therapeutic Activity: 8-22 mins  Helene Kelp OTR/L Acute Rehabilitation Services Office: [place] 06/13/2020, 12:44 PM

## 2020-06-13 NOTE — TOC Transition Note (Signed)
Transition of Care [facility]) - CM/SW Discharge Note   Patient Details  Name: Valerie Young MRN: [id number] Date of Birth: 10-06-1929  Transition of [facility]) CM/SW Contact:  Benard Halsted, LCSW Phone Number: 06/13/2020, 12:38 PM   Clinical Narrative:    Patient will DC to: [place] Anticipated DC date: 06/13/20 Family notified: Daughter April (she is letting Terri know) Transport by: Corey Harold   Per MD patient ready for DC to [facility]. RN, patient, patient's family, and facility notified of DC. Discharge Summary and FL2 sent to facility. RN to call report prior to discharge [phone] Room 507P). DC packet on chart. Ambulance transport requested for patient.   CSW will sign off for now as social work intervention is no longer needed. Please consult [place] again if new needs arise.      Final next level of care: Skilled Nursing Facility Barriers to Discharge: No Barriers Identified   Patient Goals and CMS Choice Patient states their goals for this hospitalization and ongoing recovery are:: To go back home to my apt. CMS Medicare.gov Compare Post Acute Care list provided to:: Patient Represenative (must comment) Benjaman Pott) Choice offered to / list presented to : Adult Children  Discharge Placement              Patient chooses bed at: [facility] Patient to be transferred to facility by: [place] Name of family member notified: Benjaman Pott Patient and family notified of of transfer: 06/13/20  Discharge Plan and Services                                     Social Determinants of Health (SDOH) Interventions     Readmission Risk Interventions No flowsheet data found.

## 2020-06-13 NOTE — Progress Notes (Signed)
Physical Therapy Treatment Patient Details Name: Valerie Young MRN: [id number] DOB: Aug 25, 1929 Today's Date: 06/13/2020    History of Present Illness 86 y.o. female with medical history significant of hypertension, hyperlipidemia, chronic kidney disease stage III with baseline creatinine 1.4, hypothyroidism, history of lung adenocarcinoma who arrives to ED on 7/13 with confusion, n/v. MRI head revelas multiple ischemic CVAs involving L PCA and R MLF pathway; brain regions including L hippocampus, L occipital lobe, L dorsal thalamus, bilateral cerebellar, and R midbrain.    PT Comments    Pt with improved alertness this day vs last visit, pt oriented to self and hospital upon arrival to room. Pt with improving awareness, becoming tearful during mobility stating "I didn't know I couldn't walk!". Pt tolerated 2x5 ft ambulation with RW and mod assist for steadying and guiding pt/RW. Pt with L lateral leaning in both sitting and standing, difficult to correct with cuing. Pt tolerated LE exercises in recliner, limited by fatigue. PT to continue to follow acutely, continuing to recommend SNF level of care post-acutely.   Follow Up Recommendations  SNF;Supervision/Assistance - 24 hour     Equipment Recommendations  None recommended by PT    Recommendations for Other Services       Precautions / Restrictions Precautions Precautions: Fall Precaution Comments: R inattention, visual deficits. R INO much improved    Mobility  Bed Mobility Overal bed mobility: Needs Assistance Bed Mobility: Supine to Sit     Supine to sit: Min assist     General bed mobility comments: Min assist for trunk elevation and LE lifting and translation to EOB. Verbal cuing for sequencing and letting go of rail, pt struggling with sequencing tasks.  Transfers Overall transfer level: Needs assistance Equipment used: Rolling walker (2 wheeled) Transfers: Sit to/from Stand Sit to Stand: Mod assist          General transfer comment: Mod assist for power up, steadying, safe hand placement when rising not followed. Stand x3, from EOB, chair, and recliner.  Ambulation/Gait Ambulation/Gait assistance: Mod assist Gait Distance (Feet): 5 Feet (2x5 ft) Assistive device: Rolling walker (2 wheeled) Gait Pattern/deviations: Step-through pattern;Decreased stride length;Trunk flexed;Narrow base of support;Staggering left Gait velocity: decr   General Gait Details: Mod assist to steady, guide pt and RW trajectory. Pt fatiguing after ~5 ft, NT in room brought chair behind pt for x2 minute seated rest break. Pt with L lateral leaning and L positioning in RW, requires multimodal cuing for placement within RW and centered inside.   Stairs             Wheelchair Mobility    Modified Rankin (Stroke Patients Only) Modified Rankin (Stroke Patients Only) Pre-Morbid Rankin Score: Slight disability Modified Rankin: Moderately severe disability     Balance Overall balance assessment: Needs assistance Sitting-balance support: No upper extremity supported;Feet supported Sitting balance-Leahy Scale: Fair Sitting balance - Comments: able to sit briefly with hands in lap before fatiguing Postural control: Left lateral lean Standing balance support: During functional activity;Single extremity supported Standing balance-Leahy Scale: Poor Standing balance comment: reliant on external support                            Cognition Arousal/Alertness: Awake/alert Behavior During Therapy: WFL for tasks assessed/performed Overall Cognitive Status: Impaired/Different from baseline Area of Impairment: Attention;Memory;Following commands;Safety/judgement;Problem solving;Awareness                   Current Attention Level: Sustained Memory:  Decreased short-term memory Following Commands: Follows one step commands with increased time;Follows one step commands inconsistently Safety/Judgement:  Decreased awareness of deficits;Decreased awareness of safety Awareness: Emergent Problem Solving: Slow processing;Decreased initiation;Difficulty sequencing;Requires verbal cues;Requires tactile cues General Comments: Pt alert and oriented to self and hospital, pt did not know she had CVA. Pt tearful when ambulating, stating "I didn't know I couldn't walk!!" Difficulty sequencing tasks safely      Exercises General Exercises - Lower Extremity Ankle Circles/Pumps: Both;10 reps;AAROM;Seated Quad Sets: AAROM;Both;10 reps;Seated Hip ABduction/ADduction: AAROM;Both;10 reps;Seated    General Comments        Pertinent Vitals/Pain Pain Assessment: Faces Faces Pain Scale: No hurt Pain Intervention(s): Monitored during session;Limited activity within patient's tolerance    Home Living                      Prior Function            PT Goals (current goals can now be found in the care plan section) Acute Rehab PT Goals Patient Stated Goal: none explicitly stated PT Goal Formulation: With patient/family Time For Goal Achievement: 06/22/20 Potential to Achieve Goals: Fair Progress towards PT goals: Progressing toward goals    Frequency    Min 3X/week      PT Plan Current plan remains appropriate    Co-evaluation              AM-PAC PT "6 Clicks" Mobility   Outcome Measure  Help needed turning from your back to your side while in a flat bed without using bedrails?: A Little Help needed moving from lying on your back to sitting on the side of a flat bed without using bedrails?: A Little Help needed moving to and from a bed to a chair (including a wheelchair)?: A Lot Help needed standing up from a chair using your arms (e.g., wheelchair or bedside chair)?: A Lot Help needed to walk in hospital room?: A Lot Help needed climbing 3-5 steps with a railing? : Total 6 Click Score: 13    End of Session   Activity Tolerance: Patient limited by fatigue Patient left:  with call bell/phone within reach;in chair;with chair alarm set;Other (comment) (MD in room, food in front of pt) Nurse Communication: Mobility status PT Visit Diagnosis: Other symptoms and signs involving the nervous system (Y09.983)     Time: 3825-0539 PT Time Calculation (min) (ACUTE ONLY): 28 min  Charges:  $Gait Training: 8-22 mins $Therapeutic Activity: 8-22 mins                    Marshae Azam E, PT [place] Pager [phone]  Office [phone]   Nell Schrack D Aishia Barkey 06/13/2020, 9:54 AM

## 2020-06-15 DIAGNOSIS — I6310 Cerebral infarction due to embolism of unspecified precerebral artery: Secondary | ICD-10-CM | POA: Diagnosis not present

## 2020-06-15 DIAGNOSIS — N390 Urinary tract infection, site not specified: Secondary | ICD-10-CM | POA: Diagnosis not present

## 2020-06-15 DIAGNOSIS — K529 Noninfective gastroenteritis and colitis, unspecified: Secondary | ICD-10-CM | POA: Diagnosis not present

## 2020-06-15 DIAGNOSIS — B9689 Other specified bacterial agents as the cause of diseases classified elsewhere: Secondary | ICD-10-CM | POA: Diagnosis not present

## 2020-06-20 DIAGNOSIS — N390 Urinary tract infection, site not specified: Secondary | ICD-10-CM | POA: Diagnosis not present

## 2020-06-20 DIAGNOSIS — R4189 Other symptoms and signs involving cognitive functions and awareness: Secondary | ICD-10-CM | POA: Diagnosis not present

## 2020-06-20 DIAGNOSIS — K5790 Diverticulosis of intestine, part unspecified, without perforation or abscess without bleeding: Secondary | ICD-10-CM | POA: Diagnosis not present

## 2020-06-20 DIAGNOSIS — L2089 Other atopic dermatitis: Secondary | ICD-10-CM | POA: Diagnosis not present

## 2020-06-20 DIAGNOSIS — K529 Noninfective gastroenteritis and colitis, unspecified: Secondary | ICD-10-CM | POA: Diagnosis not present

## 2020-06-20 DIAGNOSIS — I6310 Cerebral infarction due to embolism of unspecified precerebral artery: Secondary | ICD-10-CM | POA: Diagnosis not present

## 2020-06-20 DIAGNOSIS — I10 Essential (primary) hypertension: Secondary | ICD-10-CM | POA: Diagnosis not present

## 2020-06-27 ENCOUNTER — Other Ambulatory Visit: Payer: Self-pay

## 2020-06-27 ENCOUNTER — Ambulatory Visit (INDEPENDENT_AMBULATORY_CARE_PROVIDER_SITE_OTHER): Payer: Medicare Other | Admitting: Emergency Medicine

## 2020-06-27 DIAGNOSIS — I639 Cerebral infarction, unspecified: Secondary | ICD-10-CM

## 2020-06-27 LAB — CUP PACEART INCLINIC DEVICE CHECK
Date Time Interrogation Session: 20210803180732
Implantable Pulse Generator Implant Date: 20210719

## 2020-06-27 NOTE — Progress Notes (Signed)
ILR wound check in clinic. Steri strips removed. Wound well healed. Home monitor transmitting nightly. No symptom, tachy, or AF episodes. Pause and brady detection remain off per protocol, 3 previously recorded episodes occurred during implant (false). Patient educated about wound care and Carelink monitor. Monthly summary reports and ROV with Dr. Caryl Comes PRN.

## 2020-07-04 DIAGNOSIS — Z7189 Other specified counseling: Secondary | ICD-10-CM | POA: Diagnosis not present

## 2020-07-04 DIAGNOSIS — I10 Essential (primary) hypertension: Secondary | ICD-10-CM | POA: Diagnosis not present

## 2020-07-04 DIAGNOSIS — R05 Cough: Secondary | ICD-10-CM | POA: Diagnosis not present

## 2020-07-04 DIAGNOSIS — E038 Other specified hypothyroidism: Secondary | ICD-10-CM | POA: Diagnosis not present

## 2020-07-04 DIAGNOSIS — R4189 Other symptoms and signs involving cognitive functions and awareness: Secondary | ICD-10-CM | POA: Diagnosis not present

## 2020-07-04 DIAGNOSIS — E7849 Other hyperlipidemia: Secondary | ICD-10-CM | POA: Diagnosis not present

## 2020-07-04 DIAGNOSIS — I6310 Cerebral infarction due to embolism of unspecified precerebral artery: Secondary | ICD-10-CM | POA: Diagnosis not present

## 2020-07-04 DIAGNOSIS — N178 Other acute kidney failure: Secondary | ICD-10-CM | POA: Diagnosis not present

## 2020-07-06 DIAGNOSIS — N390 Urinary tract infection, site not specified: Secondary | ICD-10-CM | POA: Diagnosis not present

## 2020-07-06 DIAGNOSIS — Z7189 Other specified counseling: Secondary | ICD-10-CM | POA: Diagnosis not present

## 2020-07-06 DIAGNOSIS — J188 Other pneumonia, unspecified organism: Secondary | ICD-10-CM | POA: Diagnosis not present

## 2020-07-13 DIAGNOSIS — I10 Essential (primary) hypertension: Secondary | ICD-10-CM | POA: Diagnosis not present

## 2020-07-13 DIAGNOSIS — N390 Urinary tract infection, site not specified: Secondary | ICD-10-CM | POA: Diagnosis not present

## 2020-07-13 DIAGNOSIS — J188 Other pneumonia, unspecified organism: Secondary | ICD-10-CM | POA: Diagnosis not present

## 2020-07-13 DIAGNOSIS — R5381 Other malaise: Secondary | ICD-10-CM | POA: Diagnosis not present

## 2020-07-13 DIAGNOSIS — R4189 Other symptoms and signs involving cognitive functions and awareness: Secondary | ICD-10-CM | POA: Diagnosis not present

## 2020-07-25 DIAGNOSIS — H353131 Nonexudative age-related macular degeneration, bilateral, early dry stage: Secondary | ICD-10-CM | POA: Diagnosis not present

## 2020-07-25 DIAGNOSIS — Z961 Presence of intraocular lens: Secondary | ICD-10-CM | POA: Diagnosis not present

## 2020-07-25 DIAGNOSIS — I6609 Occlusion and stenosis of unspecified middle cerebral artery: Secondary | ICD-10-CM | POA: Diagnosis not present

## 2020-07-25 DIAGNOSIS — H401133 Primary open-angle glaucoma, bilateral, severe stage: Secondary | ICD-10-CM | POA: Diagnosis not present

## 2020-08-02 ENCOUNTER — Other Ambulatory Visit: Payer: Self-pay

## 2020-08-02 ENCOUNTER — Ambulatory Visit (INDEPENDENT_AMBULATORY_CARE_PROVIDER_SITE_OTHER): Payer: Medicare Other | Admitting: Emergency Medicine

## 2020-08-02 ENCOUNTER — Encounter: Payer: Self-pay | Admitting: Emergency Medicine

## 2020-08-02 DIAGNOSIS — R911 Solitary pulmonary nodule: Secondary | ICD-10-CM | POA: Diagnosis not present

## 2020-08-02 DIAGNOSIS — I639 Cerebral infarction, unspecified: Secondary | ICD-10-CM | POA: Diagnosis not present

## 2020-08-02 NOTE — Progress Notes (Signed)
Subjective:    Patient ID: Valerie Young, female    DOB: 03/31/1929, 86 y.o.   MRN: [id number]  HPI  ROV 05/18/18 --Valerie Young has a history of COPD, recurrent non-small cell lung cancer with a history of a right upper lobe lobectomy and more recently left upper lobe SBRT.  Valerie Young has subsequently had CT scans and PET scans that have shown hypermetabolic left lower lobe cavitary lesion.  Repeat CT was done on 04/29/2018 which I have reviewed.  This showed stable left paramediastinal radiation changes and a stable thin-walled cavitary lesion in the posterior left midlung.  A new 8 x 6 mm left lower lobe pulmonary nodule is present.  There is a stable right lower lobe groundglass opacity measuring 1.4 x 0.8 cm. Valerie Young is doing well, has good energy. Minimal exertional SOB. Valerie Young never uses albuterol.   ROV 11/12/18 --86 year old woman that is followed for COPD and recurrent non-small cell lung cancer.  Valerie Young had a right upper lobectomy in the past and more recently SBRT to the left upper lobe.  Most recently we have been following a left lower lobe cavitary lesion that is PET positive, a new left lower lobe nodule, stable right lower lobe groundglass opacity.  Valerie Young underwent a repeat CT on 10/29/2018 which I have reviewed.  This shows an interval increase in size in both the left basilar nodule (0.9 x 1.2 cm) and the right lower lobe ground glass opacity (2.4 x 1.0 cm).  The left lower lobe cavitary lesion is stable in size.  Valerie Young states that Valerie Young is doing well, not dyspneic unless Valerie Young bends over.   We discussed today whether Valerie Young would want rx if we were to establish a dx of lung CA > Valerie Young is unsure about this.   ROV 08/02/20 --Valerie Young is 86, has a history of COPD, recurrent non-small cell lung cancer for which GI had a right upper lobe lobectomy and left upper lobe SBRT.  We have followed a PET positive left lower lobe cavitary lesion and a new left lower lobe nodule, stable right lower lobe groundglass opacity  conservatively.  Valerie Young was admitted recently, 05/2020 for a posterior CVA. Valerie Young still has some imbalance and dizziness.  A CT-PA was done on 06/09/2020 which I reviewed, shows small increase in size of Valerie Young left lower lobe subpleural nodule to 1.4 x 1.0 cm.,  Medial right lower lobe subpleural consolidation increased in size compared with 2019 when it was groundglass,  New increase 5 mm subpleural nodule left lower lobe  MDM: Reviewed CT chest as above Reviewed hospitalization notes 7/14 - 7/19   Review of Systems As per HPI     Objective:   Physical Exam Vitals:   08/02/20 0953  BP: (!) 148/62  Pulse: 65  Temp: (!) 97.5 F (36.4 C)  TempSrc: Temporal  SpO2: 97%  Weight: 117 lb (53.1 kg)  Height: 5\' 4"  (1.626 m)   Gen: Pleasant, thin elderly, in no distress,  normal affect, in a wheelchair  ENT: No lesions,  mouth clear,  oropharynx clear, no postnasal drip  Neck: No JVD, no stridor  Lungs: No use of accessory muscles, clear without rales or rhonchi  Cardiovascular: RRR, heart sounds normal, no murmur or gallops, trace pretibial peripheral edema  Musculoskeletal: No deformities, no cyanosis or clubbing  Neuro: alert, non focal  Skin: Warm, no lesions or rashes      Assessment & Plan:  Pulmonary nodule History of lung cancer with possible slowly  evolving recurrence based on serial imaging.  Good discussion again today regarding Valerie Young goals.  Given Valerie Young age and other issues we have deferred biopsy, upfront therapy.  Valerie Young be open to evaluation and possible palliation if Valerie Young develops symptoms.  For now we will follow-up in 1 year, check Valerie Young functional capacity to determine whether continued surveillance is to Valerie Young benefit.  Valerie Young may be going to [place] to transition to assisted living in Valerie Young daughters.  If Valerie Young decides that Valerie Young like to follow-up there then we will help facilitate.  We reviewed your CT scan of the chest from 05/2020 today.  There has been some slow change in your  pulmonary nodules.  For now we have decided to follow conservatively.  We will plan to follow-up in July 2022 to discuss whether we should repeat your CT scan of the chest for surveillance.  If you develop any new problems, difficulty with breathing, cough, weight loss/fatigue, then call and we we will decide whether there may be benefit to imaging sooner. If it works best to transition your care to [place] then please let us know before next year and we will make all of your notes, records available.  Baltazar Apo, MD, PhD 08/02/2020, 10:36 AM [facility] [phone] or if no answer [phone]

## 2020-08-02 NOTE — Assessment & Plan Note (Signed)
History of lung cancer with possible slowly evolving recurrence based on serial imaging.  Good discussion again today regarding her goals.  Given her age and other issues we have deferred biopsy, upfront therapy.  She be open to evaluation and possible palliation if she develops symptoms.  For now we will follow-up in 1 year, check her functional capacity to determine whether continued surveillance is to her benefit.  She may be going to [place] to transition to assisted living in her daughters.  If she decides that she like to follow-up there then we will help facilitate.  We reviewed your CT scan of the chest from 05/2020 today.  There has been some slow change in your pulmonary nodules.  For now we have decided to follow conservatively.  We will plan to follow-up in July 2022 to discuss whether we should repeat your CT scan of the chest for surveillance.  If you develop any new problems, difficulty with breathing, cough, weight loss/fatigue, then call and we we will decide whether there may be benefit to imaging sooner. If it works best to transition your care to [place] then please let us know before next year and we will make all of your notes, records available.

## 2020-08-02 NOTE — Patient Instructions (Signed)
We reviewed your CT scan of the chest from 05/2020 today.  There has been some slow change in your pulmonary nodules.  For now we have decided to follow conservatively.  We will plan to follow-up in July 2022 to discuss whether we should repeat your CT scan of the chest for surveillance.  If you develop any new problems, difficulty with breathing, cough, weight loss/fatigue, then call and we we will decide whether there may be benefit to imaging sooner. If it works best to transition your care to [place] then please let us know before next year and we will make all of your notes, records available.

## 2020-08-03 ENCOUNTER — Ambulatory Visit (INDEPENDENT_AMBULATORY_CARE_PROVIDER_SITE_OTHER): Payer: Medicare Other | Admitting: Neurology

## 2020-08-03 ENCOUNTER — Encounter: Payer: Self-pay | Admitting: Neurology

## 2020-08-03 VITALS — BP 147/71 | HR 58 | Ht 64.0 in | Wt 117.0 lb

## 2020-08-03 DIAGNOSIS — H53461 Homonymous bilateral field defects, right side: Secondary | ICD-10-CM | POA: Diagnosis not present

## 2020-08-03 DIAGNOSIS — I639 Cerebral infarction, unspecified: Secondary | ICD-10-CM | POA: Diagnosis not present

## 2020-08-03 DIAGNOSIS — R260 Ataxic gait: Secondary | ICD-10-CM | POA: Diagnosis not present

## 2020-08-03 DIAGNOSIS — I63432 Cerebral infarction due to embolism of left posterior cerebral artery: Secondary | ICD-10-CM

## 2020-08-03 NOTE — Patient Instructions (Signed)
I had a long d/w patient and her daughter about her recent embolic strokes,residual right sided vision and gait imbalance, risk for recurrent stroke/TIAs, personally independently reviewed imaging studies and stroke evaluation results and answered questions.Continue aspirin 81 mg daily  for secondary stroke prevention and maintain strict control of hypertension with blood pressure goal below 130/90, diabetes with hemoglobin A1c goal below 6.5% and lipids with LDL cholesterol goal below 70 mg/dL. I also advised the patient to eat a healthy diet with plenty of whole grains, cereals, fruits and vegetables, exercise regularly and maintain ideal body weight .she was advised to continue ongoing physical occupational therapy and use a walker at all times and we discussed fall safety precautions.  She was advised not to drive due to persistent peripheral vision loss.  She may need to consider moving into assisted living or living with family if her neurological deficits persist.  Followup in the future with my nurse practitioner Janett Billow in 3 months or call earlier if necessary..  Stroke Prevention Some medical conditions and behaviors are associated with a higher chance of having a stroke. You can help prevent a stroke by making nutrition, lifestyle, and other changes, including managing any medical conditions you may have. What nutrition changes can be made?   Eat healthy foods. You can do this by: ? Choosing foods high in fiber, such as fresh fruits and vegetables and whole grains. ? Eating at least 5 or more servings of fruits and vegetables a day. Try to fill half of your plate at each meal with fruits and vegetables. ? Choosing lean protein foods, such as lean cuts of meat, poultry without skin, fish, tofu, beans, and nuts. ? Eating low-fat dairy products. ? Avoiding foods that are high in salt (sodium). This can help lower blood pressure. ? Avoiding foods that have saturated fat, trans fat, and  cholesterol. This can help prevent high cholesterol. ? Avoiding processed and premade foods.  Follow your health care provider's specific guidelines for losing weight, controlling high blood pressure (hypertension), lowering high cholesterol, and managing diabetes. These may include: ? Reducing your daily calorie intake. ? Limiting your daily sodium intake to 1,500 milligrams (mg). ? Using only healthy fats for cooking, such as olive oil, canola oil, or sunflower oil. ? Counting your daily carbohydrate intake. What lifestyle changes can be made?  Maintain a healthy weight. Talk to your health care provider about your ideal weight.  Get at least 30 minutes of moderate physical activity at least 5 days a week. Moderate activity includes brisk walking, biking, and swimming.  Do not use any products that contain nicotine or tobacco, such as cigarettes and e-cigarettes. If you need help quitting, ask your health care provider. It may also be helpful to avoid exposure to secondhand smoke.  Limit alcohol intake to no more than 1 drink a day for nonpregnant women and 2 drinks a day for men. One drink equals 12 oz of beer, 5 oz of wine, or 1 oz of hard liquor.  Stop any illegal drug use.  Avoid taking birth control pills. Talk to your health care provider about the risks of taking birth control pills if: ? You are over 24 years old. ? You smoke. ? You get migraines. ? You have ever had a blood clot. What other changes can be made?  Manage your cholesterol levels. ? Eating a healthy diet is important for preventing high cholesterol. If cholesterol cannot be managed through diet alone, you may also need to  take medicines. ? Take any prescribed medicines to control your cholesterol as told by your health care provider.  Manage your diabetes. ? Eating a healthy diet and exercising regularly are important parts of managing your blood sugar. If your blood sugar cannot be managed through diet and  exercise, you may need to take medicines. ? Take any prescribed medicines to control your diabetes as told by your health care provider.  Control your hypertension. ? To reduce your risk of stroke, try to keep your blood pressure below 130/80. ? Eating a healthy diet and exercising regularly are an important part of controlling your blood pressure. If your blood pressure cannot be managed through diet and exercise, you may need to take medicines. ? Take any prescribed medicines to control hypertension as told by your health care provider. ? Ask your health care provider if you should monitor your blood pressure at home. ? Have your blood pressure checked every year, even if your blood pressure is normal. Blood pressure increases with age and some medical conditions.  Get evaluated for sleep disorders (sleep apnea). Talk to your health care provider about getting a sleep evaluation if you snore a lot or have excessive sleepiness.  Take over-the-counter and prescription medicines only as told by your health care provider. Aspirin or blood thinners (antiplatelets or anticoagulants) may be recommended to reduce your risk of forming blood clots that can lead to stroke.  Make sure that any other medical conditions you have, such as atrial fibrillation or atherosclerosis, are managed. What are the warning signs of a stroke? The warning signs of a stroke can be easily remembered as BEFAST.  B is for balance. Signs include: ? Dizziness. ? Loss of balance or coordination. ? Sudden trouble walking.  E is for eyes. Signs include: ? A sudden change in vision. ? Trouble seeing.  F is for face. Signs include: ? Sudden weakness or numbness of the face. ? The face or eyelid drooping to one side.  A is for arms. Signs include: ? Sudden weakness or numbness of the arm, usually on one side of the body.  S is for speech. Signs include: ? Trouble speaking (aphasia). ? Trouble understanding.  T is for  time. ? These symptoms may represent a serious problem that is an emergency. Do not wait to see if the symptoms will go away. Get medical help right away. Call your local emergency services (911 in the [place] Do not drive yourself to the hospital.  Other signs of stroke may include: ? A sudden, severe headache with no known cause. ? Nausea or vomiting. ? Seizure. Where to find more information For more information, visit:  American Stroke Association: www.strokeassociation.org  National Stroke Association: www.stroke.org Summary  You can prevent a stroke by eating healthy, exercising, not smoking, limiting alcohol intake, and managing any medical conditions you may have.  Do not use any products that contain nicotine or tobacco, such as cigarettes and e-cigarettes. If you need help quitting, ask your health care provider. It may also be helpful to avoid exposure to secondhand smoke.  Remember BEFAST for warning signs of stroke. Get help right away if you or a loved one has any of these signs. This information is not intended to replace advice given to you by your health care provider. Make sure you discuss any questions you have with your health care provider. Document Revised: 10/24/2017 Document Reviewed: 12/17/2016 Elsevier Patient Education  2020 [employer].

## 2020-08-03 NOTE — Progress Notes (Signed)
[facility] [address] [place] [address] [phone]       OFFICE CONSULT NOTE  Valerie Young Date of Birth:  [birth year] Medical Record Number:  [id number]   Referring MD: Valerie Young  Reason for Referral: Stroke HPI: Valerie Young is a pleasant 86 year old Caucasian lady seen today for initial office consultation visit for stroke.  History is obtained from the patient and her daughter Valerie Young was accompanying her today as well as review of electronic medical records and I personally reviewed pertinent imaging films and PACS.  Valerie Young is a pleasant 86 year old lady with past medical history of hypertension, lung cancer, DVT, COPD, chronic kidney disease, degenerative joint disease, hypothyroidism who presented to [facility] on 06/06/2020 with vision difficulties with double vision and eye movement abnormalities.  Presented outside the window for TPA.  MRI scan shows multifocal infarcts involving left PCA, bilateral cerebellum and midbrain.  CT angiogram of the brain and neck did not reveal significant large vessel stenosis or occlusion.  Echocardiogram showed normal ejection fraction without cardiac source of embolism.  Telemetry monitoring did not show any cardiac arrhythmias.  She had loop recorder placed and so for paroxysmal A. fib has not yet been found.  LDL cholesterol was 70 mg percent and hemoglobin A1c was 5.9.  EEG was negative for seizures.  Lower extremity venous Dopplers were negative for DVT.  Patient was started on aspirin and transferred to rehab and she is staying there at present.  She is able to walk with a walker and is progressing steadily with physical occupational therapy.  Patient subjectively feels her peripheral vision is improved however on exam today she has dense right homonymous hemianopsia.  She is tolerating aspirin well without bruising or bleeding.  Blood pressures well controlled today it is 147/71.  She is tolerating  Pravachol well without muscle aches or side effects.  She subjectively feels her memory is fine but on my testing in the office today she recall was diminished to 0/3.  She used to live alone and wants to go back but the daughter understands that this may not be possible and she may need to move to assisted living or have more help at home.  ROS:   14 system review of systems is positive for vision loss, imbalance, gait difficulty, bruising and all other systems negative  PMH:  Past Medical History:  Diagnosis Date  . 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 Cancer ([place])    right lung ca - 30 years ago / left lung - 11/2012  . Cancer ([place])    Left lung  . CKD (chronic kidney disease)   . COPD (chronic obstructive pulmonary disease) ([place])   . DJD (degenerative joint disease)    knees  . H/O: lung cancer 1982   Right; treated with resection  . Hypertension   . Hypothyroidism   . PND (post-nasal drip)    Chronic    Social History:  Social History   Socioeconomic History  . Marital status: Divorced    Spouse name: Not on file  . Number of children: Not on file  . Years of education: Not on file  . Highest education level: Not on file  Occupational History  . Occupation: Retired  Tobacco Use  . Smoking status: Former Smoker    Packs/day: 1.00    Years: 35.00    Pack years: 35.00    Types: Cigarettes    Quit date: 11/25/1980  Years since quitting: 39.7  . Smokeless tobacco: Never Used  Vaping Use  . Vaping Use: Never used  Substance and Sexual Activity  . Alcohol use: No  . Drug use: No  . Sexual activity: Never    Birth control/protection: Surgical  Other Topics Concern  . Not on file  Social History Narrative   ** Merged History Encounter **       Social Determinants of Health   Financial Resource Strain:   . Difficulty of Paying Living Expenses: Not on file  Food Insecurity:   . Worried About Charity fundraiser in the Last Year: Not on file  .  Ran Out of Food in the Last Year: Not on file  Transportation Needs:   . Lack of Transportation (Medical): Not on file  . Lack of Transportation (Non-Medical): Not on file  Physical Activity:   . Days of Exercise per Week: Not on file  . Minutes of Exercise per Session: Not on file  Stress:   . Feeling of Stress : Not on file  Social Connections:   . Frequency of Communication with Friends and Family: Not on file  . Frequency of Social Gatherings with Friends and Family: Not on file  . Attends Religious Services: Not on file  . Active Member of Clubs or Organizations: Not on file  . Attends Archivist Meetings: Not on file  . Marital Status: Not on file  Intimate Partner Violence:   . Fear of Current or Ex-Partner: Not on file  . Emotionally Abused: Not on file  . Physically Abused: Not on file  . Sexually Abused: Not on file    Medications:   Current Outpatient Medications on File Prior to Visit  Medication Sig Dispense Refill  . acetaminophen (TYLENOL) 325 MG tablet Take 650 mg by mouth 3 (three) times daily. TWO TO THREE TIMES A DAY    . albuterol (PROVENTIL HFA;VENTOLIN HFA) 108 (90 Base) MCG/ACT inhaler Inhale 2 puffs into the lungs every 6 (six) hours as needed for wheezing or shortness of breath. 1 Inhaler 5  . amLODipine (NORVASC) 5 MG tablet TAKE 1 TABLET (5 MG TOTAL) BY MOUTH AT BEDTIME. (Patient taking differently: Take 5 mg by mouth daily. ) 90 tablet 0  . aspirin EC 81 MG tablet Take 1 tablet (81 mg total) by mouth daily. Swallow whole. 150 tablet 2  . atenolol (TENORMIN) 50 MG tablet Take 1 tablet (50 mg total) by mouth daily. 90 tablet 3  . B Complex Vitamins (B COMPLEX 100 PO) Take 1 tablet by mouth daily.    . cetirizine (ZYRTEC) 10 MG tablet TAKE 1 TABLET (10 MG TOTAL) BY MOUTH AT BEDTIME. (Patient taking differently: Take 10 mg by mouth daily. ) 30 tablet 2  . Cholecalciferol (VITAMIN D-3) 1000 units CAPS Take 3,000 Units by mouth daily.    [name] guaiFENesin  (MUCINEX) 600 MG 12 hr tablet Take 2 tablets (1,200 mg total) by mouth 2 (two) times daily. 20 tablet 0  . levothyroxine (SYNTHROID, LEVOTHROID) 50 MCG tablet Take 50 mcg by mouth daily before breakfast.   8  . polyethylene glycol (MIRALAX / GLYCOLAX) 17 g packet Take 17 g by mouth daily as needed for mild constipation. 14 each 0  . polyvinyl alcohol (LIQUIFILM TEARS) 1.4 % ophthalmic solution Place 1 drop into both eyes as needed for dry eyes.    . pravastatin (PRAVACHOL) 40 MG tablet Take 40 mg by mouth daily.  3   No  current facility-administered medications on file prior to visit.    Allergies:   Allergies  Allergen Reactions  . Sulfamethoxazole-Trimethoprim Rash    Physical Exam General: Frail elderly Caucasian lady, seated, in no evident distress Head: head normocephalic and atraumatic.   Neck: supple with no carotid or supraclavicular bruits Cardiovascular: regular rate and rhythm, no murmurs Musculoskeletal: no deformity Skin:  no rash/petichiae Vascular:  Normal pulses all extremities  Neurologic Exam Mental Status: Awake and fully alert. Oriented to place and time. Recent and remote memory poor.  Diminished recall 0/3.  Able to name only 6 animals which can walk on 4 legs.  Attention span, concentration and fund of knowledge appropriate. Mood and affect appropriate.  Cranial Nerves: Fundoscopic exam reveals sharp disc margins. Pupils equal, briskly reactive to light. Extraocular movements full without nystagmus. Visual fields show dense right homonymous hemianopsia to confrontation. Hearing diminished bilaterally. Facial sensation intact. Face, tongue, palate moves normally and symmetrically.  Motor: Normal bulk and tone. Normal strength in all tested extremity muscles. Sensory.: intact to touch , pinprick , position and vibratory sensation.  Coordination: Rapid alternating movements normal in all extremities. Finger-to-nose and heel-to-shin performed accurately  bilaterally. Gait and Station: Arises from chair with slight  difficulty. Stance is broad-based. Gait demonstrates ataxia with slight imbalance..  Not able to heel, toe and tandem walk  .  Reflexes: 1+ and symmetric. Toes downgoing.   NIHSS  3 Modified Rankin  3   ASSESSMENT: 86 year old Caucasian lady with multifocal left posterior cerebral artery, bilateral cerebellar and midbrain embolic infarcts of cryptogenic etiology in July 2021.  Given her memory impairment, significant peripheral vision loss and gait difficulties and living alone she may not be the best candidate for long-term anticoagulation hence will hold off on prolonged cardiac monitoring for A. fib.  Vascular risk factors of hypertension hyperlipidemia and age.     PLAN: I had a long d/w patient and her daughter about her recent embolic strokes,residual right sided vision and gait imbalance, risk for recurrent stroke/TIAs, personally independently reviewed imaging studies and stroke evaluation results and answered questions.Continue aspirin 81 mg daily  for secondary stroke prevention and maintain strict control of hypertension with blood pressure goal below 130/90, diabetes with hemoglobin A1c goal below 6.5% and lipids with LDL cholesterol goal below 70 mg/dL. I also advised the patient to eat a healthy diet with plenty of whole grains, cereals, fruits and vegetables, exercise regularly and maintain ideal body weight .she was advised to continue ongoing physical occupational therapy and use a walker at all times and we discussed fall safety precautions.  She was advised not to drive due to persistent peripheral vision loss.  She may need to consider moving into assisted living or living with family if her neurological deficits persist.  Greater than 50% time during this 45-minute consultation visit was spent on counseling and coordination of care about her embolic cryptogenic strokes and discussion about stroke prevention treatment and  answering questions.  Followup in the future with my nurse practitioner Janett Billow in 3 months or call earlier if necessary.Antony Contras, MD Note: This document was prepared with digital dictation and possible smart phrase technology. Any transcriptional errors that result from this process are unintentional.

## 2020-08-04 DIAGNOSIS — E038 Other specified hypothyroidism: Secondary | ICD-10-CM | POA: Diagnosis not present

## 2020-08-04 DIAGNOSIS — I674 Hypertensive encephalopathy: Secondary | ICD-10-CM | POA: Diagnosis not present

## 2020-08-04 DIAGNOSIS — N390 Urinary tract infection, site not specified: Secondary | ICD-10-CM | POA: Diagnosis not present

## 2020-08-04 DIAGNOSIS — R918 Other nonspecific abnormal finding of lung field: Secondary | ICD-10-CM | POA: Diagnosis not present

## 2020-08-04 DIAGNOSIS — Z85118 Personal history of other malignant neoplasm of bronchus and lung: Secondary | ICD-10-CM | POA: Diagnosis not present

## 2020-08-04 DIAGNOSIS — Z029 Encounter for administrative examinations, unspecified: Secondary | ICD-10-CM | POA: Diagnosis not present

## 2020-08-04 DIAGNOSIS — I6789 Other cerebrovascular disease: Secondary | ICD-10-CM | POA: Diagnosis not present

## 2020-08-04 DIAGNOSIS — Z8673 Personal history of transient ischemic attack (TIA), and cerebral infarction without residual deficits: Secondary | ICD-10-CM | POA: Diagnosis not present

## 2020-08-04 DIAGNOSIS — R1319 Other dysphagia: Secondary | ICD-10-CM | POA: Diagnosis not present

## 2020-08-09 DIAGNOSIS — N390 Urinary tract infection, site not specified: Secondary | ICD-10-CM | POA: Diagnosis not present

## 2020-08-09 DIAGNOSIS — R300 Dysuria: Secondary | ICD-10-CM | POA: Diagnosis not present

## 2020-08-15 DIAGNOSIS — E038 Other specified hypothyroidism: Secondary | ICD-10-CM | POA: Diagnosis not present

## 2020-08-15 DIAGNOSIS — E7849 Other hyperlipidemia: Secondary | ICD-10-CM | POA: Diagnosis not present

## 2020-08-15 DIAGNOSIS — N390 Urinary tract infection, site not specified: Secondary | ICD-10-CM | POA: Diagnosis not present

## 2020-08-15 DIAGNOSIS — I10 Essential (primary) hypertension: Secondary | ICD-10-CM | POA: Diagnosis not present

## 2020-08-15 DIAGNOSIS — Z8673 Personal history of transient ischemic attack (TIA), and cerebral infarction without residual deficits: Secondary | ICD-10-CM | POA: Diagnosis not present

## 2020-08-20 LAB — CUP PACEART REMOTE DEVICE CHECK
Date Time Interrogation Session: 20210924143635
Implantable Pulse Generator Implant Date: 20210719

## 2020-08-21 ENCOUNTER — Ambulatory Visit (INDEPENDENT_AMBULATORY_CARE_PROVIDER_SITE_OTHER): Payer: Medicare Other | Admitting: Emergency Medicine

## 2020-08-21 DIAGNOSIS — I63443 Cerebral infarction due to embolism of bilateral cerebellar arteries: Secondary | ICD-10-CM | POA: Diagnosis not present

## 2020-08-22 ENCOUNTER — Telehealth: Payer: Self-pay

## 2020-08-22 DIAGNOSIS — Z87440 Personal history of urinary (tract) infections: Secondary | ICD-10-CM | POA: Diagnosis not present

## 2020-08-22 DIAGNOSIS — S0180XD Unspecified open wound of other part of head, subsequent encounter: Secondary | ICD-10-CM | POA: Diagnosis not present

## 2020-08-22 DIAGNOSIS — I129 Hypertensive chronic kidney disease with stage 1 through stage 4 chronic kidney disease, or unspecified chronic kidney disease: Secondary | ICD-10-CM | POA: Diagnosis not present

## 2020-08-22 DIAGNOSIS — N1832 Chronic kidney disease, stage 3b: Secondary | ICD-10-CM | POA: Diagnosis not present

## 2020-08-22 DIAGNOSIS — Z7982 Long term (current) use of aspirin: Secondary | ICD-10-CM | POA: Diagnosis not present

## 2020-08-22 DIAGNOSIS — Z86711 Personal history of pulmonary embolism: Secondary | ICD-10-CM | POA: Diagnosis not present

## 2020-08-22 DIAGNOSIS — I69354 Hemiplegia and hemiparesis following cerebral infarction affecting left non-dominant side: Secondary | ICD-10-CM | POA: Diagnosis not present

## 2020-08-22 DIAGNOSIS — E785 Hyperlipidemia, unspecified: Secondary | ICD-10-CM | POA: Diagnosis not present

## 2020-08-22 DIAGNOSIS — Z9181 History of falling: Secondary | ICD-10-CM | POA: Diagnosis not present

## 2020-08-22 DIAGNOSIS — I69318 Other symptoms and signs involving cognitive functions following cerebral infarction: Secondary | ICD-10-CM | POA: Diagnosis not present

## 2020-08-22 DIAGNOSIS — Z96651 Presence of right artificial knee joint: Secondary | ICD-10-CM | POA: Diagnosis not present

## 2020-08-22 DIAGNOSIS — K5790 Diverticulosis of intestine, part unspecified, without perforation or abscess without bleeding: Secondary | ICD-10-CM | POA: Diagnosis not present

## 2020-08-22 DIAGNOSIS — I10 Essential (primary) hypertension: Secondary | ICD-10-CM | POA: Diagnosis not present

## 2020-08-22 DIAGNOSIS — E039 Hypothyroidism, unspecified: Secondary | ICD-10-CM | POA: Diagnosis not present

## 2020-08-22 DIAGNOSIS — Z86718 Personal history of other venous thrombosis and embolism: Secondary | ICD-10-CM | POA: Diagnosis not present

## 2020-08-22 DIAGNOSIS — Z85118 Personal history of other malignant neoplasm of bronchus and lung: Secondary | ICD-10-CM | POA: Diagnosis not present

## 2020-08-22 DIAGNOSIS — J449 Chronic obstructive pulmonary disease, unspecified: Secondary | ICD-10-CM | POA: Diagnosis not present

## 2020-08-22 NOTE — Telephone Encounter (Signed)
I called the pt's daughter and gave Dr. Guadelupe Sabin recommendation. She sts she is going to speak with her mom and try to get her to the ER/urgent care. She sts this may be difficultly given the pt's irritability and being argumentative as of late. She sts she will try her best.

## 2020-08-22 NOTE — Telephone Encounter (Signed)
Pt's daughter, Karna Christmas, left a VM with our office asking for a call back to discuss to discuss pt's anger issues post stroke.

## 2020-08-22 NOTE — Telephone Encounter (Signed)
I called the pt's daughter. She states since the pt was discharged from [facility] on 08/19/2020 she has been increasingly irritable, mean and argumentative. Daughter states the day of discharge she fell at [facility] and hit her head and the staff at [facility] wrapped gash to her head and told the pt it was her decision if she went to the ED for eval since she had been discharged from their facility. Pt is currently being placed in assisted living but does not have a move in date  Daughter wanted to know if the MD thought ED work up is warranted at this point and if any med recommendations could be made to help stabilize her mood? Dr. Leonie Man is not in the office this week (rounding in the hsp) will fwd to work in to review.

## 2020-08-22 NOTE — Telephone Encounter (Signed)
If the patient fell and hit her head I would recommend evaluation in the emergency room.  I will copy Dr. Leonie Man and Frann Rider, NP.

## 2020-08-23 DIAGNOSIS — I69354 Hemiplegia and hemiparesis following cerebral infarction affecting left non-dominant side: Secondary | ICD-10-CM | POA: Diagnosis not present

## 2020-08-23 DIAGNOSIS — N1832 Chronic kidney disease, stage 3b: Secondary | ICD-10-CM | POA: Diagnosis not present

## 2020-08-23 DIAGNOSIS — I129 Hypertensive chronic kidney disease with stage 1 through stage 4 chronic kidney disease, or unspecified chronic kidney disease: Secondary | ICD-10-CM | POA: Diagnosis not present

## 2020-08-23 DIAGNOSIS — S0180XD Unspecified open wound of other part of head, subsequent encounter: Secondary | ICD-10-CM | POA: Diagnosis not present

## 2020-08-23 DIAGNOSIS — J449 Chronic obstructive pulmonary disease, unspecified: Secondary | ICD-10-CM | POA: Diagnosis not present

## 2020-08-23 DIAGNOSIS — I69318 Other symptoms and signs involving cognitive functions following cerebral infarction: Secondary | ICD-10-CM | POA: Diagnosis not present

## 2020-08-23 NOTE — Progress Notes (Signed)
[facility] Summary Report / Loop Recorder 

## 2020-08-23 NOTE — Telephone Encounter (Signed)
Thanks agree with the above plan

## 2020-08-24 ENCOUNTER — Encounter (HOSPITAL_COMMUNITY): Payer: Self-pay | Admitting: Emergency Medicine

## 2020-08-24 ENCOUNTER — Emergency Department (HOSPITAL_COMMUNITY)
Admission: EM | Admit: 2020-08-24 | Discharge: 2020-08-24 | Disposition: A | Discharge status: Home / Self Care | Payer: Medicare Other | Attending: Emergency Medicine | Admitting: Emergency Medicine

## 2020-08-24 ENCOUNTER — Emergency Department (HOSPITAL_COMMUNITY): Payer: Medicare Other

## 2020-08-24 ENCOUNTER — Other Ambulatory Visit: Payer: Self-pay

## 2020-08-24 DIAGNOSIS — W01198A Fall on same level from slipping, tripping and stumbling with subsequent striking against other object, initial encounter: Secondary | ICD-10-CM | POA: Diagnosis not present

## 2020-08-24 DIAGNOSIS — Z96651 Presence of right artificial knee joint: Secondary | ICD-10-CM | POA: Insufficient documentation

## 2020-08-24 DIAGNOSIS — I639 Cerebral infarction, unspecified: Secondary | ICD-10-CM | POA: Diagnosis not present

## 2020-08-24 DIAGNOSIS — S0990XA Unspecified injury of head, initial encounter: Secondary | ICD-10-CM

## 2020-08-24 DIAGNOSIS — N184 Chronic kidney disease, stage 4 (severe): Secondary | ICD-10-CM | POA: Insufficient documentation

## 2020-08-24 DIAGNOSIS — J9819 Other pulmonary collapse: Secondary | ICD-10-CM | POA: Diagnosis not present

## 2020-08-24 DIAGNOSIS — Z79899 Other long term (current) drug therapy: Secondary | ICD-10-CM | POA: Diagnosis not present

## 2020-08-24 DIAGNOSIS — S199XXA Unspecified injury of neck, initial encounter: Secondary | ICD-10-CM | POA: Diagnosis not present

## 2020-08-24 DIAGNOSIS — S0101XA Laceration without foreign body of scalp, initial encounter: Secondary | ICD-10-CM | POA: Diagnosis not present

## 2020-08-24 DIAGNOSIS — I129 Hypertensive chronic kidney disease with stage 1 through stage 4 chronic kidney disease, or unspecified chronic kidney disease: Secondary | ICD-10-CM | POA: Insufficient documentation

## 2020-08-24 DIAGNOSIS — M2578 Osteophyte, vertebrae: Secondary | ICD-10-CM | POA: Diagnosis not present

## 2020-08-24 DIAGNOSIS — Z7982 Long term (current) use of aspirin: Secondary | ICD-10-CM | POA: Diagnosis not present

## 2020-08-24 DIAGNOSIS — E039 Hypothyroidism, unspecified: Secondary | ICD-10-CM | POA: Insufficient documentation

## 2020-08-24 DIAGNOSIS — M47812 Spondylosis without myelopathy or radiculopathy, cervical region: Secondary | ICD-10-CM | POA: Diagnosis not present

## 2020-08-24 DIAGNOSIS — J449 Chronic obstructive pulmonary disease, unspecified: Secondary | ICD-10-CM | POA: Diagnosis not present

## 2020-08-24 DIAGNOSIS — I69318 Other symptoms and signs involving cognitive functions following cerebral infarction: Secondary | ICD-10-CM | POA: Diagnosis not present

## 2020-08-24 DIAGNOSIS — G9389 Other specified disorders of brain: Secondary | ICD-10-CM | POA: Diagnosis not present

## 2020-08-24 DIAGNOSIS — Z87891 Personal history of nicotine dependence: Secondary | ICD-10-CM | POA: Insufficient documentation

## 2020-08-24 DIAGNOSIS — S0180XD Unspecified open wound of other part of head, subsequent encounter: Secondary | ICD-10-CM | POA: Diagnosis not present

## 2020-08-24 DIAGNOSIS — Y92129 Unspecified place in nursing home as the place of occurrence of the external cause: Secondary | ICD-10-CM | POA: Diagnosis not present

## 2020-08-24 DIAGNOSIS — G319 Degenerative disease of nervous system, unspecified: Secondary | ICD-10-CM | POA: Diagnosis not present

## 2020-08-24 DIAGNOSIS — I69354 Hemiplegia and hemiparesis following cerebral infarction affecting left non-dominant side: Secondary | ICD-10-CM | POA: Diagnosis not present

## 2020-08-24 DIAGNOSIS — N1832 Chronic kidney disease, stage 3b: Secondary | ICD-10-CM | POA: Diagnosis not present

## 2020-08-24 NOTE — ED Provider Notes (Signed)
[facility]ERGENCY DEPARTMENT Provider Note   CSN: [id number] Arrival date & time: 08/24/20  1438     History Chief Complaint  Patient presents with  . Fall    Malasia A Shanley is a 86 y.o. female.  HPI Patient is an 86 year old female with a past medical history of lung cancer, hypertension,, DVT and stroke.  Patient was just recently discharged over the weekend rehab facility after she had a stroke.  He states she has no residual deficits although she is walking with a walker since her stroke.  She has been having some issues with her memory and does have right-sided peripheral vision loss which is been consistent since her CVA.  Patient states that when she was discharged from rehabilitation facility on Saturday she like it with her walker from [place] and fell forward onto the wood floor where she states she hit her on the left side of her forehead.  Per daughter patient has been irritable since the stroke happened.  Daughter states that she told patient's neurologist informed him about the head injury and was told by nursing staff to go to the ER.  He is evaluated today.  Patient states she has had no headache, no new vision changes, no lightheadedness or dizziness.  No nausea no vomiting chest pain shortness of breath abdominal pain.  She has been wearing a bandage since the event occurred.  She states that her bleeding was controlled once the bandage was placed.  Patient is not on blood thinners.     Past Medical History:  Diagnosis Date  . 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 Cancer ([place])    right lung ca - 30 years ago / left lung - 11/2012  . Cancer ([place])    Left lung  . CKD (chronic kidney disease)   . COPD (chronic obstructive pulmonary disease) ([place])   . DJD (degenerative joint disease)    knees  . H/O: lung cancer 1982   Right; treated with resection  . Hypertension   . Hypothyroidism   . PND (post-nasal drip)    Chronic     Patient Active Problem List   Diagnosis Date Noted  . Cerebral embolism with cerebral infarction 06/08/2020  . Acute gastroenteritis 06/07/2020  . Gastroenteritis 06/07/2020  . Allergic rhinitis 10/21/2017  . Pneumothorax on left 04/09/2017  . Pleural effusion 04/09/2017  . Pneumothorax 04/09/2017  . 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 Neoplasm   . Essential hypertension   . Adenocarcinoma, lung ([place])   . Lung cancer ([place]) 10/02/2015  . Hypothyroidism 10/02/2015  . Arthritis of knee 01/06/2014  . Essential hypertension, benign 01/06/2014  . Encounter for therapeutic drug monitoring 12/17/2013  . Pulmonary emboli ([place]) 11/23/2013  . DVT (deep venous thrombosis) ([place]) 11/23/2013  . COPD (chronic obstructive pulmonary disease) with emphysema ([place]) 11/22/2013  . Acute respiratory failure with hypoxia ([place]) 11/22/2013  . Leukocytosis, unspecified 11/22/2013  . Anemia of chronic disease 11/22/2013  . CAP (community acquired pneumonia) 11/22/2013  . Chronic kidney disease (CKD), stage IV (severe) ([place]) 11/15/2013  . Adenocarcinoma of lung ([place]) 12/11/2012  . Pulmonary nodule 12/03/2012  . HYPOTHYROIDISM 06/19/2007  . HYPERTENSION 06/19/2007    Past Surgical History:  Procedure Laterality Date  . ABDOMINAL HYSTERECTOMY     PARTIAL  . CATARACT EXTRACTION, BILATERAL    . JOINT REPLACEMENT     RIGHT TOTAL KNEE  . LOOP RECORDER INSERTION N/A 06/12/2020   Procedure:  LOOP RECORDER INSERTION;  Surgeon: Deboraha Sprang, MD;  Location: [facility];  Service: Cardiovascular;  Laterality: N/A;  . RADIATION OF LEFT LUNG    . right lung cancer treated with resection  1982  . right thigh postoperative hematoma  02-2008  . RIGHT UPPER LOBE REMOVED    . THYROID SURGERY    . THYROIDECTOMY    . TOTAL KNEE ARTHROPLASTY     Right  . VESICOVAGINAL FISTULA CLOSURE W/ TAH    . VIDEO BRONCHOSCOPY  12/08/2012   Procedure: VIDEO BRONCHOSCOPY WITH FLUORO;  Surgeon: Kathee Delton, MD;  Location: [facility];  Service: Cardiopulmonary;  Laterality: Bilateral;     OB History    Gravida  0   Para  0   Term  0   Preterm  0   AB  0   Living        SAB  0   TAB  0   Ectopic  0   Multiple      Live Births              Family History  Problem Relation Age of Onset  . CVA Mother 40       hemorrhagic     Social History   Tobacco Use  . Smoking status: Former Smoker    Packs/day: 1.00    Years: 35.00    Pack years: 35.00    Types: Cigarettes    Quit date: 11/25/1980    Years since quitting: 39.7  . Smokeless tobacco: Never Used  Vaping Use  . Vaping Use: Never used  Substance Use Topics  . Alcohol use: No  . Drug use: No    Home Medications Prior to Admission medications   Medication Sig Start Date End Date Taking? Authorizing Provider  acetaminophen (TYLENOL) 325 MG tablet Take 650 mg by mouth 3 (three) times daily. TWO TO THREE TIMES A DAY    [provider]  albuterol (PROVENTIL HFA;VENTOLIN HFA) 108 (90 Base) MCG/ACT inhaler Inhale 2 puffs into the lungs every 6 (six) hours as needed for wheezing or shortness of breath. 10/15/18   Collene Gobble, MD  amLODipine (NORVASC) 5 MG tablet TAKE 1 TABLET (5 MG TOTAL) BY MOUTH AT BEDTIME. Patient taking differently: Take 5 mg by mouth daily.     Tresa Garter, MD  aspirin EC 81 MG tablet Take 1 tablet (81 mg total) by mouth daily. Swallow whole. 06/12/20 06/12/21  Raiford Noble Latif, DO  atenolol (TENORMIN) 50 MG tablet Take 1 tablet (50 mg total) by mouth daily. 01/06/14   Tresa Garter, MD  B Complex Vitamins (B COMPLEX 100 PO) Take 1 tablet by mouth daily.    [provider]  cetirizine (ZYRTEC) 10 MG tablet TAKE 1 TABLET (10 MG TOTAL) BY MOUTH AT BEDTIME. Patient taking differently: Take 10 mg by mouth daily.  11/06/15   Collene Gobble, MD  Cholecalciferol (VITAMIN D-3) 1000 units CAPS Take 3,000 Units by mouth daily.    [provider]   guaiFENesin (MUCINEX) 600 MG 12 hr tablet Take 2 tablets (1,200 mg total) by mouth 2 (two) times daily. 10/06/15   Hosie Poisson, MD  levothyroxine (SYNTHROID, LEVOTHROID) 50 MCG tablet Take 50 mcg by mouth daily before breakfast.  09/12/15   [provider]  polyethylene glycol (MIRALAX / GLYCOLAX) 17 g packet Take 17 g by mouth daily as needed for mild constipation. 06/12/20   Kerney Elbe,  DO  polyvinyl alcohol (LIQUIFILM TEARS) 1.4 % ophthalmic solution Place 1 drop into both eyes as needed for dry eyes.    [provider]  pravastatin (PRAVACHOL) 40 MG tablet Take 40 mg by mouth daily. 07/16/15   [provider]    Allergies    Sulfamethoxazole-trimethoprim  Review of Systems   Review of Systems  Constitutional: Negative for chills and fever.  HENT: Negative for congestion.   Eyes: Negative for pain.  Respiratory: Negative for cough and shortness of breath.   Cardiovascular: Negative for chest pain and leg swelling.  Gastrointestinal: Negative for abdominal pain and vomiting.  Genitourinary: Negative for dysuria.  Musculoskeletal: Negative for myalgias.  Skin: Negative for rash.       Head laceration  Neurological: Negative for dizziness and headaches.    Physical Exam Updated Vital Signs BP 138/62 (BP Location: Right Arm)   Pulse 65   Temp 98 F (36.7 C) (Oral)   Resp 20   Ht 5\' 4"  (1.626 m)   Wt 53.1 kg   SpO2 97%   BMI 20.09 kg/m   Physical Exam Vitals and nursing note reviewed.  Constitutional:      General: She is not in acute distress. HENT:     Head:     Comments: There is a small punctate laceration to the left forehead.    Nose: Nose normal.  Eyes:     General: No scleral icterus. Cardiovascular:     Rate and Rhythm: Normal rate and regular rhythm.     Pulses: Normal pulses.     Heart sounds: Normal heart sounds.  Pulmonary:     Effort: Pulmonary effort is normal. No respiratory distress.     Breath sounds: No  wheezing.  Abdominal:     Palpations: Abdomen is soft.     Tenderness: There is no abdominal tenderness.  Musculoskeletal:     Cervical back: Normal range of motion.     Right lower leg: No edema.     Left lower leg: No edema.     Comments: No bony tenderness over joints or long bones of the upper and lower extremities.     No neck or back midline tenderness, step-off, deformity, or bruising. Able to turn head left and right 45 degrees without difficulty.  Full range of motion of upper and lower extremity joints shown after palpation was conducted; with 5/5 symmetrical strength in upper and lower extremities. No chest wall tenderness, no facial or cranial tenderness.   Patient has intact sensation grossly in lower and upper extremities. Intact patellar and ankle reflexes. Patient able to ambulate without difficulty.  Radial and DP pulses palpated BL.   Skin:    General: Skin is warm and dry.     Capillary Refill: Capillary refill takes less than 2 seconds.  Neurological:     Mental Status: She is alert. Mental status is at baseline.     Comments: Alert and oriented to self, place, time and event.   Speech is fluent, clear without dysarthria or dysphasia.   Strength 5/5 in upper/lower extremities  Sensation intact in upper/lower extremities   Normal gait which required significant support. Negative Romberg. No pronator drift.  Normal finger-to-nose and feet tapping.  CN I not tested  CN II grossly intact visual fields bilaterally. Did not visualize posterior eye.   CN III, IV, VI PERRLA and EOMs intact bilaterally  CN V Intact sensation to sharp and light touch to the face  CN VII  facial movements symmetric  CN VIII not tested  CN IX, X no uvula deviation, symmetric rise of soft palate  CN XI 5/5 SCM and trapezius strength bilaterally  CN XII Midline tongue protrusion, symmetric L/R movements    Psychiatric:        Mood and Affect: Mood normal.        Behavior: Behavior  normal.     ED Results / Procedures / Treatments   Labs (all labs ordered are listed, but only abnormal results are displayed) Labs Reviewed - No data to display  EKG None  Radiology CT Head Wo Contrast  Result Date: 08/24/2020 CLINICAL DATA:  Status post trauma. EXAM: CT HEAD WITHOUT CONTRAST TECHNIQUE: Contiguous axial images were obtained from the base of the skull through the vertex without intravenous contrast. COMPARISON:  June 06, 2020 FINDINGS: Brain: There is moderate severity cerebral atrophy with widening of the extra-axial spaces and ventricular dilatation. There are areas of decreased attenuation within the white matter tracts of the supratentorial brain, consistent with microvascular disease changes. Small areas of cortical encephalomalacia, with adjacent chronic white matter low attenuation are seen within the right parietal and left occipital lobes. Vascular: No hyperdense vessel or unexpected calcification. Skull: Normal. Negative for fracture or focal lesion. Sinuses/Orbits: No acute finding. Other: None. IMPRESSION: 1. Moderate severity cerebral atrophy. 2. Small chronic right parietal and left occipital lobe infarcts. 3. No acute intracranial abnormality. Electronically Signed   By: Virgina Norfolk M.D.   On: 08/24/2020 19:29   CT Cervical Spine Wo Contrast  Result Date: 08/24/2020 CLINICAL DATA:  Status post trauma. EXAM: CT CERVICAL SPINE WITHOUT CONTRAST TECHNIQUE: Multidetector CT imaging of the cervical spine was performed without intravenous contrast. Multiplanar CT image reconstructions were also generated. COMPARISON:  Chest CTA, dated June 09, 2020. FINDINGS: Alignment: There is approximately 1 mm anterolisthesis of the C4 vertebral body on C5. Skull base and vertebrae: No acute fracture. Chronic, cystic appearing changes are seen involving the body of the dens. Soft tissues and spinal canal: No prevertebral fluid or swelling. No visible canal hematoma. Disc levels:  Moderate to marked severity endplate sclerosis is seen at the level of C5-C6 and C6-C7. Mild endplate sclerosis and anterior osteophyte formation is seen at the level of C7-T1. Moderate to marked severity intervertebral disc space narrowing is seen at the levels of C5-C6 and C6-C7. Moderate to marked severity bilateral multilevel facet joint hypertrophy is noted. Upper chest: Partial left upper lobe collapse is seen. This is present on the prior chest CT. Other: None. IMPRESSION: 1. No acute fracture within the cervical spine. 2. Moderate to marked severity degenerative changes at the levels of C5-C6 and C6-C7. 3. Partial left upper lobe collapse which is chronic in nature. Electronically Signed   By: Virgina Norfolk M.D.   On: 08/24/2020 19:38    Procedures Procedures (including critical care time)  Medications Ordered in ED Medications - No data to display  ED Course  I have reviewed the triage vital signs and the nursing notes.  Pertinent labs & imaging results that were available during my care of the patient were reviewed by me and considered in my medical decision making (see chart for details).  Patient is an 85 year old female with past medical history detailed above.  Presented today for she struck her head on the ground.  She has overall reassuring exam and history however she does 89 she is on blood thinners however will obtain CT imaging to rule out any intracranial hemorrhage.  Clinical Course as of Aug 25 1947  Thu Aug 24, 2020  1943 CT  IMPRESSION: 1. Moderate severity cerebral atrophy. 2. Small chronic right parietal and left occipital lobe infarcts. 3. No acute intracranial abnormality.  -----  1. No acute fracture within the cervical spine. 2. Moderate to marked severity degenerative changes at the levels of C5-C6 and C6-C7. 3. Partial left upper lobe collapse which is chronic in nature.    [WF]    Clinical Course User Index [WF] Tedd Sias, PA   MDM  Rules/Calculators/A&P                          Patient will follow up with PCP.  She is informed of all results.  Will discharge home.  Return precautions given.  Patient is at her baseline ability to walk. Is follow-up mechanical.  Final Clinical Impression(s) / ED Diagnoses Final diagnoses:  Injury of head, initial encounter  Laceration of scalp, initial encounter    Rx / DC Orders ED Discharge Orders    None       Tedd Sias, [place] 08/24/20 1949    Lacretia Leigh, MD 08/24/20 [phone]

## 2020-08-24 NOTE — ED Notes (Signed)
Assumed care of patient. Patient sitting in wheelchair in hallbed with daughter. NAD noted.

## 2020-08-24 NOTE — ED Provider Notes (Signed)
Medical screening examination/treatment/procedure(s) were conducted as a shared visit with non-physician practitioner(s) and myself.  I personally evaluated the patient during the encounter.    86 year old female here after a fall several days ago.  Had a laceration which is not repaired at this time.  No new neurological findings.  Will get head CT and likely discharge   Lacretia Leigh, MD 08/24/20 1759

## 2020-08-24 NOTE — Discharge Instructions (Signed)
Laceration of head/scalp Continue to gently clean the laceration site with antibacterial soap and warm water. Do not scrub the area. Do not soak the area and water for long periods of time. Don't use hydrogen peroxide, iodine-based solutions, or alcohol, which can slow healing, and will probably be painful! Apply topical bacitracin 1-2 times per day for the next 3-5 days. See your primary care doctor to follow up on your ER visit. You should return to ER  for any signs of infection which would include increased redness around the wound, increased swelling, new drainage of yellow pus.   Your head CT were without any fracture or intercranial hemorrhage.

## 2020-08-24 NOTE — ED Triage Notes (Signed)
Pt states she fell getting out of the hospital and hit her head. Pt stats she is not having any problems or pain she just wanted to be check.

## 2020-08-24 NOTE — ED Notes (Addendum)
Pt's daughter upset that pt was moved to the hallway. Family states they would rather go home at this time and sign out AMA. Spoke to family at bedside informed pending results from CT scans with possible discharge. Called CT and was told it would not be much longer before CT scans are resulted. Spoke to [employer] and moved pt to hallway 1 for privacy. PA Wylder informed. Report given to RN Janett Billow

## 2020-08-26 DIAGNOSIS — S0180XD Unspecified open wound of other part of head, subsequent encounter: Secondary | ICD-10-CM | POA: Diagnosis not present

## 2020-08-26 DIAGNOSIS — N1832 Chronic kidney disease, stage 3b: Secondary | ICD-10-CM | POA: Diagnosis not present

## 2020-08-26 DIAGNOSIS — I69318 Other symptoms and signs involving cognitive functions following cerebral infarction: Secondary | ICD-10-CM | POA: Diagnosis not present

## 2020-08-26 DIAGNOSIS — I129 Hypertensive chronic kidney disease with stage 1 through stage 4 chronic kidney disease, or unspecified chronic kidney disease: Secondary | ICD-10-CM | POA: Diagnosis not present

## 2020-08-26 DIAGNOSIS — I69354 Hemiplegia and hemiparesis following cerebral infarction affecting left non-dominant side: Secondary | ICD-10-CM | POA: Diagnosis not present

## 2020-08-26 DIAGNOSIS — J449 Chronic obstructive pulmonary disease, unspecified: Secondary | ICD-10-CM | POA: Diagnosis not present

## 2020-08-29 DIAGNOSIS — J449 Chronic obstructive pulmonary disease, unspecified: Secondary | ICD-10-CM | POA: Diagnosis not present

## 2020-08-29 DIAGNOSIS — I129 Hypertensive chronic kidney disease with stage 1 through stage 4 chronic kidney disease, or unspecified chronic kidney disease: Secondary | ICD-10-CM | POA: Diagnosis not present

## 2020-08-29 DIAGNOSIS — I69318 Other symptoms and signs involving cognitive functions following cerebral infarction: Secondary | ICD-10-CM | POA: Diagnosis not present

## 2020-08-29 DIAGNOSIS — I69354 Hemiplegia and hemiparesis following cerebral infarction affecting left non-dominant side: Secondary | ICD-10-CM | POA: Diagnosis not present

## 2020-08-29 DIAGNOSIS — S0180XD Unspecified open wound of other part of head, subsequent encounter: Secondary | ICD-10-CM | POA: Diagnosis not present

## 2020-08-29 DIAGNOSIS — N1832 Chronic kidney disease, stage 3b: Secondary | ICD-10-CM | POA: Diagnosis not present

## 2020-08-30 DIAGNOSIS — C3490 Malignant neoplasm of unspecified part of unspecified bronchus or lung: Secondary | ICD-10-CM | POA: Diagnosis not present

## 2020-08-30 DIAGNOSIS — G9341 Metabolic encephalopathy: Secondary | ICD-10-CM | POA: Diagnosis not present

## 2020-08-30 DIAGNOSIS — I639 Cerebral infarction, unspecified: Secondary | ICD-10-CM | POA: Diagnosis not present

## 2020-08-30 DIAGNOSIS — S0180XD Unspecified open wound of other part of head, subsequent encounter: Secondary | ICD-10-CM | POA: Diagnosis not present

## 2020-08-30 DIAGNOSIS — I129 Hypertensive chronic kidney disease with stage 1 through stage 4 chronic kidney disease, or unspecified chronic kidney disease: Secondary | ICD-10-CM | POA: Diagnosis not present

## 2020-08-30 DIAGNOSIS — D6869 Other thrombophilia: Secondary | ICD-10-CM | POA: Diagnosis not present

## 2020-08-30 DIAGNOSIS — E039 Hypothyroidism, unspecified: Secondary | ICD-10-CM | POA: Diagnosis not present

## 2020-08-30 DIAGNOSIS — J449 Chronic obstructive pulmonary disease, unspecified: Secondary | ICD-10-CM | POA: Diagnosis not present

## 2020-08-30 DIAGNOSIS — I69354 Hemiplegia and hemiparesis following cerebral infarction affecting left non-dominant side: Secondary | ICD-10-CM | POA: Diagnosis not present

## 2020-08-30 DIAGNOSIS — N3000 Acute cystitis without hematuria: Secondary | ICD-10-CM | POA: Diagnosis not present

## 2020-08-30 DIAGNOSIS — I10 Essential (primary) hypertension: Secondary | ICD-10-CM | POA: Diagnosis not present

## 2020-08-30 DIAGNOSIS — N1832 Chronic kidney disease, stage 3b: Secondary | ICD-10-CM | POA: Diagnosis not present

## 2020-08-30 DIAGNOSIS — R112 Nausea with vomiting, unspecified: Secondary | ICD-10-CM | POA: Diagnosis not present

## 2020-08-30 DIAGNOSIS — I69318 Other symptoms and signs involving cognitive functions following cerebral infarction: Secondary | ICD-10-CM | POA: Diagnosis not present

## 2020-08-30 DIAGNOSIS — R451 Restlessness and agitation: Secondary | ICD-10-CM | POA: Diagnosis not present

## 2020-08-31 DIAGNOSIS — N1832 Chronic kidney disease, stage 3b: Secondary | ICD-10-CM | POA: Diagnosis not present

## 2020-08-31 DIAGNOSIS — I69354 Hemiplegia and hemiparesis following cerebral infarction affecting left non-dominant side: Secondary | ICD-10-CM | POA: Diagnosis not present

## 2020-08-31 DIAGNOSIS — I129 Hypertensive chronic kidney disease with stage 1 through stage 4 chronic kidney disease, or unspecified chronic kidney disease: Secondary | ICD-10-CM | POA: Diagnosis not present

## 2020-08-31 DIAGNOSIS — S0180XD Unspecified open wound of other part of head, subsequent encounter: Secondary | ICD-10-CM | POA: Diagnosis not present

## 2020-08-31 DIAGNOSIS — J449 Chronic obstructive pulmonary disease, unspecified: Secondary | ICD-10-CM | POA: Diagnosis not present

## 2020-08-31 DIAGNOSIS — I69318 Other symptoms and signs involving cognitive functions following cerebral infarction: Secondary | ICD-10-CM | POA: Diagnosis not present

## 2020-09-04 DIAGNOSIS — I69318 Other symptoms and signs involving cognitive functions following cerebral infarction: Secondary | ICD-10-CM | POA: Diagnosis not present

## 2020-09-04 DIAGNOSIS — I129 Hypertensive chronic kidney disease with stage 1 through stage 4 chronic kidney disease, or unspecified chronic kidney disease: Secondary | ICD-10-CM | POA: Diagnosis not present

## 2020-09-04 DIAGNOSIS — I69354 Hemiplegia and hemiparesis following cerebral infarction affecting left non-dominant side: Secondary | ICD-10-CM | POA: Diagnosis not present

## 2020-09-04 DIAGNOSIS — J449 Chronic obstructive pulmonary disease, unspecified: Secondary | ICD-10-CM | POA: Diagnosis not present

## 2020-09-04 DIAGNOSIS — N1832 Chronic kidney disease, stage 3b: Secondary | ICD-10-CM | POA: Diagnosis not present

## 2020-09-04 DIAGNOSIS — S0180XD Unspecified open wound of other part of head, subsequent encounter: Secondary | ICD-10-CM | POA: Diagnosis not present

## 2020-09-05 DIAGNOSIS — J449 Chronic obstructive pulmonary disease, unspecified: Secondary | ICD-10-CM | POA: Diagnosis not present

## 2020-09-05 DIAGNOSIS — N1832 Chronic kidney disease, stage 3b: Secondary | ICD-10-CM | POA: Diagnosis not present

## 2020-09-05 DIAGNOSIS — I69354 Hemiplegia and hemiparesis following cerebral infarction affecting left non-dominant side: Secondary | ICD-10-CM | POA: Diagnosis not present

## 2020-09-05 DIAGNOSIS — S0180XD Unspecified open wound of other part of head, subsequent encounter: Secondary | ICD-10-CM | POA: Diagnosis not present

## 2020-09-05 DIAGNOSIS — I69318 Other symptoms and signs involving cognitive functions following cerebral infarction: Secondary | ICD-10-CM | POA: Diagnosis not present

## 2020-09-05 DIAGNOSIS — I129 Hypertensive chronic kidney disease with stage 1 through stage 4 chronic kidney disease, or unspecified chronic kidney disease: Secondary | ICD-10-CM | POA: Diagnosis not present

## 2020-09-06 DIAGNOSIS — N1832 Chronic kidney disease, stage 3b: Secondary | ICD-10-CM | POA: Diagnosis not present

## 2020-09-06 DIAGNOSIS — S0180XD Unspecified open wound of other part of head, subsequent encounter: Secondary | ICD-10-CM | POA: Diagnosis not present

## 2020-09-06 DIAGNOSIS — I69354 Hemiplegia and hemiparesis following cerebral infarction affecting left non-dominant side: Secondary | ICD-10-CM | POA: Diagnosis not present

## 2020-09-06 DIAGNOSIS — J449 Chronic obstructive pulmonary disease, unspecified: Secondary | ICD-10-CM | POA: Diagnosis not present

## 2020-09-06 DIAGNOSIS — I69318 Other symptoms and signs involving cognitive functions following cerebral infarction: Secondary | ICD-10-CM | POA: Diagnosis not present

## 2020-09-06 DIAGNOSIS — I129 Hypertensive chronic kidney disease with stage 1 through stage 4 chronic kidney disease, or unspecified chronic kidney disease: Secondary | ICD-10-CM | POA: Diagnosis not present

## 2020-09-11 DIAGNOSIS — I129 Hypertensive chronic kidney disease with stage 1 through stage 4 chronic kidney disease, or unspecified chronic kidney disease: Secondary | ICD-10-CM | POA: Diagnosis not present

## 2020-09-11 DIAGNOSIS — N1832 Chronic kidney disease, stage 3b: Secondary | ICD-10-CM | POA: Diagnosis not present

## 2020-09-11 DIAGNOSIS — I69354 Hemiplegia and hemiparesis following cerebral infarction affecting left non-dominant side: Secondary | ICD-10-CM | POA: Diagnosis not present

## 2020-09-11 DIAGNOSIS — S0180XD Unspecified open wound of other part of head, subsequent encounter: Secondary | ICD-10-CM | POA: Diagnosis not present

## 2020-09-11 DIAGNOSIS — I69318 Other symptoms and signs involving cognitive functions following cerebral infarction: Secondary | ICD-10-CM | POA: Diagnosis not present

## 2020-09-11 DIAGNOSIS — J449 Chronic obstructive pulmonary disease, unspecified: Secondary | ICD-10-CM | POA: Diagnosis not present

## 2020-09-12 DIAGNOSIS — I69354 Hemiplegia and hemiparesis following cerebral infarction affecting left non-dominant side: Secondary | ICD-10-CM | POA: Diagnosis not present

## 2020-09-12 DIAGNOSIS — J449 Chronic obstructive pulmonary disease, unspecified: Secondary | ICD-10-CM | POA: Diagnosis not present

## 2020-09-12 DIAGNOSIS — N1832 Chronic kidney disease, stage 3b: Secondary | ICD-10-CM | POA: Diagnosis not present

## 2020-09-12 DIAGNOSIS — I129 Hypertensive chronic kidney disease with stage 1 through stage 4 chronic kidney disease, or unspecified chronic kidney disease: Secondary | ICD-10-CM | POA: Diagnosis not present

## 2020-09-12 DIAGNOSIS — I69318 Other symptoms and signs involving cognitive functions following cerebral infarction: Secondary | ICD-10-CM | POA: Diagnosis not present

## 2020-09-12 DIAGNOSIS — S0180XD Unspecified open wound of other part of head, subsequent encounter: Secondary | ICD-10-CM | POA: Diagnosis not present

## 2020-09-13 DIAGNOSIS — I129 Hypertensive chronic kidney disease with stage 1 through stage 4 chronic kidney disease, or unspecified chronic kidney disease: Secondary | ICD-10-CM | POA: Diagnosis not present

## 2020-09-13 DIAGNOSIS — J449 Chronic obstructive pulmonary disease, unspecified: Secondary | ICD-10-CM | POA: Diagnosis not present

## 2020-09-13 DIAGNOSIS — S0180XD Unspecified open wound of other part of head, subsequent encounter: Secondary | ICD-10-CM | POA: Diagnosis not present

## 2020-09-13 DIAGNOSIS — I69354 Hemiplegia and hemiparesis following cerebral infarction affecting left non-dominant side: Secondary | ICD-10-CM | POA: Diagnosis not present

## 2020-09-13 DIAGNOSIS — N1832 Chronic kidney disease, stage 3b: Secondary | ICD-10-CM | POA: Diagnosis not present

## 2020-09-13 DIAGNOSIS — I69318 Other symptoms and signs involving cognitive functions following cerebral infarction: Secondary | ICD-10-CM | POA: Diagnosis not present

## 2020-09-18 DIAGNOSIS — J449 Chronic obstructive pulmonary disease, unspecified: Secondary | ICD-10-CM | POA: Diagnosis not present

## 2020-09-18 DIAGNOSIS — I69318 Other symptoms and signs involving cognitive functions following cerebral infarction: Secondary | ICD-10-CM | POA: Diagnosis not present

## 2020-09-18 DIAGNOSIS — I129 Hypertensive chronic kidney disease with stage 1 through stage 4 chronic kidney disease, or unspecified chronic kidney disease: Secondary | ICD-10-CM | POA: Diagnosis not present

## 2020-09-18 DIAGNOSIS — I69354 Hemiplegia and hemiparesis following cerebral infarction affecting left non-dominant side: Secondary | ICD-10-CM | POA: Diagnosis not present

## 2020-09-18 DIAGNOSIS — S0180XD Unspecified open wound of other part of head, subsequent encounter: Secondary | ICD-10-CM | POA: Diagnosis not present

## 2020-09-18 DIAGNOSIS — N1832 Chronic kidney disease, stage 3b: Secondary | ICD-10-CM | POA: Diagnosis not present

## 2020-09-19 DIAGNOSIS — J449 Chronic obstructive pulmonary disease, unspecified: Secondary | ICD-10-CM | POA: Diagnosis not present

## 2020-09-19 DIAGNOSIS — N1832 Chronic kidney disease, stage 3b: Secondary | ICD-10-CM | POA: Diagnosis not present

## 2020-09-19 DIAGNOSIS — I69318 Other symptoms and signs involving cognitive functions following cerebral infarction: Secondary | ICD-10-CM | POA: Diagnosis not present

## 2020-09-19 DIAGNOSIS — I69354 Hemiplegia and hemiparesis following cerebral infarction affecting left non-dominant side: Secondary | ICD-10-CM | POA: Diagnosis not present

## 2020-09-19 DIAGNOSIS — S0180XD Unspecified open wound of other part of head, subsequent encounter: Secondary | ICD-10-CM | POA: Diagnosis not present

## 2020-09-19 DIAGNOSIS — I129 Hypertensive chronic kidney disease with stage 1 through stage 4 chronic kidney disease, or unspecified chronic kidney disease: Secondary | ICD-10-CM | POA: Diagnosis not present

## 2020-09-20 DIAGNOSIS — I129 Hypertensive chronic kidney disease with stage 1 through stage 4 chronic kidney disease, or unspecified chronic kidney disease: Secondary | ICD-10-CM | POA: Diagnosis not present

## 2020-09-20 DIAGNOSIS — S0180XD Unspecified open wound of other part of head, subsequent encounter: Secondary | ICD-10-CM | POA: Diagnosis not present

## 2020-09-20 DIAGNOSIS — N1832 Chronic kidney disease, stage 3b: Secondary | ICD-10-CM | POA: Diagnosis not present

## 2020-09-20 DIAGNOSIS — I69354 Hemiplegia and hemiparesis following cerebral infarction affecting left non-dominant side: Secondary | ICD-10-CM | POA: Diagnosis not present

## 2020-09-20 DIAGNOSIS — I69318 Other symptoms and signs involving cognitive functions following cerebral infarction: Secondary | ICD-10-CM | POA: Diagnosis not present

## 2020-09-20 DIAGNOSIS — J449 Chronic obstructive pulmonary disease, unspecified: Secondary | ICD-10-CM | POA: Diagnosis not present

## 2020-09-21 DIAGNOSIS — Z87440 Personal history of urinary (tract) infections: Secondary | ICD-10-CM | POA: Diagnosis not present

## 2020-09-21 DIAGNOSIS — I129 Hypertensive chronic kidney disease with stage 1 through stage 4 chronic kidney disease, or unspecified chronic kidney disease: Secondary | ICD-10-CM | POA: Diagnosis not present

## 2020-09-21 DIAGNOSIS — Z7982 Long term (current) use of aspirin: Secondary | ICD-10-CM | POA: Diagnosis not present

## 2020-09-21 DIAGNOSIS — E785 Hyperlipidemia, unspecified: Secondary | ICD-10-CM | POA: Diagnosis not present

## 2020-09-21 DIAGNOSIS — Z85118 Personal history of other malignant neoplasm of bronchus and lung: Secondary | ICD-10-CM | POA: Diagnosis not present

## 2020-09-21 DIAGNOSIS — E039 Hypothyroidism, unspecified: Secondary | ICD-10-CM | POA: Diagnosis not present

## 2020-09-21 DIAGNOSIS — Z86718 Personal history of other venous thrombosis and embolism: Secondary | ICD-10-CM | POA: Diagnosis not present

## 2020-09-21 DIAGNOSIS — I69318 Other symptoms and signs involving cognitive functions following cerebral infarction: Secondary | ICD-10-CM | POA: Diagnosis not present

## 2020-09-21 DIAGNOSIS — K5790 Diverticulosis of intestine, part unspecified, without perforation or abscess without bleeding: Secondary | ICD-10-CM | POA: Diagnosis not present

## 2020-09-21 DIAGNOSIS — Z96651 Presence of right artificial knee joint: Secondary | ICD-10-CM | POA: Diagnosis not present

## 2020-09-21 DIAGNOSIS — Z9181 History of falling: Secondary | ICD-10-CM | POA: Diagnosis not present

## 2020-09-21 DIAGNOSIS — N1832 Chronic kidney disease, stage 3b: Secondary | ICD-10-CM | POA: Diagnosis not present

## 2020-09-21 DIAGNOSIS — S0180XD Unspecified open wound of other part of head, subsequent encounter: Secondary | ICD-10-CM | POA: Diagnosis not present

## 2020-09-21 DIAGNOSIS — I69354 Hemiplegia and hemiparesis following cerebral infarction affecting left non-dominant side: Secondary | ICD-10-CM | POA: Diagnosis not present

## 2020-09-21 DIAGNOSIS — Z86711 Personal history of pulmonary embolism: Secondary | ICD-10-CM | POA: Diagnosis not present

## 2020-09-21 DIAGNOSIS — J449 Chronic obstructive pulmonary disease, unspecified: Secondary | ICD-10-CM | POA: Diagnosis not present

## 2020-09-23 LAB — CUP PACEART REMOTE DEVICE CHECK
Date Time Interrogation Session: 20211027144228
Implantable Pulse Generator Implant Date: 20210719

## 2020-09-25 ENCOUNTER — Ambulatory Visit (INDEPENDENT_AMBULATORY_CARE_PROVIDER_SITE_OTHER): Payer: Medicare Other

## 2020-09-25 DIAGNOSIS — I639 Cerebral infarction, unspecified: Secondary | ICD-10-CM

## 2020-09-26 DIAGNOSIS — N1832 Chronic kidney disease, stage 3b: Secondary | ICD-10-CM | POA: Diagnosis not present

## 2020-09-26 DIAGNOSIS — I69354 Hemiplegia and hemiparesis following cerebral infarction affecting left non-dominant side: Secondary | ICD-10-CM | POA: Diagnosis not present

## 2020-09-26 DIAGNOSIS — J449 Chronic obstructive pulmonary disease, unspecified: Secondary | ICD-10-CM | POA: Diagnosis not present

## 2020-09-26 DIAGNOSIS — S0180XD Unspecified open wound of other part of head, subsequent encounter: Secondary | ICD-10-CM | POA: Diagnosis not present

## 2020-09-26 DIAGNOSIS — I69318 Other symptoms and signs involving cognitive functions following cerebral infarction: Secondary | ICD-10-CM | POA: Diagnosis not present

## 2020-09-26 DIAGNOSIS — I129 Hypertensive chronic kidney disease with stage 1 through stage 4 chronic kidney disease, or unspecified chronic kidney disease: Secondary | ICD-10-CM | POA: Diagnosis not present

## 2020-09-27 DIAGNOSIS — I69318 Other symptoms and signs involving cognitive functions following cerebral infarction: Secondary | ICD-10-CM | POA: Diagnosis not present

## 2020-09-27 DIAGNOSIS — S0180XD Unspecified open wound of other part of head, subsequent encounter: Secondary | ICD-10-CM | POA: Diagnosis not present

## 2020-09-27 DIAGNOSIS — I129 Hypertensive chronic kidney disease with stage 1 through stage 4 chronic kidney disease, or unspecified chronic kidney disease: Secondary | ICD-10-CM | POA: Diagnosis not present

## 2020-09-27 DIAGNOSIS — N1832 Chronic kidney disease, stage 3b: Secondary | ICD-10-CM | POA: Diagnosis not present

## 2020-09-27 DIAGNOSIS — J449 Chronic obstructive pulmonary disease, unspecified: Secondary | ICD-10-CM | POA: Diagnosis not present

## 2020-09-27 DIAGNOSIS — I69354 Hemiplegia and hemiparesis following cerebral infarction affecting left non-dominant side: Secondary | ICD-10-CM | POA: Diagnosis not present

## 2020-09-27 NOTE — Progress Notes (Signed)
[facility] Summary Report / Loop Recorder 

## 2020-09-28 DIAGNOSIS — N1832 Chronic kidney disease, stage 3b: Secondary | ICD-10-CM | POA: Diagnosis not present

## 2020-09-28 DIAGNOSIS — I129 Hypertensive chronic kidney disease with stage 1 through stage 4 chronic kidney disease, or unspecified chronic kidney disease: Secondary | ICD-10-CM | POA: Diagnosis not present

## 2020-09-28 DIAGNOSIS — I69354 Hemiplegia and hemiparesis following cerebral infarction affecting left non-dominant side: Secondary | ICD-10-CM | POA: Diagnosis not present

## 2020-09-28 DIAGNOSIS — S0180XD Unspecified open wound of other part of head, subsequent encounter: Secondary | ICD-10-CM | POA: Diagnosis not present

## 2020-09-28 DIAGNOSIS — J449 Chronic obstructive pulmonary disease, unspecified: Secondary | ICD-10-CM | POA: Diagnosis not present

## 2020-09-28 DIAGNOSIS — I69318 Other symptoms and signs involving cognitive functions following cerebral infarction: Secondary | ICD-10-CM | POA: Diagnosis not present

## 2020-10-02 DIAGNOSIS — S0180XD Unspecified open wound of other part of head, subsequent encounter: Secondary | ICD-10-CM | POA: Diagnosis not present

## 2020-10-02 DIAGNOSIS — J449 Chronic obstructive pulmonary disease, unspecified: Secondary | ICD-10-CM | POA: Diagnosis not present

## 2020-10-02 DIAGNOSIS — I129 Hypertensive chronic kidney disease with stage 1 through stage 4 chronic kidney disease, or unspecified chronic kidney disease: Secondary | ICD-10-CM | POA: Diagnosis not present

## 2020-10-02 DIAGNOSIS — N1832 Chronic kidney disease, stage 3b: Secondary | ICD-10-CM | POA: Diagnosis not present

## 2020-10-02 DIAGNOSIS — I69354 Hemiplegia and hemiparesis following cerebral infarction affecting left non-dominant side: Secondary | ICD-10-CM | POA: Diagnosis not present

## 2020-10-02 DIAGNOSIS — I69318 Other symptoms and signs involving cognitive functions following cerebral infarction: Secondary | ICD-10-CM | POA: Diagnosis not present

## 2020-10-03 DIAGNOSIS — I69318 Other symptoms and signs involving cognitive functions following cerebral infarction: Secondary | ICD-10-CM | POA: Diagnosis not present

## 2020-10-03 DIAGNOSIS — S0180XD Unspecified open wound of other part of head, subsequent encounter: Secondary | ICD-10-CM | POA: Diagnosis not present

## 2020-10-03 DIAGNOSIS — J449 Chronic obstructive pulmonary disease, unspecified: Secondary | ICD-10-CM | POA: Diagnosis not present

## 2020-10-03 DIAGNOSIS — N1832 Chronic kidney disease, stage 3b: Secondary | ICD-10-CM | POA: Diagnosis not present

## 2020-10-03 DIAGNOSIS — I129 Hypertensive chronic kidney disease with stage 1 through stage 4 chronic kidney disease, or unspecified chronic kidney disease: Secondary | ICD-10-CM | POA: Diagnosis not present

## 2020-10-03 DIAGNOSIS — I69354 Hemiplegia and hemiparesis following cerebral infarction affecting left non-dominant side: Secondary | ICD-10-CM | POA: Diagnosis not present

## 2020-10-05 DIAGNOSIS — S0180XD Unspecified open wound of other part of head, subsequent encounter: Secondary | ICD-10-CM | POA: Diagnosis not present

## 2020-10-05 DIAGNOSIS — J449 Chronic obstructive pulmonary disease, unspecified: Secondary | ICD-10-CM | POA: Diagnosis not present

## 2020-10-05 DIAGNOSIS — I69318 Other symptoms and signs involving cognitive functions following cerebral infarction: Secondary | ICD-10-CM | POA: Diagnosis not present

## 2020-10-05 DIAGNOSIS — I69354 Hemiplegia and hemiparesis following cerebral infarction affecting left non-dominant side: Secondary | ICD-10-CM | POA: Diagnosis not present

## 2020-10-05 DIAGNOSIS — N1832 Chronic kidney disease, stage 3b: Secondary | ICD-10-CM | POA: Diagnosis not present

## 2020-10-05 DIAGNOSIS — I129 Hypertensive chronic kidney disease with stage 1 through stage 4 chronic kidney disease, or unspecified chronic kidney disease: Secondary | ICD-10-CM | POA: Diagnosis not present

## 2020-10-09 DIAGNOSIS — I69354 Hemiplegia and hemiparesis following cerebral infarction affecting left non-dominant side: Secondary | ICD-10-CM | POA: Diagnosis not present

## 2020-10-09 DIAGNOSIS — J449 Chronic obstructive pulmonary disease, unspecified: Secondary | ICD-10-CM | POA: Diagnosis not present

## 2020-10-09 DIAGNOSIS — I69318 Other symptoms and signs involving cognitive functions following cerebral infarction: Secondary | ICD-10-CM | POA: Diagnosis not present

## 2020-10-09 DIAGNOSIS — I129 Hypertensive chronic kidney disease with stage 1 through stage 4 chronic kidney disease, or unspecified chronic kidney disease: Secondary | ICD-10-CM | POA: Diagnosis not present

## 2020-10-09 DIAGNOSIS — S0180XD Unspecified open wound of other part of head, subsequent encounter: Secondary | ICD-10-CM | POA: Diagnosis not present

## 2020-10-09 DIAGNOSIS — N1832 Chronic kidney disease, stage 3b: Secondary | ICD-10-CM | POA: Diagnosis not present

## 2020-10-10 DIAGNOSIS — N1832 Chronic kidney disease, stage 3b: Secondary | ICD-10-CM | POA: Diagnosis not present

## 2020-10-10 DIAGNOSIS — I129 Hypertensive chronic kidney disease with stage 1 through stage 4 chronic kidney disease, or unspecified chronic kidney disease: Secondary | ICD-10-CM | POA: Diagnosis not present

## 2020-10-10 DIAGNOSIS — S0180XD Unspecified open wound of other part of head, subsequent encounter: Secondary | ICD-10-CM | POA: Diagnosis not present

## 2020-10-10 DIAGNOSIS — J449 Chronic obstructive pulmonary disease, unspecified: Secondary | ICD-10-CM | POA: Diagnosis not present

## 2020-10-10 DIAGNOSIS — I69318 Other symptoms and signs involving cognitive functions following cerebral infarction: Secondary | ICD-10-CM | POA: Diagnosis not present

## 2020-10-10 DIAGNOSIS — I69354 Hemiplegia and hemiparesis following cerebral infarction affecting left non-dominant side: Secondary | ICD-10-CM | POA: Diagnosis not present

## 2020-10-12 DIAGNOSIS — I69318 Other symptoms and signs involving cognitive functions following cerebral infarction: Secondary | ICD-10-CM | POA: Diagnosis not present

## 2020-10-12 DIAGNOSIS — I69354 Hemiplegia and hemiparesis following cerebral infarction affecting left non-dominant side: Secondary | ICD-10-CM | POA: Diagnosis not present

## 2020-10-12 DIAGNOSIS — N1832 Chronic kidney disease, stage 3b: Secondary | ICD-10-CM | POA: Diagnosis not present

## 2020-10-12 DIAGNOSIS — I129 Hypertensive chronic kidney disease with stage 1 through stage 4 chronic kidney disease, or unspecified chronic kidney disease: Secondary | ICD-10-CM | POA: Diagnosis not present

## 2020-10-12 DIAGNOSIS — S0180XD Unspecified open wound of other part of head, subsequent encounter: Secondary | ICD-10-CM | POA: Diagnosis not present

## 2020-10-12 DIAGNOSIS — J449 Chronic obstructive pulmonary disease, unspecified: Secondary | ICD-10-CM | POA: Diagnosis not present

## 2020-10-16 DIAGNOSIS — I129 Hypertensive chronic kidney disease with stage 1 through stage 4 chronic kidney disease, or unspecified chronic kidney disease: Secondary | ICD-10-CM | POA: Diagnosis not present

## 2020-10-16 DIAGNOSIS — S0180XD Unspecified open wound of other part of head, subsequent encounter: Secondary | ICD-10-CM | POA: Diagnosis not present

## 2020-10-16 DIAGNOSIS — J449 Chronic obstructive pulmonary disease, unspecified: Secondary | ICD-10-CM | POA: Diagnosis not present

## 2020-10-16 DIAGNOSIS — I69318 Other symptoms and signs involving cognitive functions following cerebral infarction: Secondary | ICD-10-CM | POA: Diagnosis not present

## 2020-10-16 DIAGNOSIS — N1832 Chronic kidney disease, stage 3b: Secondary | ICD-10-CM | POA: Diagnosis not present

## 2020-10-16 DIAGNOSIS — I69354 Hemiplegia and hemiparesis following cerebral infarction affecting left non-dominant side: Secondary | ICD-10-CM | POA: Diagnosis not present

## 2020-10-18 DIAGNOSIS — J449 Chronic obstructive pulmonary disease, unspecified: Secondary | ICD-10-CM | POA: Diagnosis not present

## 2020-10-18 DIAGNOSIS — S0180XD Unspecified open wound of other part of head, subsequent encounter: Secondary | ICD-10-CM | POA: Diagnosis not present

## 2020-10-18 DIAGNOSIS — I69354 Hemiplegia and hemiparesis following cerebral infarction affecting left non-dominant side: Secondary | ICD-10-CM | POA: Diagnosis not present

## 2020-10-18 DIAGNOSIS — I69318 Other symptoms and signs involving cognitive functions following cerebral infarction: Secondary | ICD-10-CM | POA: Diagnosis not present

## 2020-10-18 DIAGNOSIS — I129 Hypertensive chronic kidney disease with stage 1 through stage 4 chronic kidney disease, or unspecified chronic kidney disease: Secondary | ICD-10-CM | POA: Diagnosis not present

## 2020-10-18 DIAGNOSIS — N1832 Chronic kidney disease, stage 3b: Secondary | ICD-10-CM | POA: Diagnosis not present

## 2020-10-25 ENCOUNTER — Ambulatory Visit (INDEPENDENT_AMBULATORY_CARE_PROVIDER_SITE_OTHER): Payer: Medicare Other

## 2020-10-25 DIAGNOSIS — I639 Cerebral infarction, unspecified: Secondary | ICD-10-CM

## 2020-10-25 LAB — CUP PACEART REMOTE DEVICE CHECK
Date Time Interrogation Session: 20211129144227
Implantable Pulse Generator Implant Date: 20210719

## 2020-10-31 NOTE — Progress Notes (Signed)
[facility] Summary Report / Loop Recorder 

## 2020-11-13 ENCOUNTER — Emergency Department (HOSPITAL_COMMUNITY)
Admission: EM | Admit: 2020-11-13 | Discharge: 2020-11-13 | Disposition: A | Discharge status: Home / Self Care | Payer: Medicare Other | Attending: Emergency Medicine | Admitting: Emergency Medicine

## 2020-11-13 ENCOUNTER — Encounter (HOSPITAL_COMMUNITY): Payer: Self-pay

## 2020-11-13 ENCOUNTER — Other Ambulatory Visit: Payer: Self-pay

## 2020-11-13 DIAGNOSIS — E039 Hypothyroidism, unspecified: Secondary | ICD-10-CM | POA: Insufficient documentation

## 2020-11-13 DIAGNOSIS — J449 Chronic obstructive pulmonary disease, unspecified: Secondary | ICD-10-CM | POA: Diagnosis not present

## 2020-11-13 DIAGNOSIS — B9620 Unspecified Escherichia coli [E. coli] as the cause of diseases classified elsewhere: Secondary | ICD-10-CM | POA: Insufficient documentation

## 2020-11-13 DIAGNOSIS — I129 Hypertensive chronic kidney disease with stage 1 through stage 4 chronic kidney disease, or unspecified chronic kidney disease: Secondary | ICD-10-CM | POA: Insufficient documentation

## 2020-11-13 DIAGNOSIS — Z85118 Personal history of other malignant neoplasm of bronchus and lung: Secondary | ICD-10-CM | POA: Diagnosis not present

## 2020-11-13 DIAGNOSIS — Z96651 Presence of right artificial knee joint: Secondary | ICD-10-CM | POA: Diagnosis not present

## 2020-11-13 DIAGNOSIS — N189 Chronic kidney disease, unspecified: Secondary | ICD-10-CM | POA: Diagnosis not present

## 2020-11-13 DIAGNOSIS — Z87891 Personal history of nicotine dependence: Secondary | ICD-10-CM | POA: Diagnosis not present

## 2020-11-13 DIAGNOSIS — R8290 Unspecified abnormal findings in urine: Secondary | ICD-10-CM | POA: Diagnosis not present

## 2020-11-13 DIAGNOSIS — R52 Pain, unspecified: Secondary | ICD-10-CM | POA: Diagnosis not present

## 2020-11-13 DIAGNOSIS — N3000 Acute cystitis without hematuria: Secondary | ICD-10-CM | POA: Diagnosis not present

## 2020-11-13 LAB — URINALYSIS, ROUTINE W REFLEX MICROSCOPIC
Bilirubin Urine: NEGATIVE
Glucose, UA: NEGATIVE mg/dL
Hgb urine dipstick: NEGATIVE
Ketones, ur: NEGATIVE mg/dL
Nitrite: POSITIVE — AB
Protein, ur: NEGATIVE mg/dL
Specific Gravity, Urine: 1.009 (ref 1.005–1.030)
pH: 6 (ref 5.0–8.0)

## 2020-11-13 MED ORDER — CEPHALEXIN 250 MG PO CAPS
250.0000 mg | ORAL_CAPSULE | Freq: Three times a day (TID) | ORAL | 0 refills | Status: AC
Start: 2020-11-13 — End: 2020-11-20

## 2020-11-13 NOTE — ED Provider Notes (Signed)
[facility] Emergency Department Provider Note MRN:  [id number]  Arrival date & time: 11/13/20     Chief Complaint   Urine Foul Odor    History of Present Illness   Valerie Young is a 86 y.o. year-old female with a history of COPD, CKD presenting to the ED with chief complaint of urine foul smelling.  Patient has been feeling well lately.  Woke up this morning and noted a foul smell to her urine.  Also noted some slight straining with urination but feels that she was able to empty her bladder.  She denies any abdominal pain, no flank or back pain, no fever, no chills, no chest pain or shortness of breath, no other complaints.  She was hospitalized with a stroke a while ago and developed a kidney infection and she just wants to make sure that she does not have a kidney infection.  Review of Systems  A complete 10 system review of systems was obtained and all systems are negative except as noted in the HPI and PMH.   Patient's Health History    Past Medical History:  Diagnosis Date  . 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 Cancer ([place])    right lung ca - 30 years ago / left lung - 11/2012  . Cancer ([place])    Left lung  . CKD (chronic kidney disease)   . COPD (chronic obstructive pulmonary disease) ([place])   . DJD (degenerative joint disease)    knees  . H/O: lung cancer 1982   Right; treated with resection  . Hypertension   . Hypothyroidism   . PND (post-nasal drip)    Chronic    Past Surgical History:  Procedure Laterality Date  . ABDOMINAL HYSTERECTOMY     PARTIAL  . CATARACT EXTRACTION, BILATERAL    . JOINT REPLACEMENT     RIGHT TOTAL KNEE  . LOOP RECORDER INSERTION N/A 06/12/2020   Procedure: LOOP RECORDER INSERTION;  Surgeon: Deboraha Sprang, MD;  Location: [facility];  Service: Cardiovascular;  Laterality: N/A;  . RADIATION OF LEFT LUNG    . right lung cancer treated with resection  1982  . right thigh postoperative  hematoma  02-2008  . RIGHT UPPER LOBE REMOVED    . THYROID SURGERY    . THYROIDECTOMY    . TOTAL KNEE ARTHROPLASTY     Right  . VESICOVAGINAL FISTULA CLOSURE W/ TAH    . VIDEO BRONCHOSCOPY  12/08/2012   Procedure: VIDEO BRONCHOSCOPY WITH FLUORO;  Surgeon: Kathee Delton, MD;  Location: [facility];  Service: Cardiopulmonary;  Laterality: Bilateral;    Family History  Problem Relation Age of Onset  . CVA Mother 17       hemorrhagic     Social History   Socioeconomic History  . Marital status: Divorced    Spouse name: Not on file  . Number of children: Not on file  . Years of education: Not on file  . Highest education level: Not on file  Occupational History  . Occupation: Retired  Tobacco Use  . Smoking status: Former Smoker    Packs/day: 1.00    Years: 35.00    Pack years: 35.00    Types: Cigarettes    Quit date: 11/25/1980    Years since quitting: 39.9  . Smokeless tobacco: Never Used  Vaping Use  . Vaping Use: Never used  Substance and Sexual Activity  . Alcohol use: No  . Drug  use: No  . Sexual activity: Never    Birth control/protection: Surgical  Other Topics Concern  . Not on file  Social History Narrative   ** Merged History Encounter **       Social Determinants of Health   Financial Resource Strain: Not on file  Food Insecurity: Not on file  Transportation Needs: Not on file  Physical Activity: Not on file  Stress: Not on file  Social Connections: Not on file  Intimate Partner Violence: Not on file     Physical Exam   Vitals:   11/13/20 0620 11/13/20 0646  BP: (!) 165/65 (!) 160/63  Pulse: 62 64  Resp: 17 16  Temp: 97.9 F (36.6 C)   SpO2: 98% 98%    CONSTITUTIONAL: Chronically ill-appearing, NAD NEURO:  Alert and oriented x 3, no focal deficits EYES:  eyes equal and reactive ENT/NECK:  no LAD, no JVD CARDIO: Regular rate, well-perfused, normal S1 and S2 PULM:  CTAB no wheezing or rhonchi GI/GU:  normal bowel sounds, non-distended,  non-tender MSK/SPINE:  No gross deformities, no edema SKIN:  no rash, atraumatic PSYCH:  Appropriate speech and behavior  *Additional and/or pertinent findings included in MDM below  Diagnostic and Interventional Summary    EKG Interpretation  Date/Time:    Ventricular Rate:    PR Interval:    QRS Duration:   QT Interval:    QTC Calculation:   R Axis:     Text Interpretation:        Labs Reviewed  URINE CULTURE  URINALYSIS, ROUTINE W REFLEX MICROSCOPIC    No orders to display    Medications - No data to display   Procedures  /  Critical Care Procedures  ED Course and Medical Decision Making  I have reviewed the triage vital signs, the nursing notes, and pertinent available records from the EMR.  Listed above are laboratory and imaging tests that I personally ordered, reviewed, and interpreted and then considered in my medical decision making (see below for details).  Foul-smelling odor with no other significant symptoms, normal vital signs, no fever, suspect UTI with no signs of systemic infection, no indication for labs or admission.  Awaiting urinalysis.  Signed out to oncoming provider at shift change.       Barth Kirks. Sedonia Small, [place] [email or web address]  Final Clinical Impressions(s) / ED Diagnoses     ICD-10-CM   1. Foul smelling urine  R82.90     ED Discharge Orders    None       Discharge Instructions Discussed with and Provided to Patient:   Discharge Instructions   None       Maudie Flakes, MD 11/13/20 [phone]

## 2020-11-13 NOTE — Discharge Instructions (Addendum)
Take the antibiotics as prescribed.  Follow-up with your doctor to make sure the infection resolves.  Return to the ED for fever vomiting or other concerning symptoms

## 2020-11-13 NOTE — ED Triage Notes (Signed)
Pt arrives via EMS from home with complaint of foul smelling urine that she noticed this morning upon waking. No back pain, pain with urination, or blood noted in the urine. Pt reports a hx of kidney infection associated with the smell.   EMS vitals: BP 160/70 (hx of hypertension) HR 68 RR 16 SPO2 99% RA CBG 137 T 99.4

## 2020-11-13 NOTE — ED Provider Notes (Signed)
Patient initially seen by Dr. Sedonia Small.  Please see his note.  Plan was to follow-up on the patient's urinalysis.  The urinalysis does show 21-50 white blood cells rare bacteria positive nitrite leukocyte esterase.  She is having urinary symptoms.  This is consistent with UTI.  Patient's last UTI grew Klebsiella that was sensitive to cefazolin.  Will discharge home with prescription for Keflex.   Dorie Rank, MD 11/13/20 [phone]

## 2020-11-15 LAB — URINE CULTURE: Culture: >=100000 — AB

## 2020-11-16 ENCOUNTER — Telehealth: Payer: Self-pay

## 2020-11-16 NOTE — Telephone Encounter (Signed)
Post ED Visit - Positive Culture Follow-up  Culture report reviewed by antimicrobial stewardship pharmacist: [facility] Team []  Elenor Quinones, Pharm.D. []  Heide Guile, Pharm.D., BCPS AQ-ID []  Parks Neptune, Pharm.D., BCPS []  Alycia Rossetti, Pharm.D., BCPS []  [place], [place].D., BCPS, AAHIVP []  Legrand Como, Pharm.D., BCPS, AAHIVP []  Salome Arnt, PharmD, BCPS []  Johnnette Gourd, PharmD, BCPS []  Hughes Better, PharmD, BCPS []  Leeroy Cha, PharmD []  Laqueta Linden, PharmD, BCPS []  Albertina Parr, PharmD  [place] Team []  Leodis Sias, PharmD []  Lindell Spar, PharmD []  Royetta Asal, PharmD []  Graylin Shiver, Rph []  Rema Fendt) Glennon Mac, PharmD []  Arlyn Dunning, PharmD []  Netta Cedars, PharmD []  Dia Sitter, PharmD []  Leone Haven, PharmD []  Gretta Arab, PharmD []  Theodis Shove, PharmD []  Peggyann Juba, PharmD [x]  Reuel Boom, PharmD   Positive urine culture Treated with Cephalexin, organism sensitive to the same and no further patient follow-up is required at this time.  Genia Del 11/16/2020, 11:31 AM

## 2020-11-27 ENCOUNTER — Ambulatory Visit (INDEPENDENT_AMBULATORY_CARE_PROVIDER_SITE_OTHER): Payer: Medicare Other

## 2020-11-27 DIAGNOSIS — I639 Cerebral infarction, unspecified: Secondary | ICD-10-CM | POA: Diagnosis not present

## 2020-11-28 LAB — CUP PACEART REMOTE DEVICE CHECK
Date Time Interrogation Session: 20220101144059
Implantable Pulse Generator Implant Date: 20210719

## 2020-11-29 ENCOUNTER — Ambulatory Visit: Payer: Medicare Other | Admitting: Adult Health

## 2020-12-08 NOTE — Progress Notes (Signed)
[facility] Summary Report / Loop Recorder 

## 2020-12-28 ENCOUNTER — Ambulatory Visit (INDEPENDENT_AMBULATORY_CARE_PROVIDER_SITE_OTHER): Payer: Medicare Other

## 2020-12-28 DIAGNOSIS — I639 Cerebral infarction, unspecified: Secondary | ICD-10-CM | POA: Diagnosis not present

## 2020-12-29 LAB — CUP PACEART REMOTE DEVICE CHECK
Date Time Interrogation Session: 20220203144052
Implantable Pulse Generator Implant Date: 20210719

## 2021-01-03 NOTE — Progress Notes (Signed)
[facility] Summary Report / Loop Recorder 

## 2021-01-29 ENCOUNTER — Ambulatory Visit (INDEPENDENT_AMBULATORY_CARE_PROVIDER_SITE_OTHER): Payer: Medicare Other

## 2021-01-29 DIAGNOSIS — I63443 Cerebral infarction due to embolism of bilateral cerebellar arteries: Secondary | ICD-10-CM

## 2021-01-31 LAB — CUP PACEART REMOTE DEVICE CHECK
Date Time Interrogation Session: 20220308144108
Implantable Pulse Generator Implant Date: 20210719

## 2021-02-06 NOTE — Progress Notes (Signed)
[facility] Summary Report / Loop Recorder 

## 2021-03-01 ENCOUNTER — Ambulatory Visit (INDEPENDENT_AMBULATORY_CARE_PROVIDER_SITE_OTHER): Payer: Medicare Other

## 2021-03-01 DIAGNOSIS — I639 Cerebral infarction, unspecified: Secondary | ICD-10-CM | POA: Diagnosis not present

## 2021-03-05 LAB — CUP PACEART REMOTE DEVICE CHECK
Date Time Interrogation Session: 20220410143640
Implantable Pulse Generator Implant Date: 20210719

## 2021-03-13 NOTE — Progress Notes (Signed)
[facility] Summary Report / Loop Recorder 

## 2021-04-02 ENCOUNTER — Ambulatory Visit (INDEPENDENT_AMBULATORY_CARE_PROVIDER_SITE_OTHER): Payer: Medicare Other

## 2021-04-02 DIAGNOSIS — I639 Cerebral infarction, unspecified: Secondary | ICD-10-CM

## 2021-04-09 LAB — CUP PACEART REMOTE DEVICE CHECK
Date Time Interrogation Session: 20220513144107
Implantable Pulse Generator Implant Date: 20210719

## 2021-04-19 NOTE — Progress Notes (Signed)
[facility] Summary Report / Loop Recorder 

## 2021-05-10 ENCOUNTER — Ambulatory Visit (INDEPENDENT_AMBULATORY_CARE_PROVIDER_SITE_OTHER): Payer: Medicare Other

## 2021-05-10 DIAGNOSIS — I639 Cerebral infarction, unspecified: Secondary | ICD-10-CM | POA: Diagnosis not present

## 2021-05-10 LAB — CUP PACEART REMOTE DEVICE CHECK
Date Time Interrogation Session: 20220615143634
Implantable Pulse Generator Implant Date: 20210719

## 2021-05-16 DIAGNOSIS — N1832 Chronic kidney disease, stage 3b: Secondary | ICD-10-CM | POA: Diagnosis not present

## 2021-05-16 DIAGNOSIS — G5791 Unspecified mononeuropathy of right lower limb: Secondary | ICD-10-CM | POA: Diagnosis not present

## 2021-05-16 DIAGNOSIS — E039 Hypothyroidism, unspecified: Secondary | ICD-10-CM | POA: Diagnosis not present

## 2021-05-16 DIAGNOSIS — D6869 Other thrombophilia: Secondary | ICD-10-CM | POA: Diagnosis not present

## 2021-05-16 DIAGNOSIS — Z85118 Personal history of other malignant neoplasm of bronchus and lung: Secondary | ICD-10-CM | POA: Diagnosis not present

## 2021-05-16 DIAGNOSIS — E782 Mixed hyperlipidemia: Secondary | ICD-10-CM | POA: Diagnosis not present

## 2021-05-16 DIAGNOSIS — I10 Essential (primary) hypertension: Secondary | ICD-10-CM | POA: Diagnosis not present

## 2021-05-16 DIAGNOSIS — F329 Major depressive disorder, single episode, unspecified: Secondary | ICD-10-CM | POA: Diagnosis not present

## 2021-05-23 ENCOUNTER — Encounter: Payer: Self-pay | Admitting: Adult Health

## 2021-05-23 ENCOUNTER — Ambulatory Visit (INDEPENDENT_AMBULATORY_CARE_PROVIDER_SITE_OTHER): Payer: Medicare Other | Admitting: Adult Health

## 2021-05-23 VITALS — BP 151/71 | HR 60 | Ht 64.0 in | Wt 113.0 lb

## 2021-05-23 DIAGNOSIS — H53461 Homonymous bilateral field defects, right side: Secondary | ICD-10-CM | POA: Diagnosis not present

## 2021-05-23 DIAGNOSIS — R260 Ataxic gait: Secondary | ICD-10-CM

## 2021-05-23 DIAGNOSIS — I69319 Unspecified symptoms and signs involving cognitive functions following cerebral infarction: Secondary | ICD-10-CM | POA: Diagnosis not present

## 2021-05-23 DIAGNOSIS — I639 Cerebral infarction, unspecified: Secondary | ICD-10-CM | POA: Diagnosis not present

## 2021-05-23 DIAGNOSIS — E785 Hyperlipidemia, unspecified: Secondary | ICD-10-CM | POA: Diagnosis not present

## 2021-05-23 DIAGNOSIS — I10 Essential (primary) hypertension: Secondary | ICD-10-CM

## 2021-05-23 NOTE — Addendum Note (Signed)
Addended by: Mal Misty on: 05/23/2021 04:37 PM   Modules accepted: Orders

## 2021-05-23 NOTE — Patient Instructions (Signed)
Referral placed for home health PT to further help you with safety and use of your walker and cane in your home  Please follow-up with your eye doctor for reevaluation of your vision  Continue aspirin 81 mg daily  and pravastatin for secondary stroke prevention  Your loop recorder has not shown atrial fibrillation thus far - will continue to be monitored by cardiology  Continue to follow up with PCP regarding cholesterol and blood pressure management  Maintain strict control of hypertension with blood pressure goal below 130/90, diabetes with hemoglobin A1c goal below 6.5% and cholesterol with LDL cholesterol (bad cholesterol) goal below 70 mg/dL.       Followup in the future with me in 6 months or call earlier if needed       Thank you for coming to see [place] at [facility]. I hope we have been able to provide you high quality care today.  You may receive a patient satisfaction survey over the next few weeks. We would appreciate your feedback and comments so that we may continue to improve ourselves and the health of our patients.

## 2021-05-23 NOTE — Progress Notes (Signed)
[facility] [address] [place]. [place] [address] [phone]       OFFICE FOLLOW UP NOTE  Ms. Valerie Young Date of Birth:  July 09, 1929 Medical Record Number:  [id number]   Referring MD: Rosalin Hawking  Reason for Referral: Stroke  Chief Complaint  Patient presents with   Follow-up    TR with daughter Valerie Young Pt is well and doing great      HPI:   Today, 05/23/2021, Valerie Young is being seen for stroke follow-up after prior visit with Dr. Leonie Man 08/03/2020.  She is accompanied by her daughter, Valerie Young.  She has been doing well since prior visit without new stroke/TIA symptoms.  She has has returned back home currently living independently with family checking on her frequently as well as use of cameras and alert button.  She is currently in the process of looking into independent living facilities.  She is able to maintain ADLs and majority of IADLs without difficulty.  Right-sided peripheral visual loss stable without worsening.  Her gait and balance have improved some and continues to use Rollator walker outdoors and cane in her home.  She did have a prior fall when not using assistive device thankfully without injury.  Mild cognitive difficulties but overall stable and denies worsening.  Compliant on aspirin and pravastatin without associated side effects.  Blood pressure today 151/71.  Loop recorder has not shown atrial fibrillation thus far.  No concerns at this time.   History provided for reference purposes only Initial visit 08/03/2020 Dr. Leonie Man: Valerie Young is a pleasant 87 year old Caucasian lady seen today for initial office consultation visit for stroke.  History is obtained from the patient and her daughter Valerie Young was accompanying her today as well as review of electronic medical records and I personally reviewed pertinent imaging films and PACS.  Valerie Young is a pleasant 87 year old lady with past medical history of hypertension, lung cancer, DVT, COPD, chronic  kidney disease, degenerative joint disease, hypothyroidism who presented to [facility] on [facility]/2020 with vision difficulties with double vision and eye movement abnormalities.  Presented outside the window for TPA.  MRI scan shows multifocal infarcts involving left PCA, bilateral cerebellum and midbrain.  CT angiogram of the brain and neck did not reveal significant large vessel stenosis or occlusion.  Echocardiogram showed normal ejection fraction without cardiac source of embolism.  Telemetry monitoring did not show any cardiac arrhythmias.  She had loop recorder placed and so for paroxysmal A. fib has not yet been found.  LDL cholesterol was 70 mg percent and hemoglobin A1c was 5.9.  EEG was negative for seizures.  Lower extremity venous Dopplers were negative for DVT.  Patient was started on aspirin and transferred to rehab and she is staying there at present.  She is able to walk with a walker and is progressing steadily with physical occupational therapy.  Patient subjectively feels her peripheral vision is improved however on exam today she has dense right homonymous hemianopsia.  She is tolerating aspirin well without bruising or bleeding.  Blood pressures well controlled today it is 147/71.  She is tolerating Pravachol well without muscle aches or side effects.  She subjectively feels her memory is fine but on my testing in the office today she recall was diminished to 0/3.  She used to live alone and wants to go back but the daughter understands that this may not be possible and she may need to move to assisted living or have more help at home.  ROS:   14 system review of systems is positive for those listed in HPI and all other systems negative  PMH:  Past Medical History:  Diagnosis Date   Blood clot associated with vein wall inflammation    Cancer (HCC)    right lung ca - 30 years ago / left lung - 11/2012   Cancer (HCC)    Left lung   CKD (chronic kidney disease)    COPD (chronic  obstructive pulmonary disease) (HCC)    DJD (degenerative joint disease)    knees   H/O: lung cancer 1982   Right; treated with resection   Hypertension    Hypothyroidism    PND (post-nasal drip)    Chronic    Social History:  Social History   Socioeconomic History   Marital status: Divorced    Spouse name: Not on file   Number of children: Not on file   Years of education: Not on file   Highest education level: Not on file  Occupational History   Occupation: Retired  Tobacco Use   Smoking status: Former    Packs/day: 1.00    Years: 35.00    Pack years: 35.00    Types: Cigarettes    Quit date: 11/25/1980    Years since quitting: 40.5   Smokeless tobacco: Never  Vaping Use   Vaping Use: Never used  Substance and Sexual Activity   Alcohol use: No   Drug use: No   Sexual activity: Never    Birth control/protection: Surgical  Other Topics Concern   Not on file  Social History Narrative   ** Merged History Encounter **       Social Determinants of Health   Financial Resource Strain: Not on file  Food Insecurity: Not on file  Transportation Needs: Not on file  Physical Activity: Not on file  Stress: Not on file  Social Connections: Not on file  Intimate Partner Violence: Not on file    Medications:   Current Outpatient Medications on File Prior to Visit  Medication Sig Dispense Refill   acetaminophen (TYLENOL) 325 MG tablet Take 650 mg by mouth 3 (three) times daily. TWO TO THREE TIMES A DAY     albuterol (PROVENTIL HFA;VENTOLIN HFA) 108 (90 Base) MCG/ACT inhaler Inhale 2 puffs into the lungs every 6 (six) hours as needed for wheezing or shortness of breath. 1 Inhaler 5   amLODipine (NORVASC) 5 MG tablet TAKE 1 TABLET (5 MG TOTAL) BY MOUTH AT BEDTIME. (Patient taking differently: Take 5 mg by mouth daily. ) 90 tablet 0   aspirin EC 81 MG tablet Take 1 tablet (81 mg total) by mouth daily. Swallow whole. 150 tablet 2   atenolol (TENORMIN) 50 MG tablet Take 1 tablet  (50 mg total) by mouth daily. 90 tablet 3   B Complex Vitamins (B COMPLEX 100 PO) Take 1 tablet by mouth daily.     cetirizine (ZYRTEC) 10 MG tablet TAKE 1 TABLET (10 MG TOTAL) BY MOUTH AT BEDTIME. (Patient taking differently: Take 10 mg by mouth daily. ) 30 tablet 2   Cholecalciferol (VITAMIN D-3) 1000 units CAPS Take 3,000 Units by mouth daily.     guaiFENesin (MUCINEX) 600 MG 12 hr tablet Take 2 tablets (1,200 mg total) by mouth 2 (two) times daily. 20 tablet 0   levothyroxine (SYNTHROID, LEVOTHROID) 50 MCG tablet Take 50 mcg by mouth daily before breakfast.   8   polyethylene glycol (MIRALAX / GLYCOLAX) 17 g packet Take 17 g  by mouth daily as needed for mild constipation. 14 each 0   polyvinyl alcohol (LIQUIFILM TEARS) 1.4 % ophthalmic solution Place 1 drop into both eyes as needed for dry eyes.     pravastatin (PRAVACHOL) 40 MG tablet Take 40 mg by mouth daily.  3   No current facility-administered medications on file prior to visit.    Allergies:   Allergies  Allergen Reactions   Sulfamethoxazole-Trimethoprim Rash   Tramadol Hcl     Other reaction(s): mental status changes    Physical Exam Today's Vitals   05/23/21 1538  BP: (!) 151/71  Pulse: 60  Weight: 113 lb (51.3 kg)  Height: 5\' 4"  (1.626 m)   Body mass index is 19.4 kg/m.   General: Frail very pleasant elderly Caucasian lady, seated, in no evident distress Head: head normocephalic and atraumatic.   Neck: supple with no carotid or supraclavicular bruits Cardiovascular: regular rate and rhythm, no murmurs Musculoskeletal: no deformity Skin:  no rash/petichiae Vascular:  Normal pulses all extremities  Neurologic Exam Mental Status: Awake and fully alert.  Fluent speech and language.  Oriented to place and time. Recent memory diminished and remote memory intact.  Diminished recall 0/3.  Able to name 8 animals which can walk on 4 legs.  Difficulty with serial addition.  Attention span, concentration and fund of  knowledge appropriate during visit. Mood and affect appropriate.  Cranial Nerves: Pupils equal, briskly reactive to light. Extraocular movements full without nystagmus. Visual fields show dense right homonymous hemianopsia to confrontation. Hearing diminished bilaterally. Facial sensation intact. Face, tongue, palate moves normally and symmetrically.  Motor: Normal bulk and tone. Normal strength in all tested extremity muscles. Sensory.: intact to touch , pinprick , position and vibratory sensation.  Coordination: Rapid alternating movements normal in all extremities. Finger-to-nose and heel-to-shin performed accurately bilaterally. Gait and Station: Arises from chair with slight  difficulty. Stance is broad-based. Gait demonstrates ataxia with slight imbalance and use of Rollator walker.  Tandem walk and heel toe not attempted. Reflexes: 1+ and symmetric. Toes downgoing.      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  Vascular risk factors of hypertension hyperlipidemia and age.     PLAN:  -Referral placed to home health PT for further balance exercises and facilitate safety in home with use of assistive device.  Discussed importance of use of assistive device at all times for fall prevention -Loop recorder has not shown atrial fibrillation thus far -Continue aspirin and pravastatin for secondary stroke prevention -Routine follow-up with PCP for aggressive stroke risk factor management including HTN with BP goal<130/90 and HLD with LDL goal<70   Follow-up in 6 months or call earlier if needed  CC:  [facility] provider: Dr. Maudie Flakes, Mancel Bale, PA   I spent 36 minutes of face-to-face and non-face-to-face time with patient and  daughter.  This included previsit chart review, lab review, study review, order entry, electronic health record documentation, and prolonged daughter and patient education and discussion regarding prior stroke and possible etiologies, secondary stroke prevention measures and importance of aggressive stroke risk factor management, routine monitoring of loop recorder, residual deficits and answered all other questions to patient and daughter satisfaction  Frann Rider, [facility]  [facility] [address] [place], [place] [address]  Phone [phone]  Fax [phone] Note: This document was prepared with digital dictation and possible smart phrase technology. Any transcriptional errors that result from this process are unintentional.

## 2021-05-24 ENCOUNTER — Telehealth: Payer: Self-pay | Admitting: Adult Health

## 2021-05-24 NOTE — Telephone Encounter (Signed)
I reached out to Meg Wannemacher with [facility] to see if they are able to take patient on.  [facility] [address], [address]  [place], [place] [address] O [phone]  M [phone] F  [phone] 

## 2021-05-25 NOTE — Progress Notes (Signed)
I agree with the above plan 

## 2021-05-29 DIAGNOSIS — I129 Hypertensive chronic kidney disease with stage 1 through stage 4 chronic kidney disease, or unspecified chronic kidney disease: Secondary | ICD-10-CM | POA: Diagnosis not present

## 2021-05-29 DIAGNOSIS — E039 Hypothyroidism, unspecified: Secondary | ICD-10-CM | POA: Diagnosis not present

## 2021-05-29 DIAGNOSIS — I69318 Other symptoms and signs involving cognitive functions following cerebral infarction: Secondary | ICD-10-CM | POA: Diagnosis not present

## 2021-05-29 DIAGNOSIS — Z86718 Personal history of other venous thrombosis and embolism: Secondary | ICD-10-CM | POA: Diagnosis not present

## 2021-05-29 DIAGNOSIS — E785 Hyperlipidemia, unspecified: Secondary | ICD-10-CM | POA: Diagnosis not present

## 2021-05-29 DIAGNOSIS — M170 Bilateral primary osteoarthritis of knee: Secondary | ICD-10-CM | POA: Diagnosis not present

## 2021-05-29 DIAGNOSIS — I69312 Visuospatial deficit and spatial neglect following cerebral infarction: Secondary | ICD-10-CM | POA: Diagnosis not present

## 2021-05-29 DIAGNOSIS — Z85118 Personal history of other malignant neoplasm of bronchus and lung: Secondary | ICD-10-CM | POA: Diagnosis not present

## 2021-05-29 DIAGNOSIS — N189 Chronic kidney disease, unspecified: Secondary | ICD-10-CM | POA: Diagnosis not present

## 2021-05-29 DIAGNOSIS — I69393 Ataxia following cerebral infarction: Secondary | ICD-10-CM | POA: Diagnosis not present

## 2021-05-29 DIAGNOSIS — J449 Chronic obstructive pulmonary disease, unspecified: Secondary | ICD-10-CM | POA: Diagnosis not present

## 2021-05-29 DIAGNOSIS — Z87891 Personal history of nicotine dependence: Secondary | ICD-10-CM | POA: Diagnosis not present

## 2021-05-29 NOTE — Telephone Encounter (Signed)
Patient will be starting with [facility] today, 7/5, per Meg with the company.

## 2021-05-31 DIAGNOSIS — I69312 Visuospatial deficit and spatial neglect following cerebral infarction: Secondary | ICD-10-CM | POA: Diagnosis not present

## 2021-05-31 DIAGNOSIS — N189 Chronic kidney disease, unspecified: Secondary | ICD-10-CM | POA: Diagnosis not present

## 2021-05-31 DIAGNOSIS — I69318 Other symptoms and signs involving cognitive functions following cerebral infarction: Secondary | ICD-10-CM | POA: Diagnosis not present

## 2021-05-31 DIAGNOSIS — I129 Hypertensive chronic kidney disease with stage 1 through stage 4 chronic kidney disease, or unspecified chronic kidney disease: Secondary | ICD-10-CM | POA: Diagnosis not present

## 2021-05-31 DIAGNOSIS — I69393 Ataxia following cerebral infarction: Secondary | ICD-10-CM | POA: Diagnosis not present

## 2021-05-31 DIAGNOSIS — M170 Bilateral primary osteoarthritis of knee: Secondary | ICD-10-CM | POA: Diagnosis not present

## 2021-05-31 NOTE — Progress Notes (Signed)
[facility] Summary Report / Loop Recorder 

## 2021-06-04 DIAGNOSIS — I69393 Ataxia following cerebral infarction: Secondary | ICD-10-CM | POA: Diagnosis not present

## 2021-06-04 DIAGNOSIS — N189 Chronic kidney disease, unspecified: Secondary | ICD-10-CM | POA: Diagnosis not present

## 2021-06-04 DIAGNOSIS — I69312 Visuospatial deficit and spatial neglect following cerebral infarction: Secondary | ICD-10-CM | POA: Diagnosis not present

## 2021-06-04 DIAGNOSIS — M170 Bilateral primary osteoarthritis of knee: Secondary | ICD-10-CM | POA: Diagnosis not present

## 2021-06-04 DIAGNOSIS — I69318 Other symptoms and signs involving cognitive functions following cerebral infarction: Secondary | ICD-10-CM | POA: Diagnosis not present

## 2021-06-04 DIAGNOSIS — I129 Hypertensive chronic kidney disease with stage 1 through stage 4 chronic kidney disease, or unspecified chronic kidney disease: Secondary | ICD-10-CM | POA: Diagnosis not present

## 2021-06-07 DIAGNOSIS — I69312 Visuospatial deficit and spatial neglect following cerebral infarction: Secondary | ICD-10-CM | POA: Diagnosis not present

## 2021-06-07 DIAGNOSIS — M170 Bilateral primary osteoarthritis of knee: Secondary | ICD-10-CM | POA: Diagnosis not present

## 2021-06-07 DIAGNOSIS — N189 Chronic kidney disease, unspecified: Secondary | ICD-10-CM | POA: Diagnosis not present

## 2021-06-07 DIAGNOSIS — I69318 Other symptoms and signs involving cognitive functions following cerebral infarction: Secondary | ICD-10-CM | POA: Diagnosis not present

## 2021-06-07 DIAGNOSIS — I69393 Ataxia following cerebral infarction: Secondary | ICD-10-CM | POA: Diagnosis not present

## 2021-06-07 DIAGNOSIS — I129 Hypertensive chronic kidney disease with stage 1 through stage 4 chronic kidney disease, or unspecified chronic kidney disease: Secondary | ICD-10-CM | POA: Diagnosis not present

## 2021-06-11 ENCOUNTER — Ambulatory Visit (INDEPENDENT_AMBULATORY_CARE_PROVIDER_SITE_OTHER): Payer: Medicare Other

## 2021-06-11 DIAGNOSIS — I639 Cerebral infarction, unspecified: Secondary | ICD-10-CM

## 2021-06-12 LAB — CUP PACEART REMOTE DEVICE CHECK
Date Time Interrogation Session: 20220718143957
Implantable Pulse Generator Implant Date: 20210719

## 2021-06-13 DIAGNOSIS — I69318 Other symptoms and signs involving cognitive functions following cerebral infarction: Secondary | ICD-10-CM | POA: Diagnosis not present

## 2021-06-13 DIAGNOSIS — M170 Bilateral primary osteoarthritis of knee: Secondary | ICD-10-CM | POA: Diagnosis not present

## 2021-06-13 DIAGNOSIS — N189 Chronic kidney disease, unspecified: Secondary | ICD-10-CM | POA: Diagnosis not present

## 2021-06-13 DIAGNOSIS — I69312 Visuospatial deficit and spatial neglect following cerebral infarction: Secondary | ICD-10-CM | POA: Diagnosis not present

## 2021-06-13 DIAGNOSIS — I69393 Ataxia following cerebral infarction: Secondary | ICD-10-CM | POA: Diagnosis not present

## 2021-06-13 DIAGNOSIS — I129 Hypertensive chronic kidney disease with stage 1 through stage 4 chronic kidney disease, or unspecified chronic kidney disease: Secondary | ICD-10-CM | POA: Diagnosis not present

## 2021-06-15 DIAGNOSIS — I69318 Other symptoms and signs involving cognitive functions following cerebral infarction: Secondary | ICD-10-CM | POA: Diagnosis not present

## 2021-06-15 DIAGNOSIS — N189 Chronic kidney disease, unspecified: Secondary | ICD-10-CM | POA: Diagnosis not present

## 2021-06-15 DIAGNOSIS — M170 Bilateral primary osteoarthritis of knee: Secondary | ICD-10-CM | POA: Diagnosis not present

## 2021-06-15 DIAGNOSIS — I69312 Visuospatial deficit and spatial neglect following cerebral infarction: Secondary | ICD-10-CM | POA: Diagnosis not present

## 2021-06-15 DIAGNOSIS — I129 Hypertensive chronic kidney disease with stage 1 through stage 4 chronic kidney disease, or unspecified chronic kidney disease: Secondary | ICD-10-CM | POA: Diagnosis not present

## 2021-06-15 DIAGNOSIS — I69393 Ataxia following cerebral infarction: Secondary | ICD-10-CM | POA: Diagnosis not present

## 2021-06-19 DIAGNOSIS — M170 Bilateral primary osteoarthritis of knee: Secondary | ICD-10-CM | POA: Diagnosis not present

## 2021-06-19 DIAGNOSIS — I129 Hypertensive chronic kidney disease with stage 1 through stage 4 chronic kidney disease, or unspecified chronic kidney disease: Secondary | ICD-10-CM | POA: Diagnosis not present

## 2021-06-19 DIAGNOSIS — N189 Chronic kidney disease, unspecified: Secondary | ICD-10-CM | POA: Diagnosis not present

## 2021-06-19 DIAGNOSIS — I69312 Visuospatial deficit and spatial neglect following cerebral infarction: Secondary | ICD-10-CM | POA: Diagnosis not present

## 2021-06-19 DIAGNOSIS — I69393 Ataxia following cerebral infarction: Secondary | ICD-10-CM | POA: Diagnosis not present

## 2021-06-19 DIAGNOSIS — I69318 Other symptoms and signs involving cognitive functions following cerebral infarction: Secondary | ICD-10-CM | POA: Diagnosis not present

## 2021-06-27 DIAGNOSIS — I69319 Unspecified symptoms and signs involving cognitive functions following cerebral infarction: Secondary | ICD-10-CM | POA: Diagnosis not present

## 2021-06-27 DIAGNOSIS — Z85118 Personal history of other malignant neoplasm of bronchus and lung: Secondary | ICD-10-CM | POA: Diagnosis not present

## 2021-06-27 DIAGNOSIS — Z23 Encounter for immunization: Secondary | ICD-10-CM | POA: Diagnosis not present

## 2021-06-27 DIAGNOSIS — Z1389 Encounter for screening for other disorder: Secondary | ICD-10-CM | POA: Diagnosis not present

## 2021-06-27 DIAGNOSIS — G5791 Unspecified mononeuropathy of right lower limb: Secondary | ICD-10-CM | POA: Diagnosis not present

## 2021-06-27 DIAGNOSIS — Z0000 Encounter for general adult medical examination without abnormal findings: Secondary | ICD-10-CM | POA: Diagnosis not present

## 2021-06-27 DIAGNOSIS — E782 Mixed hyperlipidemia: Secondary | ICD-10-CM | POA: Diagnosis not present

## 2021-06-27 DIAGNOSIS — I10 Essential (primary) hypertension: Secondary | ICD-10-CM | POA: Diagnosis not present

## 2021-06-27 DIAGNOSIS — F329 Major depressive disorder, single episode, unspecified: Secondary | ICD-10-CM | POA: Diagnosis not present

## 2021-06-27 DIAGNOSIS — I679 Cerebrovascular disease, unspecified: Secondary | ICD-10-CM | POA: Diagnosis not present

## 2021-06-27 DIAGNOSIS — E039 Hypothyroidism, unspecified: Secondary | ICD-10-CM | POA: Diagnosis not present

## 2021-06-27 DIAGNOSIS — E875 Hyperkalemia: Secondary | ICD-10-CM | POA: Diagnosis not present

## 2021-06-28 DIAGNOSIS — I69318 Other symptoms and signs involving cognitive functions following cerebral infarction: Secondary | ICD-10-CM | POA: Diagnosis not present

## 2021-06-28 DIAGNOSIS — I69312 Visuospatial deficit and spatial neglect following cerebral infarction: Secondary | ICD-10-CM | POA: Diagnosis not present

## 2021-06-28 DIAGNOSIS — J449 Chronic obstructive pulmonary disease, unspecified: Secondary | ICD-10-CM | POA: Diagnosis not present

## 2021-06-28 DIAGNOSIS — N189 Chronic kidney disease, unspecified: Secondary | ICD-10-CM | POA: Diagnosis not present

## 2021-06-28 DIAGNOSIS — I129 Hypertensive chronic kidney disease with stage 1 through stage 4 chronic kidney disease, or unspecified chronic kidney disease: Secondary | ICD-10-CM | POA: Diagnosis not present

## 2021-06-28 DIAGNOSIS — Z86718 Personal history of other venous thrombosis and embolism: Secondary | ICD-10-CM | POA: Diagnosis not present

## 2021-06-28 DIAGNOSIS — M170 Bilateral primary osteoarthritis of knee: Secondary | ICD-10-CM | POA: Diagnosis not present

## 2021-06-28 DIAGNOSIS — Z85118 Personal history of other malignant neoplasm of bronchus and lung: Secondary | ICD-10-CM | POA: Diagnosis not present

## 2021-06-28 DIAGNOSIS — E785 Hyperlipidemia, unspecified: Secondary | ICD-10-CM | POA: Diagnosis not present

## 2021-06-28 DIAGNOSIS — Z87891 Personal history of nicotine dependence: Secondary | ICD-10-CM | POA: Diagnosis not present

## 2021-06-28 DIAGNOSIS — E039 Hypothyroidism, unspecified: Secondary | ICD-10-CM | POA: Diagnosis not present

## 2021-06-28 DIAGNOSIS — I69393 Ataxia following cerebral infarction: Secondary | ICD-10-CM | POA: Diagnosis not present

## 2021-07-03 NOTE — Progress Notes (Signed)
[facility] Summary Report / Loop Recorder 

## 2021-07-05 DIAGNOSIS — I69393 Ataxia following cerebral infarction: Secondary | ICD-10-CM | POA: Diagnosis not present

## 2021-07-05 DIAGNOSIS — I69318 Other symptoms and signs involving cognitive functions following cerebral infarction: Secondary | ICD-10-CM | POA: Diagnosis not present

## 2021-07-05 DIAGNOSIS — N189 Chronic kidney disease, unspecified: Secondary | ICD-10-CM | POA: Diagnosis not present

## 2021-07-05 DIAGNOSIS — I129 Hypertensive chronic kidney disease with stage 1 through stage 4 chronic kidney disease, or unspecified chronic kidney disease: Secondary | ICD-10-CM | POA: Diagnosis not present

## 2021-07-05 DIAGNOSIS — I69312 Visuospatial deficit and spatial neglect following cerebral infarction: Secondary | ICD-10-CM | POA: Diagnosis not present

## 2021-07-05 DIAGNOSIS — M170 Bilateral primary osteoarthritis of knee: Secondary | ICD-10-CM | POA: Diagnosis not present

## 2021-07-10 DIAGNOSIS — I69393 Ataxia following cerebral infarction: Secondary | ICD-10-CM | POA: Diagnosis not present

## 2021-07-10 DIAGNOSIS — I129 Hypertensive chronic kidney disease with stage 1 through stage 4 chronic kidney disease, or unspecified chronic kidney disease: Secondary | ICD-10-CM | POA: Diagnosis not present

## 2021-07-10 DIAGNOSIS — M170 Bilateral primary osteoarthritis of knee: Secondary | ICD-10-CM | POA: Diagnosis not present

## 2021-07-10 DIAGNOSIS — I69312 Visuospatial deficit and spatial neglect following cerebral infarction: Secondary | ICD-10-CM | POA: Diagnosis not present

## 2021-07-10 DIAGNOSIS — I69318 Other symptoms and signs involving cognitive functions following cerebral infarction: Secondary | ICD-10-CM | POA: Diagnosis not present

## 2021-07-10 DIAGNOSIS — N189 Chronic kidney disease, unspecified: Secondary | ICD-10-CM | POA: Diagnosis not present

## 2021-07-12 ENCOUNTER — Ambulatory Visit (INDEPENDENT_AMBULATORY_CARE_PROVIDER_SITE_OTHER): Payer: Medicare Other

## 2021-07-12 DIAGNOSIS — I639 Cerebral infarction, unspecified: Secondary | ICD-10-CM

## 2021-07-16 LAB — CUP PACEART REMOTE DEVICE CHECK
Date Time Interrogation Session: 20220820144131
Implantable Pulse Generator Implant Date: 20210719

## 2021-07-18 DIAGNOSIS — I69312 Visuospatial deficit and spatial neglect following cerebral infarction: Secondary | ICD-10-CM | POA: Diagnosis not present

## 2021-07-18 DIAGNOSIS — I129 Hypertensive chronic kidney disease with stage 1 through stage 4 chronic kidney disease, or unspecified chronic kidney disease: Secondary | ICD-10-CM | POA: Diagnosis not present

## 2021-07-18 DIAGNOSIS — I69393 Ataxia following cerebral infarction: Secondary | ICD-10-CM | POA: Diagnosis not present

## 2021-07-18 DIAGNOSIS — M170 Bilateral primary osteoarthritis of knee: Secondary | ICD-10-CM | POA: Diagnosis not present

## 2021-07-18 DIAGNOSIS — I69318 Other symptoms and signs involving cognitive functions following cerebral infarction: Secondary | ICD-10-CM | POA: Diagnosis not present

## 2021-07-18 DIAGNOSIS — N189 Chronic kidney disease, unspecified: Secondary | ICD-10-CM | POA: Diagnosis not present

## 2021-07-31 NOTE — Progress Notes (Signed)
[facility] Summary Report / Loop Recorder 

## 2021-08-13 ENCOUNTER — Ambulatory Visit: Payer: Medicare Other

## 2021-08-15 DIAGNOSIS — N390 Urinary tract infection, site not specified: Secondary | ICD-10-CM | POA: Diagnosis not present

## 2021-08-17 LAB — CUP PACEART REMOTE DEVICE CHECK
Date Time Interrogation Session: 20220922143943
Implantable Pulse Generator Implant Date: 20210719

## 2021-09-18 ENCOUNTER — Ambulatory Visit (INDEPENDENT_AMBULATORY_CARE_PROVIDER_SITE_OTHER): Payer: Medicare Other

## 2021-09-18 DIAGNOSIS — I639 Cerebral infarction, unspecified: Secondary | ICD-10-CM

## 2021-09-18 LAB — CUP PACEART REMOTE DEVICE CHECK
Date Time Interrogation Session: 20221025143704
Implantable Pulse Generator Implant Date: 20210719

## 2021-09-26 NOTE — Progress Notes (Signed)
[facility] Summary Report / Loop Recorder 

## 2021-10-22 ENCOUNTER — Ambulatory Visit (INDEPENDENT_AMBULATORY_CARE_PROVIDER_SITE_OTHER): Payer: Medicare Other

## 2021-10-22 DIAGNOSIS — I639 Cerebral infarction, unspecified: Secondary | ICD-10-CM

## 2021-10-23 LAB — CUP PACEART REMOTE DEVICE CHECK
Date Time Interrogation Session: 20221127143857
Implantable Pulse Generator Implant Date: 20210719

## 2021-10-29 NOTE — Progress Notes (Signed)
[facility] Summary Report / Loop Recorder 

## 2021-11-27 ENCOUNTER — Ambulatory Visit (INDEPENDENT_AMBULATORY_CARE_PROVIDER_SITE_OTHER): Payer: Medicare Other

## 2021-11-27 DIAGNOSIS — I639 Cerebral infarction, unspecified: Secondary | ICD-10-CM | POA: Diagnosis not present

## 2021-11-27 LAB — CUP PACEART REMOTE DEVICE CHECK
Date Time Interrogation Session: 20230102231018
Implantable Pulse Generator Implant Date: 20210719

## 2021-12-05 ENCOUNTER — Ambulatory Visit: Payer: Medicare Other | Admitting: Adult Health

## 2021-12-05 NOTE — Progress Notes (Signed)
[facility] Summary Report / Loop Recorder 

## 2021-12-12 ENCOUNTER — Encounter (HOSPITAL_COMMUNITY): Payer: Self-pay | Admitting: Emergency Medicine

## 2021-12-12 ENCOUNTER — Emergency Department (HOSPITAL_COMMUNITY)
Admission: EM | Admit: 2021-12-12 | Discharge: 2021-12-12 | Disposition: A | Discharge status: Home / Self Care | Payer: Medicare Other | Attending: Emergency Medicine | Admitting: Emergency Medicine

## 2021-12-12 ENCOUNTER — Other Ambulatory Visit: Payer: Self-pay

## 2021-12-12 DIAGNOSIS — Z79899 Other long term (current) drug therapy: Secondary | ICD-10-CM | POA: Diagnosis not present

## 2021-12-12 DIAGNOSIS — Z743 Need for continuous supervision: Secondary | ICD-10-CM | POA: Diagnosis not present

## 2021-12-12 DIAGNOSIS — R32 Unspecified urinary incontinence: Secondary | ICD-10-CM | POA: Diagnosis not present

## 2021-12-12 DIAGNOSIS — R3981 Functional urinary incontinence: Secondary | ICD-10-CM | POA: Diagnosis not present

## 2021-12-12 DIAGNOSIS — R531 Weakness: Secondary | ICD-10-CM | POA: Diagnosis not present

## 2021-12-12 LAB — CBC WITH DIFFERENTIAL/PLATELET
Abs Immature Granulocytes: 0.01 10*3/uL (ref 0.00–0.07)
Basophils Absolute: 0 10*3/uL (ref 0.0–0.1)
Basophils Relative: 0 %
Eosinophils Absolute: 0.1 10*3/uL (ref 0.0–0.5)
Eosinophils Relative: 1 %
HCT: 43.1 % (ref 36.0–46.0)
Hemoglobin: 13.8 g/dL (ref 12.0–15.0)
Immature Granulocytes: 0 %
Lymphocytes Relative: 24 %
Lymphs Abs: 1.9 10*3/uL (ref 0.7–4.0)
MCH: 33.3 pg (ref 26.0–34.0)
MCHC: 32 g/dL (ref 30.0–36.0)
MCV: 104.1 fL — ABNORMAL HIGH (ref 80.0–100.0)
Monocytes Absolute: 0.7 10*3/uL (ref 0.1–1.0)
Monocytes Relative: 9 %
Neutro Abs: 5.3 10*3/uL (ref 1.7–7.7)
Neutrophils Relative %: 66 %
Platelets: 260 10*3/uL (ref 150–400)
RBC: 4.14 MIL/uL (ref 3.87–5.11)
RDW: 12.1 % (ref 11.5–15.5)
WBC: 8 10*3/uL (ref 4.0–10.5)
nRBC: 0 % (ref 0.0–0.2)

## 2021-12-12 LAB — BASIC METABOLIC PANEL
Anion gap: 9 (ref 5–15)
BUN: 22 mg/dL (ref 8–23)
CO2: 28 mmol/L (ref 22–32)
Calcium: 9.8 mg/dL (ref 8.9–10.3)
Chloride: 104 mmol/L (ref 98–111)
Creatinine, Ser: 1.28 mg/dL — ABNORMAL HIGH (ref 0.44–1.00)
GFR, Estimated: 40 mL/min — ABNORMAL LOW (ref >60)
Glucose, Bld: 91 mg/dL (ref 70–99)
Potassium: 4 mmol/L (ref 3.5–5.1)
Sodium: 141 mmol/L (ref 135–145)

## 2021-12-12 LAB — URINALYSIS, ROUTINE W REFLEX MICROSCOPIC
Bacteria, UA: NONE SEEN
Bilirubin Urine: NEGATIVE
Glucose, UA: NEGATIVE mg/dL
Ketones, ur: NEGATIVE mg/dL
Leukocytes,Ua: NEGATIVE
Nitrite: NEGATIVE
Protein, ur: 30 mg/dL — AB
Specific Gravity, Urine: 1.014 (ref 1.005–1.030)
pH: 6 (ref 5.0–8.0)

## 2021-12-12 NOTE — ED Notes (Signed)
Pt taken to room 15, pt told this RN that she wished to be seen by a provider.

## 2021-12-12 NOTE — ED Notes (Signed)
Patient is on a [place] and daughter is at bedside.

## 2021-12-12 NOTE — Discharge Instructions (Signed)
Return for any problem.  ?

## 2021-12-12 NOTE — ED Notes (Signed)
Pt. Was called for reassess vitals. Pt. Refused vitals and also, was called to be roomed to see the doctor but refused. Pt. Stated that her daughter was on the way to pick her up due to the long wait time. Nurse aware.

## 2021-12-12 NOTE — ED Notes (Signed)
Blood pressure is elevated. Patient states she had not taken her medication.

## 2021-12-12 NOTE — ED Provider Notes (Signed)
[facility] DEPT Provider Note   CSN: [id number] Arrival date & time: 12/12/21  1034     History  Chief Complaint  Patient presents with   Urinary Incontinence    Valerie Young is a 88 y.o. female.  88 year old female with prior medical history as detailed below presents for evaluation.  She is accompanied by her daughter.  The patient reports that earlier this morning she had an episode of significant urinary incontinence.  She reports that she sometimes has urinary incontinence -this morning's episode was more than her normal.  She denies fever.  She denies pain.  She denies dysuria.  Her daughter is concerned about possibility of early UTI.  The history is provided by the patient and medical records.  Illness Location:  Urinary incontinence Severity:  Mild Onset quality:  Sudden Duration:  1 day Timing:  Constant Progression:  Improving Chronicity:  Recurrent     Home Medications Prior to Admission medications   Medication Sig Start Date End Date Taking? Authorizing Provider  acetaminophen (TYLENOL) 325 MG tablet Take 650 mg by mouth 3 (three) times daily. TWO TO THREE TIMES A DAY    [provider]  albuterol (PROVENTIL HFA;VENTOLIN HFA) 108 (90 Base) MCG/ACT inhaler Inhale 2 puffs into the lungs every 6 (six) hours as needed for wheezing or shortness of breath. 10/15/18   Collene Gobble, MD  amLODipine (NORVASC) 5 MG tablet TAKE 1 TABLET (5 MG TOTAL) BY MOUTH AT BEDTIME. Patient taking differently: Take 5 mg by mouth daily.     Tresa Garter, MD  atenolol (TENORMIN) 50 MG tablet Take 1 tablet (50 mg total) by mouth daily. 01/06/14   Tresa Garter, MD  B Complex Vitamins (B COMPLEX 100 PO) Take 1 tablet by mouth daily.    [provider]  cetirizine (ZYRTEC) 10 MG tablet TAKE 1 TABLET (10 MG TOTAL) BY MOUTH AT BEDTIME. Patient taking differently: Take 10 mg by mouth daily.  11/06/15   Collene Gobble, MD   Cholecalciferol (VITAMIN D-3) 1000 units CAPS Take 3,000 Units by mouth daily.    [provider]  guaiFENesin (MUCINEX) 600 MG 12 hr tablet Take 2 tablets (1,200 mg total) by mouth 2 (two) times daily. 10/06/15   Hosie Poisson, MD  levothyroxine (SYNTHROID, LEVOTHROID) 50 MCG tablet Take 50 mcg by mouth daily before breakfast.  09/12/15   [provider]  polyethylene glycol (MIRALAX / GLYCOLAX) 17 g packet Take 17 g by mouth daily as needed for mild constipation. 06/12/20   Raiford Noble Latif, DO  polyvinyl alcohol (LIQUIFILM TEARS) 1.4 % ophthalmic solution Place 1 drop into both eyes as needed for dry eyes.    [provider]  pravastatin (PRAVACHOL) 40 MG tablet Take 40 mg by mouth daily. 07/16/15   [provider]      Allergies    Sulfamethoxazole-trimethoprim and Tramadol hcl    Review of Systems   Review of Systems  All other systems reviewed and are negative.  Physical Exam Updated Vital Signs BP (!) 186/73    Pulse 71    Temp 98.9 F (37.2 C) (Oral)    Resp 18    Ht 5\' 4"  (1.626 m)    Wt 51.3 kg    SpO2 100%    BMI 19.40 kg/m  Physical Exam Vitals and nursing note reviewed.  Constitutional:      General: She is not in acute distress.    Appearance: Normal appearance.  She is well-developed.  HENT:     Head: Normocephalic and atraumatic.  Eyes:     Conjunctiva/sclera: Conjunctivae normal.     Pupils: Pupils are equal, round, and reactive to light.  Cardiovascular:     Rate and Rhythm: Normal rate and regular rhythm.     Heart sounds: Normal heart sounds.  Pulmonary:     Effort: Pulmonary effort is normal. No respiratory distress.     Breath sounds: Normal breath sounds.  Abdominal:     General: There is no distension.     Palpations: Abdomen is soft.     Tenderness: There is no abdominal tenderness.  Musculoskeletal:        General: No deformity. Normal range of motion.     Cervical back: Normal range of motion and neck supple.   Skin:    General: Skin is warm and dry.  Neurological:     General: No focal deficit present.     Mental Status: She is alert. Mental status is at baseline.     Cranial Nerves: No cranial nerve deficit.     Sensory: No sensory deficit.     Motor: No weakness.     Coordination: Coordination normal.    ED Results / Procedures / Treatments   Labs (all labs ordered are listed, but only abnormal results are displayed) Labs Reviewed  CBC WITH DIFFERENTIAL/PLATELET - Abnormal; Notable for the following components:      Result Value   MCV 104.1 (*)    All other components within normal limits  URINE CULTURE  BASIC METABOLIC PANEL  URINALYSIS, ROUTINE W REFLEX MICROSCOPIC    EKG None  Radiology No results found.  Procedures Procedures    Medications Ordered in ED Medications - No data to display  ED Course/ Medical Decision Making/ A&P                           Medical Decision Making Amount and/or Complexity of Data Reviewed Labs: ordered.    Medical Screen Complete  This patient presented to the ED with complaint of urinary incontinence.  This complaint involves an extensive number of treatment options. The initial differential diagnosis includes, but is not limited to, UTI, incontinence, metabolic abnormality, etc.  This presentation is: Acute, Chronic, Self-Limited, Previously Undiagnosed, Uncertain Prognosis, Complicated, Systemic Symptoms, and Threat to Life/Bodily Function  Patient present with complaint of urinary incontinence.  Patient's family requested evaluation for possible dehydration and/or UTI.  Work-up is without evidence of significant acute pathology  Patient is comfortable.  Patient and patient's family desired DC home.  They are aware of need for close follow-up with their regular PCP.   Co morbidities that complicated the patient's evaluation  Advanced age   Additional history obtained:  Additional history obtained from  [facility] External records from outside sources obtained and reviewed including prior ED visits and prior Inpatient records.    Lab Tests:  I ordered and personally interpreted labs.  The pertinent results include:  cbc bmp ua       Problem List / ED Course:  Urinary incontinence   Reevaluation:  After the interventions noted above, I reevaluated the patient and found that they have: improved    Dispostion:  After consideration of the diagnostic results and the patients response to treatment, I feel that the patent would benefit from close outpatient followup.            Final Clinical Impression(s) / ED  Diagnoses Final diagnoses:  Urinary incontinence, unspecified type    Rx / DC Orders ED Discharge Orders     None         Valarie Merino, MD 12/12/21 2031

## 2021-12-12 NOTE — ED Triage Notes (Signed)
Per [facility] pt coming from home c/o urinary incontinence last night which is unusual for her.

## 2021-12-14 LAB — URINE CULTURE: Culture: NO GROWTH

## 2021-12-31 ENCOUNTER — Ambulatory Visit (INDEPENDENT_AMBULATORY_CARE_PROVIDER_SITE_OTHER): Payer: Medicare Other

## 2021-12-31 DIAGNOSIS — I639 Cerebral infarction, unspecified: Secondary | ICD-10-CM | POA: Diagnosis not present

## 2021-12-31 LAB — CUP PACEART REMOTE DEVICE CHECK
Date Time Interrogation Session: 20230205230427
Implantable Pulse Generator Implant Date: 20210719

## 2022-01-02 NOTE — Progress Notes (Signed)
[facility] Summary Report / Loop Recorder 

## 2022-02-04 ENCOUNTER — Ambulatory Visit (INDEPENDENT_AMBULATORY_CARE_PROVIDER_SITE_OTHER): Payer: Medicare Other

## 2022-02-04 DIAGNOSIS — I639 Cerebral infarction, unspecified: Secondary | ICD-10-CM

## 2022-02-04 LAB — CUP PACEART REMOTE DEVICE CHECK
Date Time Interrogation Session: 20230312230516
Implantable Pulse Generator Implant Date: 20210719

## 2022-02-14 NOTE — Progress Notes (Signed)
[facility] Summary Report / Loop Recorder 

## 2022-03-11 ENCOUNTER — Ambulatory Visit (INDEPENDENT_AMBULATORY_CARE_PROVIDER_SITE_OTHER): Payer: Medicare Other

## 2022-03-11 DIAGNOSIS — I639 Cerebral infarction, unspecified: Secondary | ICD-10-CM | POA: Diagnosis not present

## 2022-03-11 LAB — CUP PACEART REMOTE DEVICE CHECK
Date Time Interrogation Session: 20230414230920
Implantable Pulse Generator Implant Date: 20210719

## 2022-03-26 ENCOUNTER — Other Ambulatory Visit: Payer: Self-pay

## 2022-03-26 NOTE — Patient Outreach (Signed)
[facility]) Care Management ? ?03/26/2022 ? ?Valerie Young ?[birth year] ?[id number] ? ? ?Received Emmi call from patient daughter. Went over engagement tool with her. Pcp is in embedded practice sent to [facility] for follow up. ? ?Philmore Pali ?[facility] Management Assistant ?[phone] ? ?

## 2022-03-27 NOTE — Progress Notes (Signed)
[facility] Summary Report / Loop Recorder 

## 2022-04-12 LAB — CUP PACEART REMOTE DEVICE CHECK
Date Time Interrogation Session: 20230517230915
Implantable Pulse Generator Implant Date: 20210719

## 2022-04-15 ENCOUNTER — Ambulatory Visit (INDEPENDENT_AMBULATORY_CARE_PROVIDER_SITE_OTHER): Payer: Medicare Other

## 2022-04-15 DIAGNOSIS — I639 Cerebral infarction, unspecified: Secondary | ICD-10-CM

## 2022-05-01 NOTE — Progress Notes (Signed)
[facility] Summary Report / Loop Recorder 

## 2022-05-20 ENCOUNTER — Ambulatory Visit (INDEPENDENT_AMBULATORY_CARE_PROVIDER_SITE_OTHER): Payer: Medicare Other

## 2022-05-20 DIAGNOSIS — I639 Cerebral infarction, unspecified: Secondary | ICD-10-CM | POA: Diagnosis not present

## 2022-06-17 NOTE — Progress Notes (Signed)
[facility] Summary Report / Loop Recorder 

## 2022-06-20 LAB — CUP PACEART REMOTE DEVICE CHECK
Date Time Interrogation Session: 20230722230643
Implantable Pulse Generator Implant Date: 20210719

## 2022-06-24 ENCOUNTER — Ambulatory Visit (INDEPENDENT_AMBULATORY_CARE_PROVIDER_SITE_OTHER): Payer: Medicare Other

## 2022-06-24 DIAGNOSIS — I639 Cerebral infarction, unspecified: Secondary | ICD-10-CM

## 2022-07-05 DIAGNOSIS — I10 Essential (primary) hypertension: Secondary | ICD-10-CM | POA: Diagnosis not present

## 2022-07-05 DIAGNOSIS — I679 Cerebrovascular disease, unspecified: Secondary | ICD-10-CM | POA: Diagnosis not present

## 2022-07-05 DIAGNOSIS — G5791 Unspecified mononeuropathy of right lower limb: Secondary | ICD-10-CM | POA: Diagnosis not present

## 2022-07-05 DIAGNOSIS — I69319 Unspecified symptoms and signs involving cognitive functions following cerebral infarction: Secondary | ICD-10-CM | POA: Diagnosis not present

## 2022-07-05 DIAGNOSIS — R636 Underweight: Secondary | ICD-10-CM | POA: Diagnosis not present

## 2022-07-05 DIAGNOSIS — F329 Major depressive disorder, single episode, unspecified: Secondary | ICD-10-CM | POA: Diagnosis not present

## 2022-07-05 DIAGNOSIS — N1832 Chronic kidney disease, stage 3b: Secondary | ICD-10-CM | POA: Diagnosis not present

## 2022-07-05 DIAGNOSIS — Z85118 Personal history of other malignant neoplasm of bronchus and lung: Secondary | ICD-10-CM | POA: Diagnosis not present

## 2022-07-05 DIAGNOSIS — E875 Hyperkalemia: Secondary | ICD-10-CM | POA: Diagnosis not present

## 2022-07-05 DIAGNOSIS — Z0000 Encounter for general adult medical examination without abnormal findings: Secondary | ICD-10-CM | POA: Diagnosis not present

## 2022-07-05 DIAGNOSIS — E039 Hypothyroidism, unspecified: Secondary | ICD-10-CM | POA: Diagnosis not present

## 2022-07-05 DIAGNOSIS — E782 Mixed hyperlipidemia: Secondary | ICD-10-CM | POA: Diagnosis not present

## 2022-07-11 DIAGNOSIS — Z85110 Personal history of malignant carcinoid tumor of bronchus and lung: Secondary | ICD-10-CM | POA: Diagnosis not present

## 2022-07-11 DIAGNOSIS — G5791 Unspecified mononeuropathy of right lower limb: Secondary | ICD-10-CM | POA: Diagnosis not present

## 2022-07-11 DIAGNOSIS — I4891 Unspecified atrial fibrillation: Secondary | ICD-10-CM | POA: Diagnosis not present

## 2022-07-11 DIAGNOSIS — G9341 Metabolic encephalopathy: Secondary | ICD-10-CM | POA: Diagnosis not present

## 2022-07-11 DIAGNOSIS — F09 Unspecified mental disorder due to known physiological condition: Secondary | ICD-10-CM | POA: Diagnosis not present

## 2022-07-11 DIAGNOSIS — Z90710 Acquired absence of both cervix and uterus: Secondary | ICD-10-CM | POA: Diagnosis not present

## 2022-07-11 DIAGNOSIS — Z87891 Personal history of nicotine dependence: Secondary | ICD-10-CM | POA: Diagnosis not present

## 2022-07-11 DIAGNOSIS — I69318 Other symptoms and signs involving cognitive functions following cerebral infarction: Secondary | ICD-10-CM | POA: Diagnosis not present

## 2022-07-11 DIAGNOSIS — I69354 Hemiplegia and hemiparesis following cerebral infarction affecting left non-dominant side: Secondary | ICD-10-CM | POA: Diagnosis not present

## 2022-07-11 DIAGNOSIS — F32A Depression, unspecified: Secondary | ICD-10-CM | POA: Diagnosis not present

## 2022-07-11 DIAGNOSIS — E782 Mixed hyperlipidemia: Secondary | ICD-10-CM | POA: Diagnosis not present

## 2022-07-11 DIAGNOSIS — M1712 Unilateral primary osteoarthritis, left knee: Secondary | ICD-10-CM | POA: Diagnosis not present

## 2022-07-11 DIAGNOSIS — Z96651 Presence of right artificial knee joint: Secondary | ICD-10-CM | POA: Diagnosis not present

## 2022-07-11 DIAGNOSIS — Z9181 History of falling: Secondary | ICD-10-CM | POA: Diagnosis not present

## 2022-07-11 DIAGNOSIS — J449 Chronic obstructive pulmonary disease, unspecified: Secondary | ICD-10-CM | POA: Diagnosis not present

## 2022-07-11 DIAGNOSIS — F0150 Vascular dementia without behavioral disturbance: Secondary | ICD-10-CM | POA: Diagnosis not present

## 2022-07-11 DIAGNOSIS — E559 Vitamin D deficiency, unspecified: Secondary | ICD-10-CM | POA: Diagnosis not present

## 2022-07-11 DIAGNOSIS — I129 Hypertensive chronic kidney disease with stage 1 through stage 4 chronic kidney disease, or unspecified chronic kidney disease: Secondary | ICD-10-CM | POA: Diagnosis not present

## 2022-07-11 DIAGNOSIS — E039 Hypothyroidism, unspecified: Secondary | ICD-10-CM | POA: Diagnosis not present

## 2022-07-11 DIAGNOSIS — N1832 Chronic kidney disease, stage 3b: Secondary | ICD-10-CM | POA: Diagnosis not present

## 2022-07-12 ENCOUNTER — Telehealth: Payer: Self-pay | Admitting: Emergency Medicine

## 2022-07-12 DIAGNOSIS — I69354 Hemiplegia and hemiparesis following cerebral infarction affecting left non-dominant side: Secondary | ICD-10-CM | POA: Diagnosis not present

## 2022-07-12 DIAGNOSIS — J449 Chronic obstructive pulmonary disease, unspecified: Secondary | ICD-10-CM | POA: Diagnosis not present

## 2022-07-12 DIAGNOSIS — I129 Hypertensive chronic kidney disease with stage 1 through stage 4 chronic kidney disease, or unspecified chronic kidney disease: Secondary | ICD-10-CM | POA: Diagnosis not present

## 2022-07-12 DIAGNOSIS — F0150 Vascular dementia without behavioral disturbance: Secondary | ICD-10-CM | POA: Diagnosis not present

## 2022-07-12 DIAGNOSIS — I69318 Other symptoms and signs involving cognitive functions following cerebral infarction: Secondary | ICD-10-CM | POA: Diagnosis not present

## 2022-07-12 DIAGNOSIS — F09 Unspecified mental disorder due to known physiological condition: Secondary | ICD-10-CM | POA: Diagnosis not present

## 2022-07-12 NOTE — Telephone Encounter (Signed)
Created in error

## 2022-07-15 DIAGNOSIS — J449 Chronic obstructive pulmonary disease, unspecified: Secondary | ICD-10-CM | POA: Diagnosis not present

## 2022-07-15 DIAGNOSIS — I69318 Other symptoms and signs involving cognitive functions following cerebral infarction: Secondary | ICD-10-CM | POA: Diagnosis not present

## 2022-07-15 DIAGNOSIS — I129 Hypertensive chronic kidney disease with stage 1 through stage 4 chronic kidney disease, or unspecified chronic kidney disease: Secondary | ICD-10-CM | POA: Diagnosis not present

## 2022-07-15 DIAGNOSIS — F0150 Vascular dementia without behavioral disturbance: Secondary | ICD-10-CM | POA: Diagnosis not present

## 2022-07-15 DIAGNOSIS — I69354 Hemiplegia and hemiparesis following cerebral infarction affecting left non-dominant side: Secondary | ICD-10-CM | POA: Diagnosis not present

## 2022-07-15 DIAGNOSIS — F09 Unspecified mental disorder due to known physiological condition: Secondary | ICD-10-CM | POA: Diagnosis not present

## 2022-07-17 DIAGNOSIS — F0150 Vascular dementia without behavioral disturbance: Secondary | ICD-10-CM | POA: Diagnosis not present

## 2022-07-17 DIAGNOSIS — J449 Chronic obstructive pulmonary disease, unspecified: Secondary | ICD-10-CM | POA: Diagnosis not present

## 2022-07-17 DIAGNOSIS — I129 Hypertensive chronic kidney disease with stage 1 through stage 4 chronic kidney disease, or unspecified chronic kidney disease: Secondary | ICD-10-CM | POA: Diagnosis not present

## 2022-07-17 DIAGNOSIS — F09 Unspecified mental disorder due to known physiological condition: Secondary | ICD-10-CM | POA: Diagnosis not present

## 2022-07-17 DIAGNOSIS — I69318 Other symptoms and signs involving cognitive functions following cerebral infarction: Secondary | ICD-10-CM | POA: Diagnosis not present

## 2022-07-17 DIAGNOSIS — I69354 Hemiplegia and hemiparesis following cerebral infarction affecting left non-dominant side: Secondary | ICD-10-CM | POA: Diagnosis not present

## 2022-07-18 DIAGNOSIS — J449 Chronic obstructive pulmonary disease, unspecified: Secondary | ICD-10-CM | POA: Diagnosis not present

## 2022-07-18 DIAGNOSIS — F09 Unspecified mental disorder due to known physiological condition: Secondary | ICD-10-CM | POA: Diagnosis not present

## 2022-07-18 DIAGNOSIS — I69354 Hemiplegia and hemiparesis following cerebral infarction affecting left non-dominant side: Secondary | ICD-10-CM | POA: Diagnosis not present

## 2022-07-18 DIAGNOSIS — I69318 Other symptoms and signs involving cognitive functions following cerebral infarction: Secondary | ICD-10-CM | POA: Diagnosis not present

## 2022-07-18 DIAGNOSIS — F0150 Vascular dementia without behavioral disturbance: Secondary | ICD-10-CM | POA: Diagnosis not present

## 2022-07-18 DIAGNOSIS — I129 Hypertensive chronic kidney disease with stage 1 through stage 4 chronic kidney disease, or unspecified chronic kidney disease: Secondary | ICD-10-CM | POA: Diagnosis not present

## 2022-07-23 DIAGNOSIS — I69318 Other symptoms and signs involving cognitive functions following cerebral infarction: Secondary | ICD-10-CM | POA: Diagnosis not present

## 2022-07-23 DIAGNOSIS — F09 Unspecified mental disorder due to known physiological condition: Secondary | ICD-10-CM | POA: Diagnosis not present

## 2022-07-23 DIAGNOSIS — J449 Chronic obstructive pulmonary disease, unspecified: Secondary | ICD-10-CM | POA: Diagnosis not present

## 2022-07-23 DIAGNOSIS — I129 Hypertensive chronic kidney disease with stage 1 through stage 4 chronic kidney disease, or unspecified chronic kidney disease: Secondary | ICD-10-CM | POA: Diagnosis not present

## 2022-07-23 DIAGNOSIS — F0150 Vascular dementia without behavioral disturbance: Secondary | ICD-10-CM | POA: Diagnosis not present

## 2022-07-23 DIAGNOSIS — I69354 Hemiplegia and hemiparesis following cerebral infarction affecting left non-dominant side: Secondary | ICD-10-CM | POA: Diagnosis not present

## 2022-07-24 DIAGNOSIS — F09 Unspecified mental disorder due to known physiological condition: Secondary | ICD-10-CM | POA: Diagnosis not present

## 2022-07-24 DIAGNOSIS — F0150 Vascular dementia without behavioral disturbance: Secondary | ICD-10-CM | POA: Diagnosis not present

## 2022-07-24 DIAGNOSIS — I69354 Hemiplegia and hemiparesis following cerebral infarction affecting left non-dominant side: Secondary | ICD-10-CM | POA: Diagnosis not present

## 2022-07-24 DIAGNOSIS — I69318 Other symptoms and signs involving cognitive functions following cerebral infarction: Secondary | ICD-10-CM | POA: Diagnosis not present

## 2022-07-24 DIAGNOSIS — J449 Chronic obstructive pulmonary disease, unspecified: Secondary | ICD-10-CM | POA: Diagnosis not present

## 2022-07-24 DIAGNOSIS — I129 Hypertensive chronic kidney disease with stage 1 through stage 4 chronic kidney disease, or unspecified chronic kidney disease: Secondary | ICD-10-CM | POA: Diagnosis not present

## 2022-07-26 NOTE — Progress Notes (Signed)
[facility] Summary Report / Loop Recorder 

## 2022-07-29 LAB — CUP PACEART REMOTE DEVICE CHECK
Date Time Interrogation Session: 20230830231143
Implantable Pulse Generator Implant Date: 20210719

## 2022-07-30 ENCOUNTER — Ambulatory Visit (INDEPENDENT_AMBULATORY_CARE_PROVIDER_SITE_OTHER): Payer: Medicare Other

## 2022-07-30 DIAGNOSIS — I639 Cerebral infarction, unspecified: Secondary | ICD-10-CM

## 2022-07-30 DIAGNOSIS — I129 Hypertensive chronic kidney disease with stage 1 through stage 4 chronic kidney disease, or unspecified chronic kidney disease: Secondary | ICD-10-CM | POA: Diagnosis not present

## 2022-07-30 DIAGNOSIS — F09 Unspecified mental disorder due to known physiological condition: Secondary | ICD-10-CM | POA: Diagnosis not present

## 2022-07-30 DIAGNOSIS — I69354 Hemiplegia and hemiparesis following cerebral infarction affecting left non-dominant side: Secondary | ICD-10-CM | POA: Diagnosis not present

## 2022-07-30 DIAGNOSIS — I69318 Other symptoms and signs involving cognitive functions following cerebral infarction: Secondary | ICD-10-CM | POA: Diagnosis not present

## 2022-07-30 DIAGNOSIS — F0150 Vascular dementia without behavioral disturbance: Secondary | ICD-10-CM | POA: Diagnosis not present

## 2022-07-30 DIAGNOSIS — J449 Chronic obstructive pulmonary disease, unspecified: Secondary | ICD-10-CM | POA: Diagnosis not present

## 2022-08-01 DIAGNOSIS — F09 Unspecified mental disorder due to known physiological condition: Secondary | ICD-10-CM | POA: Diagnosis not present

## 2022-08-01 DIAGNOSIS — I69318 Other symptoms and signs involving cognitive functions following cerebral infarction: Secondary | ICD-10-CM | POA: Diagnosis not present

## 2022-08-01 DIAGNOSIS — F0150 Vascular dementia without behavioral disturbance: Secondary | ICD-10-CM | POA: Diagnosis not present

## 2022-08-01 DIAGNOSIS — I129 Hypertensive chronic kidney disease with stage 1 through stage 4 chronic kidney disease, or unspecified chronic kidney disease: Secondary | ICD-10-CM | POA: Diagnosis not present

## 2022-08-01 DIAGNOSIS — I69354 Hemiplegia and hemiparesis following cerebral infarction affecting left non-dominant side: Secondary | ICD-10-CM | POA: Diagnosis not present

## 2022-08-01 DIAGNOSIS — J449 Chronic obstructive pulmonary disease, unspecified: Secondary | ICD-10-CM | POA: Diagnosis not present

## 2022-08-06 DIAGNOSIS — I69318 Other symptoms and signs involving cognitive functions following cerebral infarction: Secondary | ICD-10-CM | POA: Diagnosis not present

## 2022-08-06 DIAGNOSIS — F09 Unspecified mental disorder due to known physiological condition: Secondary | ICD-10-CM | POA: Diagnosis not present

## 2022-08-06 DIAGNOSIS — I129 Hypertensive chronic kidney disease with stage 1 through stage 4 chronic kidney disease, or unspecified chronic kidney disease: Secondary | ICD-10-CM | POA: Diagnosis not present

## 2022-08-06 DIAGNOSIS — F0150 Vascular dementia without behavioral disturbance: Secondary | ICD-10-CM | POA: Diagnosis not present

## 2022-08-06 DIAGNOSIS — I69354 Hemiplegia and hemiparesis following cerebral infarction affecting left non-dominant side: Secondary | ICD-10-CM | POA: Diagnosis not present

## 2022-08-06 DIAGNOSIS — J449 Chronic obstructive pulmonary disease, unspecified: Secondary | ICD-10-CM | POA: Diagnosis not present

## 2022-08-08 DIAGNOSIS — F0150 Vascular dementia without behavioral disturbance: Secondary | ICD-10-CM | POA: Diagnosis not present

## 2022-08-08 DIAGNOSIS — I69318 Other symptoms and signs involving cognitive functions following cerebral infarction: Secondary | ICD-10-CM | POA: Diagnosis not present

## 2022-08-08 DIAGNOSIS — I69354 Hemiplegia and hemiparesis following cerebral infarction affecting left non-dominant side: Secondary | ICD-10-CM | POA: Diagnosis not present

## 2022-08-08 DIAGNOSIS — F09 Unspecified mental disorder due to known physiological condition: Secondary | ICD-10-CM | POA: Diagnosis not present

## 2022-08-08 DIAGNOSIS — J449 Chronic obstructive pulmonary disease, unspecified: Secondary | ICD-10-CM | POA: Diagnosis not present

## 2022-08-08 DIAGNOSIS — I129 Hypertensive chronic kidney disease with stage 1 through stage 4 chronic kidney disease, or unspecified chronic kidney disease: Secondary | ICD-10-CM | POA: Diagnosis not present

## 2022-08-10 DIAGNOSIS — N1832 Chronic kidney disease, stage 3b: Secondary | ICD-10-CM | POA: Diagnosis not present

## 2022-08-10 DIAGNOSIS — I69354 Hemiplegia and hemiparesis following cerebral infarction affecting left non-dominant side: Secondary | ICD-10-CM | POA: Diagnosis not present

## 2022-08-10 DIAGNOSIS — Z90710 Acquired absence of both cervix and uterus: Secondary | ICD-10-CM | POA: Diagnosis not present

## 2022-08-10 DIAGNOSIS — E559 Vitamin D deficiency, unspecified: Secondary | ICD-10-CM | POA: Diagnosis not present

## 2022-08-10 DIAGNOSIS — Z96651 Presence of right artificial knee joint: Secondary | ICD-10-CM | POA: Diagnosis not present

## 2022-08-10 DIAGNOSIS — Z9181 History of falling: Secondary | ICD-10-CM | POA: Diagnosis not present

## 2022-08-10 DIAGNOSIS — Z87891 Personal history of nicotine dependence: Secondary | ICD-10-CM | POA: Diagnosis not present

## 2022-08-10 DIAGNOSIS — E039 Hypothyroidism, unspecified: Secondary | ICD-10-CM | POA: Diagnosis not present

## 2022-08-10 DIAGNOSIS — M1712 Unilateral primary osteoarthritis, left knee: Secondary | ICD-10-CM | POA: Diagnosis not present

## 2022-08-10 DIAGNOSIS — J449 Chronic obstructive pulmonary disease, unspecified: Secondary | ICD-10-CM | POA: Diagnosis not present

## 2022-08-10 DIAGNOSIS — I129 Hypertensive chronic kidney disease with stage 1 through stage 4 chronic kidney disease, or unspecified chronic kidney disease: Secondary | ICD-10-CM | POA: Diagnosis not present

## 2022-08-10 DIAGNOSIS — G5791 Unspecified mononeuropathy of right lower limb: Secondary | ICD-10-CM | POA: Diagnosis not present

## 2022-08-10 DIAGNOSIS — Z85110 Personal history of malignant carcinoid tumor of bronchus and lung: Secondary | ICD-10-CM | POA: Diagnosis not present

## 2022-08-10 DIAGNOSIS — E782 Mixed hyperlipidemia: Secondary | ICD-10-CM | POA: Diagnosis not present

## 2022-08-10 DIAGNOSIS — F09 Unspecified mental disorder due to known physiological condition: Secondary | ICD-10-CM | POA: Diagnosis not present

## 2022-08-10 DIAGNOSIS — I69318 Other symptoms and signs involving cognitive functions following cerebral infarction: Secondary | ICD-10-CM | POA: Diagnosis not present

## 2022-08-10 DIAGNOSIS — F32A Depression, unspecified: Secondary | ICD-10-CM | POA: Diagnosis not present

## 2022-08-10 DIAGNOSIS — F0150 Vascular dementia without behavioral disturbance: Secondary | ICD-10-CM | POA: Diagnosis not present

## 2022-08-10 DIAGNOSIS — I4891 Unspecified atrial fibrillation: Secondary | ICD-10-CM | POA: Diagnosis not present

## 2022-08-10 DIAGNOSIS — G9341 Metabolic encephalopathy: Secondary | ICD-10-CM | POA: Diagnosis not present

## 2022-08-12 DIAGNOSIS — F09 Unspecified mental disorder due to known physiological condition: Secondary | ICD-10-CM | POA: Diagnosis not present

## 2022-08-12 DIAGNOSIS — I69318 Other symptoms and signs involving cognitive functions following cerebral infarction: Secondary | ICD-10-CM | POA: Diagnosis not present

## 2022-08-12 DIAGNOSIS — J449 Chronic obstructive pulmonary disease, unspecified: Secondary | ICD-10-CM | POA: Diagnosis not present

## 2022-08-12 DIAGNOSIS — F0150 Vascular dementia without behavioral disturbance: Secondary | ICD-10-CM | POA: Diagnosis not present

## 2022-08-12 DIAGNOSIS — I69354 Hemiplegia and hemiparesis following cerebral infarction affecting left non-dominant side: Secondary | ICD-10-CM | POA: Diagnosis not present

## 2022-08-12 DIAGNOSIS — I129 Hypertensive chronic kidney disease with stage 1 through stage 4 chronic kidney disease, or unspecified chronic kidney disease: Secondary | ICD-10-CM | POA: Diagnosis not present

## 2022-08-19 DIAGNOSIS — F0150 Vascular dementia without behavioral disturbance: Secondary | ICD-10-CM | POA: Diagnosis not present

## 2022-08-19 DIAGNOSIS — I69354 Hemiplegia and hemiparesis following cerebral infarction affecting left non-dominant side: Secondary | ICD-10-CM | POA: Diagnosis not present

## 2022-08-19 DIAGNOSIS — I69318 Other symptoms and signs involving cognitive functions following cerebral infarction: Secondary | ICD-10-CM | POA: Diagnosis not present

## 2022-08-19 DIAGNOSIS — I129 Hypertensive chronic kidney disease with stage 1 through stage 4 chronic kidney disease, or unspecified chronic kidney disease: Secondary | ICD-10-CM | POA: Diagnosis not present

## 2022-08-19 DIAGNOSIS — J449 Chronic obstructive pulmonary disease, unspecified: Secondary | ICD-10-CM | POA: Diagnosis not present

## 2022-08-19 DIAGNOSIS — F09 Unspecified mental disorder due to known physiological condition: Secondary | ICD-10-CM | POA: Diagnosis not present

## 2022-08-19 NOTE — Progress Notes (Signed)
[facility] Summary Report / Loop Recorder 

## 2022-08-20 DIAGNOSIS — I129 Hypertensive chronic kidney disease with stage 1 through stage 4 chronic kidney disease, or unspecified chronic kidney disease: Secondary | ICD-10-CM | POA: Diagnosis not present

## 2022-08-20 DIAGNOSIS — F0150 Vascular dementia without behavioral disturbance: Secondary | ICD-10-CM | POA: Diagnosis not present

## 2022-08-20 DIAGNOSIS — F09 Unspecified mental disorder due to known physiological condition: Secondary | ICD-10-CM | POA: Diagnosis not present

## 2022-08-20 DIAGNOSIS — J449 Chronic obstructive pulmonary disease, unspecified: Secondary | ICD-10-CM | POA: Diagnosis not present

## 2022-08-20 DIAGNOSIS — I69354 Hemiplegia and hemiparesis following cerebral infarction affecting left non-dominant side: Secondary | ICD-10-CM | POA: Diagnosis not present

## 2022-08-20 DIAGNOSIS — I69318 Other symptoms and signs involving cognitive functions following cerebral infarction: Secondary | ICD-10-CM | POA: Diagnosis not present

## 2022-08-26 DIAGNOSIS — J449 Chronic obstructive pulmonary disease, unspecified: Secondary | ICD-10-CM | POA: Diagnosis not present

## 2022-08-26 DIAGNOSIS — F09 Unspecified mental disorder due to known physiological condition: Secondary | ICD-10-CM | POA: Diagnosis not present

## 2022-08-26 DIAGNOSIS — I129 Hypertensive chronic kidney disease with stage 1 through stage 4 chronic kidney disease, or unspecified chronic kidney disease: Secondary | ICD-10-CM | POA: Diagnosis not present

## 2022-08-26 DIAGNOSIS — I69318 Other symptoms and signs involving cognitive functions following cerebral infarction: Secondary | ICD-10-CM | POA: Diagnosis not present

## 2022-08-26 DIAGNOSIS — I69354 Hemiplegia and hemiparesis following cerebral infarction affecting left non-dominant side: Secondary | ICD-10-CM | POA: Diagnosis not present

## 2022-08-26 DIAGNOSIS — F0150 Vascular dementia without behavioral disturbance: Secondary | ICD-10-CM | POA: Diagnosis not present

## 2022-08-28 DIAGNOSIS — J449 Chronic obstructive pulmonary disease, unspecified: Secondary | ICD-10-CM | POA: Diagnosis not present

## 2022-08-28 DIAGNOSIS — F0150 Vascular dementia without behavioral disturbance: Secondary | ICD-10-CM | POA: Diagnosis not present

## 2022-08-28 DIAGNOSIS — I69354 Hemiplegia and hemiparesis following cerebral infarction affecting left non-dominant side: Secondary | ICD-10-CM | POA: Diagnosis not present

## 2022-08-28 DIAGNOSIS — F09 Unspecified mental disorder due to known physiological condition: Secondary | ICD-10-CM | POA: Diagnosis not present

## 2022-08-28 DIAGNOSIS — I69318 Other symptoms and signs involving cognitive functions following cerebral infarction: Secondary | ICD-10-CM | POA: Diagnosis not present

## 2022-08-28 DIAGNOSIS — I129 Hypertensive chronic kidney disease with stage 1 through stage 4 chronic kidney disease, or unspecified chronic kidney disease: Secondary | ICD-10-CM | POA: Diagnosis not present

## 2022-08-28 LAB — CUP PACEART REMOTE DEVICE CHECK
Date Time Interrogation Session: 20231002231530
Implantable Pulse Generator Implant Date: 20210719

## 2022-09-02 ENCOUNTER — Ambulatory Visit (INDEPENDENT_AMBULATORY_CARE_PROVIDER_SITE_OTHER): Payer: Medicare Other

## 2022-09-02 DIAGNOSIS — F09 Unspecified mental disorder due to known physiological condition: Secondary | ICD-10-CM | POA: Diagnosis not present

## 2022-09-02 DIAGNOSIS — I639 Cerebral infarction, unspecified: Secondary | ICD-10-CM

## 2022-09-02 DIAGNOSIS — F0150 Vascular dementia without behavioral disturbance: Secondary | ICD-10-CM | POA: Diagnosis not present

## 2022-09-02 DIAGNOSIS — I69354 Hemiplegia and hemiparesis following cerebral infarction affecting left non-dominant side: Secondary | ICD-10-CM | POA: Diagnosis not present

## 2022-09-02 DIAGNOSIS — I129 Hypertensive chronic kidney disease with stage 1 through stage 4 chronic kidney disease, or unspecified chronic kidney disease: Secondary | ICD-10-CM | POA: Diagnosis not present

## 2022-09-02 DIAGNOSIS — J449 Chronic obstructive pulmonary disease, unspecified: Secondary | ICD-10-CM | POA: Diagnosis not present

## 2022-09-02 DIAGNOSIS — I69318 Other symptoms and signs involving cognitive functions following cerebral infarction: Secondary | ICD-10-CM | POA: Diagnosis not present

## 2022-09-03 DIAGNOSIS — F0150 Vascular dementia without behavioral disturbance: Secondary | ICD-10-CM | POA: Diagnosis not present

## 2022-09-03 DIAGNOSIS — I129 Hypertensive chronic kidney disease with stage 1 through stage 4 chronic kidney disease, or unspecified chronic kidney disease: Secondary | ICD-10-CM | POA: Diagnosis not present

## 2022-09-03 DIAGNOSIS — I69318 Other symptoms and signs involving cognitive functions following cerebral infarction: Secondary | ICD-10-CM | POA: Diagnosis not present

## 2022-09-03 DIAGNOSIS — F09 Unspecified mental disorder due to known physiological condition: Secondary | ICD-10-CM | POA: Diagnosis not present

## 2022-09-03 DIAGNOSIS — I69354 Hemiplegia and hemiparesis following cerebral infarction affecting left non-dominant side: Secondary | ICD-10-CM | POA: Diagnosis not present

## 2022-09-03 DIAGNOSIS — J449 Chronic obstructive pulmonary disease, unspecified: Secondary | ICD-10-CM | POA: Diagnosis not present

## 2022-09-06 DIAGNOSIS — I69318 Other symptoms and signs involving cognitive functions following cerebral infarction: Secondary | ICD-10-CM | POA: Diagnosis not present

## 2022-09-06 DIAGNOSIS — F0150 Vascular dementia without behavioral disturbance: Secondary | ICD-10-CM | POA: Diagnosis not present

## 2022-09-06 DIAGNOSIS — J449 Chronic obstructive pulmonary disease, unspecified: Secondary | ICD-10-CM | POA: Diagnosis not present

## 2022-09-06 DIAGNOSIS — I69354 Hemiplegia and hemiparesis following cerebral infarction affecting left non-dominant side: Secondary | ICD-10-CM | POA: Diagnosis not present

## 2022-09-06 DIAGNOSIS — F09 Unspecified mental disorder due to known physiological condition: Secondary | ICD-10-CM | POA: Diagnosis not present

## 2022-09-06 DIAGNOSIS — I129 Hypertensive chronic kidney disease with stage 1 through stage 4 chronic kidney disease, or unspecified chronic kidney disease: Secondary | ICD-10-CM | POA: Diagnosis not present

## 2022-09-09 DIAGNOSIS — F0150 Vascular dementia without behavioral disturbance: Secondary | ICD-10-CM | POA: Diagnosis not present

## 2022-09-09 DIAGNOSIS — I129 Hypertensive chronic kidney disease with stage 1 through stage 4 chronic kidney disease, or unspecified chronic kidney disease: Secondary | ICD-10-CM | POA: Diagnosis not present

## 2022-09-09 DIAGNOSIS — I69354 Hemiplegia and hemiparesis following cerebral infarction affecting left non-dominant side: Secondary | ICD-10-CM | POA: Diagnosis not present

## 2022-09-09 DIAGNOSIS — I69318 Other symptoms and signs involving cognitive functions following cerebral infarction: Secondary | ICD-10-CM | POA: Diagnosis not present

## 2022-09-09 DIAGNOSIS — N184 Chronic kidney disease, stage 4 (severe): Secondary | ICD-10-CM | POA: Diagnosis not present

## 2022-09-09 DIAGNOSIS — G5791 Unspecified mononeuropathy of right lower limb: Secondary | ICD-10-CM | POA: Diagnosis not present

## 2022-09-09 DIAGNOSIS — Z85118 Personal history of other malignant neoplasm of bronchus and lung: Secondary | ICD-10-CM | POA: Diagnosis not present

## 2022-09-09 DIAGNOSIS — E782 Mixed hyperlipidemia: Secondary | ICD-10-CM | POA: Diagnosis not present

## 2022-09-09 DIAGNOSIS — F32A Depression, unspecified: Secondary | ICD-10-CM | POA: Diagnosis not present

## 2022-09-09 DIAGNOSIS — H9190 Unspecified hearing loss, unspecified ear: Secondary | ICD-10-CM | POA: Diagnosis not present

## 2022-09-09 DIAGNOSIS — J4489 Other specified chronic obstructive pulmonary disease: Secondary | ICD-10-CM | POA: Diagnosis not present

## 2022-09-09 DIAGNOSIS — Z87891 Personal history of nicotine dependence: Secondary | ICD-10-CM | POA: Diagnosis not present

## 2022-09-09 DIAGNOSIS — Z96651 Presence of right artificial knee joint: Secondary | ICD-10-CM | POA: Diagnosis not present

## 2022-09-09 DIAGNOSIS — I4891 Unspecified atrial fibrillation: Secondary | ICD-10-CM | POA: Diagnosis not present

## 2022-09-09 DIAGNOSIS — Z9181 History of falling: Secondary | ICD-10-CM | POA: Diagnosis not present

## 2022-09-09 DIAGNOSIS — M1712 Unilateral primary osteoarthritis, left knee: Secondary | ICD-10-CM | POA: Diagnosis not present

## 2022-09-09 DIAGNOSIS — E039 Hypothyroidism, unspecified: Secondary | ICD-10-CM | POA: Diagnosis not present

## 2022-09-09 DIAGNOSIS — E559 Vitamin D deficiency, unspecified: Secondary | ICD-10-CM | POA: Diagnosis not present

## 2022-09-09 DIAGNOSIS — Z7982 Long term (current) use of aspirin: Secondary | ICD-10-CM | POA: Diagnosis not present

## 2022-09-09 DIAGNOSIS — Z90710 Acquired absence of both cervix and uterus: Secondary | ICD-10-CM | POA: Diagnosis not present

## 2022-09-11 NOTE — Progress Notes (Signed)
[facility] Summary Report / Loop Recorder 

## 2022-09-16 DIAGNOSIS — H9190 Unspecified hearing loss, unspecified ear: Secondary | ICD-10-CM | POA: Diagnosis not present

## 2022-09-16 DIAGNOSIS — I69318 Other symptoms and signs involving cognitive functions following cerebral infarction: Secondary | ICD-10-CM | POA: Diagnosis not present

## 2022-09-16 DIAGNOSIS — I129 Hypertensive chronic kidney disease with stage 1 through stage 4 chronic kidney disease, or unspecified chronic kidney disease: Secondary | ICD-10-CM | POA: Diagnosis not present

## 2022-09-16 DIAGNOSIS — I69354 Hemiplegia and hemiparesis following cerebral infarction affecting left non-dominant side: Secondary | ICD-10-CM | POA: Diagnosis not present

## 2022-09-16 DIAGNOSIS — J4489 Other specified chronic obstructive pulmonary disease: Secondary | ICD-10-CM | POA: Diagnosis not present

## 2022-09-16 DIAGNOSIS — F0150 Vascular dementia without behavioral disturbance: Secondary | ICD-10-CM | POA: Diagnosis not present

## 2022-09-23 DIAGNOSIS — I129 Hypertensive chronic kidney disease with stage 1 through stage 4 chronic kidney disease, or unspecified chronic kidney disease: Secondary | ICD-10-CM | POA: Diagnosis not present

## 2022-09-23 DIAGNOSIS — H9190 Unspecified hearing loss, unspecified ear: Secondary | ICD-10-CM | POA: Diagnosis not present

## 2022-09-23 DIAGNOSIS — I69354 Hemiplegia and hemiparesis following cerebral infarction affecting left non-dominant side: Secondary | ICD-10-CM | POA: Diagnosis not present

## 2022-09-23 DIAGNOSIS — I69318 Other symptoms and signs involving cognitive functions following cerebral infarction: Secondary | ICD-10-CM | POA: Diagnosis not present

## 2022-09-23 DIAGNOSIS — F0150 Vascular dementia without behavioral disturbance: Secondary | ICD-10-CM | POA: Diagnosis not present

## 2022-09-23 DIAGNOSIS — J4489 Other specified chronic obstructive pulmonary disease: Secondary | ICD-10-CM | POA: Diagnosis not present

## 2022-09-30 DIAGNOSIS — I69318 Other symptoms and signs involving cognitive functions following cerebral infarction: Secondary | ICD-10-CM | POA: Diagnosis not present

## 2022-09-30 DIAGNOSIS — I69354 Hemiplegia and hemiparesis following cerebral infarction affecting left non-dominant side: Secondary | ICD-10-CM | POA: Diagnosis not present

## 2022-09-30 DIAGNOSIS — I129 Hypertensive chronic kidney disease with stage 1 through stage 4 chronic kidney disease, or unspecified chronic kidney disease: Secondary | ICD-10-CM | POA: Diagnosis not present

## 2022-09-30 DIAGNOSIS — H9190 Unspecified hearing loss, unspecified ear: Secondary | ICD-10-CM | POA: Diagnosis not present

## 2022-09-30 DIAGNOSIS — J4489 Other specified chronic obstructive pulmonary disease: Secondary | ICD-10-CM | POA: Diagnosis not present

## 2022-09-30 DIAGNOSIS — F0150 Vascular dementia without behavioral disturbance: Secondary | ICD-10-CM | POA: Diagnosis not present

## 2022-10-03 DIAGNOSIS — I69354 Hemiplegia and hemiparesis following cerebral infarction affecting left non-dominant side: Secondary | ICD-10-CM | POA: Diagnosis not present

## 2022-10-03 DIAGNOSIS — J4489 Other specified chronic obstructive pulmonary disease: Secondary | ICD-10-CM | POA: Diagnosis not present

## 2022-10-03 DIAGNOSIS — I69318 Other symptoms and signs involving cognitive functions following cerebral infarction: Secondary | ICD-10-CM | POA: Diagnosis not present

## 2022-10-03 DIAGNOSIS — H9190 Unspecified hearing loss, unspecified ear: Secondary | ICD-10-CM | POA: Diagnosis not present

## 2022-10-03 DIAGNOSIS — F0150 Vascular dementia without behavioral disturbance: Secondary | ICD-10-CM | POA: Diagnosis not present

## 2022-10-03 DIAGNOSIS — I129 Hypertensive chronic kidney disease with stage 1 through stage 4 chronic kidney disease, or unspecified chronic kidney disease: Secondary | ICD-10-CM | POA: Diagnosis not present

## 2022-10-05 LAB — CUP PACEART REMOTE DEVICE CHECK
Date Time Interrogation Session: 20231104230601
Implantable Pulse Generator Implant Date: 20210719

## 2022-10-07 ENCOUNTER — Ambulatory Visit (INDEPENDENT_AMBULATORY_CARE_PROVIDER_SITE_OTHER): Payer: Medicare Other

## 2022-10-07 DIAGNOSIS — I639 Cerebral infarction, unspecified: Secondary | ICD-10-CM

## 2022-10-07 DIAGNOSIS — I69318 Other symptoms and signs involving cognitive functions following cerebral infarction: Secondary | ICD-10-CM | POA: Diagnosis not present

## 2022-10-07 DIAGNOSIS — J4489 Other specified chronic obstructive pulmonary disease: Secondary | ICD-10-CM | POA: Diagnosis not present

## 2022-10-07 DIAGNOSIS — F0150 Vascular dementia without behavioral disturbance: Secondary | ICD-10-CM | POA: Diagnosis not present

## 2022-10-07 DIAGNOSIS — H9190 Unspecified hearing loss, unspecified ear: Secondary | ICD-10-CM | POA: Diagnosis not present

## 2022-10-07 DIAGNOSIS — I129 Hypertensive chronic kidney disease with stage 1 through stage 4 chronic kidney disease, or unspecified chronic kidney disease: Secondary | ICD-10-CM | POA: Diagnosis not present

## 2022-10-07 DIAGNOSIS — I69354 Hemiplegia and hemiparesis following cerebral infarction affecting left non-dominant side: Secondary | ICD-10-CM | POA: Diagnosis not present

## 2022-10-09 DIAGNOSIS — F0150 Vascular dementia without behavioral disturbance: Secondary | ICD-10-CM | POA: Diagnosis not present

## 2022-10-09 DIAGNOSIS — Z7982 Long term (current) use of aspirin: Secondary | ICD-10-CM | POA: Diagnosis not present

## 2022-10-09 DIAGNOSIS — E039 Hypothyroidism, unspecified: Secondary | ICD-10-CM | POA: Diagnosis not present

## 2022-10-09 DIAGNOSIS — I69354 Hemiplegia and hemiparesis following cerebral infarction affecting left non-dominant side: Secondary | ICD-10-CM | POA: Diagnosis not present

## 2022-10-09 DIAGNOSIS — I69318 Other symptoms and signs involving cognitive functions following cerebral infarction: Secondary | ICD-10-CM | POA: Diagnosis not present

## 2022-10-09 DIAGNOSIS — M1712 Unilateral primary osteoarthritis, left knee: Secondary | ICD-10-CM | POA: Diagnosis not present

## 2022-10-09 DIAGNOSIS — Z96651 Presence of right artificial knee joint: Secondary | ICD-10-CM | POA: Diagnosis not present

## 2022-10-09 DIAGNOSIS — Z87891 Personal history of nicotine dependence: Secondary | ICD-10-CM | POA: Diagnosis not present

## 2022-10-09 DIAGNOSIS — I129 Hypertensive chronic kidney disease with stage 1 through stage 4 chronic kidney disease, or unspecified chronic kidney disease: Secondary | ICD-10-CM | POA: Diagnosis not present

## 2022-10-09 DIAGNOSIS — Z9181 History of falling: Secondary | ICD-10-CM | POA: Diagnosis not present

## 2022-10-09 DIAGNOSIS — E782 Mixed hyperlipidemia: Secondary | ICD-10-CM | POA: Diagnosis not present

## 2022-10-09 DIAGNOSIS — N184 Chronic kidney disease, stage 4 (severe): Secondary | ICD-10-CM | POA: Diagnosis not present

## 2022-10-09 DIAGNOSIS — G5791 Unspecified mononeuropathy of right lower limb: Secondary | ICD-10-CM | POA: Diagnosis not present

## 2022-10-09 DIAGNOSIS — J4489 Other specified chronic obstructive pulmonary disease: Secondary | ICD-10-CM | POA: Diagnosis not present

## 2022-10-09 DIAGNOSIS — E559 Vitamin D deficiency, unspecified: Secondary | ICD-10-CM | POA: Diagnosis not present

## 2022-10-09 DIAGNOSIS — I4891 Unspecified atrial fibrillation: Secondary | ICD-10-CM | POA: Diagnosis not present

## 2022-10-09 DIAGNOSIS — F32A Depression, unspecified: Secondary | ICD-10-CM | POA: Diagnosis not present

## 2022-10-09 DIAGNOSIS — H9190 Unspecified hearing loss, unspecified ear: Secondary | ICD-10-CM | POA: Diagnosis not present

## 2022-10-09 DIAGNOSIS — Z90710 Acquired absence of both cervix and uterus: Secondary | ICD-10-CM | POA: Diagnosis not present

## 2022-10-09 DIAGNOSIS — Z85118 Personal history of other malignant neoplasm of bronchus and lung: Secondary | ICD-10-CM | POA: Diagnosis not present

## 2022-10-10 DIAGNOSIS — H9190 Unspecified hearing loss, unspecified ear: Secondary | ICD-10-CM | POA: Diagnosis not present

## 2022-10-10 DIAGNOSIS — I129 Hypertensive chronic kidney disease with stage 1 through stage 4 chronic kidney disease, or unspecified chronic kidney disease: Secondary | ICD-10-CM | POA: Diagnosis not present

## 2022-10-10 DIAGNOSIS — J4489 Other specified chronic obstructive pulmonary disease: Secondary | ICD-10-CM | POA: Diagnosis not present

## 2022-10-10 DIAGNOSIS — F0150 Vascular dementia without behavioral disturbance: Secondary | ICD-10-CM | POA: Diagnosis not present

## 2022-10-10 DIAGNOSIS — I69354 Hemiplegia and hemiparesis following cerebral infarction affecting left non-dominant side: Secondary | ICD-10-CM | POA: Diagnosis not present

## 2022-10-10 DIAGNOSIS — I69318 Other symptoms and signs involving cognitive functions following cerebral infarction: Secondary | ICD-10-CM | POA: Diagnosis not present

## 2022-10-14 DIAGNOSIS — F0150 Vascular dementia without behavioral disturbance: Secondary | ICD-10-CM | POA: Diagnosis not present

## 2022-10-14 DIAGNOSIS — I129 Hypertensive chronic kidney disease with stage 1 through stage 4 chronic kidney disease, or unspecified chronic kidney disease: Secondary | ICD-10-CM | POA: Diagnosis not present

## 2022-10-14 DIAGNOSIS — J4489 Other specified chronic obstructive pulmonary disease: Secondary | ICD-10-CM | POA: Diagnosis not present

## 2022-10-14 DIAGNOSIS — H9190 Unspecified hearing loss, unspecified ear: Secondary | ICD-10-CM | POA: Diagnosis not present

## 2022-10-14 DIAGNOSIS — I69354 Hemiplegia and hemiparesis following cerebral infarction affecting left non-dominant side: Secondary | ICD-10-CM | POA: Diagnosis not present

## 2022-10-14 DIAGNOSIS — I69318 Other symptoms and signs involving cognitive functions following cerebral infarction: Secondary | ICD-10-CM | POA: Diagnosis not present

## 2022-10-22 DIAGNOSIS — F0150 Vascular dementia without behavioral disturbance: Secondary | ICD-10-CM | POA: Diagnosis not present

## 2022-10-22 DIAGNOSIS — H9190 Unspecified hearing loss, unspecified ear: Secondary | ICD-10-CM | POA: Diagnosis not present

## 2022-10-22 DIAGNOSIS — I129 Hypertensive chronic kidney disease with stage 1 through stage 4 chronic kidney disease, or unspecified chronic kidney disease: Secondary | ICD-10-CM | POA: Diagnosis not present

## 2022-10-22 DIAGNOSIS — I69354 Hemiplegia and hemiparesis following cerebral infarction affecting left non-dominant side: Secondary | ICD-10-CM | POA: Diagnosis not present

## 2022-10-22 DIAGNOSIS — J4489 Other specified chronic obstructive pulmonary disease: Secondary | ICD-10-CM | POA: Diagnosis not present

## 2022-10-22 DIAGNOSIS — I69318 Other symptoms and signs involving cognitive functions following cerebral infarction: Secondary | ICD-10-CM | POA: Diagnosis not present

## 2022-10-24 DIAGNOSIS — I129 Hypertensive chronic kidney disease with stage 1 through stage 4 chronic kidney disease, or unspecified chronic kidney disease: Secondary | ICD-10-CM | POA: Diagnosis not present

## 2022-10-24 DIAGNOSIS — I69354 Hemiplegia and hemiparesis following cerebral infarction affecting left non-dominant side: Secondary | ICD-10-CM | POA: Diagnosis not present

## 2022-10-24 DIAGNOSIS — J4489 Other specified chronic obstructive pulmonary disease: Secondary | ICD-10-CM | POA: Diagnosis not present

## 2022-10-24 DIAGNOSIS — F0150 Vascular dementia without behavioral disturbance: Secondary | ICD-10-CM | POA: Diagnosis not present

## 2022-10-24 DIAGNOSIS — H9190 Unspecified hearing loss, unspecified ear: Secondary | ICD-10-CM | POA: Diagnosis not present

## 2022-10-24 DIAGNOSIS — I69318 Other symptoms and signs involving cognitive functions following cerebral infarction: Secondary | ICD-10-CM | POA: Diagnosis not present

## 2022-10-30 DIAGNOSIS — J4489 Other specified chronic obstructive pulmonary disease: Secondary | ICD-10-CM | POA: Diagnosis not present

## 2022-10-30 DIAGNOSIS — I129 Hypertensive chronic kidney disease with stage 1 through stage 4 chronic kidney disease, or unspecified chronic kidney disease: Secondary | ICD-10-CM | POA: Diagnosis not present

## 2022-10-30 DIAGNOSIS — H9190 Unspecified hearing loss, unspecified ear: Secondary | ICD-10-CM | POA: Diagnosis not present

## 2022-10-30 DIAGNOSIS — I69318 Other symptoms and signs involving cognitive functions following cerebral infarction: Secondary | ICD-10-CM | POA: Diagnosis not present

## 2022-10-30 DIAGNOSIS — F0150 Vascular dementia without behavioral disturbance: Secondary | ICD-10-CM | POA: Diagnosis not present

## 2022-10-30 DIAGNOSIS — I69354 Hemiplegia and hemiparesis following cerebral infarction affecting left non-dominant side: Secondary | ICD-10-CM | POA: Diagnosis not present

## 2022-10-31 DIAGNOSIS — I69318 Other symptoms and signs involving cognitive functions following cerebral infarction: Secondary | ICD-10-CM | POA: Diagnosis not present

## 2022-10-31 DIAGNOSIS — H9190 Unspecified hearing loss, unspecified ear: Secondary | ICD-10-CM | POA: Diagnosis not present

## 2022-10-31 DIAGNOSIS — I69354 Hemiplegia and hemiparesis following cerebral infarction affecting left non-dominant side: Secondary | ICD-10-CM | POA: Diagnosis not present

## 2022-10-31 DIAGNOSIS — F0150 Vascular dementia without behavioral disturbance: Secondary | ICD-10-CM | POA: Diagnosis not present

## 2022-10-31 DIAGNOSIS — J4489 Other specified chronic obstructive pulmonary disease: Secondary | ICD-10-CM | POA: Diagnosis not present

## 2022-10-31 DIAGNOSIS — I129 Hypertensive chronic kidney disease with stage 1 through stage 4 chronic kidney disease, or unspecified chronic kidney disease: Secondary | ICD-10-CM | POA: Diagnosis not present

## 2022-11-06 DIAGNOSIS — I69318 Other symptoms and signs involving cognitive functions following cerebral infarction: Secondary | ICD-10-CM | POA: Diagnosis not present

## 2022-11-06 DIAGNOSIS — I129 Hypertensive chronic kidney disease with stage 1 through stage 4 chronic kidney disease, or unspecified chronic kidney disease: Secondary | ICD-10-CM | POA: Diagnosis not present

## 2022-11-06 DIAGNOSIS — F0150 Vascular dementia without behavioral disturbance: Secondary | ICD-10-CM | POA: Diagnosis not present

## 2022-11-06 DIAGNOSIS — I69354 Hemiplegia and hemiparesis following cerebral infarction affecting left non-dominant side: Secondary | ICD-10-CM | POA: Diagnosis not present

## 2022-11-06 DIAGNOSIS — H9190 Unspecified hearing loss, unspecified ear: Secondary | ICD-10-CM | POA: Diagnosis not present

## 2022-11-06 DIAGNOSIS — J4489 Other specified chronic obstructive pulmonary disease: Secondary | ICD-10-CM | POA: Diagnosis not present

## 2022-11-08 DIAGNOSIS — E782 Mixed hyperlipidemia: Secondary | ICD-10-CM | POA: Diagnosis not present

## 2022-11-08 DIAGNOSIS — Z90710 Acquired absence of both cervix and uterus: Secondary | ICD-10-CM | POA: Diagnosis not present

## 2022-11-08 DIAGNOSIS — G5791 Unspecified mononeuropathy of right lower limb: Secondary | ICD-10-CM | POA: Diagnosis not present

## 2022-11-08 DIAGNOSIS — F0150 Vascular dementia without behavioral disturbance: Secondary | ICD-10-CM | POA: Diagnosis not present

## 2022-11-08 DIAGNOSIS — I69354 Hemiplegia and hemiparesis following cerebral infarction affecting left non-dominant side: Secondary | ICD-10-CM | POA: Diagnosis not present

## 2022-11-08 DIAGNOSIS — Z87891 Personal history of nicotine dependence: Secondary | ICD-10-CM | POA: Diagnosis not present

## 2022-11-08 DIAGNOSIS — E559 Vitamin D deficiency, unspecified: Secondary | ICD-10-CM | POA: Diagnosis not present

## 2022-11-08 DIAGNOSIS — Z7982 Long term (current) use of aspirin: Secondary | ICD-10-CM | POA: Diagnosis not present

## 2022-11-08 DIAGNOSIS — J4489 Other specified chronic obstructive pulmonary disease: Secondary | ICD-10-CM | POA: Diagnosis not present

## 2022-11-08 DIAGNOSIS — I69318 Other symptoms and signs involving cognitive functions following cerebral infarction: Secondary | ICD-10-CM | POA: Diagnosis not present

## 2022-11-08 DIAGNOSIS — Z85118 Personal history of other malignant neoplasm of bronchus and lung: Secondary | ICD-10-CM | POA: Diagnosis not present

## 2022-11-08 DIAGNOSIS — I4891 Unspecified atrial fibrillation: Secondary | ICD-10-CM | POA: Diagnosis not present

## 2022-11-08 DIAGNOSIS — F32A Depression, unspecified: Secondary | ICD-10-CM | POA: Diagnosis not present

## 2022-11-08 DIAGNOSIS — N184 Chronic kidney disease, stage 4 (severe): Secondary | ICD-10-CM | POA: Diagnosis not present

## 2022-11-08 DIAGNOSIS — H9190 Unspecified hearing loss, unspecified ear: Secondary | ICD-10-CM | POA: Diagnosis not present

## 2022-11-08 DIAGNOSIS — I129 Hypertensive chronic kidney disease with stage 1 through stage 4 chronic kidney disease, or unspecified chronic kidney disease: Secondary | ICD-10-CM | POA: Diagnosis not present

## 2022-11-08 DIAGNOSIS — M1712 Unilateral primary osteoarthritis, left knee: Secondary | ICD-10-CM | POA: Diagnosis not present

## 2022-11-08 DIAGNOSIS — Z96651 Presence of right artificial knee joint: Secondary | ICD-10-CM | POA: Diagnosis not present

## 2022-11-08 DIAGNOSIS — E039 Hypothyroidism, unspecified: Secondary | ICD-10-CM | POA: Diagnosis not present

## 2022-11-08 DIAGNOSIS — Z9181 History of falling: Secondary | ICD-10-CM | POA: Diagnosis not present

## 2022-11-11 ENCOUNTER — Ambulatory Visit (INDEPENDENT_AMBULATORY_CARE_PROVIDER_SITE_OTHER): Payer: Medicare Other

## 2022-11-11 DIAGNOSIS — I639 Cerebral infarction, unspecified: Secondary | ICD-10-CM

## 2022-11-11 NOTE — Progress Notes (Signed)
[facility] Summary Report / Loop Recorder 

## 2022-11-12 LAB — CUP PACEART REMOTE DEVICE CHECK
Date Time Interrogation Session: 20231217230705
Implantable Pulse Generator Implant Date: 20210719

## 2022-11-13 DIAGNOSIS — J4489 Other specified chronic obstructive pulmonary disease: Secondary | ICD-10-CM | POA: Diagnosis not present

## 2022-11-13 DIAGNOSIS — F0150 Vascular dementia without behavioral disturbance: Secondary | ICD-10-CM | POA: Diagnosis not present

## 2022-11-13 DIAGNOSIS — I129 Hypertensive chronic kidney disease with stage 1 through stage 4 chronic kidney disease, or unspecified chronic kidney disease: Secondary | ICD-10-CM | POA: Diagnosis not present

## 2022-11-13 DIAGNOSIS — I69318 Other symptoms and signs involving cognitive functions following cerebral infarction: Secondary | ICD-10-CM | POA: Diagnosis not present

## 2022-11-13 DIAGNOSIS — H9190 Unspecified hearing loss, unspecified ear: Secondary | ICD-10-CM | POA: Diagnosis not present

## 2022-11-13 DIAGNOSIS — I69354 Hemiplegia and hemiparesis following cerebral infarction affecting left non-dominant side: Secondary | ICD-10-CM | POA: Diagnosis not present

## 2022-12-02 ENCOUNTER — Emergency Department (HOSPITAL_COMMUNITY): Payer: Medicare Other

## 2022-12-02 ENCOUNTER — Other Ambulatory Visit: Payer: Self-pay

## 2022-12-02 ENCOUNTER — Inpatient Hospital Stay (HOSPITAL_COMMUNITY)
Admission: EM | Admit: 2022-12-02 | Discharge: 2022-12-06 | DRG: 193 | Disposition: A | Discharge status: Home Health | Payer: Medicare Other | Source: Ambulatory Visit | Attending: Internal Medicine | Admitting: Internal Medicine

## 2022-12-02 DIAGNOSIS — Z882 Allergy status to sulfonamides status: Secondary | ICD-10-CM | POA: Diagnosis not present

## 2022-12-02 DIAGNOSIS — Z87891 Personal history of nicotine dependence: Secondary | ICD-10-CM | POA: Diagnosis not present

## 2022-12-02 DIAGNOSIS — H9190 Unspecified hearing loss, unspecified ear: Secondary | ICD-10-CM | POA: Diagnosis not present

## 2022-12-02 DIAGNOSIS — R059 Cough, unspecified: Secondary | ICD-10-CM | POA: Diagnosis not present

## 2022-12-02 DIAGNOSIS — Z90711 Acquired absence of uterus with remaining cervical stump: Secondary | ICD-10-CM | POA: Diagnosis not present

## 2022-12-02 DIAGNOSIS — J439 Emphysema, unspecified: Secondary | ICD-10-CM | POA: Diagnosis present

## 2022-12-02 DIAGNOSIS — I129 Hypertensive chronic kidney disease with stage 1 through stage 4 chronic kidney disease, or unspecified chronic kidney disease: Secondary | ICD-10-CM | POA: Diagnosis not present

## 2022-12-02 DIAGNOSIS — Z7189 Other specified counseling: Secondary | ICD-10-CM

## 2022-12-02 DIAGNOSIS — J168 Pneumonia due to other specified infectious organisms: Secondary | ICD-10-CM | POA: Diagnosis not present

## 2022-12-02 DIAGNOSIS — F0150 Vascular dementia without behavioral disturbance: Secondary | ICD-10-CM | POA: Diagnosis not present

## 2022-12-02 DIAGNOSIS — Z902 Acquired absence of lung [part of]: Secondary | ICD-10-CM | POA: Diagnosis not present

## 2022-12-02 DIAGNOSIS — E890 Postprocedural hypothyroidism: Secondary | ICD-10-CM | POA: Diagnosis present

## 2022-12-02 DIAGNOSIS — J4489 Other specified chronic obstructive pulmonary disease: Secondary | ICD-10-CM | POA: Diagnosis not present

## 2022-12-02 DIAGNOSIS — R051 Acute cough: Secondary | ICD-10-CM | POA: Diagnosis not present

## 2022-12-02 DIAGNOSIS — J189 Pneumonia, unspecified organism: Principal | ICD-10-CM

## 2022-12-02 DIAGNOSIS — R911 Solitary pulmonary nodule: Secondary | ICD-10-CM | POA: Diagnosis present

## 2022-12-02 DIAGNOSIS — J850 Gangrene and necrosis of lung: Secondary | ICD-10-CM | POA: Diagnosis not present

## 2022-12-02 DIAGNOSIS — R042 Hemoptysis: Secondary | ICD-10-CM | POA: Diagnosis present

## 2022-12-02 DIAGNOSIS — J9601 Acute respiratory failure with hypoxia: Secondary | ICD-10-CM | POA: Diagnosis not present

## 2022-12-02 DIAGNOSIS — Z85118 Personal history of other malignant neoplasm of bronchus and lung: Secondary | ICD-10-CM | POA: Diagnosis not present

## 2022-12-02 DIAGNOSIS — N1832 Chronic kidney disease, stage 3b: Secondary | ICD-10-CM | POA: Diagnosis present

## 2022-12-02 DIAGNOSIS — E785 Hyperlipidemia, unspecified: Secondary | ICD-10-CM | POA: Diagnosis present

## 2022-12-02 DIAGNOSIS — R0602 Shortness of breath: Secondary | ICD-10-CM | POA: Diagnosis not present

## 2022-12-02 DIAGNOSIS — Z79890 Hormone replacement therapy (postmenopausal): Secondary | ICD-10-CM | POA: Diagnosis not present

## 2022-12-02 DIAGNOSIS — Z923 Personal history of irradiation: Secondary | ICD-10-CM | POA: Diagnosis not present

## 2022-12-02 DIAGNOSIS — Z8673 Personal history of transient ischemic attack (TIA), and cerebral infarction without residual deficits: Secondary | ICD-10-CM | POA: Diagnosis not present

## 2022-12-02 DIAGNOSIS — Z888 Allergy status to other drugs, medicaments and biological substances status: Secondary | ICD-10-CM

## 2022-12-02 DIAGNOSIS — Z823 Family history of stroke: Secondary | ICD-10-CM | POA: Diagnosis not present

## 2022-12-02 DIAGNOSIS — I69318 Other symptoms and signs involving cognitive functions following cerebral infarction: Secondary | ICD-10-CM | POA: Diagnosis not present

## 2022-12-02 DIAGNOSIS — I69354 Hemiplegia and hemiparesis following cerebral infarction affecting left non-dominant side: Secondary | ICD-10-CM | POA: Diagnosis not present

## 2022-12-02 DIAGNOSIS — J122 Parainfluenza virus pneumonia: Principal | ICD-10-CM | POA: Diagnosis present

## 2022-12-02 DIAGNOSIS — D631 Anemia in chronic kidney disease: Secondary | ICD-10-CM | POA: Diagnosis present

## 2022-12-02 DIAGNOSIS — Z79899 Other long term (current) drug therapy: Secondary | ICD-10-CM

## 2022-12-02 DIAGNOSIS — Z1152 Encounter for screening for COVID-19: Secondary | ICD-10-CM

## 2022-12-02 DIAGNOSIS — Z96651 Presence of right artificial knee joint: Secondary | ICD-10-CM | POA: Diagnosis present

## 2022-12-02 LAB — COMPREHENSIVE METABOLIC PANEL
ALT: 13 U/L (ref 0–44)
AST: 22 U/L (ref 15–41)
Albumin: 3.5 g/dL (ref 3.5–5.0)
Alkaline Phosphatase: 87 U/L (ref 38–126)
Anion gap: 8 (ref 5–15)
BUN: 24 mg/dL — ABNORMAL HIGH (ref 8–23)
CO2: 27 mmol/L (ref 22–32)
Calcium: 9.1 mg/dL (ref 8.9–10.3)
Chloride: 102 mmol/L (ref 98–111)
Creatinine, Ser: 1.46 mg/dL — ABNORMAL HIGH (ref 0.44–1.00)
GFR, Estimated: 34 mL/min — ABNORMAL LOW (ref >60)
Glucose, Bld: 133 mg/dL — ABNORMAL HIGH (ref 70–99)
Potassium: 4.4 mmol/L (ref 3.5–5.1)
Sodium: 137 mmol/L (ref 135–145)
Total Bilirubin: 0.6 mg/dL (ref 0.3–1.2)
Total Protein: 7.3 g/dL (ref 6.5–8.1)

## 2022-12-02 LAB — I-STAT VENOUS BLOOD GAS, ED
Acid-Base Excess: 4 mmol/L — ABNORMAL HIGH (ref 0.0–2.0)
Bicarbonate: 29.5 mmol/L — ABNORMAL HIGH (ref 20.0–28.0)
Calcium, Ion: 1.19 mmol/L (ref 1.15–1.40)
HCT: 38 % (ref 36.0–46.0)
Hemoglobin: 12.9 g/dL (ref 12.0–15.0)
O2 Saturation: 54 %
Potassium: 5.7 mmol/L — ABNORMAL HIGH (ref 3.5–5.1)
Sodium: 138 mmol/L (ref 135–145)
TCO2: 31 mmol/L (ref 22–32)
pCO2, Ven: 49.5 mmHg (ref 44–60)
pH, Ven: 7.384 (ref 7.25–7.43)
pO2, Ven: 29 mmHg — CL (ref 32–45)

## 2022-12-02 LAB — CBC
HCT: 38.5 % (ref 36.0–46.0)
Hemoglobin: 12.2 g/dL (ref 12.0–15.0)
MCH: 31.3 pg (ref 26.0–34.0)
MCHC: 31.7 g/dL (ref 30.0–36.0)
MCV: 98.7 fL (ref 80.0–100.0)
Platelets: 272 10*3/uL (ref 150–400)
RBC: 3.9 MIL/uL (ref 3.87–5.11)
RDW: 13.6 % (ref 11.5–15.5)
WBC: 15.8 10*3/uL — ABNORMAL HIGH (ref 4.0–10.5)
nRBC: 0 % (ref 0.0–0.2)

## 2022-12-02 LAB — I-STAT CHEM 8, ED
BUN: 27 mg/dL — ABNORMAL HIGH (ref 8–23)
Calcium, Ion: 1.23 mmol/L (ref 1.15–1.40)
Chloride: 103 mmol/L (ref 98–111)
Creatinine, Ser: 1.5 mg/dL — ABNORMAL HIGH (ref 0.44–1.00)
Glucose, Bld: 133 mg/dL — ABNORMAL HIGH (ref 70–99)
HCT: 40 % (ref 36.0–46.0)
Hemoglobin: 13.6 g/dL (ref 12.0–15.0)
Potassium: 5.1 mmol/L (ref 3.5–5.1)
Sodium: 140 mmol/L (ref 135–145)
TCO2: 26 mmol/L (ref 22–32)

## 2022-12-02 LAB — LACTIC ACID, PLASMA: Lactic Acid, Venous: 1.7 mmol/L (ref 0.5–1.9)

## 2022-12-02 MED ORDER — SODIUM CHLORIDE 0.9 % IV SOLN
1.0000 g | Freq: Once | INTRAVENOUS | Status: AC
  Administered 2022-12-03: 1 g via INTRAVENOUS
  Filled 2022-12-02: qty 10

## 2022-12-02 MED ORDER — AZITHROMYCIN 250 MG PO TABS
500.0000 mg | ORAL_TABLET | Freq: Once | ORAL | Status: AC
  Administered 2022-12-03: 500 mg via ORAL
  Filled 2022-12-02: qty 2

## 2022-12-02 MED ORDER — IPRATROPIUM-ALBUTEROL 0.5-2.5 (3) MG/3ML IN SOLN
3.0000 mL | Freq: Once | RESPIRATORY_TRACT | Status: AC
  Administered 2022-12-03: 3 mL via RESPIRATORY_TRACT
  Filled 2022-12-02: qty 3

## 2022-12-02 NOTE — ED Provider Notes (Incomplete)
[facility] EMERGENCY DEPARTMENT Provider Note   CSN: [id number] Arrival date & time: 12/02/22  2012     History {Add pertinent medical, surgical, social history, OB history to HPI:1} Chief Complaint  Patient presents with  . Pneumonia    Valerie Young is a 89 y.o. female.   Pneumonia         Home Medications Prior to Admission medications   Medication Sig Start Date End Date Taking? Authorizing Provider  acetaminophen (TYLENOL) 325 MG tablet Take 650 mg by mouth 3 (three) times daily. TWO TO THREE TIMES A DAY    [provider]  albuterol (PROVENTIL HFA;VENTOLIN HFA) 108 (90 Base) MCG/ACT inhaler Inhale 2 puffs into the lungs every 6 (six) hours as needed for wheezing or shortness of breath. 10/15/18   Collene Gobble, MD  amLODipine (NORVASC) 5 MG tablet TAKE 1 TABLET (5 MG TOTAL) BY MOUTH AT BEDTIME. Patient taking differently: Take 5 mg by mouth daily.     Tresa Garter, MD  atenolol (TENORMIN) 50 MG tablet Take 1 tablet (50 mg total) by mouth daily. 01/06/14   Tresa Garter, MD  B Complex Vitamins (B COMPLEX 100 PO) Take 1 tablet by mouth daily.    [provider]  cetirizine (ZYRTEC) 10 MG tablet TAKE 1 TABLET (10 MG TOTAL) BY MOUTH AT BEDTIME. Patient taking differently: Take 10 mg by mouth daily.  11/06/15   Collene Gobble, MD  Cholecalciferol (VITAMIN D-3) 1000 units CAPS Take 3,000 Units by mouth daily.    [provider]  guaiFENesin (MUCINEX) 600 MG 12 hr tablet Take 2 tablets (1,200 mg total) by mouth 2 (two) times daily. 10/06/15   Hosie Poisson, MD  levothyroxine (SYNTHROID, LEVOTHROID) 50 MCG tablet Take 50 mcg by mouth daily before breakfast.  09/12/15   [provider]  polyethylene glycol (MIRALAX / GLYCOLAX) 17 g packet Take 17 g by mouth daily as needed for mild constipation. 06/12/20   Raiford Noble Latif, DO  polyvinyl alcohol (LIQUIFILM TEARS) 1.4 % ophthalmic solution Place 1 drop into  both eyes as needed for dry eyes.    [provider]  pravastatin (PRAVACHOL) 40 MG tablet Take 40 mg by mouth daily. 07/16/15   [provider]      Allergies    Sulfamethoxazole-trimethoprim and Tramadol hcl    Review of Systems   Review of Systems  Physical Exam Updated Vital Signs BP (!) 154/112   Pulse 96   Temp 98.8 F (37.1 C) (Oral)   Resp (!) 32   Ht 5\' 4"  (1.626 m)   Wt 45.4 kg   SpO2 95%   BMI 17.16 kg/m  Physical Exam  ED Results / Procedures / Treatments   Labs (all labs ordered are listed, but only abnormal results are displayed) Labs Reviewed  I-STAT CHEM 8, ED - Abnormal; Notable for the following components:      Result Value   BUN 27 (*)    Creatinine, Ser 1.50 (*)    Glucose, Bld 133 (*)    All other components within normal limits  I-STAT VENOUS BLOOD GAS, ED - Abnormal; Notable for the following components:   pO2, Ven 29 (*)    Bicarbonate 29.5 (*)    Acid-Base Excess 4.0 (*)    Potassium 5.7 (*)    All other components within normal limits  RESP PANEL BY RT-PCR (RSV, FLU A&B, COVID)  RVPGX2  CULTURE, BLOOD (ROUTINE X 2)  CULTURE, BLOOD (ROUTINE X 2)  COMPREHENSIVE METABOLIC PANEL  CBC  LACTIC ACID, PLASMA  LACTIC ACID, PLASMA    EKG None  Radiology No results found.  Procedures Procedures  {Document cardiac monitor, telemetry assessment procedure when appropriate:1}  Medications Ordered in ED Medications  ipratropium-albuterol (DUONEB) 0.5-2.5 (3) MG/3ML nebulizer solution 3 mL (has no administration in time range)  cefTRIAXone (ROCEPHIN) 1 g in sodium chloride 0.9 % 100 mL IVPB (has no administration in time range)  azithromycin (ZITHROMAX) tablet 500 mg (has no administration in time range)    ED Course/ Medical Decision Making/ A&P                           Medical Decision Making Amount and/or Complexity of Data Reviewed Labs: ordered.  Risk Prescription drug management.   ***  {Document critical  care time when appropriate:1} {Document review of labs and clinical decision tools ie heart score, Chads2Vasc2 etc:1}  {Document your independent review of radiology images, and any outside records:1} {Document your discussion with family members, caretakers, and with consultants:1} {Document social determinants of health affecting pt's care:1} {Document your decision making why or why not admission, treatments were needed:1} Final Clinical Impression(s) / ED Diagnoses Final diagnoses:  None    Rx / DC Orders ED Discharge Orders     None

## 2022-12-02 NOTE — ED Provider Triage Note (Cosign Needed Addendum)
  Emergency Medicine Provider Triage Evaluation Note  MRN:  [id number]  Arrival date & time: 12/02/22    Medically screening exam initiated at 10:11 PM.   CC:   Pneumonia   HPI:  Valerie Young is a 89 y.o. year-old female presents to the ED with chief complaint of SOB, cough, hemoptysis.  Seen at [facility] and sent to ED for assessment.  Hx of lung CA, COPD.  History provided by patient. ROS:  -As included in HPI PE:   Vitals:   12/02/22 2127  BP: (!) 159/71  Pulse: 84  Resp: 16  Temp: 98.8 F (37.1 C)  SpO2: 95%    Non-toxic appearing Mild respiratory distress Increased WOB, speaks in 3-4 word phrases wheezing MDM:  Based on signs and symptoms, pneumonia is highest on my differential, followed by COPD exacerbation. I've ordered labs, imaging, and neb in triage to expedite lab/diagnostic workup.  Patient was informed that the remainder of the evaluation will be completed by another provider, this initial triage assessment does not replace that evaluation, and the importance of remaining in the ED until their evaluation is complete.    Montine Circle, PA-C 12/02/22 2213    Montine Circle, PA-C 12/02/22 2231

## 2022-12-02 NOTE — ED Provider Notes (Signed)
[facility] EMERGENCY DEPARTMENT Provider Note   CSN: [id number] Arrival date & time: 12/02/22  2012     History  Chief Complaint  Patient presents with   Pneumonia    Valerie Young is a 89 y.o. female.   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  She states she feels short of breath.  She has not had any chest pain.  No nausea or vomiting.  No lightheadedness or dizziness.      Home Medications Prior to Admission medications   Medication Sig Start Date End Date Taking? Authorizing Provider  acetaminophen (TYLENOL) 325 MG tablet Take 650 mg by mouth 3 (three) times daily. TWO TO THREE TIMES A DAY    [provider]  albuterol (PROVENTIL HFA;VENTOLIN HFA) 108 (90 Base) MCG/ACT inhaler Inhale 2 puffs into the lungs every 6 (six) hours as needed for wheezing or shortness of breath. 10/15/18   Collene Gobble, MD  amLODipine (NORVASC) 5 MG tablet TAKE 1 TABLET (5 MG TOTAL) BY MOUTH AT BEDTIME. Patient taking differently: Take 5 mg by mouth daily.     Tresa Garter, MD  atenolol (TENORMIN) 50 MG tablet Take 1 tablet (50 mg total) by mouth daily. 01/06/14   Tresa Garter, MD  B Complex Vitamins (B COMPLEX 100 PO) Take 1 tablet by mouth daily.    [provider]  cetirizine (ZYRTEC) 10 MG tablet TAKE 1 TABLET (10 MG TOTAL) BY MOUTH AT BEDTIME. Patient taking differently: Take 10 mg by mouth daily.  11/06/15   Collene Gobble, MD  Cholecalciferol (VITAMIN D-3) 1000 units CAPS Take  3,000 Units by mouth daily.    [provider]  guaiFENesin (MUCINEX) 600 MG 12 hr tablet Take 2 tablets (1,200 mg total) by mouth 2 (two) times daily. 10/06/15   Hosie Poisson, MD  levothyroxine (SYNTHROID, LEVOTHROID) 50 MCG tablet Take 50 mcg by mouth daily before breakfast.  09/12/15   [provider]  polyethylene glycol (MIRALAX / GLYCOLAX) 17 g packet Take 17 g by mouth daily as needed for mild constipation. 06/12/20   Raiford Noble Latif, DO  polyvinyl alcohol (LIQUIFILM TEARS) 1.4 % ophthalmic solution Place 1 drop into both eyes as needed for dry eyes.    [provider]  pravastatin (PRAVACHOL) 40 MG tablet Take 40 mg by mouth daily. 07/16/15   [provider]      Allergies    Sulfamethoxazole-trimethoprim and Tramadol hcl    Review of Systems   Review of Systems  Physical Exam Updated Vital Signs BP 102/88   Pulse 96   Temp 98.9 F (37.2 C) (Oral)   Resp 20   Ht 5\' 4"  (1.626 m)   Wt 45.4 kg   SpO2 97%   BMI 17.16 kg/m  Physical Exam Vitals and nursing note reviewed.  Constitutional:      General: She is not in acute distress.    Appearance: She is ill-appearing.  HENT:     Head: Normocephalic and atraumatic.  Nose: Nose normal.     Mouth/Throat:     Mouth: Mucous membranes are dry.  Eyes:     General: No scleral icterus. Cardiovascular:     Rate and Rhythm: Normal rate and regular rhythm.     Pulses: Normal pulses.     Heart sounds: Normal heart sounds.  Pulmonary:     Effort: Pulmonary effort is normal. No respiratory distress.     Breath sounds: No wheezing.     Comments: Tachypnea, speaking in full sentences, faint expiratory wheeze.  Faint crackles in L base Abdominal:     Palpations: Abdomen is soft.     Tenderness: There is no abdominal tenderness. There is no guarding or rebound.  Musculoskeletal:     Cervical back: Normal range of motion.     Right lower leg: No edema.     Left lower leg: No edema.   Skin:    General: Skin is warm and dry.     Capillary Refill: Capillary refill takes less than 2 seconds.  Neurological:     Mental Status: She is alert. Mental status is at baseline.  Psychiatric:        Mood and Affect: Mood normal.        Behavior: Behavior normal.     ED Results / Procedures / Treatments   Labs (all labs ordered are listed, but only abnormal results are displayed) Labs Reviewed  COMPREHENSIVE METABOLIC PANEL - Abnormal; Notable for the following components:      Result Value   Glucose, Bld 133 (*)    BUN 24 (*)    Creatinine, Ser 1.46 (*)    GFR, Estimated 34 (*)    All other components within normal limits  CBC - Abnormal; Notable for the following components:   WBC 15.8 (*)    All other components within normal limits  I-STAT CHEM 8, ED - Abnormal; Notable for the following components:   BUN 27 (*)    Creatinine, Ser 1.50 (*)    Glucose, Bld 133 (*)    All other components within normal limits  I-STAT VENOUS BLOOD GAS, ED - Abnormal; Notable for the following components:   pO2, Ven 29 (*)    Bicarbonate 29.5 (*)    Acid-Base Excess 4.0 (*)    Potassium 5.7 (*)    All other components within normal limits  RESP PANEL BY RT-PCR (RSV, FLU A&B, COVID)  RVPGX2  CULTURE, BLOOD (ROUTINE X 2)  CULTURE, BLOOD (ROUTINE X 2)  RESPIRATORY PANEL BY PCR  CULTURE, BLOOD (ROUTINE X 2)  CULTURE, BLOOD (ROUTINE X 2)  LACTIC ACID, PLASMA  LACTIC ACID, PLASMA  HIV ANTIBODY (ROUTINE TESTING W REFLEX)  CBC  MAGNESIUM  COMPREHENSIVE METABOLIC PANEL  PHOSPHORUS  CBC WITH DIFFERENTIAL/PLATELET  PROTIME-INR  LEGIONELLA PNEUMOPHILA SEROGP 1 UR AG  CBC WITH DIFFERENTIAL/PLATELET    EKG None  Radiology CT Angio Chest PE W/Cm &/Or Wo Cm  Result Date: 12/03/2022 CLINICAL DATA:  89 year old with history of COPD and lung cancer, presents with shortness of breath and cough with hemoptysis. Pulmonary embolism suspected. EXAM: CT ANGIOGRAPHY CHEST WITH CONTRAST  TECHNIQUE: Multidetector CT imaging of the chest was performed using the standard protocol during bolus administration of intravenous contrast. Multiplanar CT image reconstructions and MIPs were obtained to evaluate the vascular anatomy. RADIATION DOSE REDUCTION: This exam was performed according to the departmental dose-optimization program which includes automated exposure control, adjustment of the mA and/or kV according to patient size and/or use of  iterative reconstruction technique. CONTRAST:  57mL OMNIPAQUE IOHEXOL 350 MG/ML SOLN COMPARISON:  PA and lateral 12/02/2022, chest radiograph 06/12/2020, CTA chest 06/09/2020 FINDINGS: Cardiovascular: There is no appreciable arterial embolus or dilatation. Left upper lobe arteries are poorly opacified as before, with chronic left upper lobe collapse again suspected post treatment related and with a small chronic loculated left apical pleural effusion. There is mild cardiomegaly. There is patchy calcification in the circumflex and LAD coronary arteries and no pericardial effusion. There is moderate mixed plaque in the aorta, proximal great vessels. The pulmonary veins are decompressed. No aortic aneurysm or dissection is seen and no significant aortic or great vessel stenosis. Mediastinum/Nodes: No intrathoracic or axillary adenopathy is seen. No filling defect in the trachea and main bronchi. There is no esophageal thickening. Thyroid gland is atrophic without mass. Lungs/Pleura: Remote right upper lobe hypertrophy. Chronic collapse again noted in the left upper lobe with chronic loculated left apical pleural fluid and with left-sided chronic volume loss, again suspected treatment related. There are emphysematous and scarring changes of the lungs. No new pleural effusion is seen. No pneumothorax. There are increased patchy alveolar infiltrates in the left mid to lower lung field most likely due to interval new pneumonia. Some of the consolidation demonstrates  increased cystic changes in the parenchyma which could indicate a necrotizing pneumonic component. On the right, there is increased patchy ground-glass opacity in the basal segments of the lower lobe. This likely indicates additional areas of pneumonitis. Posteromedially in the right upper lobe, there is a paraspinal pleural-based 2.1 x 1.5 cm nodule which previously had a more ground-glass character and is concerning for neoplasm, although it is not significantly changed in overall size. PET-CT evaluation is recommended. There are coarse scarring changes in the right apex. No other significant or new pulmonary opacities are seen. Upper Abdomen: Not well seen due to breathing motion and the patient's overlying left arm creating streak artifacts. No obvious acute changes. Musculoskeletal: There is osteopenia with mild degenerative change in the thoracic spine. No destructive bone lesion is seen. No chest wall mass. Review of the MIP images confirms the above findings. IMPRESSION: 1. No evidence of arterial embolus, with suboptimal depiction of left upper lobe arteries due to chronic left upper lobe collapse. 2. Chronic left upper lobe collapse with chronic loculated left apical pleural effusion. Old right upper lobectomy. 3. Interval development of patchy alveolar infiltrates in the left mid to lower lung field, with increased cystic changes in the parenchyma which could indicate a necrotizing pneumonic component. 4. Increased patchy ground-glass opacity in the basal segments of the right lower lobe, likely additional areas of pneumonitis. 5. 2.1 x 1.5 cm pleural-based nodule in the posteromedial right upper lobe, previously ground-glass in character and concerning for neoplasm. PET-CT recommended. 6. Aortic and coronary artery atherosclerosis.  Mild cardiomegaly. Electronically Signed   By: Telford Nab M.D.   On: 12/03/2022 00:59    Procedures Procedures    Medications Ordered in ED Medications  heparin  injection 5,000 Units (has no administration in time range)  0.9 %  sodium chloride infusion (has no administration in time range)  cefTRIAXone (ROCEPHIN) 2 g in sodium chloride 0.9 % 100 mL IVPB (has no administration in time range)  azithromycin (ZITHROMAX) 500 mg in sodium chloride 0.9 % 250 mL IVPB (has no administration in time range)  ipratropium-albuterol (DUONEB) 0.5-2.5 (3) MG/3ML nebulizer solution 3 mL (3 mLs Nebulization Given 12/03/22 0033)  cefTRIAXone (ROCEPHIN) 1 g in sodium chloride 0.9 %  100 mL IVPB (0 g Intravenous Stopped 12/03/22 0151)  azithromycin (ZITHROMAX) tablet 500 mg (500 mg Oral Given 12/03/22 0114)  iohexol (OMNIPAQUE) 350 MG/ML injection 60 mL (60 mLs Intravenous Contrast Given 12/03/22 0005)  acetaminophen (TYLENOL) tablet 1,000 mg (1,000 mg Oral Given 12/03/22 0152)  lactated ringers bolus 1,000 mL (0 mLs Intravenous Stopped 12/03/22 0440)    ED Course/ Medical Decision Making/ A&P Clinical Course as of 12/03/22 0619  Tue Dec 03, 2022  0138 IMPRESSION: 1. No evidence of arterial embolus, with suboptimal depiction of left upper lobe arteries due to chronic left upper lobe collapse. 2. Chronic left upper lobe collapse with chronic loculated left apical pleural effusion. Old right upper lobectomy. 3. Interval development of patchy alveolar infiltrates in the left mid to lower lung field, with increased cystic changes in the parenchyma which could indicate a necrotizing pneumonic component. 4. Increased patchy ground-glass opacity in the basal segments of the right lower lobe, likely additional areas of pneumonitis. 5. 2.1 x 1.5 cm pleural-based nodule in the posteromedial right upper lobe, previously ground-glass in character and concerning for neoplasm. PET-CT recommended. 6. Aortic and coronary artery atherosclerosis.  Mild cardiomegaly.   Electronically Signed   By: Telford Nab M.D.   On: 12/03/2022 00:59   [WF]    Clinical Course User Index [WF] Tedd Sias, PA                           Medical Decision Making Amount and/or Complexity of Data Reviewed Labs: ordered. Radiology: ordered.  Risk OTC drugs. Prescription drug management. Decision regarding hospitalization.   This patient presents to the ED for concern of shortness of breath, this involves a number of treatment options, and is a complaint that carries with it a moderate to high risk of complications and morbidity. A differential diagnosis was considered for the patient's symptoms which is discussed below:   The causes for shortness of breath include but are not limited to Cardiac (AHF, pericardial effusion and tamponade, arrhythmias, ischemia, etc) Respiratory (COPD, asthma, pneumonia, pneumothorax, primary pulmonary hypertension, PE/VQ mismatch) Hematological (anemia) Neuromuscular (ALS, Guillain-Barr, etc)    Co morbidities: Discussed in HPI   Brief History:  Patient is a 89 year old female with past medical history significant for COPD, lung cancer status post lobectomy for adenocarcinoma of lung, CKD, anemia of chronic disease, 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  She states she feels short of breath.  She has not had any chest pain.  No nausea or vomiting.  No lightheadedness or dizziness.    EMR reviewed including pt PMHx, past surgical history and past visits to ER.   See HPI for more details   Lab Tests:   I ordered and independently interpreted labs. Labs notable for Leukocytosis of 15.8, CMP with no electrolyte derangement.  Blood cultures obtained, lactate within normal limits x 1.  VBG without acidosis or alkalosis.  Imaging Studies:  Abnormal findings. I  personally reviewed all imaging studies. Imaging notable for IMPRESSION: 1. No evidence of arterial embolus, with suboptimal depiction of left upper lobe arteries due to chronic left upper lobe collapse. 2. Chronic left upper lobe collapse  with chronic loculated left apical pleural effusion. Old right upper lobectomy. 3. Interval development of patchy alveolar infiltrates in the left mid to lower lung field, with increased cystic changes in the parenchyma which could indicate a necrotizing pneumonic component. 4. Increased patchy ground-glass opacity in the basal segments of the right lower lobe, likely additional areas of pneumonitis. 5. 2.1 x 1.5 cm pleural-based nodule in the posteromedial right upper lobe, previously ground-glass in character and concerning for neoplasm. PET-CT recommended. 6. Aortic and coronary artery atherosclerosis.  Mild cardiomegaly.   Electronically Signed   By: Telford Nab M.D.   On: 12/03/2022 00:59     Cardiac Monitoring:  The patient was maintained on a cardiac monitor.  I personally viewed and interpreted the cardiac monitored which showed an underlying rhythm of: sinus EKG non-ischemic   Medicines ordered:  I ordered medication including Rocephin, azithromycin, DuoNeb x 1, lactated Ringer's, 1 g of Tylenol for hydration, pneumonia Reevaluation of the patient after these medicines showed that the patient improved I have reviewed the patients home medicines and have made adjustments as needed   Critical Interventions:     Consults/Attending Physician   I requested consultation with Dr. Marcello Moores,  and discussed lab and imaging findings as well as pertinent plan - they recommend: they will admit   Reevaluation:  After the interventions noted above I re-evaluated patient and found that they have :improved   Social Determinants of Health:      Problem List / ED Course:  Pneumonia with tachypnea and mild tachycardia.  Much  improved now with fluids Tylenol and antibiotics.  She was wheezing but has had resolution of this on my reexamination.  Seems to be significantly less weak and somewhat less confused than she was previously.  She is alert and oriented and following commands well.  Was on oxygen initially but weaned to room air.   Dispostion:  After consideration of the diagnostic results and the patients response to treatment, I feel that the patent would benefit from admission.   Final Clinical Impression(s) / ED Diagnoses Final diagnoses:  Pneumonia due to infectious organism, unspecified laterality, unspecified part of lung    Rx / DC Orders ED Discharge Orders     None         Tedd Sias, [place] 12/03/22 5790    Merryl Hacker, MD 12/03/22 [phone]

## 2022-12-02 NOTE — ED Triage Notes (Signed)
Pt sent by PCP to the ED for pneumonia. Pt states she has had cough with blood tinged sputum and ShOB for a "couple of days."

## 2022-12-03 ENCOUNTER — Encounter (HOSPITAL_COMMUNITY): Payer: Self-pay | Admitting: Internal Medicine

## 2022-12-03 DIAGNOSIS — J122 Parainfluenza virus pneumonia: Secondary | ICD-10-CM | POA: Diagnosis present

## 2022-12-03 DIAGNOSIS — E890 Postprocedural hypothyroidism: Secondary | ICD-10-CM | POA: Diagnosis present

## 2022-12-03 DIAGNOSIS — J9601 Acute respiratory failure with hypoxia: Secondary | ICD-10-CM | POA: Diagnosis not present

## 2022-12-03 DIAGNOSIS — J850 Gangrene and necrosis of lung: Secondary | ICD-10-CM | POA: Diagnosis present

## 2022-12-03 DIAGNOSIS — Z79890 Hormone replacement therapy (postmenopausal): Secondary | ICD-10-CM | POA: Diagnosis not present

## 2022-12-03 DIAGNOSIS — Z90711 Acquired absence of uterus with remaining cervical stump: Secondary | ICD-10-CM | POA: Diagnosis not present

## 2022-12-03 DIAGNOSIS — Z85118 Personal history of other malignant neoplasm of bronchus and lung: Secondary | ICD-10-CM | POA: Diagnosis not present

## 2022-12-03 DIAGNOSIS — R059 Cough, unspecified: Secondary | ICD-10-CM | POA: Diagnosis not present

## 2022-12-03 DIAGNOSIS — J189 Pneumonia, unspecified organism: Secondary | ICD-10-CM | POA: Diagnosis not present

## 2022-12-03 DIAGNOSIS — Z823 Family history of stroke: Secondary | ICD-10-CM | POA: Diagnosis not present

## 2022-12-03 DIAGNOSIS — D631 Anemia in chronic kidney disease: Secondary | ICD-10-CM | POA: Diagnosis present

## 2022-12-03 DIAGNOSIS — Z882 Allergy status to sulfonamides status: Secondary | ICD-10-CM | POA: Diagnosis not present

## 2022-12-03 DIAGNOSIS — Z923 Personal history of irradiation: Secondary | ICD-10-CM | POA: Diagnosis not present

## 2022-12-03 DIAGNOSIS — E785 Hyperlipidemia, unspecified: Secondary | ICD-10-CM | POA: Diagnosis present

## 2022-12-03 DIAGNOSIS — Z79899 Other long term (current) drug therapy: Secondary | ICD-10-CM | POA: Diagnosis not present

## 2022-12-03 DIAGNOSIS — R042 Hemoptysis: Secondary | ICD-10-CM | POA: Diagnosis present

## 2022-12-03 DIAGNOSIS — R911 Solitary pulmonary nodule: Secondary | ICD-10-CM | POA: Diagnosis present

## 2022-12-03 DIAGNOSIS — Z888 Allergy status to other drugs, medicaments and biological substances status: Secondary | ICD-10-CM | POA: Diagnosis not present

## 2022-12-03 DIAGNOSIS — Z8673 Personal history of transient ischemic attack (TIA), and cerebral infarction without residual deficits: Secondary | ICD-10-CM | POA: Diagnosis not present

## 2022-12-03 DIAGNOSIS — Z87891 Personal history of nicotine dependence: Secondary | ICD-10-CM | POA: Diagnosis not present

## 2022-12-03 DIAGNOSIS — R0602 Shortness of breath: Secondary | ICD-10-CM | POA: Diagnosis present

## 2022-12-03 DIAGNOSIS — I129 Hypertensive chronic kidney disease with stage 1 through stage 4 chronic kidney disease, or unspecified chronic kidney disease: Secondary | ICD-10-CM | POA: Diagnosis present

## 2022-12-03 DIAGNOSIS — N1832 Chronic kidney disease, stage 3b: Secondary | ICD-10-CM | POA: Diagnosis present

## 2022-12-03 DIAGNOSIS — Z96651 Presence of right artificial knee joint: Secondary | ICD-10-CM | POA: Diagnosis present

## 2022-12-03 DIAGNOSIS — Z1152 Encounter for screening for COVID-19: Secondary | ICD-10-CM | POA: Diagnosis not present

## 2022-12-03 DIAGNOSIS — J439 Emphysema, unspecified: Secondary | ICD-10-CM | POA: Diagnosis present

## 2022-12-03 DIAGNOSIS — Z902 Acquired absence of lung [part of]: Secondary | ICD-10-CM | POA: Diagnosis not present

## 2022-12-03 LAB — CBC WITH DIFFERENTIAL/PLATELET
Abs Immature Granulocytes: 0.07 10*3/uL (ref 0.00–0.07)
Basophils Absolute: 0 10*3/uL (ref 0.0–0.1)
Basophils Relative: 0 %
Eosinophils Absolute: 0 10*3/uL (ref 0.0–0.5)
Eosinophils Relative: 0 %
HCT: 33.8 % — ABNORMAL LOW (ref 36.0–46.0)
Hemoglobin: 10.8 g/dL — ABNORMAL LOW (ref 12.0–15.0)
Immature Granulocytes: 0 %
Lymphocytes Relative: 11 %
Lymphs Abs: 1.8 10*3/uL (ref 0.7–4.0)
MCH: 31.5 pg (ref 26.0–34.0)
MCHC: 32 g/dL (ref 30.0–36.0)
MCV: 98.5 fL (ref 80.0–100.0)
Monocytes Absolute: 1.8 10*3/uL — ABNORMAL HIGH (ref 0.1–1.0)
Monocytes Relative: 11 %
Neutro Abs: 12.2 10*3/uL — ABNORMAL HIGH (ref 1.7–7.7)
Neutrophils Relative %: 78 %
Platelets: 224 10*3/uL (ref 150–400)
RBC: 3.43 MIL/uL — ABNORMAL LOW (ref 3.87–5.11)
RDW: 13.6 % (ref 11.5–15.5)
WBC: 15.8 10*3/uL — ABNORMAL HIGH (ref 4.0–10.5)
nRBC: 0 % (ref 0.0–0.2)

## 2022-12-03 LAB — COMPREHENSIVE METABOLIC PANEL
ALT: 11 U/L (ref 0–44)
AST: 23 U/L (ref 15–41)
Albumin: 2.9 g/dL — ABNORMAL LOW (ref 3.5–5.0)
Alkaline Phosphatase: 70 U/L (ref 38–126)
Anion gap: 8 (ref 5–15)
BUN: 21 mg/dL (ref 8–23)
CO2: 25 mmol/L (ref 22–32)
Calcium: 8.8 mg/dL — ABNORMAL LOW (ref 8.9–10.3)
Chloride: 104 mmol/L (ref 98–111)
Creatinine, Ser: 1.43 mg/dL — ABNORMAL HIGH (ref 0.44–1.00)
GFR, Estimated: 35 mL/min — ABNORMAL LOW (ref >60)
Glucose, Bld: 121 mg/dL — ABNORMAL HIGH (ref 70–99)
Potassium: 4.4 mmol/L (ref 3.5–5.1)
Sodium: 137 mmol/L (ref 135–145)
Total Bilirubin: 0.4 mg/dL (ref 0.3–1.2)
Total Protein: 6.1 g/dL — ABNORMAL LOW (ref 6.5–8.1)

## 2022-12-03 LAB — RESP PANEL BY RT-PCR (RSV, FLU A&B, COVID)  RVPGX2
Influenza A by PCR: NEGATIVE
Influenza B by PCR: NEGATIVE
Resp Syncytial Virus by PCR: NEGATIVE
SARS Coronavirus 2 by RT PCR: NEGATIVE

## 2022-12-03 LAB — PROTIME-INR
INR: 1.1 (ref 0.8–1.2)
Prothrombin Time: 14.5 seconds (ref 11.4–15.2)

## 2022-12-03 LAB — RESPIRATORY PANEL BY PCR
Adenovirus: NOT DETECTED
Adenovirus: NOT DETECTED
Bordetella Parapertussis: NOT DETECTED
Bordetella Parapertussis: NOT DETECTED
Bordetella pertussis: NOT DETECTED
Bordetella pertussis: NOT DETECTED
Chlamydophila pneumoniae: NOT DETECTED
Chlamydophila pneumoniae: NOT DETECTED
Coronavirus 229E: NOT DETECTED
Coronavirus 229E: NOT DETECTED
Coronavirus HKU1: NOT DETECTED
Coronavirus HKU1: NOT DETECTED
Coronavirus NL63: NOT DETECTED
Coronavirus NL63: NOT DETECTED
Coronavirus OC43: NOT DETECTED
Coronavirus OC43: NOT DETECTED
Influenza A: NOT DETECTED
Influenza A: NOT DETECTED
Influenza B: NOT DETECTED
Influenza B: NOT DETECTED
Metapneumovirus: NOT DETECTED
Metapneumovirus: NOT DETECTED
Mycoplasma pneumoniae: NOT DETECTED
Mycoplasma pneumoniae: NOT DETECTED
Parainfluenza Virus 1: NOT DETECTED
Parainfluenza Virus 1: NOT DETECTED
Parainfluenza Virus 2: NOT DETECTED
Parainfluenza Virus 2: NOT DETECTED
Parainfluenza Virus 3: NOT DETECTED
Parainfluenza Virus 3: NOT DETECTED
Parainfluenza Virus 4: DETECTED — AB
Parainfluenza Virus 4: DETECTED — AB
Respiratory Syncytial Virus: NOT DETECTED
Respiratory Syncytial Virus: NOT DETECTED
Rhinovirus / Enterovirus: NOT DETECTED
Rhinovirus / Enterovirus: NOT DETECTED

## 2022-12-03 LAB — HIV ANTIBODY (ROUTINE TESTING W REFLEX): HIV Screen 4th Generation wRfx: NONREACTIVE

## 2022-12-03 LAB — MAGNESIUM: Magnesium: 2 mg/dL (ref 1.7–2.4)

## 2022-12-03 LAB — LACTIC ACID, PLASMA: Lactic Acid, Venous: 1.7 mmol/L (ref 0.5–1.9)

## 2022-12-03 LAB — PROCALCITONIN: Procalcitonin: 0.11 ng/mL

## 2022-12-03 LAB — PHOSPHORUS: Phosphorus: 4 mg/dL (ref 2.5–4.6)

## 2022-12-03 MED ORDER — ATORVASTATIN CALCIUM 10 MG PO TABS
10.0000 mg | ORAL_TABLET | Freq: Every day | ORAL | Status: DC
  Administered 2022-12-04 – 2022-12-06 (×3): 10 mg via ORAL
  Filled 2022-12-03 (×4): qty 1

## 2022-12-03 MED ORDER — LEVOTHYROXINE SODIUM 50 MCG PO TABS
50.0000 ug | ORAL_TABLET | Freq: Every day | ORAL | Status: DC
  Administered 2022-12-05 – 2022-12-06 (×2): 50 ug via ORAL
  Filled 2022-12-03 (×2): qty 1
  Filled 2022-12-03: qty 2
  Filled 2022-12-03: qty 1

## 2022-12-03 MED ORDER — ACETAMINOPHEN 500 MG PO TABS
1000.0000 mg | ORAL_TABLET | ORAL | Status: AC
  Administered 2022-12-03: 1000 mg via ORAL
  Filled 2022-12-03: qty 2

## 2022-12-03 MED ORDER — SODIUM CHLORIDE 0.9 % IV SOLN
500.0000 mg | INTRAVENOUS | Status: DC
  Administered 2022-12-03: 500 mg via INTRAVENOUS
  Filled 2022-12-03 (×2): qty 5

## 2022-12-03 MED ORDER — LACTATED RINGERS IV BOLUS
1000.0000 mL | Freq: Once | INTRAVENOUS | Status: AC
Start: 2022-12-03 — End: 2022-12-03
  Administered 2022-12-03: 1000 mL via INTRAVENOUS

## 2022-12-03 MED ORDER — HEPARIN SODIUM (PORCINE) 5000 UNIT/ML IJ SOLN
5000.0000 [IU] | Freq: Three times a day (TID) | INTRAMUSCULAR | Status: DC
  Administered 2022-12-03 – 2022-12-06 (×8): 5000 [IU] via SUBCUTANEOUS
  Filled 2022-12-03 (×10): qty 1

## 2022-12-03 MED ORDER — SODIUM CHLORIDE 0.9 % IV SOLN
2.0000 g | INTRAVENOUS | Status: DC
  Administered 2022-12-03 – 2022-12-04 (×2): 2 g via INTRAVENOUS
  Filled 2022-12-03 (×2): qty 20

## 2022-12-03 MED ORDER — SODIUM CHLORIDE 0.9 % IV SOLN: INTRAVENOUS | Status: DC

## 2022-12-03 MED ORDER — IOHEXOL 350 MG/ML SOLN
60.0000 mL | Freq: Once | INTRAVENOUS | Status: AC | PRN
  Administered 2022-12-03: 60 mL via INTRAVENOUS

## 2022-12-03 NOTE — ED Notes (Addendum)
Pt transported to [facility]

## 2022-12-03 NOTE — ED Notes (Signed)
Called x 2 to have procal added to previous sample. Will reprint order rec for resp panel and send to lab again

## 2022-12-03 NOTE — ED Notes (Signed)
ED TO INPATIENT HANDOFF REPORT  ED Nurse Name and Phone #: Jory Sims RN [phone]  S Name/Age/Gender Valerie Young 89 y.o. female Room/Bed: 002C/002C  Code Status   Code Status: Full Code  Home/SNF/Other Home Patient oriented to: self Is this baseline?  unknown     Chief Complaint CAP (community acquired pneumonia) [J18.9]  Triage Note Pt sent by PCP to the ED for pneumonia. Pt states she has had cough with blood tinged sputum and ShOB for a "couple of days."    Allergies Allergies  Allergen Reactions   Sulfamethoxazole-Trimethoprim Rash   Tramadol Hcl     Other reaction(s): mental status changes    Level of Care/Admitting Diagnosis ED Disposition     ED Disposition  Admit   Condition  --   [place]: [place] [[id number]]  Level of Care: Telemetry Medical [104]  May admit patient to [facility] or [facility] if equivalent level of care is available:: Yes  Covid Evaluation: Confirmed COVID Negative  Diagnosis: CAP (community acquired pneumonia) [397673]  Admitting Physician: Clance Boll [[id number]]  Attending Physician: Clance Boll [[id number]]  Certification:: I certify this patient will need inpatient services for at least 2 midnights  Estimated Length of Stay: 3          B Medical/Surgery History Past Medical History:  Diagnosis Date   Blood clot associated with vein wall inflammation    Cancer ([place])    right lung ca - 30 years ago / left lung - 11/2012   Cancer ([place])    Left lung   CKD (chronic kidney disease)    COPD (chronic obstructive pulmonary disease) ([place])    DJD (degenerative joint disease)    knees   H/O: lung cancer 1982   Right; treated with resection   Hypertension    Hypothyroidism    PND (post-nasal drip)    Chronic   Past Surgical History:  Procedure Laterality Date   ABDOMINAL HYSTERECTOMY     PARTIAL   CATARACT EXTRACTION, BILATERAL     JOINT REPLACEMENT     RIGHT TOTAL KNEE    LOOP RECORDER INSERTION N/A 06/12/2020   Procedure: LOOP RECORDER INSERTION;  Surgeon: Deboraha Sprang, MD;  Location: [facility];  Service: Cardiovascular;  Laterality: N/A;   RADIATION OF LEFT LUNG     right lung cancer treated with resection  1982   right thigh postoperative hematoma  02-2008   RIGHT UPPER LOBE REMOVED     THYROID SURGERY     THYROIDECTOMY     TOTAL KNEE ARTHROPLASTY     Right   VESICOVAGINAL FISTULA CLOSURE W/ TAH     VIDEO BRONCHOSCOPY  12/08/2012   Procedure: VIDEO BRONCHOSCOPY WITH FLUORO;  Surgeon: Kathee Delton, MD;  Location: [facility];  Service: Cardiopulmonary;  Laterality: Bilateral;     A IV Location/Drains/Wounds Patient Lines/Drains/Airways Status     Active Line/Drains/Airways     Name Placement date Placement time Site Days   Peripheral IV 12/02/22 20 G 1" Anterior;Right Forearm 12/02/22  2305  Forearm  1            Intake/Output Last 24 hours  Intake/Output Summary (Last 24 hours) at 12/03/2022 0941 Last data filed at 12/03/2022 0151 Gross per 24 hour  Intake 100 ml  Output --  Net 100 ml    Labs/Imaging Results for orders placed or performed during the hospital encounter of 12/02/22 (from the past 48  hour(s))  Resp panel by RT-PCR (RSV, Flu A&B, Covid) Anterior Nasal Swab     Status: None   Collection Time: 12/02/22 11:06 PM   Specimen: Anterior Nasal Swab  Result Value Ref Range   SARS Coronavirus 2 by RT PCR NEGATIVE NEGATIVE    Comment: (NOTE) SARS-CoV-2 target nucleic acids are NOT DETECTED.  The SARS-CoV-2 RNA is generally detectable in upper respiratory specimens during the acute phase of infection. The lowest concentration of SARS-CoV-2 viral copies this assay can detect is 138 copies/mL. A negative result does not preclude SARS-Cov-2 infection and should not be used as the sole basis for treatment or other patient management decisions. A negative result may occur with  improper specimen collection/handling,  submission of specimen other than nasopharyngeal swab, presence of viral mutation(s) within the areas targeted by this assay, and inadequate number of viral copies(<138 copies/mL). A negative result must be combined with clinical observations, patient history, and epidemiological information. The expected result is Negative.  Fact Sheet for Patients:  EntrepreneurPulse.com.au  Fact Sheet for Healthcare Providers:  IncredibleEmployment.be  This test is no t yet approved or cleared by the [place] FDA and  has been authorized for detection and/or diagnosis of SARS-CoV-2 by FDA under an Emergency Use Authorization (EUA). This EUA will remain  in effect (meaning this test can be used) for the duration of the COVID-19 declaration under Section 564(b)(1) of the Act, 21 U.S.C.section 360bbb-3(b)(1), unless the authorization is terminated  or revoked sooner.       Influenza A by PCR NEGATIVE NEGATIVE   Influenza B by PCR NEGATIVE NEGATIVE    Comment: (NOTE) The Xpert Xpress SARS-CoV-2/FLU/RSV plus assay is intended as an aid in the diagnosis of influenza from Nasopharyngeal swab specimens and should not be used as a sole basis for treatment. Nasal washings and aspirates are unacceptable for Xpert Xpress SARS-CoV-2/FLU/RSV testing.  Fact Sheet for Patients: EntrepreneurPulse.com.au  Fact Sheet for Healthcare Providers: IncredibleEmployment.be  This test is not yet approved or cleared by the [place] FDA and has been authorized for detection and/or diagnosis of SARS-CoV-2 by FDA under an Emergency Use Authorization (EUA). This EUA will remain in effect (meaning this test can be used) for the duration of the COVID-19 declaration under Section 564(b)(1) of the Act, 21 U.S.C. section 360bbb-3(b)(1), unless the authorization is terminated or revoked.     Resp Syncytial Virus by PCR NEGATIVE NEGATIVE     Comment: (NOTE) Fact Sheet for Patients: EntrepreneurPulse.com.au  Fact Sheet for Healthcare Providers: IncredibleEmployment.be  This test is not yet approved or cleared by the [place] FDA and has been authorized for detection and/or diagnosis of SARS-CoV-2 by FDA under an Emergency Use Authorization (EUA). This EUA will remain in effect (meaning this test can be used) for the duration of the COVID-19 declaration under Section 564(b)(1) of the Act, 21 U.S.C. section 360bbb-3(b)(1), unless the authorization is terminated or revoked.  Performed at [facility], [place] [address] [place], [place] [address]   Comprehensive metabolic panel     Status: Abnormal   Collection Time: 12/02/22 11:06 PM  Result Value Ref Range   Sodium 137 135 - 145 mmol/L   Potassium 4.4 3.5 - 5.1 mmol/L   Chloride 102 98 - 111 mmol/L   CO2 27 22 - 32 mmol/L   Glucose, Bld 133 (H) 70 - 99 mg/dL    Comment: Glucose reference range applies only to samples taken after fasting for at least 8 hours.  BUN 24 (H) 8 - 23 mg/dL   Creatinine, Ser 1.46 (H) 0.44 - 1.00 mg/dL   Calcium 9.1 8.9 - 10.3 mg/dL   Total Protein 7.3 6.5 - 8.1 g/dL   Albumin 3.5 3.5 - 5.0 g/dL   AST 22 15 - 41 U/L   ALT 13 0 - 44 U/L   Alkaline Phosphatase 87 38 - 126 U/L   Total Bilirubin 0.6 0.3 - 1.2 mg/dL   GFR, Estimated 34 (L) >60 mL/min    Comment: (NOTE) Calculated using the CKD-EPI Creatinine Equation (2021)    Anion gap 8 5 - 15    Comment: Performed at [facility] [address], [place], [place] [address]  CBC     Status: Abnormal   Collection Time: 12/02/22 11:06 PM  Result Value Ref Range   WBC 15.8 (H) 4.0 - 10.5 K/uL   RBC 3.90 3.87 - 5.11 MIL/uL   Hemoglobin 12.2 12.0 - 15.0 g/dL   HCT 38.5 36.0 - 46.0 %   MCV 98.7 80.0 - 100.0 fL   MCH 31.3 26.0 - 34.0 pg   MCHC 31.7 30.0 - 36.0 g/dL   RDW 13.6 11.5 - 15.5 %   Platelets 272 150 - 400 K/uL   nRBC 0.0 0.0 -  0.2 %    Comment: Performed at [facility], [place] [address], [place], [place] [address]  Lactic acid, plasma     Status: None   Collection Time: 12/02/22 11:06 PM  Result Value Ref Range   Lactic Acid, Venous 1.7 0.5 - 1.9 mmol/L    Comment: Performed at [facility] [address], [place], [place] [address]  I-stat chem 8, ED (not at [facility], [facility] or [facility])     Status: Abnormal   Collection Time: 12/02/22 11:23 PM  Result Value Ref Range   Sodium 140 135 - 145 mmol/L   Potassium 5.1 3.5 - 5.1 mmol/L   Chloride 103 98 - 111 mmol/L   BUN 27 (H) 8 - 23 mg/dL   Creatinine, Ser 1.50 (H) 0.44 - 1.00 mg/dL   Glucose, Bld 133 (H) 70 - 99 mg/dL    Comment: Glucose reference range applies only to samples taken after fasting for at least 8 hours.   Calcium, Ion 1.23 1.15 - 1.40 mmol/L   TCO2 26 22 - 32 mmol/L   Hemoglobin 13.6 12.0 - 15.0 g/dL   HCT 40.0 36.0 - 46.0 %  I-Stat venous blood gas, ([facility] ED, [facility], [facility])     Status: Abnormal   Collection Time: 12/02/22 11:24 PM  Result Value Ref Range   pH, Ven 7.384 7.25 - 7.43   pCO2, Ven 49.5 44 - 60 mmHg   pO2, Ven 29 (LL) 32 - 45 mmHg   Bicarbonate 29.5 (H) 20.0 - 28.0 mmol/L   TCO2 31 22 - 32 mmol/L   O2 Saturation 54 %   Acid-Base Excess 4.0 (H) 0.0 - 2.0 mmol/L   Sodium 138 135 - 145 mmol/L   Potassium 5.7 (H) 3.5 - 5.1 mmol/L   Calcium, Ion 1.19 1.15 - 1.40 mmol/L   HCT 38.0 36.0 - 46.0 %   Hemoglobin 12.9 12.0 - 15.0 g/dL   Sample type VENOUS    Comment NOTIFIED PHYSICIAN   Lactic acid, plasma     Status: None   Collection Time: 12/03/22  7:04 AM  Result Value Ref Range   Lactic Acid, Venous 1.7 0.5 - 1.9 mmol/L  Comment: Performed at [facility], [place] [address], [place], [place] [address]  Magnesium     Status: None   Collection Time: 12/03/22  7:04 AM  Result Value Ref Range   Magnesium 2.0 1.7 - 2.4 mg/dL    Comment: Performed at [facility] [address] [place], [place] [address]  Comprehensive  metabolic panel     Status: Abnormal   Collection Time: 12/03/22  7:04 AM  Result Value Ref Range   Sodium 137 135 - 145 mmol/L   Potassium 4.4 3.5 - 5.1 mmol/L   Chloride 104 98 - 111 mmol/L   CO2 25 22 - 32 mmol/L   Glucose, Bld 121 (H) 70 - 99 mg/dL    Comment: Glucose reference range applies only to samples taken after fasting for at least 8 hours.   BUN 21 8 - 23 mg/dL   Creatinine, Ser 1.43 (H) 0.44 - 1.00 mg/dL   Calcium 8.8 (L) 8.9 - 10.3 mg/dL   Total Protein 6.1 (L) 6.5 - 8.1 g/dL   Albumin 2.9 (L) 3.5 - 5.0 g/dL   AST 23 15 - 41 U/L   ALT 11 0 - 44 U/L   Alkaline Phosphatase 70 38 - 126 U/L   Total Bilirubin 0.4 0.3 - 1.2 mg/dL   GFR, Estimated 35 (L) >60 mL/min    Comment: (NOTE) Calculated using the CKD-EPI Creatinine Equation (2021)    Anion gap 8 5 - 15    Comment: Performed at [facility], [place] [address] [place], [place] [address]  Phosphorus     Status: None   Collection Time: 12/03/22  7:04 AM  Result Value Ref Range   Phosphorus 4.0 2.5 - 4.6 mg/dL    Comment: Performed at [facility] [address] [place], [place] [address]  CBC with Differential/Platelet     Status: Abnormal   Collection Time: 12/03/22  7:04 AM  Result Value Ref Range   WBC 15.8 (H) 4.0 - 10.5 K/uL   RBC 3.43 (L) 3.87 - 5.11 MIL/uL   Hemoglobin 10.8 (L) 12.0 - 15.0 g/dL   HCT 33.8 (L) 36.0 - 46.0 %   MCV 98.5 80.0 - 100.0 fL   MCH 31.5 26.0 - 34.0 pg   MCHC 32.0 30.0 - 36.0 g/dL   RDW 13.6 11.5 - 15.5 %   Platelets 224 150 - 400 K/uL   nRBC 0.0 0.0 - 0.2 %   Neutrophils Relative % 78 %   Neutro Abs 12.2 (H) 1.7 - 7.7 K/uL   Lymphocytes Relative 11 %   Lymphs Abs 1.8 0.7 - 4.0 K/uL   Monocytes Relative 11 %   Monocytes Absolute 1.8 (H) 0.1 - 1.0 K/uL   Eosinophils Relative 0 %   Eosinophils Absolute 0.0 0.0 - 0.5 K/uL   Basophils Relative 0 %   Basophils Absolute 0.0 0.0 - 0.1 K/uL   Immature Granulocytes 0 %   Abs Immature Granulocytes 0.07 0.00 - 0.07 K/uL     Comment: Performed at [facility], [place] [address] [place], [place] [address]  Protime-INR     Status: None   Collection Time: 12/03/22  7:04 AM  Result Value Ref Range   Prothrombin Time 14.5 11.4 - 15.2 seconds   INR 1.1 0.8 - 1.2    Comment: (NOTE) INR goal varies based on device and disease states. Performed at [facility], [place] [address], [place],  [address]  CT Angio Chest PE W/Cm &/Or Wo Cm  Result Date: 12/03/2022 CLINICAL DATA:  89 year old with history of COPD and lung cancer, presents with shortness of breath and cough with hemoptysis. Pulmonary embolism suspected. EXAM: CT ANGIOGRAPHY CHEST WITH CONTRAST TECHNIQUE: Multidetector CT imaging of the chest was performed using the standard protocol during bolus administration of intravenous contrast. Multiplanar CT image reconstructions and MIPs were obtained to evaluate the vascular anatomy. RADIATION DOSE REDUCTION: This exam was performed according to the departmental dose-optimization program which includes automated exposure control, adjustment of the mA and/or kV according to patient size and/or use of iterative reconstruction technique. CONTRAST:  39mL OMNIPAQUE IOHEXOL 350 MG/ML SOLN COMPARISON:  PA and lateral 12/02/2022, chest radiograph 06/12/2020, CTA chest 06/09/2020 FINDINGS: Cardiovascular: There is no appreciable arterial embolus or dilatation. Left upper lobe arteries are poorly opacified as before, with chronic left upper lobe collapse again suspected post treatment related and with a small chronic loculated left apical pleural effusion. There is mild cardiomegaly. There is patchy calcification in the circumflex and LAD coronary arteries and no pericardial effusion. There is moderate mixed plaque in the aorta, proximal great vessels. The pulmonary veins are decompressed. No aortic aneurysm or dissection is seen and no significant aortic or great vessel stenosis. Mediastinum/Nodes: No intrathoracic or  axillary adenopathy is seen. No filling defect in the trachea and main bronchi. There is no esophageal thickening. Thyroid gland is atrophic without mass. Lungs/Pleura: Remote right upper lobe hypertrophy. Chronic collapse again noted in the left upper lobe with chronic loculated left apical pleural fluid and with left-sided chronic volume loss, again suspected treatment related. There are emphysematous and scarring changes of the lungs. No new pleural effusion is seen. No pneumothorax. There are increased patchy alveolar infiltrates in the left mid to lower lung field most likely due to interval new pneumonia. Some of the consolidation demonstrates increased cystic changes in the parenchyma which could indicate a necrotizing pneumonic component. On the right, there is increased patchy ground-glass opacity in the basal segments of the lower lobe. This likely indicates additional areas of pneumonitis. Posteromedially in the right upper lobe, there is a paraspinal pleural-based 2.1 x 1.5 cm nodule which previously had a more ground-glass character and is concerning for neoplasm, although it is not significantly changed in overall size. PET-CT evaluation is recommended. There are coarse scarring changes in the right apex. No other significant or new pulmonary opacities are seen. Upper Abdomen: Not well seen due to breathing motion and the patient's overlying left arm creating streak artifacts. No obvious acute changes. Musculoskeletal: There is osteopenia with mild degenerative change in the thoracic spine. No destructive bone lesion is seen. No chest wall mass. Review of the MIP images confirms the above findings. IMPRESSION: 1. No evidence of arterial embolus, with suboptimal depiction of left upper lobe arteries due to chronic left upper lobe collapse. 2. Chronic left upper lobe collapse with chronic loculated left apical pleural effusion. Old right upper lobectomy. 3. Interval development of patchy alveolar  infiltrates in the left mid to lower lung field, with increased cystic changes in the parenchyma which could indicate a necrotizing pneumonic component. 4. Increased patchy ground-glass opacity in the basal segments of the right lower lobe, likely additional areas of pneumonitis. 5. 2.1 x 1.5 cm pleural-based nodule in the posteromedial right upper lobe, previously ground-glass in character and concerning for neoplasm. PET-CT recommended. 6. Aortic and coronary artery atherosclerosis.  Mild cardiomegaly. Electronically Signed   By: Ninfa Linden.D.  On: 12/03/2022 00:59    Pending Labs Unresulted Labs (From admission, onward)     Start     Ordered   12/03/22 0500  CBC with Differential/Platelet  Tomorrow morning,   R        12/03/22 0414   12/03/22 0413  Legionella Pneumophila Serogp 1 Ur Ag  Once,   R        12/03/22 0414   12/03/22 0412  Respiratory (~20 pathogens) panel by PCR  (Respiratory panel by PCR (~20 pathogens, ~24 hr TAT)  w precautions)  Once,   R        12/03/22 0414   12/03/22 0411  HIV Antibody (routine testing w rflx)  (HIV Antibody (Routine testing w reflex) panel)  Once,   R        12/03/22 0414   12/02/22 2317  Blood culture (routine x 2)  BLOOD CULTURE X 2,   R (with STAT occurrences)      12/02/22 2317            Vitals/Pain Today's Vitals   12/03/22 0830 12/03/22 0845 12/03/22 0913 12/03/22 0915  BP: 121/66 (!) 131/55  (!) 157/75  Pulse: 67 66  78  Resp: 20 19  (!) 22  Temp:      TempSrc:      SpO2: 98% 97% 97% 97%  Weight:      Height:      PainSc:        Isolation Precautions Droplet precaution  Medications Medications  heparin injection 5,000 Units (5,000 Units Subcutaneous Patient Refused/Not Given 12/03/22 0858)  cefTRIAXone (ROCEPHIN) 2 g in sodium chloride 0.9 % 100 mL IVPB (2 g Intravenous Patient Refused/Not Given 12/03/22 0915)  azithromycin (ZITHROMAX) 500 mg in sodium chloride 0.9 % 250 mL IVPB (has no administration in time range)  0.9  %  sodium chloride infusion ( Intravenous Patient Refused/Not Given 12/03/22 0916)  atorvastatin (LIPITOR) tablet 10 mg (10 mg Oral Patient Refused/Not Given 12/03/22 0858)  levothyroxine (SYNTHROID) tablet 50 mcg (50 mcg Oral Patient Refused/Not Given 12/03/22 0858)  ipratropium-albuterol (DUONEB) 0.5-2.5 (3) MG/3ML nebulizer solution 3 mL (3 mLs Nebulization Given 12/03/22 0033)  cefTRIAXone (ROCEPHIN) 1 g in sodium chloride 0.9 % 100 mL IVPB (0 g Intravenous Stopped 12/03/22 0151)  azithromycin (ZITHROMAX) tablet 500 mg (500 mg Oral Given 12/03/22 0114)  iohexol (OMNIPAQUE) 350 MG/ML injection 60 mL (60 mLs Intravenous Contrast Given 12/03/22 0005)  acetaminophen (TYLENOL) tablet 1,000 mg (1,000 mg Oral Given 12/03/22 0152)  lactated ringers bolus 1,000 mL (0 mLs Intravenous Stopped 12/03/22 0440)    Mobility Unknown how pt ambulates High fall risk (RN judgement)   Focused Assessments Pulmonary Assessment Handoff:  Lung sounds: Bilateral Breath Sounds: Rhonchi, Diminished, Clear (rhonchi anterior upper lobes, diminished and clear throughout the rest) L Breath Sounds: Expiratory wheezes R Breath Sounds: Expiratory wheezes O2 Device: Nasal Cannula O2 Flow Rate (L/min): 2 L/min    R Recommendations: See Admitting Provider Note  Report given to:   Additional Notes: Please see note at 0905 regarding pt behaviors and refusal of medications.

## 2022-12-03 NOTE — H&P (Addendum)
History and Physical    Valerie Young LDJ:[id number] DOB: 10-29-1929 DOA: 12/02/2022  PCP: Lois Huxley, PA  Patient coming from: home  I have personally briefly reviewed patient's old medical records in [place]  Chief Complaint: sob cough x 1 mo  , worse over last few days   HPI: Valerie Young is a 89 y.o. female with medical history significant of  Hypertension, CKDIII,hypothyroidism, COPD, Hyperlipidemia, CVA, lung ca s/p right upper lobectomy,who presents to ED with sob cough x 1 mo with acute progression over the last few days. Patient currently denies any pain/ and notes she feels improved , no n/v/d. Noted fever at home.  ED Course:  Afeb, BP 159/71, rr 16, sat 95%  EKG: sinus rhythm  RAD Wbc : 15.8, hgb 12.2, plt 272  La citc 1.7  Na 137, K 4.4 , CL 102, gly 133, cr 1.46 ( 1.1-128) Respiratory panel neg  Vbg:7.38/49.5 CTPA: 1. No evidence of arterial embolus, with suboptimal depiction of left upper lobe arteries due to chronic left upper lobe collapse. 2. Chronic left upper lobe collapse with chronic loculated left apical pleural effusion. Old right upper lobectomy. 3. Interval development of patchy alveolar infiltrates in the left mid to lower lung field, with increased cystic changes in the parenchyma which could indicate a necrotizing pneumonic component. 4. Increased patchy ground-glass opacity in the basal segments of the right lower lobe, likely additional areas of pneumonitis. 5. 2.1 x 1.5 cm pleural-based nodule in the posteromedial right upper lobe, previously ground-glass in character and concerning for neoplasm. PET-CT recommended. 6. Aortic and coronary artery atherosclerosis.  Mild cardiomegaly.   Tx ctx/ douneb,tylenol, LR1L Review of Systems: As per HPI otherwise 10 point review of systems negative.   Past Medical History:  Diagnosis Date   Blood clot associated with vein wall inflammation    Cancer (HCC)    right lung ca - 30 years ago /  left lung - 11/2012   Cancer ([place])    Left lung   CKD (chronic kidney disease)    COPD (chronic obstructive pulmonary disease) (HCC)    DJD (degenerative joint disease)    knees   H/O: lung cancer 1982   Right; treated with resection   Hypertension    Hypothyroidism    PND (post-nasal drip)    Chronic    Past Surgical History:  Procedure Laterality Date   ABDOMINAL HYSTERECTOMY     PARTIAL   CATARACT EXTRACTION, BILATERAL     JOINT REPLACEMENT     RIGHT TOTAL KNEE   LOOP RECORDER INSERTION N/A 06/12/2020   Procedure: LOOP RECORDER INSERTION;  Surgeon: Deboraha Sprang, MD;  Location: [facility];  Service: Cardiovascular;  Laterality: N/A;   RADIATION OF LEFT LUNG     right lung cancer treated with resection  1982   right thigh postoperative hematoma  02-2008   RIGHT UPPER LOBE REMOVED     THYROID SURGERY     THYROIDECTOMY     TOTAL KNEE ARTHROPLASTY     Right   VESICOVAGINAL FISTULA CLOSURE W/ TAH     VIDEO BRONCHOSCOPY  12/08/2012   Procedure: VIDEO BRONCHOSCOPY WITH FLUORO;  Surgeon: Kathee Delton, MD;  Location: [facility];  Service: Cardiopulmonary;  Laterality: Bilateral;     reports that she quit smoking about 42 years ago. Her smoking use included cigarettes. She has a 35.00 pack-year smoking history. She has never used smokeless tobacco. She reports that she does  not drink alcohol and does not use drugs.  Allergies  Allergen Reactions   Sulfamethoxazole-Trimethoprim Rash   Tramadol Hcl     Other reaction(s): mental status changes    Family History  Problem Relation Age of Onset   CVA Mother 67       hemorrhagic     Prior to Admission medications   Medication Sig Start Date End Date Taking? Authorizing Provider  acetaminophen (TYLENOL) 325 MG tablet Take 650 mg by mouth 3 (three) times daily. TWO TO THREE TIMES A DAY    [provider]  albuterol (PROVENTIL HFA;VENTOLIN HFA) 108 (90 Base) MCG/ACT inhaler Inhale 2 puffs into the lungs every  6 (six) hours as needed for wheezing or shortness of breath. 10/15/18   Collene Gobble, MD  amLODipine (NORVASC) 5 MG tablet TAKE 1 TABLET (5 MG TOTAL) BY MOUTH AT BEDTIME. Patient taking differently: Take 5 mg by mouth daily.     Tresa Garter, MD  atenolol (TENORMIN) 50 MG tablet Take 1 tablet (50 mg total) by mouth daily. 01/06/14   Tresa Garter, MD  B Complex Vitamins (B COMPLEX 100 PO) Take 1 tablet by mouth daily.    [provider]  cetirizine (ZYRTEC) 10 MG tablet TAKE 1 TABLET (10 MG TOTAL) BY MOUTH AT BEDTIME. Patient taking differently: Take 10 mg by mouth daily.  11/06/15   Collene Gobble, MD  Cholecalciferol (VITAMIN D-3) 1000 units CAPS Take 3,000 Units by mouth daily.    [provider]  guaiFENesin (MUCINEX) 600 MG 12 hr tablet Take 2 tablets (1,200 mg total) by mouth 2 (two) times daily. 10/06/15   Hosie Poisson, MD  levothyroxine (SYNTHROID, LEVOTHROID) 50 MCG tablet Take 50 mcg by mouth daily before breakfast.  09/12/15   [provider]  polyethylene glycol (MIRALAX / GLYCOLAX) 17 g packet Take 17 g by mouth daily as needed for mild constipation. 06/12/20   Raiford Noble Latif, DO  polyvinyl alcohol (LIQUIFILM TEARS) 1.4 % ophthalmic solution Place 1 drop into both eyes as needed for dry eyes.    [provider]  pravastatin (PRAVACHOL) 40 MG tablet Take 40 mg by mouth daily. 07/16/15   [provider]    Physical Exam: Vitals:   12/03/22 0130 12/03/22 0151 12/03/22 0241 12/03/22 0300  BP: (!) 159/63  (!) 126/98 102/88  Pulse: (!) 101  99 96  Resp: 18  19 20   Temp:  98.9 F (37.2 C)    TempSrc:  Oral    SpO2: 98%  93% 97%  Weight:      Height:        Constitutional: NAD, calm, comfortable Vitals:   12/03/22 0130 12/03/22 0151 12/03/22 0241 12/03/22 0300  BP: (!) 159/63  (!) 126/98 102/88  Pulse: (!) 101  99 96  Resp: 18  19 20   Temp:  98.9 F (37.2 C)    TempSrc:  Oral    SpO2: 98%  93% 97%  Weight:       Height:       Eyes: deferred ENMT: deferred Neck: normal, supple, no masses, no thyromegaly Respiratory: ant clear , no wheezing, no crackles. Normal respiratory effort. No accessory muscle use. (Not willing to cooperate with full exam ) Cardiovascular: Regular rate and rhythm, no murmurs / rubs / gallops. No extremity edema. 2+ pedal pulses.  Abdomen: no tenderness, no masses palpated. No hepatosplenomegaly. Bowel sounds positive.  Musculoskeletal: no clubbing / cyanosis. No joint deformity upper and  lower extremities. Good ROM, no contractures. Normal muscle tone.  Skin: no rashes, lesions, ulcers. No induration Neurologic: CN 2-12 grossly intact. MAE x 4 Psychiatric: unable to assess Labs on Admission: I have personally reviewed following labs and imaging studies  CBC: Recent Labs  Lab 12/02/22 2306 12/02/22 2323 12/02/22 2324  WBC 15.8*  --   --   HGB 12.2 13.6 12.9  HCT 38.5 40.0 38.0  MCV 98.7  --   --   PLT 272  --   --    Basic Metabolic Panel: Recent Labs  Lab 12/02/22 2306 12/02/22 2323 12/02/22 2324  NA 137 140 138  K 4.4 5.1 5.7*  CL 102 103  --   CO2 27  --   --   GLUCOSE 133* 133*  --   BUN 24* 27*  --   CREATININE 1.46* 1.50*  --   CALCIUM 9.1  --   --    GFR: Estimated Creatinine Clearance: 17.5 mL/min (A) (by C-G formula based on SCr of 1.5 mg/dL (H)). Liver Function Tests: Recent Labs  Lab 12/02/22 2306  AST 22  ALT 13  ALKPHOS 87  BILITOT 0.6  PROT 7.3  ALBUMIN 3.5   No results for input(s): "LIPASE", "AMYLASE" in the last 168 hours. No results for input(s): "AMMONIA" in the last 168 hours. Coagulation Profile: No results for input(s): "INR", "PROTIME" in the last 168 hours. Cardiac Enzymes: No results for input(s): "CKTOTAL", "CKMB", "CKMBINDEX", "TROPONINI" in the last 168 hours. BNP (last 3 results) No results for input(s): "PROBNP" in the last 8760 hours. HbA1C: No results for input(s): "HGBA1C" in the last 72 hours. CBG: No  results for input(s): "GLUCAP" in the last 168 hours. Lipid Profile: No results for input(s): "CHOL", "HDL", "LDLCALC", "TRIG", "CHOLHDL", "LDLDIRECT" in the last 72 hours. Thyroid Function Tests: No results for input(s): "TSH", "T4TOTAL", "FREET4", "T3FREE", "THYROIDAB" in the last 72 hours. Anemia Panel: No results for input(s): "VITAMINB12", "FOLATE", "FERRITIN", "TIBC", "IRON", "RETICCTPCT" in the last 72 hours. Urine analysis:    Component Value Date/Time   COLORURINE YELLOW 12/12/2021 [place] 12/12/2021 1854   LABSPEC 1.014 12/12/2021 1854   PHURINE 6.0 12/12/2021 1854   GLUCOSEU NEGATIVE 12/12/2021 1854   HGBUR SMALL (A) 12/12/2021 1854   BILIRUBINUR NEGATIVE 12/12/2021 [place] 12/12/2021 1854   PROTEINUR 30 (A) 12/12/2021 1854   UROBILINOGEN 0.2 04/07/2015 0339   NITRITE NEGATIVE 12/12/2021 1854   LEUKOCYTESUR NEGATIVE 12/12/2021 1854    Radiological Exams on Admission: CT Angio Chest PE W/Cm &/Or Wo Cm  Result Date: 12/03/2022 CLINICAL DATA:  89 year old with history of COPD and lung cancer, presents with shortness of breath and cough with hemoptysis. Pulmonary embolism suspected. EXAM: CT ANGIOGRAPHY CHEST WITH CONTRAST TECHNIQUE: Multidetector CT imaging of the chest was performed using the standard protocol during bolus administration of intravenous contrast. Multiplanar CT image reconstructions and MIPs were obtained to evaluate the vascular anatomy. RADIATION DOSE REDUCTION: This exam was performed according to the departmental dose-optimization program which includes automated exposure control, adjustment of the mA and/or kV according to patient size and/or use of iterative reconstruction technique. CONTRAST:  73mL OMNIPAQUE IOHEXOL 350 MG/ML SOLN COMPARISON:  PA and lateral 12/02/2022, chest radiograph 06/12/2020, CTA chest 06/09/2020 FINDINGS: Cardiovascular: There is no appreciable arterial embolus or dilatation. Left upper lobe arteries  are poorly opacified as before, with chronic left upper lobe collapse again suspected post treatment related and with a small chronic loculated left  apical pleural effusion. There is mild cardiomegaly. There is patchy calcification in the circumflex and LAD coronary arteries and no pericardial effusion. There is moderate mixed plaque in the aorta, proximal great vessels. The pulmonary veins are decompressed. No aortic aneurysm or dissection is seen and no significant aortic or great vessel stenosis. Mediastinum/Nodes: No intrathoracic or axillary adenopathy is seen. No filling defect in the trachea and main bronchi. There is no esophageal thickening. Thyroid gland is atrophic without mass. Lungs/Pleura: Remote right upper lobe hypertrophy. Chronic collapse again noted in the left upper lobe with chronic loculated left apical pleural fluid and with left-sided chronic volume loss, again suspected treatment related. There are emphysematous and scarring changes of the lungs. No new pleural effusion is seen. No pneumothorax. There are increased patchy alveolar infiltrates in the left mid to lower lung field most likely due to interval new pneumonia. Some of the consolidation demonstrates increased cystic changes in the parenchyma which could indicate a necrotizing pneumonic component. On the right, there is increased patchy ground-glass opacity in the basal segments of the lower lobe. This likely indicates additional areas of pneumonitis. Posteromedially in the right upper lobe, there is a paraspinal pleural-based 2.1 x 1.5 cm nodule which previously had a more ground-glass character and is concerning for neoplasm, although it is not significantly changed in overall size. PET-CT evaluation is recommended. There are coarse scarring changes in the right apex. No other significant or new pulmonary opacities are seen. Upper Abdomen: Not well seen due to breathing motion and the patient's overlying left arm creating streak  artifacts. No obvious acute changes. Musculoskeletal: There is osteopenia with mild degenerative change in the thoracic spine. No destructive bone lesion is seen. No chest wall mass. Review of the MIP images confirms the above findings. IMPRESSION: 1. No evidence of arterial embolus, with suboptimal depiction of left upper lobe arteries due to chronic left upper lobe collapse. 2. Chronic left upper lobe collapse with chronic loculated left apical pleural effusion. Old right upper lobectomy. 3. Interval development of patchy alveolar infiltrates in the left mid to lower lung field, with increased cystic changes in the parenchyma which could indicate a necrotizing pneumonic component. 4. Increased patchy ground-glass opacity in the basal segments of the right lower lobe, likely additional areas of pneumonitis. 5. 2.1 x 1.5 cm pleural-based nodule in the posteromedial right upper lobe, previously ground-glass in character and concerning for neoplasm. PET-CT recommended. 6. Aortic and coronary artery atherosclerosis.  Mild cardiomegaly. Electronically Signed   By: Telford Nab M.D.   On: 12/03/2022 00:59    EKG: Independently reviewed. See above  Assessment/Plan CAP without hypoxemia - b/l Opacities on CT imaging suggest of infection with necrotizing component  -ctx/ azithromycin, de-escalate as able  -pulmonary toilet  -urine ag, sputum, f/u on culture data /blood cultures  Right upper lobe pleural based lung Nodule  with concern for neoplasm  -in background of hx lung ca in the last s/p right upper lobectomy  -rec PET scan  -pulmonary consult in am   Hypertension -resume amlodipine /atenolol as bp allows once med rec completed     Mild AKI on  CKDIII -hold nephrotoxic medications -gently hydration    Hypothyroidism -resume synthroid    COPD -no acute exacerbation  - not on controller regimen at home     Hyperlipidemia -continue station   Hx of  CVA -resume secondary ppx  DVT  prophylaxis: heparin Code Status: default full Family Communication: none at bedside Disposition Plan: patient  expected to be admitted greater than 2 midnights  Consults called: pulmonary , Audria Nine MD Admission status: med tele   Clance Boll MD [facility]  If 7PM-7AM, please contact night-coverage [email or web address] Password [email or web address]  12/03/2022, 3:30 AM

## 2022-12-03 NOTE — ED Notes (Signed)
Pt is alert and oriented to self, agitated and refusing medications at this time. Pt kept taking the pills and putting her hand under the covers, pt then dropped the pills in the floor and denied dropping them. Pt then took the medicine cup and threw it in the floor. Pt refusing heparin shot stating, "I can't take shots" Pt unable to tell this RN why. Pt now refusing abx and fluids stating, "I don't want it." Asked pt to please explain why and she stated, "Because I don't like that". Pt remains uncooperative. Notified Pahwani MD.

## 2022-12-03 NOTE — Progress Notes (Addendum)
This is a 89 year old lady with past medical history of CKD, hypertension, hypothyroidism, COPD, CVA, lung cancer s/p right upper lobectomy who presented with cough and shortness of breath for about 1 month.  She was admitted with community-acquired pneumonia but without hypoxia.  CT chest showed bilateral opacities.  She was started on Rocephin and Zithromax.  Patient seen and examined in the ED, she feels comfortable, denies any complaints.  Patient is being a little difficult with the nurses, she is refusing all the care and medications.  I tried to talk to her but she remains very rigid and declined again.  I believe she does have some underlying dementia.  According to H&P, PCCM has also been consulted to evaluate her for lung nodule since she has previous history of lung cancer.  She also has CKD stage IIIb, her baseline creatinine appears to be between 1.1-1.4, her creatinine was 1.46, she did not have AKI, AKI ruled out.  She is also refusing IV fluids.  On my, her lungs are clear to auscultation.  Will await further recommendations from PCCM.  I spent total of 21 minutes on this patient today.  I will also check respiratory viral panel as well as procalcitonin.  She is ruled out of COVID, flu and RSV.

## 2022-12-03 NOTE — ED Notes (Signed)
Called lab to have procal added to previous collection

## 2022-12-03 NOTE — ED Notes (Signed)
Pahwani MD at bedside.

## 2022-12-03 NOTE — ED Notes (Signed)
ED TO INPATIENT HANDOFF REPORT  ED Nurse Name and Phone #: [phone] Sammuel Bailiff RN  S Name/Age/Gender Valerie Young 89 y.o. female Room/Bed: 002C/002C  Code Status   Code Status: Full Code  Home/SNF/Other Home Patient oriented to: self Is this baseline?  unknown     Chief Complaint CAP (community acquired pneumonia) [J18.9]  Triage Note Pt sent by PCP to the ED for pneumonia. Pt states she has had cough with blood tinged sputum and ShOB for a "couple of days."    Allergies Allergies  Allergen Reactions   Sulfamethoxazole-Trimethoprim Rash   Tramadol Hcl     Other reaction(s): mental status changes    Level of Care/Admitting Diagnosis ED Disposition     ED Disposition  Admit   Condition  --   [place]: [place] [100100]  Level of Care: Telemetry Medical [104]  May admit patient to [facility] or [facility] if equivalent level of care is available:: Yes  Covid Evaluation: Confirmed COVID Negative  Diagnosis: CAP (community acquired pneumonia) [885027]  Admitting Physician: Clance Boll [[id number]]  Attending Physician: Clance Boll [[id number]]  Certification:: I certify this patient will need inpatient services for at least 2 midnights  Estimated Length of Stay: 3          B Medical/Surgery History Past Medical History:  Diagnosis Date   Blood clot associated with vein wall inflammation    Cancer ([place])    right lung ca - 30 years ago / left lung - 11/2012   Cancer ([place])    Left lung   CKD (chronic kidney disease)    COPD (chronic obstructive pulmonary disease) ([place])    DJD (degenerative joint disease)    knees   H/O: lung cancer 1982   Right; treated with resection   Hypertension    Hypothyroidism    PND (post-nasal drip)    Chronic   Past Surgical History:  Procedure Laterality Date   ABDOMINAL HYSTERECTOMY     PARTIAL   CATARACT EXTRACTION, BILATERAL     JOINT REPLACEMENT     RIGHT TOTAL  KNEE   LOOP RECORDER INSERTION N/A 06/12/2020   Procedure: LOOP RECORDER INSERTION;  Surgeon: Deboraha Sprang, MD;  Location: [facility];  Service: Cardiovascular;  Laterality: N/A;   RADIATION OF LEFT LUNG     right lung cancer treated with resection  1982   right thigh postoperative hematoma  02-2008   RIGHT UPPER LOBE REMOVED     THYROID SURGERY     THYROIDECTOMY     TOTAL KNEE ARTHROPLASTY     Right   VESICOVAGINAL FISTULA CLOSURE W/ TAH     VIDEO BRONCHOSCOPY  12/08/2012   Procedure: VIDEO BRONCHOSCOPY WITH FLUORO;  Surgeon: Kathee Delton, MD;  Location: [facility];  Service: Cardiopulmonary;  Laterality: Bilateral;     A IV Location/Drains/Wounds Patient Lines/Drains/Airways Status     Active Line/Drains/Airways     Name Placement date Placement time Site Days   Peripheral IV 12/02/22 20 G 1" Anterior;Right Forearm 12/02/22  2305  Forearm  1            Intake/Output Last 24 hours  Intake/Output Summary (Last 24 hours) at 12/03/2022 1836 Last data filed at 12/03/2022 1418 Gross per 24 hour  Intake 219.47 ml  Output --  Net 219.47 ml    Labs/Imaging Results for orders placed or performed during the hospital encounter of 12/02/22 (from the past 48  hour(s))  Resp panel by RT-PCR (RSV, Flu A&B, Covid) Anterior Nasal Swab     Status: None   Collection Time: 12/02/22 11:06 PM   Specimen: Anterior Nasal Swab  Result Value Ref Range   SARS Coronavirus 2 by RT PCR NEGATIVE NEGATIVE    Comment: (NOTE) SARS-CoV-2 target nucleic acids are NOT DETECTED.  The SARS-CoV-2 RNA is generally detectable in upper respiratory specimens during the acute phase of infection. The lowest concentration of SARS-CoV-2 viral copies this assay can detect is 138 copies/mL. A negative result does not preclude SARS-Cov-2 infection and should not be used as the sole basis for treatment or other patient management decisions. A negative result may occur with  improper specimen  collection/handling, submission of specimen other than nasopharyngeal swab, presence of viral mutation(s) within the areas targeted by this assay, and inadequate number of viral copies(<138 copies/mL). A negative result must be combined with clinical observations, patient history, and epidemiological information. The expected result is Negative.  Fact Sheet for Patients:  EntrepreneurPulse.com.au  Fact Sheet for Healthcare Providers:  IncredibleEmployment.be  This test is no t yet approved or cleared by the [place] FDA and  has been authorized for detection and/or diagnosis of SARS-CoV-2 by FDA under an Emergency Use Authorization (EUA). This EUA will remain  in effect (meaning this test can be used) for the duration of the COVID-19 declaration under Section 564(b)(1) of the Act, 21 U.S.C.section 360bbb-3(b)(1), unless the authorization is terminated  or revoked sooner.       Influenza A by PCR NEGATIVE NEGATIVE   Influenza B by PCR NEGATIVE NEGATIVE    Comment: (NOTE) The Xpert Xpress SARS-CoV-2/FLU/RSV plus assay is intended as an aid in the diagnosis of influenza from Nasopharyngeal swab specimens and should not be used as a sole basis for treatment. Nasal washings and aspirates are unacceptable for Xpert Xpress SARS-CoV-2/FLU/RSV testing.  Fact Sheet for Patients: EntrepreneurPulse.com.au  Fact Sheet for Healthcare Providers: IncredibleEmployment.be  This test is not yet approved or cleared by the [place] FDA and has been authorized for detection and/or diagnosis of SARS-CoV-2 by FDA under an Emergency Use Authorization (EUA). This EUA will remain in effect (meaning this test can be used) for the duration of the COVID-19 declaration under Section 564(b)(1) of the Act, 21 U.S.C. section 360bbb-3(b)(1), unless the authorization is terminated or revoked.     Resp Syncytial Virus by PCR  NEGATIVE NEGATIVE    Comment: (NOTE) Fact Sheet for Patients: EntrepreneurPulse.com.au  Fact Sheet for Healthcare Providers: IncredibleEmployment.be  This test is not yet approved or cleared by the [place] FDA and has been authorized for detection and/or diagnosis of SARS-CoV-2 by FDA under an Emergency Use Authorization (EUA). This EUA will remain in effect (meaning this test can be used) for the duration of the COVID-19 declaration under Section 564(b)(1) of the Act, 21 U.S.C. section 360bbb-3(b)(1), unless the authorization is terminated or revoked.  Performed at [facility], [place] [address], [place], [place] [address]   Comprehensive metabolic panel     Status: Abnormal   Collection Time: 12/02/22 11:06 PM  Result Value Ref Range   Sodium 137 135 - 145 mmol/L   Potassium 4.4 3.5 - 5.1 mmol/L   Chloride 102 98 - 111 mmol/L   CO2 27 22 - 32 mmol/L   Glucose, Bld 133 (H) 70 - 99 mg/dL    Comment: Glucose reference range applies only to samples taken after fasting for at least 8 hours.  BUN 24 (H) 8 - 23 mg/dL   Creatinine, Ser 1.46 (H) 0.44 - 1.00 mg/dL   Calcium 9.1 8.9 - 10.3 mg/dL   Total Protein 7.3 6.5 - 8.1 g/dL   Albumin 3.5 3.5 - 5.0 g/dL   AST 22 15 - 41 U/L   ALT 13 0 - 44 U/L   Alkaline Phosphatase 87 38 - 126 U/L   Total Bilirubin 0.6 0.3 - 1.2 mg/dL   GFR, Estimated 34 (L) >60 mL/min    Comment: (NOTE) Calculated using the CKD-EPI Creatinine Equation (2021)    Anion gap 8 5 - 15    Comment: Performed at [facility] [address] [place], [place] [address]  CBC     Status: Abnormal   Collection Time: 12/02/22 11:06 PM  Result Value Ref Range   WBC 15.8 (H) 4.0 - 10.5 K/uL   RBC 3.90 3.87 - 5.11 MIL/uL   Hemoglobin 12.2 12.0 - 15.0 g/dL   HCT 38.5 36.0 - 46.0 %   MCV 98.7 80.0 - 100.0 fL   MCH 31.3 26.0 - 34.0 pg   MCHC 31.7 30.0 - 36.0 g/dL   RDW 13.6 11.5 - 15.5 %   Platelets 272 150 - 400  K/uL   nRBC 0.0 0.0 - 0.2 %    Comment: Performed at [facility], [place] [address], [place], [place] [address]  Lactic acid, plasma     Status: None   Collection Time: 12/02/22 11:06 PM  Result Value Ref Range   Lactic Acid, Venous 1.7 0.5 - 1.9 mmol/L    Comment: Performed at [facility] [address], [place], [place] [address]  Respiratory (~20 pathogens) panel by PCR     Status: Abnormal   Collection Time: 12/02/22 11:06 PM   Specimen: Nasopharyngeal Swab; Respiratory  Result Value Ref Range   Adenovirus NOT DETECTED NOT DETECTED   Coronavirus 229E NOT DETECTED NOT DETECTED    Comment: (NOTE) The Coronavirus on the Respiratory Panel, DOES NOT test for the novel  Coronavirus (2019 nCoV)    Coronavirus HKU1 NOT DETECTED NOT DETECTED   Coronavirus NL63 NOT DETECTED NOT DETECTED   Coronavirus OC43 NOT DETECTED NOT DETECTED   Metapneumovirus NOT DETECTED NOT DETECTED   Rhinovirus / Enterovirus NOT DETECTED NOT DETECTED   Influenza A NOT DETECTED NOT DETECTED   Influenza B NOT DETECTED NOT DETECTED   Parainfluenza Virus 1 NOT DETECTED NOT DETECTED   Parainfluenza Virus 2 NOT DETECTED NOT DETECTED   Parainfluenza Virus 3 NOT DETECTED NOT DETECTED   Parainfluenza Virus 4 DETECTED (A) NOT DETECTED   Respiratory Syncytial Virus NOT DETECTED NOT DETECTED   Bordetella pertussis NOT DETECTED NOT DETECTED   Bordetella Parapertussis NOT DETECTED NOT DETECTED   Chlamydophila pneumoniae NOT DETECTED NOT DETECTED   Mycoplasma pneumoniae NOT DETECTED NOT DETECTED    Comment: Performed at [facility], [place] [address] [place],  [address]  I-stat chem 8, ED (not at [facility], [facility] or [facility])     Status: Abnormal   Collection Time: 12/02/22 11:23 PM  Result Value Ref Range   Sodium 140 135 - 145 mmol/L   Potassium 5.1 3.5 - 5.1 mmol/L   Chloride 103 98 - 111 mmol/L   BUN 27 (H) 8 - 23 mg/dL   Creatinine, Ser 1.50 (H) 0.44 - 1.00 mg/dL   Glucose, Bld 133 (H) 70 - 99 mg/dL     Comment: Glucose reference range applies only  to samples taken after fasting for at least 8 hours.   Calcium, Ion 1.23 1.15 - 1.40 mmol/L   TCO2 26 22 - 32 mmol/L   Hemoglobin 13.6 12.0 - 15.0 g/dL   HCT 40.0 36.0 - 46.0 %  I-Stat venous blood gas, ([facility] ED, [facility], [facility])     Status: Abnormal   Collection Time: 12/02/22 11:24 PM  Result Value Ref Range   pH, Ven 7.384 7.25 - 7.43   pCO2, Ven 49.5 44 - 60 mmHg   pO2, Ven 29 (LL) 32 - 45 mmHg   Bicarbonate 29.5 (H) 20.0 - 28.0 mmol/L   TCO2 31 22 - 32 mmol/L   O2 Saturation 54 %   Acid-Base Excess 4.0 (H) 0.0 - 2.0 mmol/L   Sodium 138 135 - 145 mmol/L   Potassium 5.7 (H) 3.5 - 5.1 mmol/L   Calcium, Ion 1.19 1.15 - 1.40 mmol/L   HCT 38.0 36.0 - 46.0 %   Hemoglobin 12.9 12.0 - 15.0 g/dL   Sample type VENOUS    Comment NOTIFIED PHYSICIAN   Lactic acid, plasma     Status: None   Collection Time: 12/03/22  7:04 AM  Result Value Ref Range   Lactic Acid, Venous 1.7 0.5 - 1.9 mmol/L    Comment: Performed at [facility], [place] [address], [place], [place] [address]  HIV Antibody (routine testing w rflx)     Status: None   Collection Time: 12/03/22  7:04 AM  Result Value Ref Range   HIV Screen 4th Generation wRfx Non Reactive Non Reactive    Comment: Performed at [facility], [place] [address], [place], [place] [address]  Magnesium     Status: None   Collection Time: 12/03/22  7:04 AM  Result Value Ref Range   Magnesium 2.0 1.7 - 2.4 mg/dL    Comment: Performed at [facility] [address], [place], [place] [address]  Comprehensive metabolic panel     Status: Abnormal   Collection Time: 12/03/22  7:04 AM  Result Value Ref Range   Sodium 137 135 - 145 mmol/L   Potassium 4.4 3.5 - 5.1 mmol/L   Chloride 104 98 - 111 mmol/L   CO2 25 22 - 32 mmol/L   Glucose, Bld 121 (H) 70 - 99 mg/dL    Comment: Glucose reference range applies only to samples taken after fasting for at least 8 hours.   BUN 21 8 - 23 mg/dL   Creatinine, Ser  1.43 (H) 0.44 - 1.00 mg/dL   Calcium 8.8 (L) 8.9 - 10.3 mg/dL   Total Protein 6.1 (L) 6.5 - 8.1 g/dL   Albumin 2.9 (L) 3.5 - 5.0 g/dL   AST 23 15 - 41 U/L   ALT 11 0 - 44 U/L   Alkaline Phosphatase 70 38 - 126 U/L   Total Bilirubin 0.4 0.3 - 1.2 mg/dL   GFR, Estimated 35 (L) >60 mL/min    Comment: (NOTE) Calculated using the CKD-EPI Creatinine Equation (2021)    Anion gap 8 5 - 15    Comment: Performed at [facility], [place] [address] [place], [place] [address]  Phosphorus     Status: None   Collection Time: 12/03/22  7:04 AM  Result Value Ref Range   Phosphorus 4.0 2.5 - 4.6 mg/dL    Comment: Performed at [facility] [address], [place], [place] [address]  CBC with Differential/Platelet     Status:  Abnormal   Collection Time: 12/03/22  7:04 AM  Result Value Ref Range   WBC 15.8 (H) 4.0 - 10.5 K/uL   RBC 3.43 (L) 3.87 - 5.11 MIL/uL   Hemoglobin 10.8 (L) 12.0 - 15.0 g/dL   HCT 33.8 (L) 36.0 - 46.0 %   MCV 98.5 80.0 - 100.0 fL   MCH 31.5 26.0 - 34.0 pg   MCHC 32.0 30.0 - 36.0 g/dL   RDW 13.6 11.5 - 15.5 %   Platelets 224 150 - 400 K/uL   nRBC 0.0 0.0 - 0.2 %   Neutrophils Relative % 78 %   Neutro Abs 12.2 (H) 1.7 - 7.7 K/uL   Lymphocytes Relative 11 %   Lymphs Abs 1.8 0.7 - 4.0 K/uL   Monocytes Relative 11 %   Monocytes Absolute 1.8 (H) 0.1 - 1.0 K/uL   Eosinophils Relative 0 %   Eosinophils Absolute 0.0 0.0 - 0.5 K/uL   Basophils Relative 0 %   Basophils Absolute 0.0 0.0 - 0.1 K/uL   Immature Granulocytes 0 %   Abs Immature Granulocytes 0.07 0.00 - 0.07 K/uL    Comment: Performed at [facility], [address], [place], [place] [address]  Protime-INR     Status: None   Collection Time: 12/03/22  7:04 AM  Result Value Ref Range   Prothrombin Time 14.5 11.4 - 15.2 seconds   INR 1.1 0.8 - 1.2    Comment: (NOTE) INR goal varies based on device and disease states. Performed at [facility], [place] [address], [place], [place] [address]    Procalcitonin - Baseline     Status: None   Collection Time: 12/03/22  7:04 AM  Result Value Ref Range   Procalcitonin 0.11 ng/mL    Comment:        Interpretation: PCT (Procalcitonin) <= 0.5 ng/mL: Systemic infection (sepsis) is not likely. Local bacterial infection is possible. (NOTE)       Sepsis PCT Algorithm           Lower Respiratory Tract                                      Infection PCT Algorithm    ----------------------------     ----------------------------         PCT < 0.25 ng/mL                PCT < 0.10 ng/mL          Strongly encourage             Strongly discourage   discontinuation of antibiotics    initiation of antibiotics    ----------------------------     -----------------------------       PCT 0.25 - 0.50 ng/mL            PCT 0.10 - 0.25 ng/mL               OR       >80% decrease in PCT            Discourage initiation of                                            antibiotics      Encourage discontinuation  of antibiotics    ----------------------------     -----------------------------         PCT >= 0.50 ng/mL              PCT 0.26 - 0.50 ng/mL               AND        <80% decrease in PCT             Encourage initiation of                                             antibiotics       Encourage continuation           of antibiotics    ----------------------------     -----------------------------        PCT >= 0.50 ng/mL                  PCT > 0.50 ng/mL               AND         increase in PCT                  Strongly encourage                                      initiation of antibiotics    Strongly encourage escalation           of antibiotics                                     -----------------------------                                           PCT <= 0.25 ng/mL                                                 OR                                        > 80% decrease in PCT                                      Discontinue /  Do not initiate                                             antibiotics  Performed at [facility], [address] [place], [place] [address]    CT Angio Chest PE W/Cm &/Or Wo Cm  Result Date: 12/03/2022 CLINICAL DATA:  89 year old with history of COPD and lung cancer, presents with shortness of breath  and cough with hemoptysis. Pulmonary embolism suspected. EXAM: CT ANGIOGRAPHY CHEST WITH CONTRAST TECHNIQUE: Multidetector CT imaging of the chest was performed using the standard protocol during bolus administration of intravenous contrast. Multiplanar CT image reconstructions and MIPs were obtained to evaluate the vascular anatomy. RADIATION DOSE REDUCTION: This exam was performed according to the departmental dose-optimization program which includes automated exposure control, adjustment of the mA and/or kV according to patient size and/or use of iterative reconstruction technique. CONTRAST:  46mL OMNIPAQUE IOHEXOL 350 MG/ML SOLN COMPARISON:  PA and lateral 12/02/2022, chest radiograph 06/12/2020, CTA chest 06/09/2020 FINDINGS: Cardiovascular: There is no appreciable arterial embolus or dilatation. Left upper lobe arteries are poorly opacified as before, with chronic left upper lobe collapse again suspected post treatment related and with a small chronic loculated left apical pleural effusion. There is mild cardiomegaly. There is patchy calcification in the circumflex and LAD coronary arteries and no pericardial effusion. There is moderate mixed plaque in the aorta, proximal great vessels. The pulmonary veins are decompressed. No aortic aneurysm or dissection is seen and no significant aortic or great vessel stenosis. Mediastinum/Nodes: No intrathoracic or axillary adenopathy is seen. No filling defect in the trachea and main bronchi. There is no esophageal thickening. Thyroid gland is atrophic without mass. Lungs/Pleura: Remote right upper lobe hypertrophy. Chronic collapse again noted in the left  upper lobe with chronic loculated left apical pleural fluid and with left-sided chronic volume loss, again suspected treatment related. There are emphysematous and scarring changes of the lungs. No new pleural effusion is seen. No pneumothorax. There are increased patchy alveolar infiltrates in the left mid to lower lung field most likely due to interval new pneumonia. Some of the consolidation demonstrates increased cystic changes in the parenchyma which could indicate a necrotizing pneumonic component. On the right, there is increased patchy ground-glass opacity in the basal segments of the lower lobe. This likely indicates additional areas of pneumonitis. Posteromedially in the right upper lobe, there is a paraspinal pleural-based 2.1 x 1.5 cm nodule which previously had a more ground-glass character and is concerning for neoplasm, although it is not significantly changed in overall size. PET-CT evaluation is recommended. There are coarse scarring changes in the right apex. No other significant or new pulmonary opacities are seen. Upper Abdomen: Not well seen due to breathing motion and the patient's overlying left arm creating streak artifacts. No obvious acute changes. Musculoskeletal: There is osteopenia with mild degenerative change in the thoracic spine. No destructive bone lesion is seen. No chest wall mass. Review of the MIP images confirms the above findings. IMPRESSION: 1. No evidence of arterial embolus, with suboptimal depiction of left upper lobe arteries due to chronic left upper lobe collapse. 2. Chronic left upper lobe collapse with chronic loculated left apical pleural effusion. Old right upper lobectomy. 3. Interval development of patchy alveolar infiltrates in the left mid to lower lung field, with increased cystic changes in the parenchyma which could indicate a necrotizing pneumonic component. 4. Increased patchy ground-glass opacity in the basal segments of the right lower lobe, likely  additional areas of pneumonitis. 5. 2.1 x 1.5 cm pleural-based nodule in the posteromedial right upper lobe, previously ground-glass in character and concerning for neoplasm. PET-CT recommended. 6. Aortic and coronary artery atherosclerosis.  Mild cardiomegaly. Electronically Signed   By: Telford Nab M.D.   On: 12/03/2022 00:59    Pending Labs Unresulted Labs (From admission, onward)     Start     Ordered   12/04/22 0500  Basic metabolic panel  Tomorrow morning,   R        12/03/22 1100   12/04/22 0500  CBC with Differential/Platelet  Tomorrow morning,   R        12/03/22 1100   12/04/22 0500  Magnesium  Tomorrow morning,   R        12/03/22 1100   12/03/22 1100  Respiratory (~20 pathogens) panel by PCR  (Respiratory panel by PCR (~20 pathogens, ~24 hr TAT)  w precautions)  Once,   R        12/03/22 1059   12/03/22 0500  CBC with Differential/Platelet  Tomorrow morning,   R        12/03/22 0414   12/03/22 0413  Legionella Pneumophila Serogp 1 Ur Ag  Once,   R        12/03/22 0414   12/02/22 2317  Blood culture (routine x 2)  BLOOD CULTURE X 2,   R (with STAT occurrences)      12/02/22 2317            Vitals/Pain Today's Vitals   12/03/22 1500 12/03/22 1600 12/03/22 1800 12/03/22 1830  BP: (!) 123/105 (!) 132/113 (!) 156/87   Pulse: 76 88 96 92  Resp: 18 (!) 23 (!) 29 (!) 23  Temp: 99.8 F (37.7 C)     TempSrc:      SpO2: 96% 91% 95% 95%  Weight:      Height:      PainSc:        Isolation Precautions Droplet precaution  Medications Medications  heparin injection 5,000 Units (5,000 Units Subcutaneous Given 12/03/22 1327)  cefTRIAXone (ROCEPHIN) 2 g in sodium chloride 0.9 % 100 mL IVPB (0 g Intravenous Stopped 12/03/22 1357)  azithromycin (ZITHROMAX) 500 mg in sodium chloride 0.9 % 250 mL IVPB (has no administration in time range)  0.9 %  sodium chloride infusion ( Intravenous Infusion Verify 12/03/22 1418)  atorvastatin (LIPITOR) tablet 10 mg (10 mg Oral Patient  Refused/Not Given 12/03/22 0858)  levothyroxine (SYNTHROID) tablet 50 mcg (50 mcg Oral Patient Refused/Not Given 12/03/22 0858)  ipratropium-albuterol (DUONEB) 0.5-2.5 (3) MG/3ML nebulizer solution 3 mL (3 mLs Nebulization Given 12/03/22 0033)  cefTRIAXone (ROCEPHIN) 1 g in sodium chloride 0.9 % 100 mL IVPB (0 g Intravenous Stopped 12/03/22 0151)  azithromycin (ZITHROMAX) tablet 500 mg (500 mg Oral Given 12/03/22 0114)  iohexol (OMNIPAQUE) 350 MG/ML injection 60 mL (60 mLs Intravenous Contrast Given 12/03/22 0005)  acetaminophen (TYLENOL) tablet 1,000 mg (1,000 mg Oral Given 12/03/22 0152)  lactated ringers bolus 1,000 mL (0 mLs Intravenous Stopped 12/03/22 0440)    Mobility walks with person assist High fall risk   Focused Assessments Pulmonary Assessment Handoff:  Lung sounds: Bilateral Breath Sounds: Rhonchi, Diminished, Clear (rhonchi anterior upper lobes, diminished and clear throughout the rest) L Breath Sounds: Expiratory wheezes R Breath Sounds: Expiratory wheezes O2 Device: Nasal Cannula O2 Flow Rate (L/min): 2 L/min    R Recommendations: See Admitting Provider Note  Report given to:   Additional Notes: Pt's son convinced pt to take her medications. Pt is more cooperative w/ care at this time.

## 2022-12-04 ENCOUNTER — Telehealth: Payer: Self-pay | Admitting: Pulmonary Disease

## 2022-12-04 DIAGNOSIS — J850 Gangrene and necrosis of lung: Secondary | ICD-10-CM | POA: Diagnosis not present

## 2022-12-04 DIAGNOSIS — J189 Pneumonia, unspecified organism: Secondary | ICD-10-CM | POA: Diagnosis not present

## 2022-12-04 LAB — CBC WITH DIFFERENTIAL/PLATELET
Abs Immature Granulocytes: 0.04 10*3/uL (ref 0.00–0.07)
Basophils Absolute: 0.1 10*3/uL (ref 0.0–0.1)
Basophils Relative: 1 %
Eosinophils Absolute: 0 10*3/uL (ref 0.0–0.5)
Eosinophils Relative: 0 %
HCT: 38.1 % (ref 36.0–46.0)
Hemoglobin: 11.6 g/dL — ABNORMAL LOW (ref 12.0–15.0)
Immature Granulocytes: 0 %
Lymphocytes Relative: 24 %
Lymphs Abs: 3.4 10*3/uL (ref 0.7–4.0)
MCH: 30.5 pg (ref 26.0–34.0)
MCHC: 30.4 g/dL (ref 30.0–36.0)
MCV: 100.3 fL — ABNORMAL HIGH (ref 80.0–100.0)
Monocytes Absolute: 1.5 10*3/uL — ABNORMAL HIGH (ref 0.1–1.0)
Monocytes Relative: 10 %
Neutro Abs: 9.3 10*3/uL — ABNORMAL HIGH (ref 1.7–7.7)
Neutrophils Relative %: 65 %
Platelets: 271 10*3/uL (ref 150–400)
RBC: 3.8 MIL/uL — ABNORMAL LOW (ref 3.87–5.11)
RDW: 13.8 % (ref 11.5–15.5)
WBC: 14.4 10*3/uL — ABNORMAL HIGH (ref 4.0–10.5)
nRBC: 0 % (ref 0.0–0.2)

## 2022-12-04 LAB — BASIC METABOLIC PANEL
Anion gap: 12 (ref 5–15)
BUN: 19 mg/dL (ref 8–23)
CO2: 22 mmol/L (ref 22–32)
Calcium: 9.1 mg/dL (ref 8.9–10.3)
Chloride: 103 mmol/L (ref 98–111)
Creatinine, Ser: 1.39 mg/dL — ABNORMAL HIGH (ref 0.44–1.00)
GFR, Estimated: 36 mL/min — ABNORMAL LOW (ref >60)
Glucose, Bld: 98 mg/dL (ref 70–99)
Potassium: 3.4 mmol/L — ABNORMAL LOW (ref 3.5–5.1)
Sodium: 137 mmol/L (ref 135–145)

## 2022-12-04 LAB — MAGNESIUM: Magnesium: 2 mg/dL (ref 1.7–2.4)

## 2022-12-04 LAB — MRSA NEXT GEN BY PCR, NASAL: MRSA by PCR Next Gen: NOT DETECTED

## 2022-12-04 MED ORDER — IPRATROPIUM-ALBUTEROL 0.5-2.5 (3) MG/3ML IN SOLN: 3.0000 mL | Freq: Four times a day (QID) | RESPIRATORY_TRACT | Status: DC | PRN

## 2022-12-04 MED ORDER — UMECLIDINIUM-VILANTEROL 62.5-25 MCG/ACT IN AEPB
1.0000 | INHALATION_SPRAY | Freq: Every day | RESPIRATORY_TRACT | Status: DC
  Filled 2022-12-04: qty 14

## 2022-12-04 MED ORDER — UMECLIDINIUM-VILANTEROL 62.5-25 MCG/ACT IN AEPB
1.0000 | INHALATION_SPRAY | Freq: Every day | RESPIRATORY_TRACT | Status: DC
  Administered 2022-12-05 – 2022-12-06 (×2): 1 via RESPIRATORY_TRACT
  Filled 2022-12-04: qty 14

## 2022-12-04 MED ORDER — SODIUM CHLORIDE 0.9 % IV SOLN
3.0000 g | Freq: Two times a day (BID) | INTRAVENOUS | Status: DC
  Administered 2022-12-04 – 2022-12-06 (×4): 3 g via INTRAVENOUS
  Filled 2022-12-04 (×4): qty 8

## 2022-12-04 NOTE — Progress Notes (Addendum)
PROGRESS NOTE    Valerie Young  WJX:[id number] DOB: [birth year] DOA: 12/02/2022 PCP: Lois Huxley, PA   Brief Narrative:  89 y.o. female with medical history significant of  Hypertension, CKDIII,hypothyroidism, COPD, Hyperlipidemia, CVA, lung ca s/p right upper lobectomy presented with worsening cough and shortness of breath.  On presentation, WBC was 15.8, COVID-19/influenza/RSV PCR were negative.  CTA chest showed no evidence of PE but showed interval development of patchy alveolar infiltrates in the left mid to lower lung field, with increased cystic changes in the parenchyma which could indicate upper necrotizing pneumonic component along with 2.1 x 1.5 cm pleural-based nodule in the posterior medial right upper lobe, previously groundglass and corrector and concerning for neoplasm.  She was started on IV antibiotics.  Assessment & Plan:   Possible necrotizing pneumonia Acute respiratory failure with hypoxia Parainfluenza pneumonia -As evidenced on CTA chest.  -COVID-19/influenza/RSV PCR were negative.  Respiratory panel PCR was positive for parainfluenza -Currently on Rocephin and Zithromax.  I have consulted pulmonary.  Follow recommendations.  Blood cultures negative so far -Currently on 3 L oxygen by nasal cannula.  Wean off as able.  Leukocytosis -Improving.  Monitor.  Right upper lobe pleural-based nodule with concern for neoplasm -In the background of history of lung cancer in the past status post right upper lobectomy -Will possibly need outpatient PET scan -Follow pulmonary recommendations  Hypertension -Blood pressure currently stable.  Amlodipine/atenolol on hold  Mild AKI on CKD stage IIIb -Treated with IV fluids.  Improving.  Monitor.  Hypothyroidism Continue Synthroid  Hyperlipidemia Continue statin  COPD -stable.  Use nebs as needed  History of CVA, unspecified -Continue statin.  Goals of care -Consult palliative care for goals of care  discussion  Physical deconditioning -PT eval   DVT prophylaxis: Heparin subcutaneous Code Status: Full Family Communication: None at bedside Disposition Plan: Status is: Inpatient Remains inpatient appropriate because: Of severity of illness    Consultants: PCCM/palliative care  Procedures: None  Antimicrobials: Rocephin and Zithromax from 12/02/2022 onwards   Subjective: Patient seen and examined at bedside.  Poor historian.  Slow to respond.  No fever, seizures, agitation reported.  Breathing is improving.  Objective: Vitals:   12/03/22 2031 12/04/22 0053 12/04/22 0400 12/04/22 0907  BP:  (!) 122/49 (!) 133/51 (!) 131/55  Pulse:  77 78 75  Resp:  16 16 16   Temp: 98.6 F (37 C) 99.9 F (37.7 C) 98.4 F (36.9 C)   TempSrc: Oral Oral Oral   SpO2: (!) 87% 100%  100%  Weight:      Height:        Intake/Output Summary (Last 24 hours) at 12/04/2022 1112 Last data filed at 12/04/2022 0411 Gross per 24 hour  Intake 913.53 ml  Output --  Net 913.53 ml   Filed Weights   12/02/22 2210  Weight: 45.4 kg    Examination:  General exam: Appears calm and comfortable.  On 3 L oxygen via nasal cannula.  Looks chronically ill and deconditioned. Respiratory system: Bilateral decreased breath sounds at bases with some scattered crackles Cardiovascular system: S1 & S2 heard, Rate controlled Gastrointestinal system: Abdomen is nondistended, soft and nontender. Normal bowel sounds heard. Extremities: No cyanosis, clubbing, edema  Central nervous system: Awake, slow to respond, poor historian.  No focal neurological deficits. Moving extremities Skin: No rashes, lesions or ulcers Psychiatry: Flat affect.  Not agitated.    Data Reviewed: I have personally reviewed following labs and imaging studies  CBC: Recent Labs  Lab 12/02/22 2306 12/02/22 2323 12/02/22 2324 12/03/22 0704 12/04/22 0627  WBC 15.8*  --   --  15.8* 14.4*  NEUTROABS  --   --   --  12.2* 9.3*  HGB 12.2  13.6 12.9 10.8* 11.6*  HCT 38.5 40.0 38.0 33.8* 38.1  MCV 98.7  --   --  98.5 100.3*  PLT 272  --   --  224 841   Basic Metabolic Panel: Recent Labs  Lab 12/02/22 2306 12/02/22 2323 12/02/22 2324 12/03/22 0704 12/04/22 0627  NA 137 140 138 137 137  K 4.4 5.1 5.7* 4.4 3.4*  CL 102 103  --  104 103  CO2 27  --   --  25 22  GLUCOSE 133* 133*  --  121* 98  BUN 24* 27*  --  21 19  CREATININE 1.46* 1.50*  --  1.43* 1.39*  CALCIUM 9.1  --   --  8.8* 9.1  MG  --   --   --  2.0 2.0  PHOS  --   --   --  4.0  --    GFR: Estimated Creatinine Clearance: 18.9 mL/min (A) (by C-G formula based on SCr of 1.39 mg/dL (H)). Liver Function Tests: Recent Labs  Lab 12/02/22 2306 12/03/22 0704  AST 22 23  ALT 13 11  ALKPHOS 87 70  BILITOT 0.6 0.4  PROT 7.3 6.1*  ALBUMIN 3.5 2.9*   No results for input(s): "LIPASE", "AMYLASE" in the last 168 hours. No results for input(s): "AMMONIA" in the last 168 hours. Coagulation Profile: Recent Labs  Lab 12/03/22 0704  INR 1.1   Cardiac Enzymes: No results for input(s): "CKTOTAL", "CKMB", "CKMBINDEX", "TROPONINI" in the last 168 hours. BNP (last 3 results) No results for input(s): "PROBNP" in the last 8760 hours. HbA1C: No results for input(s): "HGBA1C" in the last 72 hours. CBG: No results for input(s): "GLUCAP" in the last 168 hours. Lipid Profile: No results for input(s): "CHOL", "HDL", "LDLCALC", "TRIG", "CHOLHDL", "LDLDIRECT" in the last 72 hours. Thyroid Function Tests: No results for input(s): "TSH", "T4TOTAL", "FREET4", "T3FREE", "THYROIDAB" in the last 72 hours. Anemia Panel: No results for input(s): "VITAMINB12", "FOLATE", "FERRITIN", "TIBC", "IRON", "RETICCTPCT" in the last 72 hours. Sepsis Labs: Recent Labs  Lab 12/02/22 2306 12/03/22 0704  PROCALCITON  --  0.11  LATICACIDVEN 1.7 1.7    Recent Results (from the past 240 hour(s))  Resp panel by RT-PCR (RSV, Flu A&B, Covid) Anterior Nasal Swab     Status: None   Collection  Time: 12/02/22 11:06 PM   Specimen: Anterior Nasal Swab  Result Value Ref Range Status   SARS Coronavirus 2 by RT PCR NEGATIVE NEGATIVE Final    Comment: (NOTE) SARS-CoV-2 target nucleic acids are NOT DETECTED.  The SARS-CoV-2 RNA is generally detectable in upper respiratory specimens during the acute phase of infection. The lowest concentration of SARS-CoV-2 viral copies this assay can detect is 138 copies/mL. A negative result does not preclude SARS-Cov-2 infection and should not be used as the sole basis for treatment or other patient management decisions. A negative result may occur with  improper specimen collection/handling, submission of specimen other than nasopharyngeal swab, presence of viral mutation(s) within the areas targeted by this assay, and inadequate number of viral copies(<138 copies/mL). A negative result must be combined with clinical observations, patient history, and epidemiological information. The expected result is Negative.  Fact Sheet for Patients:  EntrepreneurPulse.com.au  Fact Sheet for Healthcare Providers:  IncredibleEmployment.be  This  test is no t yet approved or cleared by the [place] and  has been authorized for detection and/or diagnosis of SARS-CoV-2 by FDA under an Emergency Use Authorization (EUA). This EUA will remain  in effect (meaning this test can be used) for the duration of the COVID-19 declaration under Section 564(b)(1) of the Act, 21 U.S.C.section 360bbb-3(b)(1), unless the authorization is terminated  or revoked sooner.       Influenza A by PCR NEGATIVE NEGATIVE Final   Influenza B by PCR NEGATIVE NEGATIVE Final    Comment: (NOTE) The Xpert Xpress SARS-CoV-2/FLU/RSV plus assay is intended as an aid in the diagnosis of influenza from Nasopharyngeal swab specimens and should not be used as a sole basis for treatment. Nasal washings and aspirates are unacceptable for Xpert Xpress  SARS-CoV-2/FLU/RSV testing.  Fact Sheet for Patients: EntrepreneurPulse.com.au  Fact Sheet for Healthcare Providers: IncredibleEmployment.be  This test is not yet approved or cleared by the [place] FDA and has been authorized for detection and/or diagnosis of SARS-CoV-2 by FDA under an Emergency Use Authorization (EUA). This EUA will remain in effect (meaning this test can be used) for the duration of the COVID-19 declaration under Section 564(b)(1) of the Act, 21 U.S.C. section 360bbb-3(b)(1), unless the authorization is terminated or revoked.     Resp Syncytial Virus by PCR NEGATIVE NEGATIVE Final    Comment: (NOTE) Fact Sheet for Patients: EntrepreneurPulse.com.au  Fact Sheet for Healthcare Providers: IncredibleEmployment.be  This test is not yet approved or cleared by the [place] FDA and has been authorized for detection and/or diagnosis of SARS-CoV-2 by FDA under an Emergency Use Authorization (EUA). This EUA will remain in effect (meaning this test can be used) for the duration of the COVID-19 declaration under Section 564(b)(1) of the Act, 21 U.S.C. section 360bbb-3(b)(1), unless the authorization is terminated or revoked.  Performed at [facility], [place] [address], [place], [place] [address]   Respiratory (~20 pathogens) panel by PCR     Status: Abnormal   Collection Time: 12/02/22 11:06 PM   Specimen: Nasopharyngeal Swab; Respiratory  Result Value Ref Range Status   Adenovirus NOT DETECTED NOT DETECTED Final   Coronavirus 229E NOT DETECTED NOT DETECTED Final    Comment: (NOTE) The Coronavirus on the Respiratory Panel, DOES NOT test for the novel  Coronavirus (2019 nCoV)    Coronavirus HKU1 NOT DETECTED NOT DETECTED Final   Coronavirus NL63 NOT DETECTED NOT DETECTED Final   Coronavirus OC43 NOT DETECTED NOT DETECTED Final   Metapneumovirus NOT DETECTED NOT DETECTED Final    Rhinovirus / Enterovirus NOT DETECTED NOT DETECTED Final   Influenza A NOT DETECTED NOT DETECTED Final   Influenza B NOT DETECTED NOT DETECTED Final   Parainfluenza Virus 1 NOT DETECTED NOT DETECTED Final   Parainfluenza Virus 2 NOT DETECTED NOT DETECTED Final   Parainfluenza Virus 3 NOT DETECTED NOT DETECTED Final   Parainfluenza Virus 4 DETECTED (A) NOT DETECTED Final   Respiratory Syncytial Virus NOT DETECTED NOT DETECTED Final   Bordetella pertussis NOT DETECTED NOT DETECTED Final   Bordetella Parapertussis NOT DETECTED NOT DETECTED Final   Chlamydophila pneumoniae NOT DETECTED NOT DETECTED Final   Mycoplasma pneumoniae NOT DETECTED NOT DETECTED Final    Comment: Performed at [facility], [place]. [address], [place], [place] [address]  Blood culture (routine x 2)     Status: None (Preliminary result)   Collection Time: 12/03/22 12:15 AM   Specimen: BLOOD RIGHT FOREARM  Result  Value Ref Range Status   Specimen Description BLOOD RIGHT FOREARM  Final   Special Requests   Final    BOTTLES DRAWN AEROBIC AND ANAEROBIC Blood Culture adequate volume   Culture   Final    NO GROWTH 1 DAY Performed at [facility], [address], [place], [place] [address]    Report Status PENDING  Incomplete  Respiratory (~20 pathogens) panel by PCR     Status: Abnormal   Collection Time: 12/03/22 11:00 AM   Specimen: Nasopharyngeal Swab; Respiratory  Result Value Ref Range Status   Adenovirus NOT DETECTED NOT DETECTED Final   Coronavirus 229E NOT DETECTED NOT DETECTED Final    Comment: (NOTE) The Coronavirus on the Respiratory Panel, DOES NOT test for the novel  Coronavirus (2019 nCoV)    Coronavirus HKU1 NOT DETECTED NOT DETECTED Final   Coronavirus NL63 NOT DETECTED NOT DETECTED Final   Coronavirus OC43 NOT DETECTED NOT DETECTED Final   Metapneumovirus NOT DETECTED NOT DETECTED Final   Rhinovirus / Enterovirus NOT DETECTED NOT DETECTED Final   Influenza A NOT DETECTED NOT DETECTED  Final   Influenza B NOT DETECTED NOT DETECTED Final   Parainfluenza Virus 1 NOT DETECTED NOT DETECTED Final   Parainfluenza Virus 2 NOT DETECTED NOT DETECTED Final   Parainfluenza Virus 3 NOT DETECTED NOT DETECTED Final   Parainfluenza Virus 4 DETECTED (A) NOT DETECTED Final   Respiratory Syncytial Virus NOT DETECTED NOT DETECTED Final   Bordetella pertussis NOT DETECTED NOT DETECTED Final   Bordetella Parapertussis NOT DETECTED NOT DETECTED Final   Chlamydophila pneumoniae NOT DETECTED NOT DETECTED Final   Mycoplasma pneumoniae NOT DETECTED NOT DETECTED Final    Comment: Performed at [facility], [place]. [address] [place], [place] [address]  Blood culture (routine x 2)     Status: None (Preliminary result)   Collection Time: 12/03/22  8:32 PM   Specimen: BLOOD LEFT ARM  Result Value Ref Range Status   Specimen Description BLOOD LEFT ARM  Final   Special Requests   Final    BOTTLES DRAWN AEROBIC ONLY Blood Culture adequate volume   Culture   Final    NO GROWTH < 12 HOURS Performed at [facility], [place] [address], [place], [place] [address]    Report Status PENDING  Incomplete         Radiology Studies: CT Angio Chest PE W/Cm &/Or Wo Cm  Result Date: 12/03/2022 CLINICAL DATA:  89 year old with history of COPD and lung cancer, presents with shortness of breath and cough with hemoptysis. Pulmonary embolism suspected. EXAM: CT ANGIOGRAPHY CHEST WITH CONTRAST TECHNIQUE: Multidetector CT imaging of the chest was performed using the standard protocol during bolus administration of intravenous contrast. Multiplanar CT image reconstructions and MIPs were obtained to evaluate the vascular anatomy. RADIATION DOSE REDUCTION: This exam was performed according to the departmental dose-optimization program which includes automated exposure control, adjustment of the mA and/or kV according to patient size and/or use of iterative reconstruction technique. CONTRAST:  17mL OMNIPAQUE IOHEXOL  350 MG/ML SOLN COMPARISON:  PA and lateral 12/02/2022, chest radiograph 06/12/2020, CTA chest 06/09/2020 FINDINGS: Cardiovascular: There is no appreciable arterial embolus or dilatation. Left upper lobe arteries are poorly opacified as before, with chronic left upper lobe collapse again suspected post treatment related and with a small chronic loculated left apical pleural effusion. There is mild cardiomegaly. There is patchy calcification in the circumflex and LAD coronary arteries and no pericardial effusion. There is moderate  mixed plaque in the aorta, proximal great vessels. The pulmonary veins are decompressed. No aortic aneurysm or dissection is seen and no significant aortic or great vessel stenosis. Mediastinum/Nodes: No intrathoracic or axillary adenopathy is seen. No filling defect in the trachea and main bronchi. There is no esophageal thickening. Thyroid gland is atrophic without mass. Lungs/Pleura: Remote right upper lobe hypertrophy. Chronic collapse again noted in the left upper lobe with chronic loculated left apical pleural fluid and with left-sided chronic volume loss, again suspected treatment related. There are emphysematous and scarring changes of the lungs. No new pleural effusion is seen. No pneumothorax. There are increased patchy alveolar infiltrates in the left mid to lower lung field most likely due to interval new pneumonia. Some of the consolidation demonstrates increased cystic changes in the parenchyma which could indicate a necrotizing pneumonic component. On the right, there is increased patchy ground-glass opacity in the basal segments of the lower lobe. This likely indicates additional areas of pneumonitis. Posteromedially in the right upper lobe, there is a paraspinal pleural-based 2.1 x 1.5 cm nodule which previously had a more ground-glass character and is concerning for neoplasm, although it is not significantly changed in overall size. PET-CT evaluation is recommended. There  are coarse scarring changes in the right apex. No other significant or new pulmonary opacities are seen. Upper Abdomen: Not well seen due to breathing motion and the patient's overlying left arm creating streak artifacts. No obvious acute changes. Musculoskeletal: There is osteopenia with mild degenerative change in the thoracic spine. No destructive bone lesion is seen. No chest wall mass. Review of the MIP images confirms the above findings. IMPRESSION: 1. No evidence of arterial embolus, with suboptimal depiction of left upper lobe arteries due to chronic left upper lobe collapse. 2. Chronic left upper lobe collapse with chronic loculated left apical pleural effusion. Old right upper lobectomy. 3. Interval development of patchy alveolar infiltrates in the left mid to lower lung field, with increased cystic changes in the parenchyma which could indicate a necrotizing pneumonic component. 4. Increased patchy ground-glass opacity in the basal segments of the right lower lobe, likely additional areas of pneumonitis. 5. 2.1 x 1.5 cm pleural-based nodule in the posteromedial right upper lobe, previously ground-glass in character and concerning for neoplasm. PET-CT recommended. 6. Aortic and coronary artery atherosclerosis.  Mild cardiomegaly. Electronically Signed   By: Telford Nab M.D.   On: 12/03/2022 00:59        Scheduled Meds:  atorvastatin  10 mg Oral Daily   heparin  5,000 Units Subcutaneous Q8H   levothyroxine  50 mcg Oral Q0600   Continuous Infusions:  azithromycin Stopped (12/03/22 2259)   cefTRIAXone (ROCEPHIN)  IV 2 g (12/04/22 8325)          Aline August, MD [facility] 12/04/2022, 11:12 AM

## 2022-12-04 NOTE — Progress Notes (Signed)
Pharmacy Antibiotic Note  Valerie Young is a 89 y.o. female admitted on 12/02/2022 with  CAP-necrotizing pna .  Pharmacy has been consulted for Unasyn dosing.   Plan: Unasyn 3gm IV q12 Will f/u renal function, micro data, and pt's clinical condition Noted plan per CCM to transition to Augmentin at d/c with total recommended abx course ~3 weeks.  Height: 5\' 4"  (162.6 cm) Weight: 45.4 kg (100 lb) IBW/kg (Calculated) : 54.7  Temp (24hrs), Avg:98.7 F (37.1 C), Min:97.9 F (36.6 C), Max:99.9 F (37.7 C)  Recent Labs  Lab 12/02/22 2306 12/02/22 2323 12/03/22 0704 12/04/22 0627  WBC 15.8*  --  15.8* 14.4*  CREATININE 1.46* 1.50* 1.43* 1.39*  LATICACIDVEN 1.7  --  1.7  --     Estimated Creatinine Clearance: 18.9 mL/min (A) (by C-G formula based on SCr of 1.39 mg/dL (H)).    Allergies  Allergen Reactions   Sulfamethoxazole-Trimethoprim Rash   Tramadol Hcl     Other reaction(s): mental status changes    Antimicrobials this admission: 1/9 Azith >> 1/10 1/9 Rocephin >> 1/10 1/10 Unasyn >>  Microbiology results: 1/9 BCx: ngtd  MRSA PCR:   Thank you for allowing pharmacy to be a part of this patient's care.  Sherlon Handing, PharmD, BCPS Please see amion for complete clinical pharmacist phone list 12/04/2022 5:38 PM

## 2022-12-04 NOTE — Consult Note (Addendum)
NAME:  Valerie Young, MRN:  [id number], DOB:  22-Mar-1929, LOS: 1 ADMISSION DATE:  12/02/2022 CONSULTATION DATE:  12/04/2022 REFERRING MD:  Starla Link - [facility] CHIEF COMPLAINT:  Necrotizing PNA   History of Present Illness:  89 year old woman who presented to [facility] 1/8 for increasing SOB, cough and hemoptysis. PMHx significant for HTN, HLD, CVA (05/2020),DVT (not on AC), COPD, recurrent NSCLC (s/p RUL lobectomy, LUL SBRT 2014), hypothyroidism, CKD stage III, DJD. Previously seen by [facility] 07/2020 (Dr. Lamonte Sakai). At that time, CT Chest demonstrated small increase in LLL subpleural nodule (1.4 x 1.0cm), increased medial RLL subpleural consolidation, increased subpleural LLL nodule (73mm) and LLL cavitary lesion.   Initially presented on 9/10 with worsening SOB and cough.  On presentation patient was negative for COVID-19, influenza, and RSV.  RVP +parainfluenza. Given concerns for acute pneumonia, CTA Chest was obtained 1/9 and negative for PE but showed interval development of patchy alveolar infiltrates in the left and mid lower lung fields with increased cystic changes in the parenchyma which could indicate necrotizing pneumonia along with a 2.1 x 1.5 cm pleural-based nodule.  Patient was admitted per [facility] for management of pneumonia.  PCCM consulted for further assistance in management.  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 COVID/Flu/RSV negative. 1/9 - RVP +parainfluenza. CTA Chest negative for PE; interval development of patchy alveolar infiltrates in the left and mid lower lung fields with increased cystic changes in the parenchyma which could indicate necrotizing pneumonia along with a 2.1 x 1.5 cm pleural-based nodule. 1/10 - PCCM consulted for pulmonary recommendations  Interim History / Subjective:  PCCM consulted for recommendations  Objective:  Blood pressure 111/83, pulse 70, temperature 98.4 F (36.9 C), temperature source Oral, resp. rate 16, height 5\' 4"  (1.626 m), weight 45.4 kg, SpO2 100 %.        Intake/Output Summary (Last 24 hours) at 12/04/2022 1138 Last data filed at 12/04/2022 0411 Gross per 24 hour  Intake 913.53 ml  Output --  Net 913.53 ml   Filed Weights   12/02/22 2210  Weight: 45.4 kg   Physical Examination: General: Chronically ill-appearing elderly woman in NAD. Frail, but pleasant and conversant. HEENT: [place]/AT, anicteric sclera, PERRL, moist mucous membranes. [place] in place. Neuro: Awake, oriented x 4. Responds to verbal stimuli. Following commands consistently. Moves all 4 extremities spontaneously. Generalized weakness/frailty.  CV: RRR, no m/g/r. PULM: Breathing even and unlabored on 3LNC (not on O2 at baseline). Lung fields diminished bilaterally, most in R upper field and L lower fields. GI: Soft, nontender, nondistended. Normoactive bowel sounds. Extremities: No LE edema noted. Skin: Warm/dry, no rashes.  Resolved Hospital Problem List:    Assessment & Plan:  Necrotizing pneumonia, possible CAP superimposed on viral PNA +Parainfluenza infection Presented 1/8  with increased SOB, hemoptysis. COVID/Flu/RSV negative. RVP +parainfluenza. C/f PNA on CXR; CT Chest negative for PE but demonstrated interval development of patchy alveolar infiltrates in the left and mid lower lung fields with increased cystic changes in the parenchyma  which could indicate necrotizing pneumonia along with a 2.1 x 1.5 cm pleural-based nodule. - Continue supplemental O2 support - Wean O2 for sat > 90% - Bronchodilators PRN - Pulmonary hygiene - Escalate antibiotics to Unasyn to cover necrotizing PNA; will transition to Augmentin at discharge for likely 3 week total course with plan for f/u in Pulmonary Clinic - Will request outpatient pulmonary follow up  Recurrent NSCLC S/p RUL lobectomy, L lobe SBRT Subpleural pulmonary nodules Previously followed by [facility], last seen 07/2020 by Dr. Lamonte Sakai. At that time, CT findings present; patient opted for conservative management/surveillance; did not want to pursue biopsy. - Historically has not wanted aggressive management (last discussion 07/2020) - Per patient, ok to discuss care with son Abe People) and daughter (April) - Repeat CT as outpatient - Unsure that PET-CT would provide valuable information at this juncture, given active infection treatment; would show increased uptake - PMT involvement appropriate, regardless of whether or not patient wishes to pursue aggressive workup/management  Best Practice: (right click and "Reselect all SmartList Selections" daily)   Per Primary Team  Labs:  CBC: Recent Labs  Lab 12/02/22 2306 12/02/22 2323 12/02/22 2324 12/03/22 0704 12/04/22 0627  WBC 15.8*  --   --  15.8* 14.4*  NEUTROABS  --   --   --  12.2* 9.3*  HGB 12.2 13.6 12.9 10.8* 11.6*  HCT 38.5 40.0 38.0 33.8* 38.1  MCV 98.7  --   --  98.5 100.3*  PLT 272  --   --  224 109   Basic Metabolic Panel: Recent Labs  Lab 12/02/22 2306 12/02/22 2323 12/02/22 2324 12/03/22 0704 12/04/22 0627  NA 137 140 138 137 137  K 4.4 5.1 5.7* 4.4 3.4*  CL 102 103  --  104 103  CO2 27  --   --  25 22  GLUCOSE 133* 133*  --  121* 98  BUN 24* 27*  --  21 19  CREATININE 1.46* 1.50*  --  1.43* 1.39*  CALCIUM 9.1  --   --  8.8* 9.1  MG  --   --   --  2.0 2.0  PHOS  --   --   --  4.0  --     GFR: Estimated Creatinine Clearance: 18.9 mL/min (A) (by C-G formula based on SCr of 1.39 mg/dL (H)). Recent Labs  Lab 12/02/22 2306 12/03/22 0704 12/04/22 0627  PROCALCITON  --  0.11  --   WBC 15.8* 15.8* 14.4*  LATICACIDVEN 1.7 1.7  --    Liver Function Tests: Recent Labs  Lab 12/02/22 2306 12/03/22 0704  AST 22 23  ALT 13 11  ALKPHOS 87 70  BILITOT 0.6 0.4  PROT 7.3 6.1*  ALBUMIN 3.5 2.9*   No results for input(s): "LIPASE", "AMYLASE" in the last 168 hours. No results for input(s): "AMMONIA" in the last 168 hours.  ABG:    Component Value Date/Time   PHART 7.447 11/22/2013 0154   PCO2ART 29.5 (L) 11/22/2013 0154   PO2ART 76.2 (L) 11/22/2013 0154   HCO3 29.5 (H) 12/02/2022 2324   TCO2 31 12/02/2022 2324   ACIDBASEDEF 2.7 (H) 11/22/2013 0154   O2SAT 54 12/02/2022 2324    Coagulation Profile: Recent Labs  Lab 12/03/22 0704  INR 1.1   Cardiac Enzymes: No results for input(s): "CKTOTAL", "CKMB", "CKMBINDEX", "TROPONINI" in the last 168 hours.  HbA1C: Hgb A1c MFr Bld  Date/Time Value Ref Range Status  06/08/2020 02:51 AM 5.9 (H) 4.8 - 5.6 % Final    Comment:    (NOTE) Pre diabetes:          5.7%-6.4%  Diabetes:              >6.4%  Glycemic control for   <7.0% adults with diabetes    CBG: No results for input(s): "GLUCAP" in the last 168 hours.  Review of Systems:   Review of systems completed with pertinent positives/negatives outlined in above HPI.  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   Surgical History:   Past Surgical History:  Procedure Laterality Date   ABDOMINAL HYSTERECTOMY     PARTIAL   CATARACT EXTRACTION, BILATERAL     JOINT REPLACEMENT      RIGHT TOTAL KNEE   LOOP RECORDER INSERTION N/A 06/12/2020   Procedure: LOOP RECORDER INSERTION;  Surgeon: Deboraha Sprang, MD;  Location: [facility];  Service: Cardiovascular;  Laterality: N/A;   RADIATION OF LEFT LUNG     right lung cancer treated with resection  1982   right thigh postoperative hematoma  02-2008   RIGHT UPPER LOBE REMOVED     THYROID SURGERY     THYROIDECTOMY     TOTAL KNEE ARTHROPLASTY     Right   VESICOVAGINAL FISTULA CLOSURE W/ TAH     VIDEO BRONCHOSCOPY  12/08/2012   Procedure: VIDEO BRONCHOSCOPY WITH FLUORO;  Surgeon: Kathee Delton, MD;  Location: [facility];  Service: Cardiopulmonary;  Laterality: Bilateral;    Social History:   reports that she quit smoking about 42 years ago. Her smoking use included cigarettes. She has a 35.00 pack-year smoking history. She has never used smokeless tobacco. She reports that she does not drink alcohol and does not use drugs.   Family History:  Her family history includes CVA (age of onset: 94) in her mother.   Allergies: Allergies  Allergen Reactions   Sulfamethoxazole-Trimethoprim Rash   Tramadol Hcl     Other reaction(s): mental status changes    Home Medications: Prior to Admission medications   Medication Sig Start Date End Date Taking? Authorizing Provider  acetaminophen (TYLENOL) 325 MG tablet Take 325 mg by mouth daily.   Yes [provider]  albuterol (PROVENTIL HFA;VENTOLIN HFA) 108 (90 Base) MCG/ACT inhaler Inhale 2 puffs into the lungs every 6 (six) hours as needed for wheezing or shortness of breath. 10/15/18  Yes Collene Gobble, MD  amLODipine (NORVASC) 5 MG tablet TAKE 1 TABLET (5 MG TOTAL) BY MOUTH AT BEDTIME. Patient taking differently: Take 5 mg by mouth daily.   Yes Jegede, Olugbemiga E, MD  atenolol (TENORMIN) 50 MG tablet Take 1 tablet (50 mg total) by mouth daily. 01/06/14  Yes Tresa Garter, MD  atorvastatin (LIPITOR) 10 MG tablet Take 10 mg by mouth daily. 09/09/22  Yes  [provider]  cetirizine (ZYRTEC) 10 MG tablet TAKE 1 TABLET (10 MG TOTAL) BY MOUTH AT BEDTIME. Patient taking differently: Take 10 mg by mouth daily. 11/06/15  Yes Collene Gobble, MD  levothyroxine (  SYNTHROID, LEVOTHROID) 50 MCG tablet Take 50 mcg by mouth daily before breakfast.  09/12/15  Yes [provider]  polyvinyl alcohol (LIQUIFILM TEARS) 1.4 % ophthalmic solution Place 1 drop into both eyes as needed for dry eyes.   Yes [provider]    Signature:   Rhae Lerner [facility] 12/04/22 11:38 AM  Please see [email or web address] for pager details.  From 7A-7P if no response, please call [phone] After hours, please call ELink [phone]

## 2022-12-04 NOTE — Consult Note (Shared)
NAME:  Valerie Young, MRN:  [id number], DOB:  02-May-1929, LOS: 1 ADMISSION DATE:  12/02/2022, CONSULTATION DATE:  12/04/2022 REFERRING MD:  Dr. Starla Link - [facility], CHIEF COMPLAINT: Necrotizing pneumonia  History of Present Illness:  Valerie Young is a 89 year old female with a past medical history significant for lung cancer status post right upper CKD stage III, hypothyroidism, COPD, hyperlipidemia who initially presented on 9/10 with worsening shortness of breath and cough.  On presentation patient was negative for COVID-19, influenza, and RSV.  Given concerns for acute pneumonia CTA chest was obtained and revealed no PE but showed interval development of patchy alveolar infiltrates in the left and mid lower lung fields with increased cystic changes in the parenchyma which could indicate necrotizing pneumonia along with a 2.1 x 1.5 cm pleural-based nodule.  Patient was admitted per [facility] for management of pneumonia.  PCCM consulted for further assistance in management  Pertinent  Medical History  lung cancer status post right upper CKD stage III, hypothyroidism, COPD, hyperlipidemia   Significant Hospital Events: Including procedures, antibiotic start and stop dates in addition to other pertinent events   1/9 presented with complaints of shortness of breath and cough CTA chest was obtained and revealed no PE but showed interval development of patchy alveolar infiltrates in the left and mid lower lung fields with increased cystic changes in the parenchyma which could indicate necrotizing pneumonia along with a 2.1 x 1.5 cm pleural-based nodule.  1/10 PCCM consulted  Interim History / Subjective:  As above  Objective   Blood pressure (!) 131/55, pulse 75, temperature 98.4 F (36.9 C), temperature source Oral, resp. rate 16, height 5\' 4"  (1.626 m), weight 45.4 kg, SpO2 100 %.        Intake/Output Summary (Last 24 hours) at 12/04/2022 1130 Last data filed at 12/04/2022 0411 Gross per 24 hour  Intake  913.53 ml  Output --  Net 913.53 ml   Filed Weights   12/02/22 2210  Weight: 45.4 kg    Examination: General: Chronically ill appearing elderly *** female/female on mechanical ventilation, in NAD HEENT: ETT, MM pink/moist, PERRL,  Neuro: *** CV: s1s2 regular rate and rhythm, no murmur, rubs, or gallops,  PULM:  *** GI: soft, bowel sounds active in all 4 quadrants, non-tender, non-distended, tolerating TF*** Extremities: warm/dry, *** edema  Skin: no rashes or lesions  Resolved Hospital Problem list     Assessment & Plan:  Concern for possible necrotizing pneumonia -CTA chest with new development of patchy alveolar infiltrates in the left mid to lower lung field with increased cystic changes consistent with possible necrotizing pneumonia History of lung cancer now status post right upper lobectomy Chronic left upper lobe collapse Lung nodule -CTA 1/9 with 2.1 x 1.5 pleural-based nodule in the posterior right upper lobe concerning for neoplasm per CT Acute hypoxic respiratory failure -Currently requiring 3 L nasal cannula P: Continue ceftriaxone and azithromycin Encourage pulmonary hygiene Continue supplemental oxygen, wean as able SpO2 goal greater than 92 Mobilize as able Aspiration precaution Will discuss with attending benefit of bronchoscopy for tissue sampling  Best Practice (right click and "Reselect all SmartList Selections" daily)  Per primary  Labs   CBC: Recent Labs  Lab 12/02/22 2306 12/02/22 2323 12/02/22 2324 12/03/22 0704 12/04/22 0627  WBC 15.8*  --   --  15.8* 14.4*  NEUTROABS  --   --   --  12.2* 9.3*  HGB 12.2 13.6 12.9 10.8* 11.6*  HCT 38.5 40.0 38.0 33.8* 38.1  MCV 98.7  --   --  98.5 100.3*  PLT 272  --   --  224 361    Basic Metabolic Panel: Recent Labs  Lab 12/02/22 2306 12/02/22 2323 12/02/22 2324 12/03/22 0704 12/04/22 0627  NA 137 140 138 137 137  K 4.4 5.1 5.7* 4.4 3.4*  CL 102 103  --  104 103  CO2 27  --   --  25 22   GLUCOSE 133* 133*  --  121* 98  BUN 24* 27*  --  21 19  CREATININE 1.46* 1.50*  --  1.43* 1.39*  CALCIUM 9.1  --   --  8.8* 9.1  MG  --   --   --  2.0 2.0  PHOS  --   --   --  4.0  --    GFR: Estimated Creatinine Clearance: 18.9 mL/min (A) (by C-G formula based on SCr of 1.39 mg/dL (H)). Recent Labs  Lab 12/02/22 2306 12/03/22 0704 12/04/22 0627  PROCALCITON  --  0.11  --   WBC 15.8* 15.8* 14.4*  LATICACIDVEN 1.7 1.7  --     Liver Function Tests: Recent Labs  Lab 12/02/22 2306 12/03/22 0704  AST 22 23  ALT 13 11  ALKPHOS 87 70  BILITOT 0.6 0.4  PROT 7.3 6.1*  ALBUMIN 3.5 2.9*   No results for input(s): "LIPASE", "AMYLASE" in the last 168 hours. No results for input(s): "AMMONIA" in the last 168 hours.  ABG    Component Value Date/Time   PHART 7.447 11/22/2013 0154   PCO2ART 29.5 (L) 11/22/2013 0154   PO2ART 76.2 (L) 11/22/2013 0154   HCO3 29.5 (H) 12/02/2022 2324   TCO2 31 12/02/2022 2324   ACIDBASEDEF 2.7 (H) 11/22/2013 0154   O2SAT 54 12/02/2022 2324     Coagulation Profile: Recent Labs  Lab 12/03/22 0704  INR 1.1    Cardiac Enzymes: No results for input(s): "CKTOTAL", "CKMB", "CKMBINDEX", "TROPONINI" in the last 168 hours.  HbA1C: Hgb A1c MFr Bld  Date/Time Value Ref Range Status  06/08/2020 02:51 AM 5.9 (H) 4.8 - 5.6 % Final    Comment:    (NOTE) Pre diabetes:          5.7%-6.4%  Diabetes:              >6.4%  Glycemic control for   <7.0% adults with diabetes     CBG: No results for input(s): "GLUCAP" in the last 168 hours.  Review of Systems:   Please see the history of present illness. 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   Surgical History:   Past Surgical History:  Procedure Laterality Date   ABDOMINAL HYSTERECTOMY     PARTIAL   CATARACT EXTRACTION, BILATERAL     JOINT REPLACEMENT     RIGHT TOTAL KNEE   LOOP RECORDER INSERTION N/A 06/12/2020   Procedure: LOOP RECORDER INSERTION;  Surgeon: Deboraha Sprang, MD;  Location: [facility];  Service: Cardiovascular;  Laterality: N/A;   RADIATION OF LEFT LUNG     right lung cancer treated with resection  1982   right thigh postoperative hematoma  02-2008   RIGHT UPPER LOBE REMOVED  THYROID SURGERY     THYROIDECTOMY     TOTAL KNEE ARTHROPLASTY     Right   VESICOVAGINAL FISTULA CLOSURE W/ TAH     VIDEO BRONCHOSCOPY  12/08/2012   Procedure: VIDEO BRONCHOSCOPY WITH FLUORO;  Surgeon: Kathee Delton, MD;  Location: [facility];  Service: Cardiopulmonary;  Laterality: Bilateral;     Social History:   reports that she quit smoking about 42 years ago. Her smoking use included cigarettes. She has a 35.00 pack-year smoking history. She has never used smokeless tobacco. She reports that she does not drink alcohol and does not use drugs.   Family History:  Her family history includes CVA (age of onset: 54) in her mother.   Allergies Allergies  Allergen Reactions   Sulfamethoxazole-Trimethoprim Rash   Tramadol Hcl     Other reaction(s): mental status changes     Home Medications  Prior to Admission medications   Medication Sig Start Date End Date Taking? Authorizing Provider  acetaminophen (TYLENOL) 325 MG tablet Take 325 mg by mouth daily.   Yes [provider]  albuterol (PROVENTIL HFA;VENTOLIN HFA) 108 (90 Base) MCG/ACT inhaler Inhale 2 puffs into the lungs every 6 (six) hours as needed for wheezing or shortness of breath. 10/15/18  Yes Collene Gobble, MD  amLODipine (NORVASC) 5 MG tablet TAKE 1 TABLET (5 MG TOTAL) BY MOUTH AT BEDTIME. Patient taking differently: Take 5 mg by mouth daily.   Yes  Jegede, Olugbemiga E, MD  atenolol (TENORMIN) 50 MG tablet Take 1 tablet (50 mg total) by mouth daily. 01/06/14  Yes Tresa Garter, MD  atorvastatin (LIPITOR) 10 MG tablet Take 10 mg by mouth daily. 09/09/22  Yes [provider]  cetirizine (ZYRTEC) 10 MG tablet TAKE 1 TABLET (10 MG TOTAL) BY MOUTH AT BEDTIME. Patient taking differently: Take 10 mg by mouth daily. 11/06/15  Yes Collene Gobble, MD  levothyroxine (SYNTHROID, LEVOTHROID) 50 MCG tablet Take 50 mcg by mouth daily before breakfast.  09/12/15  Yes [provider]  polyvinyl alcohol (LIQUIFILM TEARS) 1.4 % ophthalmic solution Place 1 drop into both eyes as needed for dry eyes.   Yes [provider]     Signature  Lilya Smitherman D. Harris, NP-C [facility] Personal contact information can be found on Amion  If no contact or response made please call [phone] 12/04/2022, 11:41 AM

## 2022-12-04 NOTE — Progress Notes (Signed)
Breakfast tray ordered. Pt placed on automatic trays

## 2022-12-04 NOTE — Evaluation (Signed)
Physical Therapy Evaluation Patient Details Name: Valerie Young MRN: [id number] DOB: Nov 27, 1928 Today's Date: 12/04/2022  History of Present Illness  Pt is 89 yo female admitted with acute resp failure, para influenza PNE, and possible necrotizing PNE.  Pt with hx including HTN, CKD, hypothyroidism, COPD, HLD, CVA, lung CA s/p R upper lobectomy  Clinical Impression  Pt admitted with above diagnosis. At baseline, pt lives alone and independent with adls, light adls, and ambulation either with RW or "furniture surfing."  Reports last fall ~4 months ago and was getting HHPT.  Today, pt was able to ambulate 75' with RW and min guard, min A for transfers, on 3 L O2 with VSS.  She is below baseline but motivated and expected to progress well.  May need increased support initially at home. Pt currently with functional limitations due to the deficits listed below (see PT Problem List). Pt will benefit from skilled PT to increase their independence and safety with mobility to allow discharge to the venue listed below.          Recommendations for follow up therapy are one component of a multi-disciplinary discharge planning process, led by the attending physician.  Recommendations may be updated based on patient status, additional functional criteria and insurance authorization.  Follow Up Recommendations Home health PT      Assistance Recommended at Discharge Intermittent Supervision/Assistance  Patient can return home with the following  A little help with walking and/or transfers;A little help with bathing/dressing/bathroom;Assistance with cooking/housework;Help with stairs or ramp for entrance    Equipment Recommendations None recommended by PT  Recommendations for Other Services       Functional Status Assessment Patient has had a recent decline in their functional status and demonstrates the ability to make significant improvements in function in a reasonable and predictable amount of time.      Precautions / Restrictions Precautions Precautions: Fall      Mobility  Bed Mobility Overal bed mobility: Needs Assistance Bed Mobility: Supine to Sit, Sit to Supine     Supine to sit: Supervision Sit to supine: Supervision   General bed mobility comments: increased time    Transfers Overall transfer level: Needs assistance Equipment used: Rolling walker (2 wheels), 1 person hand held assist Transfers: Sit to/from Stand Sit to Stand: Min assist           General transfer comment: light min A and cues for hand placement    Ambulation/Gait Ambulation/Gait assistance: Mod assist, Min guard Gait Distance (Feet): 50 Feet Assistive device: Rolling walker (2 wheels), None Gait Pattern/deviations: Step-to pattern, Decreased stride length Gait velocity: decreased     General Gait Details: Pt in a hurry to get to restroom at arrival and no RW in room.  Performed with HHA and pt with wide BOS, unsteady, and requiring mod A.  While pt in restroom found RW.  Pt able to progress to 37' with RW and min guard  Stairs            Wheelchair Mobility    Modified Rankin (Stroke Patients Only)       Balance Overall balance assessment: Needs assistance Sitting-balance support: No upper extremity supported Sitting balance-Leahy Scale: Good     Standing balance support: Bilateral upper extremity supported, Single extremity supported, Reliant on assistive device for balance Standing balance-Leahy Scale: Poor Standing balance comment: Reqiring UE support  Pertinent Vitals/Pain Pain Assessment Pain Assessment: No/denies pain    Home Living Family/patient expects to be discharged to:: Private residence Living Arrangements: Alone Available Help at Discharge: Family;Personal care attendant;Available PRN/intermittently (PCA 1 day a week 3 hr; family visits) Type of Home: Apartment Home Access: Level entry       Home Layout:  One level Home Equipment: Conservation officer, nature (2 wheels);Cane - single point;Wheelchair Probation officer (4 wheels);Shower seat Additional Comments: reports shower chair is old    Prior Function               Mobility Comments: Ambulates in apartment and short community distances.  Daughter reports she is supposed to use RW but often just furniture surfs ADLs Comments: Independent with ADLs; does sponge baths mostly (reports shower chair old and needs tub bench); Able to fix easy meals and does light cleaning ; has assist with heavier IADLs     Hand Dominance   Dominant Hand: Right    Extremity/Trunk Assessment   Upper Extremity Assessment Upper Extremity Assessment: Generalized weakness    Lower Extremity Assessment Lower Extremity Assessment: Generalized weakness    Cervical / Trunk Assessment Cervical / Trunk Assessment: Normal  Communication   Communication: HOH  Cognition Arousal/Alertness: Awake/alert Behavior During Therapy: WFL for tasks assessed/performed Overall Cognitive Status: Within Functional Limits for tasks assessed                                          General Comments General comments (skin integrity, edema, etc.): On 3 L O2 with VSS    Exercises     Assessment/Plan    PT Assessment Patient needs continued PT services  PT Problem List Decreased strength;Cardiopulmonary status limiting activity;Decreased range of motion;Decreased activity tolerance;Decreased knowledge of use of DME;Decreased balance;Decreased safety awareness;Decreased mobility;Decreased knowledge of precautions       PT Treatment Interventions DME instruction;Therapeutic exercise;Gait training;Balance training;Functional mobility training;Therapeutic activities;Patient/family education;Neuromuscular re-education    PT Goals (Current goals can be found in the Care Plan section)  Acute Rehab PT Goals Patient Stated Goal: return home PT Goal Formulation: With  patient/family Time For Goal Achievement: 12/18/22 Potential to Achieve Goals: Good    Frequency Min 3X/week     Co-evaluation               AM-PAC PT "6 Clicks" Mobility  Outcome Measure Help needed turning from your back to your side while in a flat bed without using bedrails?: None Help needed moving from lying on your back to sitting on the side of a flat bed without using bedrails?: A Little Help needed moving to and from a bed to a chair (including a wheelchair)?: A Little Help needed standing up from a chair using your arms (e.g., wheelchair or bedside chair)?: A Little Help needed to walk in hospital room?: A Little Help needed climbing 3-5 steps with a railing? : A Little 6 Click Score: 19    End of Session Equipment Utilized During Treatment: Gait belt Activity Tolerance: Patient tolerated treatment well Patient left: in bed;with call bell/phone within reach;with bed alarm set;with family/visitor present (family requesting purewick in place for incontinence (was in place at arrival); pt encouraged by NT and PT to call for assist to go to bathroom to maintain mobility, but did place purewick for incontinence) Nurse Communication: Mobility status;Other (comment) (coughing up blood) PT Visit Diagnosis: Unsteadiness on feet (  R26.81);Muscle weakness (generalized) (M62.81)    Time: 3300-7622 PT Time Calculation (min) (ACUTE ONLY): 37 min   Charges:   PT Evaluation $PT Eval Moderate Complexity: 1 Mod PT Treatments $Gait Training: 8-22 mins        Abran Richard, PT Acute Rehab [facility] [phone]   Karlton Lemon 12/04/2022, 4:59 PM

## 2022-12-04 NOTE — Progress Notes (Signed)
   12/04/22 1134  Vitals  Temp 98.4 F (36.9 C)  Temp Source Oral  BP 111/83  MAP (mmHg) 93  BP Location Left Arm  BP Method Automatic  Patient Position (if appropriate) Lying  Pulse Rate 70  Pulse Rate Source Monitor  Resp 16  Level of Consciousness  Level of Consciousness Alert  MEWS COLOR  MEWS Score Color Green  Oxygen Therapy  SpO2 100 %  O2 Device Nasal Cannula  O2 Flow Rate (L/min) 3 L/min  Pain Assessment  Pain Scale 0-10  Pain Score 0  MEWS Score  MEWS Temp 0  MEWS Systolic 0  MEWS Pulse 0  MEWS RR 0  MEWS LOC 0  MEWS Score 0

## 2022-12-04 NOTE — Progress Notes (Signed)
Cardiac monitoring discontinued. Leads removed off patient

## 2022-12-05 DIAGNOSIS — J189 Pneumonia, unspecified organism: Secondary | ICD-10-CM | POA: Diagnosis not present

## 2022-12-05 LAB — CBC
HCT: 33.9 % — ABNORMAL LOW (ref 36.0–46.0)
Hemoglobin: 10.3 g/dL — ABNORMAL LOW (ref 12.0–15.0)
MCH: 30.7 pg (ref 26.0–34.0)
MCHC: 30.4 g/dL (ref 30.0–36.0)
MCV: 100.9 fL — ABNORMAL HIGH (ref 80.0–100.0)
Platelets: 237 10*3/uL (ref 150–400)
RBC: 3.36 MIL/uL — ABNORMAL LOW (ref 3.87–5.11)
RDW: 13.5 % (ref 11.5–15.5)
WBC: 8.5 10*3/uL (ref 4.0–10.5)
nRBC: 0 % (ref 0.0–0.2)

## 2022-12-05 LAB — MAGNESIUM: Magnesium: 2.1 mg/dL (ref 1.7–2.4)

## 2022-12-05 LAB — BASIC METABOLIC PANEL
Anion gap: 11 (ref 5–15)
BUN: 24 mg/dL — ABNORMAL HIGH (ref 8–23)
CO2: 25 mmol/L (ref 22–32)
Calcium: 8.9 mg/dL (ref 8.9–10.3)
Chloride: 102 mmol/L (ref 98–111)
Creatinine, Ser: 1.35 mg/dL — ABNORMAL HIGH (ref 0.44–1.00)
GFR, Estimated: 37 mL/min — ABNORMAL LOW (ref >60)
Glucose, Bld: 85 mg/dL (ref 70–99)
Potassium: 4.2 mmol/L (ref 3.5–5.1)
Sodium: 138 mmol/L (ref 135–145)

## 2022-12-05 NOTE — Progress Notes (Signed)
PT Note:  SATURATION QUALIFICATIONS: (This note is used to comply with regulatory documentation for home oxygen)  Patient Saturations on Room Air at Rest = 92%  Patient Saturations on Room Air while Ambulating = 87%  Patient Saturations on 2 Liters of oxygen while Ambulating = 95%  Please briefly explain why patient needs home oxygen:Pt with O2 desaturation on RA while ambulating.   Leighton Roach, PT  Acute Rehab Services Secure chat preferred Office [phone]

## 2022-12-05 NOTE — TOC Initial Note (Addendum)
Transition of Care (TOC) - Initial/Assessment Note   Spoke to patient at bedside and daughter April via speaker phone   PT recommendations for HHPT.   Patient from home alone. Patient has [facility] for HHPT already. NCM messaged Calvin with  [employer] he will check to see what services patient is active with and call NCM back . NCM at that time will enter orders for MD signature.   PAtient has bedside commode, Rollator and wheelchair at home already.   Patient currently on oxygen and does not have home oxygen. If needed at discharge NCM will order oxygen. Portable tank will come to hospital room prior to patient discharging . Patient and April voiced understanding   Patient active with [facility] for [place]. Entered orders  Patient Details  Name: Valerie Young MRN: [id number] Date of Birth: Jan 23, 1929  Transition of Care [facility]) CM/SW Contact:    Marilu Favre, RN Phone Number: 12/05/2022, 1:16 PM  Clinical Narrative:                   Expected Discharge Plan: [place] Barriers to Discharge: Continued Medical Work up   Patient Goals and CMS Choice Patient states their goals for this hospitalization and ongoing recovery are:: to return to home CMS Medicare.gov Compare Post Acute Care list provided to:: Patient Choice offered to / list presented to : Patient      Expected Discharge Plan and Services   Discharge Planning Services: CM Consult Post Acute Care Choice: [place] arrangements for the past 2 months: Apartment                 DME Arranged: N/A         HH Arranged: PT HH Agency: [facility] Date [place]: 12/05/22 Time HH Agency Contacted: [id number] Representative spoke with at [place]: [place] Arrangements/Services Living arrangements for the past 2 months: [place] with:: Self Patient language and need for interpreter reviewed:: Yes Do you feel safe going back to the place where you live?: Yes       Need for Family Participation in Patient Care: Yes (Comment) Care giver support system in place?: Yes (comment) Current home services: DME Criminal Activity/Legal Involvement Pertinent to Current Situation/Hospitalization: No - Comment as needed  Activities of Daily Living      Permission Sought/Granted   Permission granted to share information with : Yes, Verbal Permission Granted  Share Information with NAME: daughter April  Permission granted to share info w AGENCY: [facility]        Emotional Assessment Appearance:: Appears stated age Attitude/Demeanor/Rapport: Engaged Affect (typically observed): Accepting Orientation: : Oriented to Self, Oriented to Place, Oriented to  Time, Oriented to Situation Alcohol / Substance Use: Not Applicable Psych Involvement: No (comment)  Admission diagnosis:  CAP (community acquired pneumonia) [J18.9] Pneumonia due to infectious organism, unspecified laterality, unspecified part of lung [J18.9] Patient Active Problem List   Diagnosis Date Noted   Cerebral embolism with cerebral infarction 06/08/2020   Acute gastroenteritis 06/07/2020   Gastroenteritis 06/07/2020   Allergic rhinitis 10/21/2017   Pneumothorax on left 04/09/2017   Pleural effusion 04/09/2017   Pneumothorax 04/09/2017   Occlusion of left pulmonary artery ([place]) 04/09/2017   Neoplasm    Essential hypertension    Adenocarcinoma, lung ([place])    Lung cancer ([place]) 10/02/2015   Hypothyroidism 10/02/2015   Arthritis of knee 01/06/2014   Essential hypertension, benign 01/06/2014   Encounter for therapeutic  drug monitoring 12/17/2013   Pulmonary emboli ([place]) 11/23/2013   DVT (deep venous thrombosis) ([place]) 11/23/2013   COPD (chronic obstructive pulmonary disease) with emphysema ([place]) 11/22/2013   Acute respiratory failure with hypoxia ([place]) 11/22/2013   Leukocytosis, unspecified 11/22/2013   Anemia of chronic disease 11/22/2013   CAP (community acquired pneumonia) 11/22/2013    Chronic kidney disease (CKD), stage IV (severe) ([place]) 11/15/2013   Adenocarcinoma of lung ([place]) 12/11/2012   Pulmonary nodule 12/03/2012   HYPOTHYROIDISM 06/19/2007   HYPERTENSION 06/19/2007   PCP:  Lois Huxley, PA Pharmacy:   [id number] [address] [place] [place] [address] Phone: [phone] Fax: [phone]     Social Determinants of Health ([place]) Social History: SDOH Screenings   Tobacco Use: Medium Risk (12/03/2022)   SDOH Interventions:     Readmission Risk Interventions     No data to display

## 2022-12-05 NOTE — Progress Notes (Signed)
Physical Therapy Treatment Patient Details Name: Valerie Young MRN: [id number] DOB: 18-Oct-1929 Today's Date: 12/05/2022   History of Present Illness Pt is 89 yo female admitted with acute resp failure, para influenza PNE, and possible necrotizing PNE.  Pt with hx including HTN, CKD, hypothyroidism, COPD, HLD, CVA, lung CA s/p R upper lobectomy    PT Comments    Pt received in bed. SPO2 96% on 3L O2. O2 turned down to 2L and O2 maintained low 90's. Pt ambulated 40' with RW and min guard A on RA with sats dropping to 87%. Returned to 95% on 2L O2. Pt continues to cough up blood but reports feeling better today. Encouraged pt in consistent use of RW and she is unsteady with obstacles and turning even with it. PT will continue to follow.    Recommendations for follow up therapy are one component of a multi-disciplinary discharge planning process, led by the attending physician.  Recommendations may be updated based on patient status, additional functional criteria and insurance authorization.  Follow Up Recommendations  Home health PT     Assistance Recommended at Discharge Intermittent Supervision/Assistance  Patient can return home with the following A little help with walking and/or transfers;A little help with bathing/dressing/bathroom;Assistance with cooking/housework;Help with stairs or ramp for entrance   Equipment Recommendations  None recommended by PT    Recommendations for Other Services       Precautions / Restrictions Precautions Precautions: Fall Restrictions Weight Bearing Restrictions: No     Mobility  Bed Mobility Overal bed mobility: Needs Assistance Bed Mobility: Supine to Sit     Supine to sit: Supervision     General bed mobility comments: increased time    Transfers Overall transfer level: Needs assistance Equipment used: Rolling walker (2 wheels) Transfers: Sit to/from Stand Sit to Stand: Min guard           General transfer comment: vc's  for hand placement. No physical assist needed    Ambulation/Gait Ambulation/Gait assistance: Min assist Gait Distance (Feet): 40 Feet Assistive device: Rolling walker (2 wheels) Gait Pattern/deviations: Decreased stride length, Step-through pattern Gait velocity: decreased Gait velocity interpretation: <1.31 ft/sec, indicative of household ambulator   General Gait Details: min guard A with RW. Decreased stability with turns. Discussed advantages of using RW consistently. SPO2 dropped to 87% on RA. Returned to upper 90's on 2L O2.   Stairs             Wheelchair Mobility    Modified Rankin (Stroke Patients Only)       Balance Overall balance assessment: Needs assistance Sitting-balance support: No upper extremity supported Sitting balance-Leahy Scale: Good     Standing balance support: Bilateral upper extremity supported, Single extremity supported, Reliant on assistive device for balance Standing balance-Leahy Scale: Poor Standing balance comment: Reqiring UE support                            Cognition Arousal/Alertness: Awake/alert Behavior During Therapy: WFL for tasks assessed/performed Overall Cognitive Status: Within Functional Limits for tasks assessed                                          Exercises      General Comments        Pertinent Vitals/Pain Pain Assessment Pain Assessment: No/denies pain    Home Living  Prior Function            PT Goals (current goals can now be found in the care plan section) Acute Rehab PT Goals Patient Stated Goal: return home PT Goal Formulation: With patient/family Time For Goal Achievement: 12/18/22 Potential to Achieve Goals: Good Progress towards PT goals: Progressing toward goals    Frequency    Min 3X/week      PT Plan Current plan remains appropriate    Co-evaluation              AM-PAC PT "6 Clicks" Mobility    Outcome Measure  Help needed turning from your back to your side while in a flat bed without using bedrails?: None Help needed moving from lying on your back to sitting on the side of a flat bed without using bedrails?: A Little Help needed moving to and from a bed to a chair (including a wheelchair)?: A Little Help needed standing up from a chair using your arms (e.g., wheelchair or bedside chair)?: A Little Help needed to walk in hospital room?: A Little Help needed climbing 3-5 steps with a railing? : A Little 6 Click Score: 19    End of Session Equipment Utilized During Treatment: Gait belt Activity Tolerance: Patient tolerated treatment well Patient left: with call bell/phone within reach;in chair Nurse Communication: Mobility status PT Visit Diagnosis: Unsteadiness on feet (R26.81);Muscle weakness (generalized) (M62.81)     Time: 4496-7591 PT Time Calculation (min) (ACUTE ONLY): 20 min  Charges:  $Gait Training: 8-22 mins                     Leighton Roach, PT  Acute Rehab Services Secure chat preferred Office [place] 12/05/2022, 3:21 PM

## 2022-12-05 NOTE — Progress Notes (Addendum)
PROGRESS NOTE    Valerie Young  FTD:[id number] DOB: 1929-03-23 DOA: 12/02/2022 PCP: Lois Huxley, PA   Brief Narrative:  89 y.o. female with medical history significant of  Hypertension, CKDIII,hypothyroidism, COPD, Hyperlipidemia, CVA, lung ca s/p right upper lobectomy presented with worsening cough and shortness of breath.  On presentation, WBC was 15.8, COVID-19/influenza/RSV PCR were negative.  CTA chest showed no evidence of PE but showed interval development of patchy alveolar infiltrates in the left mid to lower lung field, with increased cystic changes in the parenchyma which could indicate upper necrotizing pneumonic component along with 2.1 x 1.5 cm pleural-based nodule in the posterior medial right upper lobe, previously groundglass and corrector and concerning for neoplasm.  She was started on IV antibiotics.  PCCM was consulted  Assessment & Plan:   Possible necrotizing pneumonia Acute respiratory failure with hypoxia Parainfluenza pneumonia -As evidenced on CTA chest.  -COVID-19/influenza/RSV PCR were negative.  Respiratory panel PCR was positive for parainfluenza -Initially started on Rocephin and Zithromax but subsequently switched to Unasyn by pulmonary on 12/04/2022.  Pulmonary recommended total 3 weeks of antibiotics and switch to Augmentin on discharge.  Outpatient follow-up with pulmonary.  Pulmonary signed off on 12/04/2022.  Blood cultures negative so far -Currently on 3 L oxygen by nasal cannula.  Wean off as able.  Might need supplemental oxygen on discharge  Leukocytosis -Resolved  Right upper lobe pleural-based nodule with concern for neoplasm -In the background of history of lung cancer in the past status post right upper lobectomy -Will possibly need outpatient PET scan -Patient follow-up with pulmonary  Hypertension -Blood pressure currently stable.  Amlodipine/atenolol on hold; will possibly resume on discharge.  Mild AKI on CKD stage IIIb -Treated with  IV fluids.  Improving.  Monitor.  IV fluids have been discontinued.  Hypothyroidism -Continue Synthroid  Hyperlipidemia -Continue statin  COPD -stable.  Use nebs as needed  History of CVA, unspecified -Continue statin.  Goals of care -palliative care consultation for goals of care discussion is pending  Physical deconditioning -PT recommends home health PT  DVT prophylaxis: Heparin subcutaneous Code Status: Full Family Communication: spoke to daughter on phone Disposition Plan: Status is: Inpatient Remains inpatient appropriate because: Of severity of illness.  Still requiring supplemental oxygen and IV antibiotics.  Possible discharge home in 1 to 2 days if clinically improves.    Consultants: PCCM/palliative care  Procedures: None  Antimicrobials:  Anti-infectives (From admission, onward)    Start     Dose/Rate Route Frequency Ordered Stop   12/04/22 1800  Ampicillin-Sulbactam (UNASYN) 3 g in sodium chloride 0.9 % 100 mL IVPB        3 g 200 mL/hr over 30 Minutes Intravenous Every 12 hours 12/04/22 1742     12/03/22 2200  azithromycin (ZITHROMAX) 500 mg in sodium chloride 0.9 % 250 mL IVPB  Status:  Discontinued        500 mg 250 mL/hr over 60 Minutes Intravenous Every 24 hours 12/03/22 0414 12/04/22 1719   12/03/22 1000  cefTRIAXone (ROCEPHIN) 2 g in sodium chloride 0.9 % 100 mL IVPB  Status:  Discontinued        2 g 200 mL/hr over 30 Minutes Intravenous Every 24 hours 12/03/22 0414 12/04/22 1719   12/02/22 2330  cefTRIAXone (ROCEPHIN) 1 g in sodium chloride 0.9 % 100 mL IVPB        1 g 200 mL/hr over 30 Minutes Intravenous  Once 12/02/22 2317 12/03/22 0151   12/02/22 2330  azithromycin [facility]) tablet 500 mg        500 mg Oral  Once 12/02/22 2317 12/03/22 0114        Subjective: Patient seen and examined at bedside.  Poor historian.  Slow to respond.  Breathing feels better but still short of breath with exertion.  Asking when she can go home.  No  seizures, vomiting, agitation reported. Objective: Vitals:   12/05/22 0437 12/05/22 0500 12/05/22 0845 12/05/22 0850  BP: (!) 140/65  (!) 149/61   Pulse: 70  78   Resp:   18   Temp: 97.9 F (36.6 C)  98.5 F (36.9 C)   TempSrc: Oral  Oral   SpO2: 98%  99% 99%  Weight:  49.8 kg    Height:        Intake/Output Summary (Last 24 hours) at 12/05/2022 1019 Last data filed at 12/05/2022 0437 Gross per 24 hour  Intake 200 ml  Output 450 ml  Net -250 ml    Filed Weights   12/02/22 2210 12/05/22 0500  Weight: 45.4 kg 49.8 kg    Examination:  General: On 3 L oxygen via nasal cannula.  No distress ENT/neck: No thyromegaly.  JVD is not elevated  respiratory: Decreased breath sounds at bases bilaterally with some crackles; no wheezing  CVS: S1-S2 heard, rate controlled currently Abdominal: Soft, nontender, slightly distended; no organomegaly, bowel sounds are heard Extremities: Trace lower extremity edema; no cyanosis  CNS: Awake and alert.  Slow to respond.  Poor historian.  No focal neurologic deficit.  Moves extremities Lymph: No obvious lymphadenopathy Skin: No obvious ecchymosis/lesions  psych: Flat affect.  Currently not agitated.  Musculoskeletal: No obvious joint swelling/deformity     Data Reviewed: I have personally reviewed following labs and imaging studies  CBC: Recent Labs  Lab 12/02/22 2306 12/02/22 2323 12/02/22 2324 12/03/22 0704 12/04/22 0627 12/05/22 0450  WBC 15.8*  --   --  15.8* 14.4* 8.5  NEUTROABS  --   --   --  12.2* 9.3*  --   HGB 12.2 13.6 12.9 10.8* 11.6* 10.3*  HCT 38.5 40.0 38.0 33.8* 38.1 33.9*  MCV 98.7  --   --  98.5 100.3* 100.9*  PLT 272  --   --  224 271 382    Basic Metabolic Panel: Recent Labs  Lab 12/02/22 2306 12/02/22 2323 12/02/22 2324 12/03/22 0704 12/04/22 0627 12/05/22 0450  NA 137 140 138 137 137 138  K 4.4 5.1 5.7* 4.4 3.4* 4.2  CL 102 103  --  104 103 102  CO2 27  --   --  25 22 25   GLUCOSE 133* 133*  --   121* 98 85  BUN 24* 27*  --  21 19 24*  CREATININE 1.46* 1.50*  --  1.43* 1.39* 1.35*  CALCIUM 9.1  --   --  8.8* 9.1 8.9  MG  --   --   --  2.0 2.0 2.1  PHOS  --   --   --  4.0  --   --     GFR: Estimated Creatinine Clearance: 21.3 mL/min (A) (by C-G formula based on SCr of 1.35 mg/dL (H)). Liver Function Tests: Recent Labs  Lab 12/02/22 2306 12/03/22 0704  AST 22 23  ALT 13 11  ALKPHOS 87 70  BILITOT 0.6 0.4  PROT 7.3 6.1*  ALBUMIN 3.5 2.9*    No results for input(s): "LIPASE", "AMYLASE" in the last 168 hours. No results for input(s): "AMMONIA"  in the last 168 hours. Coagulation Profile: Recent Labs  Lab 12/03/22 0704  INR 1.1    Cardiac Enzymes: No results for input(s): "CKTOTAL", "CKMB", "CKMBINDEX", "TROPONINI" in the last 168 hours. BNP (last 3 results) No results for input(s): "PROBNP" in the last 8760 hours. HbA1C: No results for input(s): "HGBA1C" in the last 72 hours. CBG: No results for input(s): "GLUCAP" in the last 168 hours. Lipid Profile: No results for input(s): "CHOL", "HDL", "LDLCALC", "TRIG", "CHOLHDL", "LDLDIRECT" in the last 72 hours. Thyroid Function Tests: No results for input(s): "TSH", "T4TOTAL", "FREET4", "T3FREE", "THYROIDAB" in the last 72 hours. Anemia Panel: No results for input(s): "VITAMINB12", "FOLATE", "FERRITIN", "TIBC", "IRON", "RETICCTPCT" in the last 72 hours. Sepsis Labs: Recent Labs  Lab 12/02/22 2306 12/03/22 0704  PROCALCITON  --  0.11  LATICACIDVEN 1.7 1.7     Recent Results (from the past 240 hour(s))  Resp panel by RT-PCR (RSV, Flu A&B, Covid) Anterior Nasal Swab     Status: None   Collection Time: 12/02/22 11:06 PM   Specimen: Anterior Nasal Swab  Result Value Ref Range Status   SARS Coronavirus 2 by RT PCR NEGATIVE NEGATIVE Final    Comment: (NOTE) SARS-CoV-2 target nucleic acids are NOT DETECTED.  The SARS-CoV-2 RNA is generally detectable in upper respiratory specimens during the acute phase of  infection. The lowest concentration of SARS-CoV-2 viral copies this assay can detect is 138 copies/mL. A negative result does not preclude SARS-Cov-2 infection and should not be used as the sole basis for treatment or other patient management decisions. A negative result may occur with  improper specimen collection/handling, submission of specimen other than nasopharyngeal swab, presence of viral mutation(s) within the areas targeted by this assay, and inadequate number of viral copies(<138 copies/mL). A negative result must be combined with clinical observations, patient history, and epidemiological information. The expected result is Negative.  Fact Sheet for Patients:  EntrepreneurPulse.com.au  Fact Sheet for Healthcare Providers:  IncredibleEmployment.be  This test is no t yet approved or cleared by the [place] FDA and  has been authorized for detection and/or diagnosis of SARS-CoV-2 by FDA under an Emergency Use Authorization (EUA). This EUA will remain  in effect (meaning this test can be used) for the duration of the COVID-19 declaration under Section 564(b)(1) of the Act, 21 U.S.C.section 360bbb-3(b)(1), unless the authorization is terminated  or revoked sooner.       Influenza A by PCR NEGATIVE NEGATIVE Final   Influenza B by PCR NEGATIVE NEGATIVE Final    Comment: (NOTE) The Xpert Xpress SARS-CoV-2/FLU/RSV plus assay is intended as an aid in the diagnosis of influenza from Nasopharyngeal swab specimens and should not be used as a sole basis for treatment. Nasal washings and aspirates are unacceptable for Xpert Xpress SARS-CoV-2/FLU/RSV testing.  Fact Sheet for Patients: EntrepreneurPulse.com.au  Fact Sheet for Healthcare Providers: IncredibleEmployment.be  This test is not yet approved or cleared by the [place] FDA and has been authorized for detection and/or diagnosis of SARS-CoV-2  by FDA under an Emergency Use Authorization (EUA). This EUA will remain in effect (meaning this test can be used) for the duration of the COVID-19 declaration under Section 564(b)(1) of the Act, 21 U.S.C. section 360bbb-3(b)(1), unless the authorization is terminated or revoked.     Resp Syncytial Virus by PCR NEGATIVE NEGATIVE Final    Comment: (NOTE) Fact Sheet for Patients: EntrepreneurPulse.com.au  Fact Sheet for Healthcare Providers: IncredibleEmployment.be  This test is not yet approved or cleared by the  [place] Architectural technologist and has been authorized for detection and/or diagnosis of SARS-CoV-2 by FDA under an Print production planner (EUA). This EUA will remain in effect (meaning this test can be used) for the duration of the COVID-19 declaration under Section 564(b)(1) of the Act, 21 U.S.C. section 360bbb-3(b)(1), unless the authorization is terminated or revoked.  Performed at [facility], [place] [address] [place], [place] [address]   Respiratory (~20 pathogens) panel by PCR     Status: Abnormal   Collection Time: 12/02/22 11:06 PM   Specimen: Nasopharyngeal Swab; Respiratory  Result Value Ref Range Status   Adenovirus NOT DETECTED NOT DETECTED Final   Coronavirus 229E NOT DETECTED NOT DETECTED Final    Comment: (NOTE) The Coronavirus on the Respiratory Panel, DOES NOT test for the novel  Coronavirus (2019 nCoV)    Coronavirus HKU1 NOT DETECTED NOT DETECTED Final   Coronavirus NL63 NOT DETECTED NOT DETECTED Final   Coronavirus OC43 NOT DETECTED NOT DETECTED Final   Metapneumovirus NOT DETECTED NOT DETECTED Final   Rhinovirus / Enterovirus NOT DETECTED NOT DETECTED Final   Influenza A NOT DETECTED NOT DETECTED Final   Influenza B NOT DETECTED NOT DETECTED Final   Parainfluenza Virus 1 NOT DETECTED NOT DETECTED Final   Parainfluenza Virus 2 NOT DETECTED NOT DETECTED Final   Parainfluenza Virus 3 NOT DETECTED NOT DETECTED  Final   Parainfluenza Virus 4 DETECTED (A) NOT DETECTED Final   Respiratory Syncytial Virus NOT DETECTED NOT DETECTED Final   Bordetella pertussis NOT DETECTED NOT DETECTED Final   Bordetella Parapertussis NOT DETECTED NOT DETECTED Final   Chlamydophila pneumoniae NOT DETECTED NOT DETECTED Final   Mycoplasma pneumoniae NOT DETECTED NOT DETECTED Final    Comment: Performed at [facility], [place]. [address], [place], [place] [address]  Blood culture (routine x 2)     Status: None (Preliminary result)   Collection Time: 12/03/22 12:15 AM   Specimen: BLOOD RIGHT FOREARM  Result Value Ref Range Status   Specimen Description BLOOD RIGHT FOREARM  Final   Special Requests   Final    BOTTLES DRAWN AEROBIC AND ANAEROBIC Blood Culture adequate volume   Culture   Final    NO GROWTH 1 DAY Performed at [facility], [place] [address] [place], [place] [address]    Report Status PENDING  Incomplete  Respiratory (~20 pathogens) panel by PCR     Status: Abnormal   Collection Time: 12/03/22 11:00 AM   Specimen: Nasopharyngeal Swab; Respiratory  Result Value Ref Range Status   Adenovirus NOT DETECTED NOT DETECTED Final   Coronavirus 229E NOT DETECTED NOT DETECTED Final    Comment: (NOTE) The Coronavirus on the Respiratory Panel, DOES NOT test for the novel  Coronavirus (2019 nCoV)    Coronavirus HKU1 NOT DETECTED NOT DETECTED Final   Coronavirus NL63 NOT DETECTED NOT DETECTED Final   Coronavirus OC43 NOT DETECTED NOT DETECTED Final   Metapneumovirus NOT DETECTED NOT DETECTED Final   Rhinovirus / Enterovirus NOT DETECTED NOT DETECTED Final   Influenza A NOT DETECTED NOT DETECTED Final   Influenza B NOT DETECTED NOT DETECTED Final   Parainfluenza Virus 1 NOT DETECTED NOT DETECTED Final   Parainfluenza Virus 2 NOT DETECTED NOT DETECTED Final   Parainfluenza Virus 3 NOT DETECTED NOT DETECTED Final   Parainfluenza Virus 4 DETECTED (A) NOT DETECTED Final   Respiratory Syncytial Virus NOT  DETECTED NOT DETECTED Final   Bordetella pertussis NOT DETECTED NOT DETECTED Final  Bordetella Parapertussis NOT DETECTED NOT DETECTED Final   Chlamydophila pneumoniae NOT DETECTED NOT DETECTED Final   Mycoplasma pneumoniae NOT DETECTED NOT DETECTED Final    Comment: Performed at [facility], [place] [address], [place], [place] [address]  Blood culture (routine x 2)     Status: None (Preliminary result)   Collection Time: 12/03/22  8:32 PM   Specimen: BLOOD LEFT ARM  Result Value Ref Range Status   Specimen Description BLOOD LEFT ARM  Final   Special Requests   Final    BOTTLES DRAWN AEROBIC ONLY Blood Culture adequate volume   Culture   Final    NO GROWTH < 12 HOURS Performed at [facility], [place] [address] [place], [place] [address]    Report Status PENDING  Incomplete  MRSA Next Gen by PCR, Nasal     Status: None   Collection Time: 12/04/22  5:50 PM   Specimen: Nasal Mucosa; Nasal Swab  Result Value Ref Range Status   MRSA by PCR Next Gen NOT DETECTED NOT DETECTED Final    Comment: (NOTE) The GeneXpert MRSA Assay (FDA approved for NASAL specimens only), is one component of a comprehensive MRSA colonization surveillance program. It is not intended to diagnose MRSA infection nor to guide or monitor treatment for MRSA infections. Test performance is not FDA approved in patients less than 12 years old. Performed at [facility], [place] [address], [place], [place] [address]          Radiology Studies: No results found.      Scheduled Meds:  atorvastatin  10 mg Oral Daily   heparin  5,000 Units Subcutaneous Q8H   levothyroxine  50 mcg Oral Q0600   umeclidinium-vilanterol  1 puff Inhalation Daily   Continuous Infusions:  ampicillin-sulbactam (UNASYN) IV 3 g (12/05/22 0518)          Aline August, MD [facility] 12/05/2022, 10:19 AM

## 2022-12-06 DIAGNOSIS — J189 Pneumonia, unspecified organism: Secondary | ICD-10-CM | POA: Diagnosis not present

## 2022-12-06 LAB — CBC WITH DIFFERENTIAL/PLATELET
Abs Immature Granulocytes: 0.03 10*3/uL (ref 0.00–0.07)
Basophils Absolute: 0 10*3/uL (ref 0.0–0.1)
Basophils Relative: 0 %
Eosinophils Absolute: 0.1 10*3/uL (ref 0.0–0.5)
Eosinophils Relative: 1 %
HCT: 32.3 % — ABNORMAL LOW (ref 36.0–46.0)
Hemoglobin: 10.2 g/dL — ABNORMAL LOW (ref 12.0–15.0)
Immature Granulocytes: 0 %
Lymphocytes Relative: 16 %
Lymphs Abs: 1.5 10*3/uL (ref 0.7–4.0)
MCH: 31 pg (ref 26.0–34.0)
MCHC: 31.6 g/dL (ref 30.0–36.0)
MCV: 98.2 fL (ref 80.0–100.0)
Monocytes Absolute: 1 10*3/uL (ref 0.1–1.0)
Monocytes Relative: 10 %
Neutro Abs: 6.8 10*3/uL (ref 1.7–7.7)
Neutrophils Relative %: 73 %
Platelets: 233 10*3/uL (ref 150–400)
RBC: 3.29 MIL/uL — ABNORMAL LOW (ref 3.87–5.11)
RDW: 13.2 % (ref 11.5–15.5)
WBC: 9.5 10*3/uL (ref 4.0–10.5)
nRBC: 0 % (ref 0.0–0.2)

## 2022-12-06 LAB — BASIC METABOLIC PANEL
Anion gap: 11 (ref 5–15)
BUN: 22 mg/dL (ref 8–23)
CO2: 25 mmol/L (ref 22–32)
Calcium: 8.8 mg/dL — ABNORMAL LOW (ref 8.9–10.3)
Chloride: 104 mmol/L (ref 98–111)
Creatinine, Ser: 1.2 mg/dL — ABNORMAL HIGH (ref 0.44–1.00)
GFR, Estimated: 43 mL/min — ABNORMAL LOW (ref >60)
Glucose, Bld: 101 mg/dL — ABNORMAL HIGH (ref 70–99)
Potassium: 3.8 mmol/L (ref 3.5–5.1)
Sodium: 140 mmol/L (ref 135–145)

## 2022-12-06 LAB — MAGNESIUM: Magnesium: 2 mg/dL (ref 1.7–2.4)

## 2022-12-06 MED ORDER — AMOXICILLIN-POT CLAVULANATE 875-125 MG PO TABS: 1.0000 | ORAL_TABLET | Freq: Two times a day (BID) | ORAL | 0 refills | Status: AC

## 2022-12-06 NOTE — Progress Notes (Signed)
Physical Therapy Treatment Patient Details Name: Valerie Young MRN: [id number] DOB: [birth year] Today's Date: 12/06/2022   History of Present Illness Pt is 89 yo female admitted with acute resp failure, para influenza PNE, and possible necrotizing PNE.  Pt with hx including HTN, CKD, hypothyroidism, COPD, HLD, CVA, lung CA s/p R upper lobectomy    PT Comments    Pt was seen for abbreviated session due to her energy and fatigue, but was positive about the outcome of warming up mm's and doing exercises on the bed.  Repositioning done and note her comfort, and encouraged her to try to eat lunch.  Pt is expecting to go home with family assist, and will continue on with this plan to increase standing endurance and LE strength to improve gait for home on RW or AD as tolerated.   Recommendations for follow up therapy are one component of a multi-disciplinary discharge planning process, led by the attending physician.  Recommendations may be updated based on patient status, additional functional criteria and insurance authorization.  Follow Up Recommendations  Home health PT     Assistance Recommended at Discharge Intermittent Supervision/Assistance  Patient can return home with the following A little help with walking and/or transfers;A little help with bathing/dressing/bathroom;Assistance with cooking/housework;Help with stairs or ramp for entrance   Equipment Recommendations  None recommended by PT    Recommendations for Other Services       Precautions / Restrictions Precautions Precautions: Fall Restrictions Weight Bearing Restrictions: No     Mobility  Bed Mobility Overal bed mobility: Needs Assistance Bed Mobility: Rolling Rolling: Mod assist              Transfers                   General transfer comment: declined    Ambulation/Gait                   Stairs             Wheelchair Mobility    Modified Rankin (Stroke Patients Only)        Balance                                            Cognition Arousal/Alertness: Awake/alert Behavior During Therapy: Anxious Overall Cognitive Status: No family/caregiver present to determine baseline cognitive functioning                                 General Comments: pt is describing her challenges to move, refused OOB        Exercises General Exercises - Lower Extremity Ankle Circles/Pumps: AAROM, 5 reps Quad Sets: AROM, 10 reps Gluteal Sets: AROM, 10 reps Heel Slides: AROM, AAROM, 10 reps Hip ABduction/ADduction: AAROM, 10 reps Hip Flexion/Marching: AAROM, 10 reps    General Comments General comments (skin integrity, edema, etc.): pt is up to sit on bed with elevation of head but declines to be out.  Reviewed ROM to legs with good effort, reporting good tolerance and feeling warmer      Pertinent Vitals/Pain Pain Assessment Pain Assessment: No/denies pain    Home Living                          Prior Function  PT Goals (current goals can now be found in the care plan section) Acute Rehab PT Goals Patient Stated Goal: return home Progress towards PT goals: Not progressing toward goals - comment    Frequency    Min 3X/week      PT Plan Current plan remains appropriate    Co-evaluation              AM-PAC PT "6 Clicks" Mobility   Outcome Measure  Help needed turning from your back to your side while in a flat bed without using bedrails?: None Help needed moving from lying on your back to sitting on the side of a flat bed without using bedrails?: A Little Help needed moving to and from a bed to a chair (including a wheelchair)?: A Little Help needed standing up from a chair using your arms (e.g., wheelchair or bedside chair)?: A Little Help needed to walk in hospital room?: A Little Help needed climbing 3-5 steps with a railing? : A Little 6 Click Score: 19    End of Session    Activity Tolerance: Patient tolerated treatment well Patient left: with call bell/phone within reach;in chair Nurse Communication: Mobility status PT Visit Diagnosis: Unsteadiness on feet (R26.81);Muscle weakness (generalized) (M62.81)     Time: 6195-9362 PT Time Calculation (min) (ACUTE ONLY): 15 min  Charges:  $Therapeutic Exercise: 8-22 mins       Ivar Drape 12/06/2022, 4:02 PM  Samul Dada, PT PhD Acute Rehab Dept. Number: [facility] [id number] and [facility] [phone]

## 2022-12-06 NOTE — Progress Notes (Signed)
Awaiting for pt's Home O2. Notified daughter about pt's d/c.

## 2022-12-06 NOTE — Progress Notes (Signed)
1/12 Patient under Droplet Precaution, I spoke to patient's son Genevie Cheshire) via telephone and received verbal acknowledgement of IMM Letter. Copy of IMM Letter will be mailed to patient's address on file.

## 2022-12-06 NOTE — Progress Notes (Signed)
SATURATION QUALIFICATIONS: (This note is used to comply with regulatory documentation for home oxygen)  Patient Saturations on Room Air at Rest = 99%  Patient Saturations on Room Air while Ambulating =86%  Patient Saturations on 2 Liters of oxygen while Ambulating = 92%  Please briefly explain why patient needs home oxygen: Pt. Desat to 80's on Room Air while ambulating.

## 2022-12-06 NOTE — TOC Progression Note (Addendum)
Transition of Care (TOC) - Progression Note   Home oxygen ordered through [employer], will be delivered to room today prior to discharge.    Jerilynn Som with [facility] aware patient discharging today    Discussed with patient  Patient Details  Name: Valerie Young MRN: [id number] Date of Birth: 09/08/1929  Transition of Care [facility]) CM/SW Contact  Raylen Ken, Adria Devon, RN Phone Number: 12/06/2022, 10:54 AM  Clinical Narrative:       Expected Discharge Plan: Home w Home Health Services Barriers to Discharge: Continued Medical Work up  Expected Discharge Plan and Services   Discharge Planning Services: CM Consult Post Acute Care Choice: Home Health Living arrangements for the past 2 months: Apartment Expected Discharge Date: 12/06/22               DME Arranged: N/A         HH Arranged: PT HH Agency: [facility] Date HH Agency Contacted: 12/05/22 Time HH Agency Contacted: 1315 Representative spoke with at [facility] Agency: Jerilynn Som   Social Determinants of Health (SDOH) Interventions SDOH Screenings   Tobacco Use: Medium Risk (12/03/2022)    Readmission Risk Interventions     No data to display

## 2022-12-06 NOTE — Telephone Encounter (Signed)
Yes you can use a slot on a day with multiple blocked spots if possible.

## 2022-12-06 NOTE — Progress Notes (Signed)
Nsg Discharge Note  Admit Date:  12/02/2022 Discharge date: 12/06/2022   Georgana Curio to be D/C'd Home per MD order.  AVS completed.   Removed IV-CDI. Reviewed d/c paperwork with patient and caregiver. Wheeled stable patient and belongings to main entrance to d/c to home. Patient/caregiver able to verbalize understanding.  Discharge Medication: Allergies as of 12/06/2022       Reactions   Sulfamethoxazole-trimethoprim Rash   Tramadol Hcl    Other reaction(s): mental status changes        Medication List     TAKE these medications    acetaminophen 325 MG tablet Commonly known as: TYLENOL Take 325 mg by mouth daily.   albuterol 108 (90 Base) MCG/ACT inhaler Commonly known as: VENTOLIN HFA Inhale 2 puffs into the lungs every 6 (six) hours as needed for wheezing or shortness of breath.   amLODipine 5 MG tablet Commonly known as: NORVASC TAKE 1 TABLET (5 MG TOTAL) BY MOUTH AT BEDTIME. What changed: when to take this   amoxicillin-clavulanate 875-125 MG tablet Commonly known as: AUGMENTIN Take 1 tablet by mouth 2 (two) times daily for 17 days.   atenolol 50 MG tablet Commonly known as: TENORMIN Take 1 tablet (50 mg total) by mouth daily.   atorvastatin 10 MG tablet Commonly known as: LIPITOR Take 10 mg by mouth daily.   cetirizine 10 MG tablet Commonly known as: ZYRTEC TAKE 1 TABLET (10 MG TOTAL) BY MOUTH AT BEDTIME. What changed: when to take this   levothyroxine 50 MCG tablet Commonly known as: SYNTHROID Take 50 mcg by mouth daily before breakfast.   polyvinyl alcohol 1.4 % ophthalmic solution Commonly known as: LIQUIFILM TEARS Place 1 drop into both eyes as needed for dry eyes.               Durable Medical Equipment  (From admission, onward)           Start     Ordered   12/06/22 1012  For home use only DME oxygen  Once       Question Answer Comment  Length of Need 6 Months   Mode or (Route) Nasal cannula   Liters per Minute 2    Frequency Continuous (stationary and portable oxygen unit needed)   Oxygen conserving device Yes   Oxygen delivery system Gas      12/06/22 1011            Discharge Assessment: Vitals:   12/06/22 1045 12/06/22 1200  BP:    Pulse: (!) 125 89  Resp:    Temp:    SpO2: 92%    Skin clean, dry and intact without evidence of skin break down, no evidence of skin tears noted. IV catheter discontinued intact. Site without signs and symptoms of complications - no redness or edema noted at insertion site, patient denies c/o pain - only slight tenderness at site.  Dressing with slight pressure applied.  D/c Instructions-Education: Discharge instructions given to patient/family with verbalized understanding. D/c education completed with patient/family including follow up instructions, medication list, d/c activities limitations if indicated, with other d/c instructions as indicated by MD - patient able to verbalize understanding, all questions fully answered. Patient instructed to return to ED, call 911, or call MD for any changes in condition.  Patient escorted via WC, and D/C home via private auto.  Karolee Ohs, RN 12/06/2022 6:49 PM

## 2022-12-06 NOTE — Telephone Encounter (Signed)
Scheduled pt for 12/19/22 @3pm 

## 2022-12-06 NOTE — Discharge Summary (Signed)
Physician Discharge Summary  CHALISA KOBLER VLR:[id number] DOB: 1929-05-05 DOA: 12/02/2022  PCP: Wilfrid Lund, PA  Admit date: 12/02/2022 Discharge date: 12/06/2022  Admitted From: Home Disposition: Home  Recommendations for Outpatient Follow-up:  Follow up with PCP in 1 week with repeat CBC/BMP Outpatient follow-up with pulm. Recommend outpatient evaluation and follow-up by palliative care Follow up in ED if symptoms worsen or new appear   Home Health: No Equipment/Devices: Oxygen via nasal cannula at 2 L/min  Discharge Condition: Guarded CODE STATUS: Full Diet recommendation: Heart healthy  Brief/Interim Summary: 89 y.o. female with medical history significant of  Hypertension, CKDIII,hypothyroidism, COPD, Hyperlipidemia, CVA, lung ca s/p right upper lobectomy presented with worsening cough and shortness of breath.  On presentation, WBC was 15.8, COVID-19/influenza/RSV PCR were negative.  CTA chest showed no evidence of PE but showed interval development of patchy alveolar infiltrates in the left mid to lower lung field, with increased cystic changes in the parenchyma which could indicate upper necrotizing pneumonic component along with 2.1 x 1.5 cm pleural-based nodule in the posterior medial right upper lobe, previously groundglass and corrector and concerning for neoplasm.  She was started on IV antibiotics.  PCCM was consulted: Antibiotics were switched to IV Unasyn by pulmonary who recommended to switch to oral Augmentin for 3 weeks total on discharge with outpatient follow-up with pulmonary.  Currently still requiring 2 L oxygen via nasal cannula but patient feels okay to go home today.  She will be discharged home today on oral Augmentin.  Recommend outpatient evaluation and follow-up by palliative care as well.  Discharge Diagnoses:   Possible necrotizing pneumonia Acute respiratory failure with hypoxia Parainfluenza pneumonia -As evidenced on CTA chest.   -COVID-19/influenza/RSV PCR were negative.  Respiratory panel PCR was positive for parainfluenza -Initially started on Rocephin and Zithromax but subsequently switched to Unasyn by pulmonary on 12/04/2022.  Pulmonary recommended total 3 weeks of antibiotics and switch to Augmentin on discharge.  Outpatient follow-up with pulmonary.  Pulmonary signed off on 12/04/2022.  Blood cultures negative so far -Currently still requiring 2 L oxygen via nasal cannula on ambulation because of saturations dropping to the 80s.  Home oxygen will be arranged.   -patient feels okay to go home today.  She will be discharged home today on oral Augmentin to finish total 3 weeks course of therapy.  Outpatient follow-up with PCP and pulmonary.    Leukocytosis -Resolved   Right upper lobe pleural-based nodule with concern for neoplasm -In the background of history of lung cancer in the past status post right upper lobectomy -Will possibly need outpatient PET scan -Out patient follow-up with pulmonary   Hypertension -Blood pressure currently stable.  Amlodipine/atenolol on hold; will resume on discharge.   Mild AKI on CKD stage IIIb -Treated with IV fluids.  Improved.  Outpatient follow-up of BMP.  IV fluids have been discontinued.   Hypothyroidism -Continue Synthroid   Hyperlipidemia -Continue statin   COPD -stable.  Use albuterol as an needed.  Outpatient follow-up with pulmonary.  History of CVA, unspecified -Continue statin.   Goals of care -palliative care consultation for goals of care discussion is still pending. -This can happen as an outpatient   Physical deconditioning -PT recommends home health PT   Discharge Instructions  Discharge Instructions     Amb Referral to Palliative Care   Complete by: As directed    Ambulatory referral to Pulmonology   Complete by: As directed    Hospital follow-up   Reason for referral:  Other   Diet - low sodium heart healthy   Complete by: As  directed    Increase activity slowly   Complete by: As directed       Allergies as of 12/06/2022       Reactions   Sulfamethoxazole-trimethoprim Rash   Tramadol Hcl    Other reaction(s): mental status changes        Medication List     TAKE these medications    acetaminophen 325 MG tablet Commonly known as: TYLENOL Take 325 mg by mouth daily.   albuterol 108 (90 Base) MCG/ACT inhaler Commonly known as: VENTOLIN HFA Inhale 2 puffs into the lungs every 6 (six) hours as needed for wheezing or shortness of breath.   amLODipine 5 MG tablet Commonly known as: NORVASC TAKE 1 TABLET (5 MG TOTAL) BY MOUTH AT BEDTIME. What changed: when to take this   amoxicillin-clavulanate 875-125 MG tablet Commonly known as: AUGMENTIN Take 1 tablet by mouth 2 (two) times daily for 17 days.   atenolol 50 MG tablet Commonly known as: TENORMIN Take 1 tablet (50 mg total) by mouth daily.   atorvastatin 10 MG tablet Commonly known as: LIPITOR Take 10 mg by mouth daily.   cetirizine 10 MG tablet Commonly known as: ZYRTEC TAKE 1 TABLET (10 MG TOTAL) BY MOUTH AT BEDTIME. What changed: when to take this   levothyroxine 50 MCG tablet Commonly known as: SYNTHROID Take 50 mcg by mouth daily before breakfast.   polyvinyl alcohol 1.4 % ophthalmic solution Commonly known as: LIQUIFILM TEARS Place 1 drop into both eyes as needed for dry eyes.               Durable Medical Equipment  (From admission, onward)           Start     Ordered   12/06/22 1012  For home use only DME oxygen  Once       Question Answer Comment  Length of Need 6 Months   Mode or (Route) Nasal cannula   Liters per Minute 2   Frequency Continuous (stationary and portable oxygen unit needed)   Oxygen conserving device Yes   Oxygen delivery system Gas      12/06/22 [place] Follow up.   Specialty: [facility] Contact information: [place] [address] [phone]         Lois Huxley, PA. Schedule an appointment as soon as possible for a visit in 1 week(s).   Specialty: Family Medicine Contact information: [place] [address] [phone]                Allergies  Allergen Reactions   Sulfamethoxazole-Trimethoprim Rash   Tramadol Hcl     Other reaction(s): mental status changes    Consultations: Palliative care consultation pending. Pulmonary  Procedures/Studies: CT Angio Chest PE W/Cm &/Or Wo Cm  Result Date: 12/03/2022 CLINICAL DATA:  89 year old with history of COPD and lung cancer, presents with shortness of breath and cough with hemoptysis. Pulmonary embolism suspected. EXAM: CT ANGIOGRAPHY CHEST WITH CONTRAST TECHNIQUE: Multidetector CT imaging of the chest was performed using the standard protocol during bolus administration of intravenous contrast. Multiplanar CT image reconstructions and MIPs were obtained to evaluate the vascular anatomy. RADIATION DOSE REDUCTION: This exam was performed according to the departmental  dose-optimization program which includes automated exposure control, adjustment of the mA and/or kV according to patient size and/or use of iterative reconstruction technique. CONTRAST:  69mL OMNIPAQUE IOHEXOL 350 MG/ML SOLN COMPARISON:  PA and lateral 12/02/2022, chest radiograph 06/12/2020, CTA chest 06/09/2020 FINDINGS: Cardiovascular: There is no appreciable arterial embolus or dilatation. Left upper lobe arteries are poorly opacified as before, with chronic left upper lobe collapse again suspected post treatment related and with a small chronic loculated left apical pleural effusion. There is mild cardiomegaly. There is patchy calcification in the circumflex and LAD coronary arteries and no pericardial effusion. There is moderate mixed plaque in the aorta, proximal great vessels. The pulmonary  veins are decompressed. No aortic aneurysm or dissection is seen and no significant aortic or great vessel stenosis. Mediastinum/Nodes: No intrathoracic or axillary adenopathy is seen. No filling defect in the trachea and main bronchi. There is no esophageal thickening. Thyroid gland is atrophic without mass. Lungs/Pleura: Remote right upper lobe hypertrophy. Chronic collapse again noted in the left upper lobe with chronic loculated left apical pleural fluid and with left-sided chronic volume loss, again suspected treatment related. There are emphysematous and scarring changes of the lungs. No new pleural effusion is seen. No pneumothorax. There are increased patchy alveolar infiltrates in the left mid to lower lung field most likely due to interval new pneumonia. Some of the consolidation demonstrates increased cystic changes in the parenchyma which could indicate a necrotizing pneumonic component. On the right, there is increased patchy ground-glass opacity in the basal segments of the lower lobe. This likely indicates additional areas of pneumonitis. Posteromedially in the right upper lobe, there is a paraspinal pleural-based 2.1 x 1.5 cm nodule which previously had a more ground-glass character and is concerning for neoplasm, although it is not significantly changed in overall size. PET-CT evaluation is recommended. There are coarse scarring changes in the right apex. No other significant or new pulmonary opacities are seen. Upper Abdomen: Not well seen due to breathing motion and the patient's overlying left arm creating streak artifacts. No obvious acute changes. Musculoskeletal: There is osteopenia with mild degenerative change in the thoracic spine. No destructive bone lesion is seen. No chest wall mass. Review of the MIP images confirms the above findings. IMPRESSION: 1. No evidence of arterial embolus, with suboptimal depiction of left upper lobe arteries due to chronic left upper lobe collapse. 2. Chronic  left upper lobe collapse with chronic loculated left apical pleural effusion. Old right upper lobectomy. 3. Interval development of patchy alveolar infiltrates in the left mid to lower lung field, with increased cystic changes in the parenchyma which could indicate a necrotizing pneumonic component. 4. Increased patchy ground-glass opacity in the basal segments of the right lower lobe, likely additional areas of pneumonitis. 5. 2.1 x 1.5 cm pleural-based nodule in the posteromedial right upper lobe, previously ground-glass in character and concerning for neoplasm. PET-CT recommended. 6. Aortic and coronary artery atherosclerosis.  Mild cardiomegaly. Electronically Signed   By: Almira Bar M.D.   On: 12/03/2022 00:59   CUP PACEART REMOTE DEVICE CHECK  Result Date: 11/12/2022 ILR summary report received. Battery status OK. Normal device function. No new symptom, tachy, brady, or pause episodes. No new AF episodes. Monthly summary reports and ROV/PRN LA     Subjective: Patient seen and examined at bedside.  Feels better and wants to go home.  Denies worsening cough, fever or vomiting.  Slow to respond, poor historian.  Discharge Exam: Vitals:   12/06/22 0548 12/06/22  0731  BP: (!) 146/81 (!) 143/87  Pulse: 85 79  Resp: 18 16  Temp: 98.3 F (36.8 C) 98.5 F (36.9 C)  SpO2: 94% 100%    General: Pt is alert, awake, not in acute distress.  Currently on room air.  Slow to respond, poor historian.  Answers some questions appropriately. Cardiovascular: rate controlled, S1/S2 + Respiratory: bilateral decreased breath sounds at bases with some scattered crackles Abdominal: Soft, NT, ND, bowel sounds + Extremities: Trace lower extremity edema; no cyanosis    The results of significant diagnostics from this hospitalization (including imaging, microbiology, ancillary and laboratory) are listed below for reference.     Microbiology: Recent Results (from the past 240 hour(s))  Resp panel by  RT-PCR (RSV, Flu A&B, Covid) Anterior Nasal Swab     Status: None   Collection Time: 12/02/22 11:06 PM   Specimen: Anterior Nasal Swab  Result Value Ref Range Status   SARS Coronavirus 2 by RT PCR NEGATIVE NEGATIVE Final    Comment: (NOTE) SARS-CoV-2 target nucleic acids are NOT DETECTED.  The SARS-CoV-2 RNA is generally detectable in upper respiratory specimens during the acute phase of infection. The lowest concentration of SARS-CoV-2 viral copies this assay can detect is 138 copies/mL. A negative result does not preclude SARS-Cov-2 infection and should not be used as the sole basis for treatment or other patient management decisions. A negative result may occur with  improper specimen collection/handling, submission of specimen other than nasopharyngeal swab, presence of viral mutation(s) within the areas targeted by this assay, and inadequate number of viral copies(<138 copies/mL). A negative result must be combined with clinical observations, patient history, and epidemiological information. The expected result is Negative.  Fact Sheet for Patients:  BloggerCourse.com  Fact Sheet for Healthcare Providers:  SeriousBroker.it  This test is no t yet approved or cleared by the [place] FDA and  has been authorized for detection and/or diagnosis of SARS-CoV-2 by FDA under an Emergency Use Authorization (EUA). This EUA will remain  in effect (meaning this test can be used) for the duration of the COVID-19 declaration under Section 564(b)(1) of the Act, 21 U.S.C.section 360bbb-3(b)(1), unless the authorization is terminated  or revoked sooner.       Influenza A by PCR NEGATIVE NEGATIVE Final   Influenza B by PCR NEGATIVE NEGATIVE Final    Comment: (NOTE) The Xpert Xpress SARS-CoV-2/FLU/RSV plus assay is intended as an aid in the diagnosis of influenza from Nasopharyngeal swab specimens and should not be used as a sole basis  for treatment. Nasal washings and aspirates are unacceptable for Xpert Xpress SARS-CoV-2/FLU/RSV testing.  Fact Sheet for Patients: BloggerCourse.com  Fact Sheet for Healthcare Providers: SeriousBroker.it  This test is not yet approved or cleared by the [place] FDA and has been authorized for detection and/or diagnosis of SARS-CoV-2 by FDA under an Emergency Use Authorization (EUA). This EUA will remain in effect (meaning this test can be used) for the duration of the COVID-19 declaration under Section 564(b)(1) of the Act, 21 U.S.C. section 360bbb-3(b)(1), unless the authorization is terminated or revoked.     Resp Syncytial Virus by PCR NEGATIVE NEGATIVE Final    Comment: (NOTE) Fact Sheet for Patients: BloggerCourse.com  Fact Sheet for Healthcare Providers: SeriousBroker.it  This test is not yet approved or cleared by the [place] FDA and has been authorized for detection and/or diagnosis of SARS-CoV-2 by FDA under an Emergency Use Authorization (EUA). This EUA will remain in effect (meaning this test can  be used) for the duration of the COVID-19 declaration under Section 564(b)(1) of the Act, 21 U.S.C. section 360bbb-3(b)(1), unless the authorization is terminated or revoked.  Performed at [facility], [address] [place], [place] [address]   Respiratory (~20 pathogens) panel by PCR     Status: Abnormal   Collection Time: 12/02/22 11:06 PM   Specimen: Nasopharyngeal Swab; Respiratory  Result Value Ref Range Status   Adenovirus NOT DETECTED NOT DETECTED Final   Coronavirus 229E NOT DETECTED NOT DETECTED Final    Comment: (NOTE) The Coronavirus on the Respiratory Panel, DOES NOT test for the novel  Coronavirus (2019 nCoV)    Coronavirus HKU1 NOT DETECTED NOT DETECTED Final   Coronavirus NL63 NOT DETECTED NOT DETECTED Final   Coronavirus OC43 NOT  DETECTED NOT DETECTED Final   Metapneumovirus NOT DETECTED NOT DETECTED Final   Rhinovirus / Enterovirus NOT DETECTED NOT DETECTED Final   Influenza A NOT DETECTED NOT DETECTED Final   Influenza B NOT DETECTED NOT DETECTED Final   Parainfluenza Virus 1 NOT DETECTED NOT DETECTED Final   Parainfluenza Virus 2 NOT DETECTED NOT DETECTED Final   Parainfluenza Virus 3 NOT DETECTED NOT DETECTED Final   Parainfluenza Virus 4 DETECTED (A) NOT DETECTED Final   Respiratory Syncytial Virus NOT DETECTED NOT DETECTED Final   Bordetella pertussis NOT DETECTED NOT DETECTED Final   Bordetella Parapertussis NOT DETECTED NOT DETECTED Final   Chlamydophila pneumoniae NOT DETECTED NOT DETECTED Final   Mycoplasma pneumoniae NOT DETECTED NOT DETECTED Final    Comment: Performed at [facility], [address], [place], [place] [address]  Blood culture (routine x 2)     Status: None (Preliminary result)   Collection Time: 12/03/22 12:15 AM   Specimen: BLOOD RIGHT FOREARM  Result Value Ref Range Status   Specimen Description BLOOD RIGHT FOREARM  Final   Special Requests   Final    BOTTLES DRAWN AEROBIC AND ANAEROBIC Blood Culture adequate volume   Culture   Final    NO GROWTH 2 DAYS Performed at [facility], [address], [place], [place] [address]    Report Status PENDING  Incomplete  Respiratory (~20 pathogens) panel by PCR     Status: Abnormal   Collection Time: 12/03/22 11:00 AM   Specimen: Nasopharyngeal Swab; Respiratory  Result Value Ref Range Status   Adenovirus NOT DETECTED NOT DETECTED Final   Coronavirus 229E NOT DETECTED NOT DETECTED Final    Comment: (NOTE) The Coronavirus on the Respiratory Panel, DOES NOT test for the novel  Coronavirus (2019 nCoV)    Coronavirus HKU1 NOT DETECTED NOT DETECTED Final   Coronavirus NL63 NOT DETECTED NOT DETECTED Final   Coronavirus OC43 NOT DETECTED NOT DETECTED Final   Metapneumovirus NOT DETECTED NOT DETECTED Final   Rhinovirus /  Enterovirus NOT DETECTED NOT DETECTED Final   Influenza A NOT DETECTED NOT DETECTED Final   Influenza B NOT DETECTED NOT DETECTED Final   Parainfluenza Virus 1 NOT DETECTED NOT DETECTED Final   Parainfluenza Virus 2 NOT DETECTED NOT DETECTED Final   Parainfluenza Virus 3 NOT DETECTED NOT DETECTED Final   Parainfluenza Virus 4 DETECTED (A) NOT DETECTED Final   Respiratory Syncytial Virus NOT DETECTED NOT DETECTED Final   Bordetella pertussis NOT DETECTED NOT DETECTED Final   Bordetella Parapertussis NOT DETECTED NOT DETECTED Final   Chlamydophila pneumoniae NOT DETECTED NOT DETECTED Final   Mycoplasma pneumoniae NOT DETECTED NOT DETECTED Final    Comment: Performed  at [facility], [address], [place], [place] [address]  Blood culture (routine x 2)     Status: None (Preliminary result)   Collection Time: 12/03/22  8:32 PM   Specimen: BLOOD LEFT ARM  Result Value Ref Range Status   Specimen Description BLOOD LEFT ARM  Final   Special Requests   Final    BOTTLES DRAWN AEROBIC ONLY Blood Culture adequate volume   Culture   Final    NO GROWTH 2 DAYS Performed at [facility], [address] [place], [place] [address]    Report Status PENDING  Incomplete  MRSA Next Gen by PCR, Nasal     Status: None   Collection Time: 12/04/22  5:50 PM   Specimen: Nasal Mucosa; Nasal Swab  Result Value Ref Range Status   MRSA by PCR Next Gen NOT DETECTED NOT DETECTED Final    Comment: (NOTE) The GeneXpert MRSA Assay (FDA approved for NASAL specimens only), is one component of a comprehensive MRSA colonization surveillance program. It is not intended to diagnose MRSA infection nor to guide or monitor treatment for MRSA infections. Test performance is not FDA approved in patients less than 79 years old. Performed at [facility], [address] [place], [place] [address]      Labs: BNP (last 3 results) No results for input(s): "BNP" in the last 8760 hours. Basic Metabolic  Panel: Recent Labs  Lab 12/02/22 2306 12/02/22 2323 12/02/22 2324 12/03/22 0704 12/04/22 0627 12/05/22 0450 12/06/22 0334  NA 137 140 138 137 137 138 140  K 4.4 5.1 5.7* 4.4 3.4* 4.2 3.8  CL 102 103  --  104 103 102 104  CO2 27  --   --  25 22 25 25   GLUCOSE 133* 133*  --  121* 98 85 101*  BUN 24* 27*  --  21 19 24* 22  CREATININE 1.46* 1.50*  --  1.43* 1.39* 1.35* 1.20*  CALCIUM 9.1  --   --  8.8* 9.1 8.9 8.8*  MG  --   --   --  2.0 2.0 2.1 2.0  PHOS  --   --   --  4.0  --   --   --    Liver Function Tests: Recent Labs  Lab 12/02/22 2306 12/03/22 0704  AST 22 23  ALT 13 11  ALKPHOS 87 70  BILITOT 0.6 0.4  PROT 7.3 6.1*  ALBUMIN 3.5 2.9*   No results for input(s): "LIPASE", "AMYLASE" in the last 168 hours. No results for input(s): "AMMONIA" in the last 168 hours. CBC: Recent Labs  Lab 12/02/22 2306 12/02/22 2323 12/02/22 2324 12/03/22 0704 12/04/22 0627 12/05/22 0450 12/06/22 0334  WBC 15.8*  --   --  15.8* 14.4* 8.5 9.5  NEUTROABS  --   --   --  12.2* 9.3*  --  6.8  HGB 12.2   < > 12.9 10.8* 11.6* 10.3* 10.2*  HCT 38.5   < > 38.0 33.8* 38.1 33.9* 32.3*  MCV 98.7  --   --  98.5 100.3* 100.9* 98.2  PLT 272  --   --  224 271 237 233   < > = values in this interval not displayed.   Cardiac Enzymes: No results for input(s): "CKTOTAL", "CKMB", "CKMBINDEX", "TROPONINI" in the last 168 hours. BNP: Invalid input(s): "POCBNP" CBG: No results for input(s): "GLUCAP" in the last 168 hours. D-Dimer No results for input(s): "DDIMER" in the last 72 hours.  Hgb A1c No results for input(s): "HGBA1C" in the last 72 hours. Lipid Profile No results for input(s): "CHOL", "HDL", "LDLCALC", "TRIG", "CHOLHDL", "LDLDIRECT" in the last 72 hours. Thyroid function studies No results for input(s): "TSH", "T4TOTAL", "T3FREE", "THYROIDAB" in the last 72 hours.  Invalid input(s): "FREET3" Anemia work up No results for input(s): "VITAMINB12", "FOLATE", "FERRITIN", "TIBC", "IRON",  "RETICCTPCT" in the last 72 hours. Urinalysis    Component Value Date/Time   COLORURINE YELLOW 12/12/2021 1854   APPEARANCEUR CLEAR 12/12/2021 1854   LABSPEC 1.014 12/12/2021 1854   PHURINE 6.0 12/12/2021 1854   GLUCOSEU NEGATIVE 12/12/2021 1854   HGBUR SMALL (A) 12/12/2021 1854   BILIRUBINUR NEGATIVE 12/12/2021 1854   KETONESUR NEGATIVE 12/12/2021 1854   PROTEINUR 30 (A) 12/12/2021 1854   UROBILINOGEN 0.2 04/07/2015 0339   NITRITE NEGATIVE 12/12/2021 1854   LEUKOCYTESUR NEGATIVE 12/12/2021 1854   Sepsis Labs Recent Labs  Lab 12/03/22 0704 12/04/22 0627 12/05/22 0450 12/06/22 0334  WBC 15.8* 14.4* 8.5 9.5   Microbiology Recent Results (from the past 240 hour(s))  Resp panel by RT-PCR (RSV, Flu A&B, Covid) Anterior Nasal Swab     Status: None   Collection Time: 12/02/22 11:06 PM   Specimen: Anterior Nasal Swab  Result Value Ref Range Status   SARS Coronavirus 2 by RT PCR NEGATIVE NEGATIVE Final    Comment: (NOTE) SARS-CoV-2 target nucleic acids are NOT DETECTED.  The SARS-CoV-2 RNA is generally detectable in upper respiratory specimens during the acute phase of infection. The lowest concentration of SARS-CoV-2 viral copies this assay can detect is 138 copies/mL. A negative result does not preclude SARS-Cov-2 infection and should not be used as the sole basis for treatment or other patient management decisions. A negative result may occur with  improper specimen collection/handling, submission of specimen other than nasopharyngeal swab, presence of viral mutation(s) within the areas targeted by this assay, and inadequate number of viral copies(<138 copies/mL). A negative result must be combined with clinical observations, patient history, and epidemiological information. The expected result is Negative.  Fact Sheet for Patients:  BloggerCourse.com  Fact Sheet for Healthcare Providers:  SeriousBroker.it  This test is  no t yet approved or cleared by the [place] FDA and  has been authorized for detection and/or diagnosis of SARS-CoV-2 by FDA under an Emergency Use Authorization (EUA). This EUA will remain  in effect (meaning this test can be used) for the duration of the COVID-19 declaration under Section 564(b)(1) of the Act, 21 U.S.C.section 360bbb-3(b)(1), unless the authorization is terminated  or revoked sooner.       Influenza A by PCR NEGATIVE NEGATIVE Final   Influenza B by PCR NEGATIVE NEGATIVE Final    Comment: (NOTE) The Xpert Xpress SARS-CoV-2/FLU/RSV plus assay is intended as an aid in the diagnosis of influenza from Nasopharyngeal swab specimens and should not be used as a sole basis for treatment. Nasal washings and aspirates are unacceptable for Xpert Xpress SARS-CoV-2/FLU/RSV testing.  Fact Sheet for Patients: BloggerCourse.com  Fact Sheet for Healthcare Providers: SeriousBroker.it  This test is not yet approved or cleared by the [place] FDA and has been authorized for detection and/or diagnosis of SARS-CoV-2 by FDA under an Emergency Use Authorization (EUA). This EUA will remain in effect (meaning this test can be used) for the duration of the COVID-19 declaration under Section 564(b)(1) of the Act, 21 U.S.C. section 360bbb-3(b)(1), unless the authorization is terminated or revoked.     Resp Syncytial Virus by PCR NEGATIVE NEGATIVE Final  Comment: (NOTE) Fact Sheet for Patients: BloggerCourse.com  Fact Sheet for Healthcare Providers: SeriousBroker.it  This test is not yet approved or cleared by the [place] FDA and has been authorized for detection and/or diagnosis of SARS-CoV-2 by FDA under an Emergency Use Authorization (EUA). This EUA will remain in effect (meaning this test can be used) for the duration of the COVID-19 declaration under Section  564(b)(1) of the Act, 21 U.S.C. section 360bbb-3(b)(1), unless the authorization is terminated or revoked.  Performed at [facility], [address] [place], [place] [address]   Respiratory (~20 pathogens) panel by PCR     Status: Abnormal   Collection Time: 12/02/22 11:06 PM   Specimen: Nasopharyngeal Swab; Respiratory  Result Value Ref Range Status   Adenovirus NOT DETECTED NOT DETECTED Final   Coronavirus 229E NOT DETECTED NOT DETECTED Final    Comment: (NOTE) The Coronavirus on the Respiratory Panel, DOES NOT test for the novel  Coronavirus (2019 nCoV)    Coronavirus HKU1 NOT DETECTED NOT DETECTED Final   Coronavirus NL63 NOT DETECTED NOT DETECTED Final   Coronavirus OC43 NOT DETECTED NOT DETECTED Final   Metapneumovirus NOT DETECTED NOT DETECTED Final   Rhinovirus / Enterovirus NOT DETECTED NOT DETECTED Final   Influenza A NOT DETECTED NOT DETECTED Final   Influenza B NOT DETECTED NOT DETECTED Final   Parainfluenza Virus 1 NOT DETECTED NOT DETECTED Final   Parainfluenza Virus 2 NOT DETECTED NOT DETECTED Final   Parainfluenza Virus 3 NOT DETECTED NOT DETECTED Final   Parainfluenza Virus 4 DETECTED (A) NOT DETECTED Final   Respiratory Syncytial Virus NOT DETECTED NOT DETECTED Final   Bordetella pertussis NOT DETECTED NOT DETECTED Final   Bordetella Parapertussis NOT DETECTED NOT DETECTED Final   Chlamydophila pneumoniae NOT DETECTED NOT DETECTED Final   Mycoplasma pneumoniae NOT DETECTED NOT DETECTED Final    Comment: Performed at [facility], [address] [place], [place] [address]  Blood culture (routine x 2)     Status: None (Preliminary result)   Collection Time: 12/03/22 12:15 AM   Specimen: BLOOD RIGHT FOREARM  Result Value Ref Range Status   Specimen Description BLOOD RIGHT FOREARM  Final   Special Requests   Final    BOTTLES DRAWN AEROBIC AND ANAEROBIC Blood Culture adequate volume   Culture   Final    NO GROWTH 2 DAYS Performed at [facility], [address] [place], [place] [address]    Report Status PENDING  Incomplete  Respiratory (~20 pathogens) panel by PCR     Status: Abnormal   Collection Time: 12/03/22 11:00 AM   Specimen: Nasopharyngeal Swab; Respiratory  Result Value Ref Range Status   Adenovirus NOT DETECTED NOT DETECTED Final   Coronavirus 229E NOT DETECTED NOT DETECTED Final    Comment: (NOTE) The Coronavirus on the Respiratory Panel, DOES NOT test for the novel  Coronavirus (2019 nCoV)    Coronavirus HKU1 NOT DETECTED NOT DETECTED Final   Coronavirus NL63 NOT DETECTED NOT DETECTED Final   Coronavirus OC43 NOT DETECTED NOT DETECTED Final   Metapneumovirus NOT DETECTED NOT DETECTED Final   Rhinovirus / Enterovirus NOT DETECTED NOT DETECTED Final   Influenza A NOT DETECTED NOT DETECTED Final   Influenza B NOT DETECTED NOT DETECTED Final   Parainfluenza Virus 1 NOT DETECTED NOT DETECTED Final   Parainfluenza Virus 2 NOT DETECTED NOT DETECTED Final   Parainfluenza Virus 3 NOT DETECTED NOT DETECTED Final   Parainfluenza Virus 4  DETECTED (A) NOT DETECTED Final   Respiratory Syncytial Virus NOT DETECTED NOT DETECTED Final   Bordetella pertussis NOT DETECTED NOT DETECTED Final   Bordetella Parapertussis NOT DETECTED NOT DETECTED Final   Chlamydophila pneumoniae NOT DETECTED NOT DETECTED Final   Mycoplasma pneumoniae NOT DETECTED NOT DETECTED Final    Comment: Performed at [facility], [address], [place], [place] [address]  Blood culture (routine x 2)     Status: None (Preliminary result)   Collection Time: 12/03/22  8:32 PM   Specimen: BLOOD LEFT ARM  Result Value Ref Range Status   Specimen Description BLOOD LEFT ARM  Final   Special Requests   Final    BOTTLES DRAWN AEROBIC ONLY Blood Culture adequate volume   Culture   Final    NO GROWTH 2 DAYS Performed at [facility], [address], [place], [place] [address]    Report Status PENDING  Incomplete  MRSA Next Gen by PCR, Nasal      Status: None   Collection Time: 12/04/22  5:50 PM   Specimen: Nasal Mucosa; Nasal Swab  Result Value Ref Range Status   MRSA by PCR Next Gen NOT DETECTED NOT DETECTED Final    Comment: (NOTE) The GeneXpert MRSA Assay (FDA approved for NASAL specimens only), is one component of a comprehensive MRSA colonization surveillance program. It is not intended to diagnose MRSA infection nor to guide or monitor treatment for MRSA infections. Test performance is not FDA approved in patients less than 58 years old. Performed at [facility], [address] [place], [place] [address]      Time coordinating discharge: 35 minutes  SIGNED:   Glade Lloyd, MD  [facility] 12/06/2022, 10:16 AM

## 2022-12-06 NOTE — Progress Notes (Signed)
Palliative-   Consult received for "goals of care" - chart reviewed.  Patient is discharging home today.  Recommend followup with primary care MD for goals of care discussion after discharge and possible referral to outpatient Palliative if needed.   [name], AGNP-C Palliative Medicine  No charge

## 2022-12-06 NOTE — Plan of Care (Signed)

## 2022-12-06 NOTE — Plan of Care (Signed)
  Problem: Education: Goal: Knowledge of General Education information will improve Description: Including pain rating scale, medication(s)/side effects and non-pharmacologic comfort measures Outcome: Progressing   Problem: Health Behavior/Discharge Planning: Goal: Ability to manage health-related needs will improve Outcome: Progressing   Problem: Elimination: Goal: Will not experience complications related to urinary retention Outcome: Progressing   Problem: Safety: Goal: Ability to remain free from injury will improve Outcome: Progressing   Problem: Skin Integrity: Goal: Risk for impaired skin integrity will decrease Outcome: Progressing

## 2022-12-07 DIAGNOSIS — I69318 Other symptoms and signs involving cognitive functions following cerebral infarction: Secondary | ICD-10-CM | POA: Diagnosis not present

## 2022-12-07 DIAGNOSIS — F0150 Vascular dementia without behavioral disturbance: Secondary | ICD-10-CM | POA: Diagnosis not present

## 2022-12-07 DIAGNOSIS — J4489 Other specified chronic obstructive pulmonary disease: Secondary | ICD-10-CM | POA: Diagnosis not present

## 2022-12-07 DIAGNOSIS — I69354 Hemiplegia and hemiparesis following cerebral infarction affecting left non-dominant side: Secondary | ICD-10-CM | POA: Diagnosis not present

## 2022-12-07 DIAGNOSIS — H9190 Unspecified hearing loss, unspecified ear: Secondary | ICD-10-CM | POA: Diagnosis not present

## 2022-12-07 DIAGNOSIS — I129 Hypertensive chronic kidney disease with stage 1 through stage 4 chronic kidney disease, or unspecified chronic kidney disease: Secondary | ICD-10-CM | POA: Diagnosis not present

## 2022-12-08 DIAGNOSIS — I480 Paroxysmal atrial fibrillation: Secondary | ICD-10-CM | POA: Diagnosis not present

## 2022-12-08 DIAGNOSIS — F32A Depression, unspecified: Secondary | ICD-10-CM | POA: Diagnosis not present

## 2022-12-08 DIAGNOSIS — F0150 Vascular dementia without behavioral disturbance: Secondary | ICD-10-CM | POA: Diagnosis not present

## 2022-12-08 DIAGNOSIS — J4489 Other specified chronic obstructive pulmonary disease: Secondary | ICD-10-CM | POA: Diagnosis not present

## 2022-12-08 DIAGNOSIS — M1712 Unilateral primary osteoarthritis, left knee: Secondary | ICD-10-CM | POA: Diagnosis not present

## 2022-12-08 DIAGNOSIS — I69318 Other symptoms and signs involving cognitive functions following cerebral infarction: Secondary | ICD-10-CM | POA: Diagnosis not present

## 2022-12-08 DIAGNOSIS — G5791 Unspecified mononeuropathy of right lower limb: Secondary | ICD-10-CM | POA: Diagnosis not present

## 2022-12-08 DIAGNOSIS — E559 Vitamin D deficiency, unspecified: Secondary | ICD-10-CM | POA: Diagnosis not present

## 2022-12-08 DIAGNOSIS — J440 Chronic obstructive pulmonary disease with acute lower respiratory infection: Secondary | ICD-10-CM | POA: Diagnosis not present

## 2022-12-08 DIAGNOSIS — Z85118 Personal history of other malignant neoplasm of bronchus and lung: Secondary | ICD-10-CM | POA: Diagnosis not present

## 2022-12-08 DIAGNOSIS — N179 Acute kidney failure, unspecified: Secondary | ICD-10-CM | POA: Diagnosis not present

## 2022-12-08 DIAGNOSIS — N184 Chronic kidney disease, stage 4 (severe): Secondary | ICD-10-CM | POA: Diagnosis not present

## 2022-12-08 DIAGNOSIS — H9190 Unspecified hearing loss, unspecified ear: Secondary | ICD-10-CM | POA: Diagnosis not present

## 2022-12-08 DIAGNOSIS — Z87891 Personal history of nicotine dependence: Secondary | ICD-10-CM | POA: Diagnosis not present

## 2022-12-08 DIAGNOSIS — Z556 Problems related to health literacy: Secondary | ICD-10-CM | POA: Diagnosis not present

## 2022-12-08 DIAGNOSIS — Z9981 Dependence on supplemental oxygen: Secondary | ICD-10-CM | POA: Diagnosis not present

## 2022-12-08 DIAGNOSIS — E039 Hypothyroidism, unspecified: Secondary | ICD-10-CM | POA: Diagnosis not present

## 2022-12-08 DIAGNOSIS — J122 Parainfluenza virus pneumonia: Secondary | ICD-10-CM | POA: Diagnosis not present

## 2022-12-08 DIAGNOSIS — I69354 Hemiplegia and hemiparesis following cerebral infarction affecting left non-dominant side: Secondary | ICD-10-CM | POA: Diagnosis not present

## 2022-12-08 DIAGNOSIS — R911 Solitary pulmonary nodule: Secondary | ICD-10-CM | POA: Diagnosis not present

## 2022-12-08 DIAGNOSIS — Z90710 Acquired absence of both cervix and uterus: Secondary | ICD-10-CM | POA: Diagnosis not present

## 2022-12-08 DIAGNOSIS — Z96651 Presence of right artificial knee joint: Secondary | ICD-10-CM | POA: Diagnosis not present

## 2022-12-08 DIAGNOSIS — E782 Mixed hyperlipidemia: Secondary | ICD-10-CM | POA: Diagnosis not present

## 2022-12-08 DIAGNOSIS — J9601 Acute respiratory failure with hypoxia: Secondary | ICD-10-CM | POA: Diagnosis not present

## 2022-12-08 DIAGNOSIS — I129 Hypertensive chronic kidney disease with stage 1 through stage 4 chronic kidney disease, or unspecified chronic kidney disease: Secondary | ICD-10-CM | POA: Diagnosis not present

## 2022-12-08 LAB — CULTURE, BLOOD (ROUTINE X 2)
Culture: NO GROWTH
Culture: NO GROWTH
Special Requests: ADEQUATE
Special Requests: ADEQUATE

## 2022-12-11 DIAGNOSIS — J122 Parainfluenza virus pneumonia: Secondary | ICD-10-CM | POA: Diagnosis not present

## 2022-12-11 DIAGNOSIS — J440 Chronic obstructive pulmonary disease with acute lower respiratory infection: Secondary | ICD-10-CM | POA: Diagnosis not present

## 2022-12-11 DIAGNOSIS — J9601 Acute respiratory failure with hypoxia: Secondary | ICD-10-CM | POA: Diagnosis not present

## 2022-12-11 DIAGNOSIS — I69318 Other symptoms and signs involving cognitive functions following cerebral infarction: Secondary | ICD-10-CM | POA: Diagnosis not present

## 2022-12-11 DIAGNOSIS — F0150 Vascular dementia without behavioral disturbance: Secondary | ICD-10-CM | POA: Diagnosis not present

## 2022-12-11 DIAGNOSIS — H9190 Unspecified hearing loss, unspecified ear: Secondary | ICD-10-CM | POA: Diagnosis not present

## 2022-12-12 DIAGNOSIS — J122 Parainfluenza virus pneumonia: Secondary | ICD-10-CM | POA: Diagnosis not present

## 2022-12-12 DIAGNOSIS — J440 Chronic obstructive pulmonary disease with acute lower respiratory infection: Secondary | ICD-10-CM | POA: Diagnosis not present

## 2022-12-12 DIAGNOSIS — F0150 Vascular dementia without behavioral disturbance: Secondary | ICD-10-CM | POA: Diagnosis not present

## 2022-12-12 DIAGNOSIS — I69318 Other symptoms and signs involving cognitive functions following cerebral infarction: Secondary | ICD-10-CM | POA: Diagnosis not present

## 2022-12-12 DIAGNOSIS — J9601 Acute respiratory failure with hypoxia: Secondary | ICD-10-CM | POA: Diagnosis not present

## 2022-12-12 DIAGNOSIS — H9190 Unspecified hearing loss, unspecified ear: Secondary | ICD-10-CM | POA: Diagnosis not present

## 2022-12-13 DIAGNOSIS — F0150 Vascular dementia without behavioral disturbance: Secondary | ICD-10-CM | POA: Diagnosis not present

## 2022-12-13 DIAGNOSIS — J9601 Acute respiratory failure with hypoxia: Secondary | ICD-10-CM | POA: Diagnosis not present

## 2022-12-13 DIAGNOSIS — J440 Chronic obstructive pulmonary disease with acute lower respiratory infection: Secondary | ICD-10-CM | POA: Diagnosis not present

## 2022-12-13 DIAGNOSIS — R5381 Other malaise: Secondary | ICD-10-CM | POA: Diagnosis not present

## 2022-12-13 DIAGNOSIS — F0390 Unspecified dementia without behavioral disturbance: Secondary | ICD-10-CM | POA: Diagnosis not present

## 2022-12-13 DIAGNOSIS — R911 Solitary pulmonary nodule: Secondary | ICD-10-CM | POA: Diagnosis not present

## 2022-12-13 DIAGNOSIS — J189 Pneumonia, unspecified organism: Secondary | ICD-10-CM | POA: Diagnosis not present

## 2022-12-13 DIAGNOSIS — I69318 Other symptoms and signs involving cognitive functions following cerebral infarction: Secondary | ICD-10-CM | POA: Diagnosis not present

## 2022-12-13 DIAGNOSIS — J122 Parainfluenza virus pneumonia: Secondary | ICD-10-CM | POA: Diagnosis not present

## 2022-12-13 DIAGNOSIS — N1831 Chronic kidney disease, stage 3a: Secondary | ICD-10-CM | POA: Diagnosis not present

## 2022-12-13 DIAGNOSIS — H9190 Unspecified hearing loss, unspecified ear: Secondary | ICD-10-CM | POA: Diagnosis not present

## 2022-12-16 ENCOUNTER — Ambulatory Visit: Payer: Medicare Other | Attending: Internal Medicine

## 2022-12-16 DIAGNOSIS — I639 Cerebral infarction, unspecified: Secondary | ICD-10-CM

## 2022-12-16 DIAGNOSIS — J440 Chronic obstructive pulmonary disease with acute lower respiratory infection: Secondary | ICD-10-CM | POA: Diagnosis not present

## 2022-12-16 DIAGNOSIS — F0150 Vascular dementia without behavioral disturbance: Secondary | ICD-10-CM | POA: Diagnosis not present

## 2022-12-16 DIAGNOSIS — I69318 Other symptoms and signs involving cognitive functions following cerebral infarction: Secondary | ICD-10-CM | POA: Diagnosis not present

## 2022-12-16 DIAGNOSIS — J122 Parainfluenza virus pneumonia: Secondary | ICD-10-CM | POA: Diagnosis not present

## 2022-12-16 DIAGNOSIS — H9190 Unspecified hearing loss, unspecified ear: Secondary | ICD-10-CM | POA: Diagnosis not present

## 2022-12-16 DIAGNOSIS — J9601 Acute respiratory failure with hypoxia: Secondary | ICD-10-CM | POA: Diagnosis not present

## 2022-12-16 NOTE — Progress Notes (Addendum)
Carelink Summary Report / Loop Recorder

## 2022-12-17 DIAGNOSIS — J440 Chronic obstructive pulmonary disease with acute lower respiratory infection: Secondary | ICD-10-CM | POA: Diagnosis not present

## 2022-12-17 DIAGNOSIS — I69318 Other symptoms and signs involving cognitive functions following cerebral infarction: Secondary | ICD-10-CM | POA: Diagnosis not present

## 2022-12-17 DIAGNOSIS — F0150 Vascular dementia without behavioral disturbance: Secondary | ICD-10-CM | POA: Diagnosis not present

## 2022-12-17 DIAGNOSIS — J9601 Acute respiratory failure with hypoxia: Secondary | ICD-10-CM | POA: Diagnosis not present

## 2022-12-17 DIAGNOSIS — H9190 Unspecified hearing loss, unspecified ear: Secondary | ICD-10-CM | POA: Diagnosis not present

## 2022-12-17 DIAGNOSIS — J122 Parainfluenza virus pneumonia: Secondary | ICD-10-CM | POA: Diagnosis not present

## 2022-12-17 LAB — CUP PACEART REMOTE DEVICE CHECK
Date Time Interrogation Session: 20240119230417
Implantable Pulse Generator Implant Date: 20210719

## 2022-12-18 DIAGNOSIS — I69318 Other symptoms and signs involving cognitive functions following cerebral infarction: Secondary | ICD-10-CM | POA: Diagnosis not present

## 2022-12-18 DIAGNOSIS — J9601 Acute respiratory failure with hypoxia: Secondary | ICD-10-CM | POA: Diagnosis not present

## 2022-12-18 DIAGNOSIS — F0150 Vascular dementia without behavioral disturbance: Secondary | ICD-10-CM | POA: Diagnosis not present

## 2022-12-18 DIAGNOSIS — J122 Parainfluenza virus pneumonia: Secondary | ICD-10-CM | POA: Diagnosis not present

## 2022-12-18 DIAGNOSIS — H9190 Unspecified hearing loss, unspecified ear: Secondary | ICD-10-CM | POA: Diagnosis not present

## 2022-12-18 DIAGNOSIS — J440 Chronic obstructive pulmonary disease with acute lower respiratory infection: Secondary | ICD-10-CM | POA: Diagnosis not present

## 2022-12-19 ENCOUNTER — Inpatient Hospital Stay: Payer: Medicare Other | Admitting: Emergency Medicine

## 2022-12-19 DIAGNOSIS — F0150 Vascular dementia without behavioral disturbance: Secondary | ICD-10-CM | POA: Diagnosis not present

## 2022-12-19 DIAGNOSIS — I69318 Other symptoms and signs involving cognitive functions following cerebral infarction: Secondary | ICD-10-CM | POA: Diagnosis not present

## 2022-12-19 DIAGNOSIS — J9601 Acute respiratory failure with hypoxia: Secondary | ICD-10-CM | POA: Diagnosis not present

## 2022-12-19 DIAGNOSIS — J122 Parainfluenza virus pneumonia: Secondary | ICD-10-CM | POA: Diagnosis not present

## 2022-12-19 DIAGNOSIS — H9190 Unspecified hearing loss, unspecified ear: Secondary | ICD-10-CM | POA: Diagnosis not present

## 2022-12-19 DIAGNOSIS — J440 Chronic obstructive pulmonary disease with acute lower respiratory infection: Secondary | ICD-10-CM | POA: Diagnosis not present

## 2022-12-20 ENCOUNTER — Telehealth: Payer: Self-pay

## 2022-12-20 DIAGNOSIS — J9601 Acute respiratory failure with hypoxia: Secondary | ICD-10-CM | POA: Diagnosis not present

## 2022-12-20 DIAGNOSIS — H9190 Unspecified hearing loss, unspecified ear: Secondary | ICD-10-CM | POA: Diagnosis not present

## 2022-12-20 DIAGNOSIS — F0150 Vascular dementia without behavioral disturbance: Secondary | ICD-10-CM | POA: Diagnosis not present

## 2022-12-20 DIAGNOSIS — I69318 Other symptoms and signs involving cognitive functions following cerebral infarction: Secondary | ICD-10-CM | POA: Diagnosis not present

## 2022-12-20 DIAGNOSIS — J440 Chronic obstructive pulmonary disease with acute lower respiratory infection: Secondary | ICD-10-CM | POA: Diagnosis not present

## 2022-12-20 DIAGNOSIS — J122 Parainfluenza virus pneumonia: Secondary | ICD-10-CM | POA: Diagnosis not present

## 2022-12-20 NOTE — Telephone Encounter (Signed)
1159 Palliative Care Note  RN attempted to contact pt to schedule initial PC visit. No answer. Left voicemail with contact info and request for return call.  Halford Chessman

## 2022-12-23 DIAGNOSIS — F0150 Vascular dementia without behavioral disturbance: Secondary | ICD-10-CM | POA: Diagnosis not present

## 2022-12-23 DIAGNOSIS — J440 Chronic obstructive pulmonary disease with acute lower respiratory infection: Secondary | ICD-10-CM | POA: Diagnosis not present

## 2022-12-23 DIAGNOSIS — H9190 Unspecified hearing loss, unspecified ear: Secondary | ICD-10-CM | POA: Diagnosis not present

## 2022-12-23 DIAGNOSIS — I69318 Other symptoms and signs involving cognitive functions following cerebral infarction: Secondary | ICD-10-CM | POA: Diagnosis not present

## 2022-12-23 DIAGNOSIS — J9601 Acute respiratory failure with hypoxia: Secondary | ICD-10-CM | POA: Diagnosis not present

## 2022-12-23 DIAGNOSIS — J122 Parainfluenza virus pneumonia: Secondary | ICD-10-CM | POA: Diagnosis not present

## 2022-12-24 ENCOUNTER — Other Ambulatory Visit: Payer: Medicare Other

## 2022-12-24 DIAGNOSIS — J122 Parainfluenza virus pneumonia: Secondary | ICD-10-CM | POA: Diagnosis not present

## 2022-12-24 DIAGNOSIS — I69318 Other symptoms and signs involving cognitive functions following cerebral infarction: Secondary | ICD-10-CM | POA: Diagnosis not present

## 2022-12-24 DIAGNOSIS — J440 Chronic obstructive pulmonary disease with acute lower respiratory infection: Secondary | ICD-10-CM | POA: Diagnosis not present

## 2022-12-24 DIAGNOSIS — J9601 Acute respiratory failure with hypoxia: Secondary | ICD-10-CM | POA: Diagnosis not present

## 2022-12-24 DIAGNOSIS — H9190 Unspecified hearing loss, unspecified ear: Secondary | ICD-10-CM | POA: Diagnosis not present

## 2022-12-24 DIAGNOSIS — F0150 Vascular dementia without behavioral disturbance: Secondary | ICD-10-CM | POA: Diagnosis not present

## 2022-12-24 DIAGNOSIS — Z515 Encounter for palliative care: Secondary | ICD-10-CM

## 2022-12-25 DIAGNOSIS — I69318 Other symptoms and signs involving cognitive functions following cerebral infarction: Secondary | ICD-10-CM | POA: Diagnosis not present

## 2022-12-25 DIAGNOSIS — F0150 Vascular dementia without behavioral disturbance: Secondary | ICD-10-CM | POA: Diagnosis not present

## 2022-12-25 DIAGNOSIS — J122 Parainfluenza virus pneumonia: Secondary | ICD-10-CM | POA: Diagnosis not present

## 2022-12-25 DIAGNOSIS — J440 Chronic obstructive pulmonary disease with acute lower respiratory infection: Secondary | ICD-10-CM | POA: Diagnosis not present

## 2022-12-25 DIAGNOSIS — J9601 Acute respiratory failure with hypoxia: Secondary | ICD-10-CM | POA: Diagnosis not present

## 2022-12-25 DIAGNOSIS — H9190 Unspecified hearing loss, unspecified ear: Secondary | ICD-10-CM | POA: Diagnosis not present

## 2022-12-25 NOTE — Progress Notes (Signed)
COMMUNITY PALLIATIVE CARE SW NOTE  PATIENT NAME: Valerie Young DOB: Jun 28, 1929 MRN: [id number]  PRIMARY CARE PROVIDER: Lois Huxley, PA  RESPONSIBLE PARTY:  Acct ID - Guarantor Home Phone Work Phone Relationship Acct Type  [id number] Marikay [phone]  Self P/F     [address], [place], [place] [address]   Clinical Social Work Initial Palliative Care Encounter  PC SW completed a telephonic encounter with patient's daughter-April. SW provided education to her regarding the palliative care program, which she provided verbal consent to services for her mother. She also provided a brief status update on patient. April advises that patient is doing pretty well. She has increased strength since the pneumonia. Her intake has improved a she will eat foods she likes. She ambulates with at walker. She is currently receiving PT, OT and RN through [facility] 1-2x week. She has not had any falls. She is on o2 at 2L continuous and her saturations have consistently remain above the 93%. Patient lives independently with daily contact. Her daughter lives in [place]. Patient has a doctor. Patient has helper through [employer] 1x week for three hours that assist with housekeeping duties. The daughter advised that patient applied for medicaid back in November, but has not heard anything back yet. SW advised that it usually takes 45-60 days, but would follow-up.  April verbalized no immediate needs.  *Next scheduled a face-to face visit with patient for 01/09/23 @ 1:00 pm.     Social History   Tobacco Use   Smoking status: Former    Packs/day: 1.00    Years: 35.00    Total pack years: 35.00    Types: Cigarettes    Quit date: 11/25/1980    Years since quitting: 42.1   Smokeless tobacco: Never  Substance Use Topics   Alcohol use: No    CODE STATUS: Full Code ADVANCED DIRECTIVES:  MOST FORM COMPLETE:  No HOSPICE EDUCATION PROVIDED: No  Duration of encounter and  documentation: 30 minutes  [employer], LCSW

## 2022-12-26 DIAGNOSIS — J440 Chronic obstructive pulmonary disease with acute lower respiratory infection: Secondary | ICD-10-CM | POA: Diagnosis not present

## 2022-12-26 DIAGNOSIS — I69318 Other symptoms and signs involving cognitive functions following cerebral infarction: Secondary | ICD-10-CM | POA: Diagnosis not present

## 2022-12-26 DIAGNOSIS — H9190 Unspecified hearing loss, unspecified ear: Secondary | ICD-10-CM | POA: Diagnosis not present

## 2022-12-26 DIAGNOSIS — J122 Parainfluenza virus pneumonia: Secondary | ICD-10-CM | POA: Diagnosis not present

## 2022-12-26 DIAGNOSIS — F0150 Vascular dementia without behavioral disturbance: Secondary | ICD-10-CM | POA: Diagnosis not present

## 2022-12-26 DIAGNOSIS — J9601 Acute respiratory failure with hypoxia: Secondary | ICD-10-CM | POA: Diagnosis not present

## 2022-12-27 DIAGNOSIS — H9190 Unspecified hearing loss, unspecified ear: Secondary | ICD-10-CM | POA: Diagnosis not present

## 2022-12-27 DIAGNOSIS — J440 Chronic obstructive pulmonary disease with acute lower respiratory infection: Secondary | ICD-10-CM | POA: Diagnosis not present

## 2022-12-27 DIAGNOSIS — I69318 Other symptoms and signs involving cognitive functions following cerebral infarction: Secondary | ICD-10-CM | POA: Diagnosis not present

## 2022-12-27 DIAGNOSIS — J122 Parainfluenza virus pneumonia: Secondary | ICD-10-CM | POA: Diagnosis not present

## 2022-12-27 DIAGNOSIS — J9601 Acute respiratory failure with hypoxia: Secondary | ICD-10-CM | POA: Diagnosis not present

## 2022-12-27 DIAGNOSIS — F0150 Vascular dementia without behavioral disturbance: Secondary | ICD-10-CM | POA: Diagnosis not present

## 2022-12-30 ENCOUNTER — Inpatient Hospital Stay: Payer: Medicare Other | Admitting: Nurse Practitioner

## 2022-12-30 DIAGNOSIS — H9190 Unspecified hearing loss, unspecified ear: Secondary | ICD-10-CM | POA: Diagnosis not present

## 2022-12-30 DIAGNOSIS — J122 Parainfluenza virus pneumonia: Secondary | ICD-10-CM | POA: Diagnosis not present

## 2022-12-30 DIAGNOSIS — J9601 Acute respiratory failure with hypoxia: Secondary | ICD-10-CM | POA: Diagnosis not present

## 2022-12-30 DIAGNOSIS — F0150 Vascular dementia without behavioral disturbance: Secondary | ICD-10-CM | POA: Diagnosis not present

## 2022-12-30 DIAGNOSIS — J440 Chronic obstructive pulmonary disease with acute lower respiratory infection: Secondary | ICD-10-CM | POA: Diagnosis not present

## 2022-12-30 DIAGNOSIS — I69318 Other symptoms and signs involving cognitive functions following cerebral infarction: Secondary | ICD-10-CM | POA: Diagnosis not present

## 2023-01-02 DIAGNOSIS — J9601 Acute respiratory failure with hypoxia: Secondary | ICD-10-CM | POA: Diagnosis not present

## 2023-01-02 DIAGNOSIS — F0150 Vascular dementia without behavioral disturbance: Secondary | ICD-10-CM | POA: Diagnosis not present

## 2023-01-02 DIAGNOSIS — H9190 Unspecified hearing loss, unspecified ear: Secondary | ICD-10-CM | POA: Diagnosis not present

## 2023-01-02 DIAGNOSIS — J440 Chronic obstructive pulmonary disease with acute lower respiratory infection: Secondary | ICD-10-CM | POA: Diagnosis not present

## 2023-01-02 DIAGNOSIS — I69318 Other symptoms and signs involving cognitive functions following cerebral infarction: Secondary | ICD-10-CM | POA: Diagnosis not present

## 2023-01-02 DIAGNOSIS — J122 Parainfluenza virus pneumonia: Secondary | ICD-10-CM | POA: Diagnosis not present

## 2023-01-03 DIAGNOSIS — J122 Parainfluenza virus pneumonia: Secondary | ICD-10-CM | POA: Diagnosis not present

## 2023-01-03 DIAGNOSIS — H9190 Unspecified hearing loss, unspecified ear: Secondary | ICD-10-CM | POA: Diagnosis not present

## 2023-01-03 DIAGNOSIS — J9601 Acute respiratory failure with hypoxia: Secondary | ICD-10-CM | POA: Diagnosis not present

## 2023-01-03 DIAGNOSIS — I69318 Other symptoms and signs involving cognitive functions following cerebral infarction: Secondary | ICD-10-CM | POA: Diagnosis not present

## 2023-01-03 DIAGNOSIS — J440 Chronic obstructive pulmonary disease with acute lower respiratory infection: Secondary | ICD-10-CM | POA: Diagnosis not present

## 2023-01-03 DIAGNOSIS — F0150 Vascular dementia without behavioral disturbance: Secondary | ICD-10-CM | POA: Diagnosis not present

## 2023-01-07 DIAGNOSIS — I69398 Other sequelae of cerebral infarction: Secondary | ICD-10-CM | POA: Diagnosis not present

## 2023-01-07 DIAGNOSIS — H547 Unspecified visual loss: Secondary | ICD-10-CM | POA: Diagnosis not present

## 2023-01-07 DIAGNOSIS — Z87891 Personal history of nicotine dependence: Secondary | ICD-10-CM | POA: Diagnosis not present

## 2023-01-07 DIAGNOSIS — Z85118 Personal history of other malignant neoplasm of bronchus and lung: Secondary | ICD-10-CM | POA: Diagnosis not present

## 2023-01-07 DIAGNOSIS — E782 Mixed hyperlipidemia: Secondary | ICD-10-CM | POA: Diagnosis not present

## 2023-01-07 DIAGNOSIS — J440 Chronic obstructive pulmonary disease with acute lower respiratory infection: Secondary | ICD-10-CM | POA: Diagnosis not present

## 2023-01-07 DIAGNOSIS — F0150 Vascular dementia without behavioral disturbance: Secondary | ICD-10-CM | POA: Diagnosis not present

## 2023-01-07 DIAGNOSIS — R911 Solitary pulmonary nodule: Secondary | ICD-10-CM | POA: Diagnosis not present

## 2023-01-07 DIAGNOSIS — E559 Vitamin D deficiency, unspecified: Secondary | ICD-10-CM | POA: Diagnosis not present

## 2023-01-07 DIAGNOSIS — N184 Chronic kidney disease, stage 4 (severe): Secondary | ICD-10-CM | POA: Diagnosis not present

## 2023-01-07 DIAGNOSIS — M1712 Unilateral primary osteoarthritis, left knee: Secondary | ICD-10-CM | POA: Diagnosis not present

## 2023-01-07 DIAGNOSIS — H9190 Unspecified hearing loss, unspecified ear: Secondary | ICD-10-CM | POA: Diagnosis not present

## 2023-01-07 DIAGNOSIS — Z90710 Acquired absence of both cervix and uterus: Secondary | ICD-10-CM | POA: Diagnosis not present

## 2023-01-07 DIAGNOSIS — I69354 Hemiplegia and hemiparesis following cerebral infarction affecting left non-dominant side: Secondary | ICD-10-CM | POA: Diagnosis not present

## 2023-01-07 DIAGNOSIS — E039 Hypothyroidism, unspecified: Secondary | ICD-10-CM | POA: Diagnosis not present

## 2023-01-07 DIAGNOSIS — J122 Parainfluenza virus pneumonia: Secondary | ICD-10-CM | POA: Diagnosis not present

## 2023-01-07 DIAGNOSIS — N179 Acute kidney failure, unspecified: Secondary | ICD-10-CM | POA: Diagnosis not present

## 2023-01-07 DIAGNOSIS — Z96651 Presence of right artificial knee joint: Secondary | ICD-10-CM | POA: Diagnosis not present

## 2023-01-07 DIAGNOSIS — Z9981 Dependence on supplemental oxygen: Secondary | ICD-10-CM | POA: Diagnosis not present

## 2023-01-07 DIAGNOSIS — G5791 Unspecified mononeuropathy of right lower limb: Secondary | ICD-10-CM | POA: Diagnosis not present

## 2023-01-07 DIAGNOSIS — I69318 Other symptoms and signs involving cognitive functions following cerebral infarction: Secondary | ICD-10-CM | POA: Diagnosis not present

## 2023-01-07 DIAGNOSIS — I129 Hypertensive chronic kidney disease with stage 1 through stage 4 chronic kidney disease, or unspecified chronic kidney disease: Secondary | ICD-10-CM | POA: Diagnosis not present

## 2023-01-07 DIAGNOSIS — I480 Paroxysmal atrial fibrillation: Secondary | ICD-10-CM | POA: Diagnosis not present

## 2023-01-07 DIAGNOSIS — J9601 Acute respiratory failure with hypoxia: Secondary | ICD-10-CM | POA: Diagnosis not present

## 2023-01-07 DIAGNOSIS — F32A Depression, unspecified: Secondary | ICD-10-CM | POA: Diagnosis not present

## 2023-01-09 ENCOUNTER — Other Ambulatory Visit: Payer: Medicare Other

## 2023-01-09 VITALS — BP 92/52 | HR 64 | Temp 97.0°F

## 2023-01-09 DIAGNOSIS — F0150 Vascular dementia without behavioral disturbance: Secondary | ICD-10-CM | POA: Diagnosis not present

## 2023-01-09 DIAGNOSIS — J9601 Acute respiratory failure with hypoxia: Secondary | ICD-10-CM | POA: Diagnosis not present

## 2023-01-09 DIAGNOSIS — I69354 Hemiplegia and hemiparesis following cerebral infarction affecting left non-dominant side: Secondary | ICD-10-CM | POA: Diagnosis not present

## 2023-01-09 DIAGNOSIS — Z515 Encounter for palliative care: Secondary | ICD-10-CM

## 2023-01-09 DIAGNOSIS — J122 Parainfluenza virus pneumonia: Secondary | ICD-10-CM | POA: Diagnosis not present

## 2023-01-09 DIAGNOSIS — I69318 Other symptoms and signs involving cognitive functions following cerebral infarction: Secondary | ICD-10-CM | POA: Diagnosis not present

## 2023-01-09 DIAGNOSIS — J440 Chronic obstructive pulmonary disease with acute lower respiratory infection: Secondary | ICD-10-CM | POA: Diagnosis not present

## 2023-01-09 NOTE — Progress Notes (Signed)
PATIENT NAME: Valerie Young DOB: [birth year] MRN: [id number]  PRIMARY CARE PROVIDER: Lois Huxley, PA  RESPONSIBLE PARTY:  Acct ID - Guarantor Home Phone Work Phone Relationship Acct Type  [id number] Marikay [id number]  Self P/F     [address], [place], [place] [address]   Palliative Care Initial Encounter Note   LPN completed visit.       GI/GU: has a BM daily; continent of urine but wears a pad for protection; denies issues at this time  Cognitive: alert and oriented   Appetite: eating breakfast ([employer] potato, egg and sausage bowl) and giving small pieces to her dog; eats 2 meals with infrequent snacks; drinks 24oz Gatorade daily and 16oz Boost daily   Mobility: walked from her apt to the front door with rolling walker; walks slow but no other issues; [facility] PT comes to home  Respiratory: not using continuous O2 at this time only uses supplemental O2 @  2L at bedtime; pt makes auditory moans while eating  Sleeping Pattern: sleeps all night except the past 2 nights she's been wide awake; does not want to   Pain: reports she doesn't have any at this time  Palliative Care/ Hospice: LPN explained role and purpose of palliative care including visit frequency. Also discussed benefits of hospice care as well as the differences between the two with patient.   Goals of Care: To stay in her apt with her dog for as long as possible  CODE STATUS: Full Code ADVANCED DIRECTIVES: N MOST FORM: N PPS: 50%   PHYSICAL EXAM:   VITALS: Today's Vitals   01/09/23 1319  BP: (!) 92/52  Pulse: 64  Temp: (!) 97 F (36.1 C)  TempSrc: Temporal  SpO2: 94%  PainSc: 0-No pain    LUNGS: clear to auscultation  CARDIAC: Cor RRR EXTREMITIES: no edema noted SKIN: normal  NEURO: negative       Cybele Maule Georgann Housekeeper, LPN

## 2023-01-10 DIAGNOSIS — J440 Chronic obstructive pulmonary disease with acute lower respiratory infection: Secondary | ICD-10-CM | POA: Diagnosis not present

## 2023-01-10 DIAGNOSIS — J122 Parainfluenza virus pneumonia: Secondary | ICD-10-CM | POA: Diagnosis not present

## 2023-01-10 DIAGNOSIS — I69354 Hemiplegia and hemiparesis following cerebral infarction affecting left non-dominant side: Secondary | ICD-10-CM | POA: Diagnosis not present

## 2023-01-10 DIAGNOSIS — J9601 Acute respiratory failure with hypoxia: Secondary | ICD-10-CM | POA: Diagnosis not present

## 2023-01-10 DIAGNOSIS — I69318 Other symptoms and signs involving cognitive functions following cerebral infarction: Secondary | ICD-10-CM | POA: Diagnosis not present

## 2023-01-10 DIAGNOSIS — F0150 Vascular dementia without behavioral disturbance: Secondary | ICD-10-CM | POA: Diagnosis not present

## 2023-01-11 DIAGNOSIS — K5790 Diverticulosis of intestine, part unspecified, without perforation or abscess without bleeding: Secondary | ICD-10-CM | POA: Diagnosis not present

## 2023-01-11 DIAGNOSIS — Z85118 Personal history of other malignant neoplasm of bronchus and lung: Secondary | ICD-10-CM | POA: Diagnosis not present

## 2023-01-11 DIAGNOSIS — G9340 Encephalopathy, unspecified: Secondary | ICD-10-CM | POA: Diagnosis not present

## 2023-01-11 DIAGNOSIS — G5792 Unspecified mononeuropathy of left lower limb: Secondary | ICD-10-CM | POA: Diagnosis not present

## 2023-01-11 DIAGNOSIS — I69354 Hemiplegia and hemiparesis following cerebral infarction affecting left non-dominant side: Secondary | ICD-10-CM | POA: Diagnosis not present

## 2023-01-11 DIAGNOSIS — R5381 Other malaise: Secondary | ICD-10-CM | POA: Diagnosis not present

## 2023-01-11 DIAGNOSIS — E790 Hyperuricemia without signs of inflammatory arthritis and tophaceous disease: Secondary | ICD-10-CM | POA: Diagnosis not present

## 2023-01-11 DIAGNOSIS — I679 Cerebrovascular disease, unspecified: Secondary | ICD-10-CM | POA: Diagnosis not present

## 2023-01-11 DIAGNOSIS — R451 Restlessness and agitation: Secondary | ICD-10-CM | POA: Diagnosis not present

## 2023-01-11 DIAGNOSIS — I69398 Other sequelae of cerebral infarction: Secondary | ICD-10-CM | POA: Diagnosis not present

## 2023-01-11 DIAGNOSIS — F0390 Unspecified dementia without behavioral disturbance: Secondary | ICD-10-CM | POA: Diagnosis not present

## 2023-01-11 DIAGNOSIS — J310 Chronic rhinitis: Secondary | ICD-10-CM | POA: Diagnosis not present

## 2023-01-11 DIAGNOSIS — N1832 Chronic kidney disease, stage 3b: Secondary | ICD-10-CM | POA: Diagnosis not present

## 2023-01-11 DIAGNOSIS — M179 Osteoarthritis of knee, unspecified: Secondary | ICD-10-CM | POA: Diagnosis not present

## 2023-01-11 DIAGNOSIS — J449 Chronic obstructive pulmonary disease, unspecified: Secondary | ICD-10-CM | POA: Diagnosis not present

## 2023-01-11 DIAGNOSIS — C3490 Malignant neoplasm of unspecified part of unspecified bronchus or lung: Secondary | ICD-10-CM | POA: Diagnosis not present

## 2023-01-11 DIAGNOSIS — F329 Major depressive disorder, single episode, unspecified: Secondary | ICD-10-CM | POA: Diagnosis not present

## 2023-01-11 DIAGNOSIS — Z9181 History of falling: Secondary | ICD-10-CM | POA: Diagnosis not present

## 2023-01-11 DIAGNOSIS — M153 Secondary multiple arthritis: Secondary | ICD-10-CM | POA: Diagnosis not present

## 2023-01-11 DIAGNOSIS — E782 Mixed hyperlipidemia: Secondary | ICD-10-CM | POA: Diagnosis not present

## 2023-01-11 DIAGNOSIS — G5791 Unspecified mononeuropathy of right lower limb: Secondary | ICD-10-CM | POA: Diagnosis not present

## 2023-01-11 DIAGNOSIS — E559 Vitamin D deficiency, unspecified: Secondary | ICD-10-CM | POA: Diagnosis not present

## 2023-01-11 DIAGNOSIS — I129 Hypertensive chronic kidney disease with stage 1 through stage 4 chronic kidney disease, or unspecified chronic kidney disease: Secondary | ICD-10-CM | POA: Diagnosis not present

## 2023-01-11 DIAGNOSIS — I69319 Unspecified symptoms and signs involving cognitive functions following cerebral infarction: Secondary | ICD-10-CM | POA: Diagnosis not present

## 2023-01-13 DIAGNOSIS — I69398 Other sequelae of cerebral infarction: Secondary | ICD-10-CM | POA: Diagnosis not present

## 2023-01-13 DIAGNOSIS — I69319 Unspecified symptoms and signs involving cognitive functions following cerebral infarction: Secondary | ICD-10-CM | POA: Diagnosis not present

## 2023-01-13 DIAGNOSIS — J449 Chronic obstructive pulmonary disease, unspecified: Secondary | ICD-10-CM | POA: Diagnosis not present

## 2023-01-13 DIAGNOSIS — F0390 Unspecified dementia without behavioral disturbance: Secondary | ICD-10-CM | POA: Diagnosis not present

## 2023-01-13 DIAGNOSIS — C3490 Malignant neoplasm of unspecified part of unspecified bronchus or lung: Secondary | ICD-10-CM | POA: Diagnosis not present

## 2023-01-13 DIAGNOSIS — I69354 Hemiplegia and hemiparesis following cerebral infarction affecting left non-dominant side: Secondary | ICD-10-CM | POA: Diagnosis not present

## 2023-01-14 ENCOUNTER — Telehealth: Payer: Self-pay

## 2023-01-14 NOTE — Telephone Encounter (Signed)
(  3:47 pm) PC SW received a call from patient's son-Billy. He requested that patient be discharged from [facility] palliative care services as she has started hospice services through [facility].  Patient discharged from [facility] services effective today 01/14/23.

## 2023-01-17 LAB — CUP PACEART REMOTE DEVICE CHECK
Date Time Interrogation Session: 20240221230935
Implantable Pulse Generator Implant Date: 20210719

## 2023-01-20 ENCOUNTER — Ambulatory Visit: Payer: Medicare Other

## 2023-01-20 DIAGNOSIS — I639 Cerebral infarction, unspecified: Secondary | ICD-10-CM

## 2023-01-22 DIAGNOSIS — F0390 Unspecified dementia without behavioral disturbance: Secondary | ICD-10-CM | POA: Diagnosis not present

## 2023-01-22 DIAGNOSIS — C3490 Malignant neoplasm of unspecified part of unspecified bronchus or lung: Secondary | ICD-10-CM | POA: Diagnosis not present

## 2023-01-22 DIAGNOSIS — I69354 Hemiplegia and hemiparesis following cerebral infarction affecting left non-dominant side: Secondary | ICD-10-CM | POA: Diagnosis not present

## 2023-01-22 DIAGNOSIS — J449 Chronic obstructive pulmonary disease, unspecified: Secondary | ICD-10-CM | POA: Diagnosis not present

## 2023-01-22 DIAGNOSIS — I69319 Unspecified symptoms and signs involving cognitive functions following cerebral infarction: Secondary | ICD-10-CM | POA: Diagnosis not present

## 2023-01-22 DIAGNOSIS — I69398 Other sequelae of cerebral infarction: Secondary | ICD-10-CM | POA: Diagnosis not present

## 2023-01-23 DIAGNOSIS — J449 Chronic obstructive pulmonary disease, unspecified: Secondary | ICD-10-CM | POA: Diagnosis not present

## 2023-01-23 DIAGNOSIS — I69319 Unspecified symptoms and signs involving cognitive functions following cerebral infarction: Secondary | ICD-10-CM | POA: Diagnosis not present

## 2023-01-23 DIAGNOSIS — C3490 Malignant neoplasm of unspecified part of unspecified bronchus or lung: Secondary | ICD-10-CM | POA: Diagnosis not present

## 2023-01-23 DIAGNOSIS — I69398 Other sequelae of cerebral infarction: Secondary | ICD-10-CM | POA: Diagnosis not present

## 2023-01-23 DIAGNOSIS — F0390 Unspecified dementia without behavioral disturbance: Secondary | ICD-10-CM | POA: Diagnosis not present

## 2023-01-23 DIAGNOSIS — I69354 Hemiplegia and hemiparesis following cerebral infarction affecting left non-dominant side: Secondary | ICD-10-CM | POA: Diagnosis not present

## 2023-01-24 DIAGNOSIS — E790 Hyperuricemia without signs of inflammatory arthritis and tophaceous disease: Secondary | ICD-10-CM | POA: Diagnosis not present

## 2023-01-24 DIAGNOSIS — R451 Restlessness and agitation: Secondary | ICD-10-CM | POA: Diagnosis not present

## 2023-01-24 DIAGNOSIS — M153 Secondary multiple arthritis: Secondary | ICD-10-CM | POA: Diagnosis not present

## 2023-01-24 DIAGNOSIS — R5381 Other malaise: Secondary | ICD-10-CM | POA: Diagnosis not present

## 2023-01-24 DIAGNOSIS — G5791 Unspecified mononeuropathy of right lower limb: Secondary | ICD-10-CM | POA: Diagnosis not present

## 2023-01-24 DIAGNOSIS — K5790 Diverticulosis of intestine, part unspecified, without perforation or abscess without bleeding: Secondary | ICD-10-CM | POA: Diagnosis not present

## 2023-01-24 DIAGNOSIS — G5792 Unspecified mononeuropathy of left lower limb: Secondary | ICD-10-CM | POA: Diagnosis not present

## 2023-01-24 DIAGNOSIS — M179 Osteoarthritis of knee, unspecified: Secondary | ICD-10-CM | POA: Diagnosis not present

## 2023-01-24 DIAGNOSIS — F0390 Unspecified dementia without behavioral disturbance: Secondary | ICD-10-CM | POA: Diagnosis not present

## 2023-01-24 DIAGNOSIS — J449 Chronic obstructive pulmonary disease, unspecified: Secondary | ICD-10-CM | POA: Diagnosis not present

## 2023-01-24 DIAGNOSIS — G9340 Encephalopathy, unspecified: Secondary | ICD-10-CM | POA: Diagnosis not present

## 2023-01-24 DIAGNOSIS — I69319 Unspecified symptoms and signs involving cognitive functions following cerebral infarction: Secondary | ICD-10-CM | POA: Diagnosis not present

## 2023-01-24 DIAGNOSIS — Z9181 History of falling: Secondary | ICD-10-CM | POA: Diagnosis not present

## 2023-01-24 DIAGNOSIS — I69354 Hemiplegia and hemiparesis following cerebral infarction affecting left non-dominant side: Secondary | ICD-10-CM | POA: Diagnosis not present

## 2023-01-24 DIAGNOSIS — F329 Major depressive disorder, single episode, unspecified: Secondary | ICD-10-CM | POA: Diagnosis not present

## 2023-01-24 DIAGNOSIS — N1832 Chronic kidney disease, stage 3b: Secondary | ICD-10-CM | POA: Diagnosis not present

## 2023-01-24 DIAGNOSIS — E559 Vitamin D deficiency, unspecified: Secondary | ICD-10-CM | POA: Diagnosis not present

## 2023-01-24 DIAGNOSIS — Z902 Acquired absence of lung [part of]: Secondary | ICD-10-CM | POA: Diagnosis not present

## 2023-01-24 DIAGNOSIS — I679 Cerebrovascular disease, unspecified: Secondary | ICD-10-CM | POA: Diagnosis not present

## 2023-01-24 DIAGNOSIS — I69398 Other sequelae of cerebral infarction: Secondary | ICD-10-CM | POA: Diagnosis not present

## 2023-01-24 DIAGNOSIS — C3490 Malignant neoplasm of unspecified part of unspecified bronchus or lung: Secondary | ICD-10-CM | POA: Diagnosis not present

## 2023-01-24 DIAGNOSIS — I129 Hypertensive chronic kidney disease with stage 1 through stage 4 chronic kidney disease, or unspecified chronic kidney disease: Secondary | ICD-10-CM | POA: Diagnosis not present

## 2023-01-24 DIAGNOSIS — Z85118 Personal history of other malignant neoplasm of bronchus and lung: Secondary | ICD-10-CM | POA: Diagnosis not present

## 2023-01-24 DIAGNOSIS — J310 Chronic rhinitis: Secondary | ICD-10-CM | POA: Diagnosis not present

## 2023-01-24 DIAGNOSIS — E782 Mixed hyperlipidemia: Secondary | ICD-10-CM | POA: Diagnosis not present

## 2023-01-29 DIAGNOSIS — I69398 Other sequelae of cerebral infarction: Secondary | ICD-10-CM | POA: Diagnosis not present

## 2023-01-29 DIAGNOSIS — F0390 Unspecified dementia without behavioral disturbance: Secondary | ICD-10-CM | POA: Diagnosis not present

## 2023-01-29 DIAGNOSIS — I69319 Unspecified symptoms and signs involving cognitive functions following cerebral infarction: Secondary | ICD-10-CM | POA: Diagnosis not present

## 2023-01-29 DIAGNOSIS — C3490 Malignant neoplasm of unspecified part of unspecified bronchus or lung: Secondary | ICD-10-CM | POA: Diagnosis not present

## 2023-01-29 DIAGNOSIS — I69354 Hemiplegia and hemiparesis following cerebral infarction affecting left non-dominant side: Secondary | ICD-10-CM | POA: Diagnosis not present

## 2023-01-29 DIAGNOSIS — J449 Chronic obstructive pulmonary disease, unspecified: Secondary | ICD-10-CM | POA: Diagnosis not present

## 2023-01-29 NOTE — Progress Notes (Signed)
[facility] Summary Report / Loop Recorder 

## 2023-01-30 DIAGNOSIS — I69398 Other sequelae of cerebral infarction: Secondary | ICD-10-CM | POA: Diagnosis not present

## 2023-01-30 DIAGNOSIS — I69319 Unspecified symptoms and signs involving cognitive functions following cerebral infarction: Secondary | ICD-10-CM | POA: Diagnosis not present

## 2023-01-30 DIAGNOSIS — J449 Chronic obstructive pulmonary disease, unspecified: Secondary | ICD-10-CM | POA: Diagnosis not present

## 2023-01-30 DIAGNOSIS — C3490 Malignant neoplasm of unspecified part of unspecified bronchus or lung: Secondary | ICD-10-CM | POA: Diagnosis not present

## 2023-01-30 DIAGNOSIS — I69354 Hemiplegia and hemiparesis following cerebral infarction affecting left non-dominant side: Secondary | ICD-10-CM | POA: Diagnosis not present

## 2023-01-30 DIAGNOSIS — F0390 Unspecified dementia without behavioral disturbance: Secondary | ICD-10-CM | POA: Diagnosis not present

## 2023-02-05 DIAGNOSIS — F0390 Unspecified dementia without behavioral disturbance: Secondary | ICD-10-CM | POA: Diagnosis not present

## 2023-02-05 DIAGNOSIS — I69319 Unspecified symptoms and signs involving cognitive functions following cerebral infarction: Secondary | ICD-10-CM | POA: Diagnosis not present

## 2023-02-05 DIAGNOSIS — I69354 Hemiplegia and hemiparesis following cerebral infarction affecting left non-dominant side: Secondary | ICD-10-CM | POA: Diagnosis not present

## 2023-02-05 DIAGNOSIS — J449 Chronic obstructive pulmonary disease, unspecified: Secondary | ICD-10-CM | POA: Diagnosis not present

## 2023-02-05 DIAGNOSIS — I69398 Other sequelae of cerebral infarction: Secondary | ICD-10-CM | POA: Diagnosis not present

## 2023-02-05 DIAGNOSIS — C3490 Malignant neoplasm of unspecified part of unspecified bronchus or lung: Secondary | ICD-10-CM | POA: Diagnosis not present

## 2023-02-06 DIAGNOSIS — I69398 Other sequelae of cerebral infarction: Secondary | ICD-10-CM | POA: Diagnosis not present

## 2023-02-06 DIAGNOSIS — C3490 Malignant neoplasm of unspecified part of unspecified bronchus or lung: Secondary | ICD-10-CM | POA: Diagnosis not present

## 2023-02-06 DIAGNOSIS — I69354 Hemiplegia and hemiparesis following cerebral infarction affecting left non-dominant side: Secondary | ICD-10-CM | POA: Diagnosis not present

## 2023-02-06 DIAGNOSIS — I69319 Unspecified symptoms and signs involving cognitive functions following cerebral infarction: Secondary | ICD-10-CM | POA: Diagnosis not present

## 2023-02-06 DIAGNOSIS — F0390 Unspecified dementia without behavioral disturbance: Secondary | ICD-10-CM | POA: Diagnosis not present

## 2023-02-06 DIAGNOSIS — J449 Chronic obstructive pulmonary disease, unspecified: Secondary | ICD-10-CM | POA: Diagnosis not present

## 2023-02-12 DIAGNOSIS — I69398 Other sequelae of cerebral infarction: Secondary | ICD-10-CM | POA: Diagnosis not present

## 2023-02-12 DIAGNOSIS — J449 Chronic obstructive pulmonary disease, unspecified: Secondary | ICD-10-CM | POA: Diagnosis not present

## 2023-02-12 DIAGNOSIS — I69354 Hemiplegia and hemiparesis following cerebral infarction affecting left non-dominant side: Secondary | ICD-10-CM | POA: Diagnosis not present

## 2023-02-12 DIAGNOSIS — C3490 Malignant neoplasm of unspecified part of unspecified bronchus or lung: Secondary | ICD-10-CM | POA: Diagnosis not present

## 2023-02-12 DIAGNOSIS — F0390 Unspecified dementia without behavioral disturbance: Secondary | ICD-10-CM | POA: Diagnosis not present

## 2023-02-12 DIAGNOSIS — I69319 Unspecified symptoms and signs involving cognitive functions following cerebral infarction: Secondary | ICD-10-CM | POA: Diagnosis not present

## 2023-02-13 DIAGNOSIS — I69354 Hemiplegia and hemiparesis following cerebral infarction affecting left non-dominant side: Secondary | ICD-10-CM | POA: Diagnosis not present

## 2023-02-13 DIAGNOSIS — I69319 Unspecified symptoms and signs involving cognitive functions following cerebral infarction: Secondary | ICD-10-CM | POA: Diagnosis not present

## 2023-02-13 DIAGNOSIS — J449 Chronic obstructive pulmonary disease, unspecified: Secondary | ICD-10-CM | POA: Diagnosis not present

## 2023-02-13 DIAGNOSIS — F0390 Unspecified dementia without behavioral disturbance: Secondary | ICD-10-CM | POA: Diagnosis not present

## 2023-02-13 DIAGNOSIS — C3490 Malignant neoplasm of unspecified part of unspecified bronchus or lung: Secondary | ICD-10-CM | POA: Diagnosis not present

## 2023-02-13 DIAGNOSIS — I69398 Other sequelae of cerebral infarction: Secondary | ICD-10-CM | POA: Diagnosis not present

## 2023-02-19 DIAGNOSIS — C3490 Malignant neoplasm of unspecified part of unspecified bronchus or lung: Secondary | ICD-10-CM | POA: Diagnosis not present

## 2023-02-19 DIAGNOSIS — I69398 Other sequelae of cerebral infarction: Secondary | ICD-10-CM | POA: Diagnosis not present

## 2023-02-19 DIAGNOSIS — J449 Chronic obstructive pulmonary disease, unspecified: Secondary | ICD-10-CM | POA: Diagnosis not present

## 2023-02-19 DIAGNOSIS — I69319 Unspecified symptoms and signs involving cognitive functions following cerebral infarction: Secondary | ICD-10-CM | POA: Diagnosis not present

## 2023-02-19 DIAGNOSIS — I69354 Hemiplegia and hemiparesis following cerebral infarction affecting left non-dominant side: Secondary | ICD-10-CM | POA: Diagnosis not present

## 2023-02-19 DIAGNOSIS — F0390 Unspecified dementia without behavioral disturbance: Secondary | ICD-10-CM | POA: Diagnosis not present

## 2023-02-20 DIAGNOSIS — I69354 Hemiplegia and hemiparesis following cerebral infarction affecting left non-dominant side: Secondary | ICD-10-CM | POA: Diagnosis not present

## 2023-02-20 DIAGNOSIS — F0390 Unspecified dementia without behavioral disturbance: Secondary | ICD-10-CM | POA: Diagnosis not present

## 2023-02-20 DIAGNOSIS — I69398 Other sequelae of cerebral infarction: Secondary | ICD-10-CM | POA: Diagnosis not present

## 2023-02-20 DIAGNOSIS — I69319 Unspecified symptoms and signs involving cognitive functions following cerebral infarction: Secondary | ICD-10-CM | POA: Diagnosis not present

## 2023-02-20 DIAGNOSIS — C3490 Malignant neoplasm of unspecified part of unspecified bronchus or lung: Secondary | ICD-10-CM | POA: Diagnosis not present

## 2023-02-20 DIAGNOSIS — J449 Chronic obstructive pulmonary disease, unspecified: Secondary | ICD-10-CM | POA: Diagnosis not present

## 2023-02-24 ENCOUNTER — Ambulatory Visit (INDEPENDENT_AMBULATORY_CARE_PROVIDER_SITE_OTHER): Payer: Medicare Other

## 2023-02-24 DIAGNOSIS — G5791 Unspecified mononeuropathy of right lower limb: Secondary | ICD-10-CM | POA: Diagnosis not present

## 2023-02-24 DIAGNOSIS — I639 Cerebral infarction, unspecified: Secondary | ICD-10-CM

## 2023-02-24 DIAGNOSIS — F0390 Unspecified dementia without behavioral disturbance: Secondary | ICD-10-CM | POA: Diagnosis not present

## 2023-02-24 DIAGNOSIS — G9340 Encephalopathy, unspecified: Secondary | ICD-10-CM | POA: Diagnosis not present

## 2023-02-24 DIAGNOSIS — R5381 Other malaise: Secondary | ICD-10-CM | POA: Diagnosis not present

## 2023-02-24 DIAGNOSIS — N1832 Chronic kidney disease, stage 3b: Secondary | ICD-10-CM | POA: Diagnosis not present

## 2023-02-24 DIAGNOSIS — I69319 Unspecified symptoms and signs involving cognitive functions following cerebral infarction: Secondary | ICD-10-CM | POA: Diagnosis not present

## 2023-02-24 DIAGNOSIS — Z85118 Personal history of other malignant neoplasm of bronchus and lung: Secondary | ICD-10-CM | POA: Diagnosis not present

## 2023-02-24 DIAGNOSIS — I69398 Other sequelae of cerebral infarction: Secondary | ICD-10-CM | POA: Diagnosis not present

## 2023-02-24 DIAGNOSIS — Z9181 History of falling: Secondary | ICD-10-CM | POA: Diagnosis not present

## 2023-02-24 DIAGNOSIS — J310 Chronic rhinitis: Secondary | ICD-10-CM | POA: Diagnosis not present

## 2023-02-24 DIAGNOSIS — R451 Restlessness and agitation: Secondary | ICD-10-CM | POA: Diagnosis not present

## 2023-02-24 DIAGNOSIS — I679 Cerebrovascular disease, unspecified: Secondary | ICD-10-CM | POA: Diagnosis not present

## 2023-02-24 DIAGNOSIS — I129 Hypertensive chronic kidney disease with stage 1 through stage 4 chronic kidney disease, or unspecified chronic kidney disease: Secondary | ICD-10-CM | POA: Diagnosis not present

## 2023-02-24 DIAGNOSIS — Z902 Acquired absence of lung [part of]: Secondary | ICD-10-CM | POA: Diagnosis not present

## 2023-02-24 DIAGNOSIS — E790 Hyperuricemia without signs of inflammatory arthritis and tophaceous disease: Secondary | ICD-10-CM | POA: Diagnosis not present

## 2023-02-24 DIAGNOSIS — G5792 Unspecified mononeuropathy of left lower limb: Secondary | ICD-10-CM | POA: Diagnosis not present

## 2023-02-24 DIAGNOSIS — J449 Chronic obstructive pulmonary disease, unspecified: Secondary | ICD-10-CM | POA: Diagnosis not present

## 2023-02-24 DIAGNOSIS — E559 Vitamin D deficiency, unspecified: Secondary | ICD-10-CM | POA: Diagnosis not present

## 2023-02-24 DIAGNOSIS — E782 Mixed hyperlipidemia: Secondary | ICD-10-CM | POA: Diagnosis not present

## 2023-02-24 DIAGNOSIS — K5790 Diverticulosis of intestine, part unspecified, without perforation or abscess without bleeding: Secondary | ICD-10-CM | POA: Diagnosis not present

## 2023-02-24 DIAGNOSIS — M153 Secondary multiple arthritis: Secondary | ICD-10-CM | POA: Diagnosis not present

## 2023-02-24 DIAGNOSIS — I69354 Hemiplegia and hemiparesis following cerebral infarction affecting left non-dominant side: Secondary | ICD-10-CM | POA: Diagnosis not present

## 2023-02-24 DIAGNOSIS — C3490 Malignant neoplasm of unspecified part of unspecified bronchus or lung: Secondary | ICD-10-CM | POA: Diagnosis not present

## 2023-02-24 DIAGNOSIS — F329 Major depressive disorder, single episode, unspecified: Secondary | ICD-10-CM | POA: Diagnosis not present

## 2023-02-24 DIAGNOSIS — M179 Osteoarthritis of knee, unspecified: Secondary | ICD-10-CM | POA: Diagnosis not present

## 2023-02-24 LAB — CUP PACEART REMOTE DEVICE CHECK
Date Time Interrogation Session: 20240331230514
Implantable Pulse Generator Implant Date: 20210719

## 2023-02-26 DIAGNOSIS — I69319 Unspecified symptoms and signs involving cognitive functions following cerebral infarction: Secondary | ICD-10-CM | POA: Diagnosis not present

## 2023-02-26 DIAGNOSIS — F0390 Unspecified dementia without behavioral disturbance: Secondary | ICD-10-CM | POA: Diagnosis not present

## 2023-02-26 DIAGNOSIS — I69398 Other sequelae of cerebral infarction: Secondary | ICD-10-CM | POA: Diagnosis not present

## 2023-02-26 DIAGNOSIS — I69354 Hemiplegia and hemiparesis following cerebral infarction affecting left non-dominant side: Secondary | ICD-10-CM | POA: Diagnosis not present

## 2023-02-26 DIAGNOSIS — C3490 Malignant neoplasm of unspecified part of unspecified bronchus or lung: Secondary | ICD-10-CM | POA: Diagnosis not present

## 2023-02-26 DIAGNOSIS — J449 Chronic obstructive pulmonary disease, unspecified: Secondary | ICD-10-CM | POA: Diagnosis not present

## 2023-02-27 DIAGNOSIS — C3490 Malignant neoplasm of unspecified part of unspecified bronchus or lung: Secondary | ICD-10-CM | POA: Diagnosis not present

## 2023-02-27 DIAGNOSIS — I69319 Unspecified symptoms and signs involving cognitive functions following cerebral infarction: Secondary | ICD-10-CM | POA: Diagnosis not present

## 2023-02-27 DIAGNOSIS — J449 Chronic obstructive pulmonary disease, unspecified: Secondary | ICD-10-CM | POA: Diagnosis not present

## 2023-02-27 DIAGNOSIS — F0390 Unspecified dementia without behavioral disturbance: Secondary | ICD-10-CM | POA: Diagnosis not present

## 2023-02-27 DIAGNOSIS — I69398 Other sequelae of cerebral infarction: Secondary | ICD-10-CM | POA: Diagnosis not present

## 2023-02-27 DIAGNOSIS — I69354 Hemiplegia and hemiparesis following cerebral infarction affecting left non-dominant side: Secondary | ICD-10-CM | POA: Diagnosis not present

## 2023-02-28 NOTE — Progress Notes (Signed)
[facility] Summary Report / Loop Recorder 

## 2023-03-05 DIAGNOSIS — I69398 Other sequelae of cerebral infarction: Secondary | ICD-10-CM | POA: Diagnosis not present

## 2023-03-05 DIAGNOSIS — J449 Chronic obstructive pulmonary disease, unspecified: Secondary | ICD-10-CM | POA: Diagnosis not present

## 2023-03-05 DIAGNOSIS — F0390 Unspecified dementia without behavioral disturbance: Secondary | ICD-10-CM | POA: Diagnosis not present

## 2023-03-05 DIAGNOSIS — I69319 Unspecified symptoms and signs involving cognitive functions following cerebral infarction: Secondary | ICD-10-CM | POA: Diagnosis not present

## 2023-03-05 DIAGNOSIS — C3490 Malignant neoplasm of unspecified part of unspecified bronchus or lung: Secondary | ICD-10-CM | POA: Diagnosis not present

## 2023-03-05 DIAGNOSIS — I69354 Hemiplegia and hemiparesis following cerebral infarction affecting left non-dominant side: Secondary | ICD-10-CM | POA: Diagnosis not present

## 2023-03-06 DIAGNOSIS — C3490 Malignant neoplasm of unspecified part of unspecified bronchus or lung: Secondary | ICD-10-CM | POA: Diagnosis not present

## 2023-03-06 DIAGNOSIS — I69319 Unspecified symptoms and signs involving cognitive functions following cerebral infarction: Secondary | ICD-10-CM | POA: Diagnosis not present

## 2023-03-06 DIAGNOSIS — J449 Chronic obstructive pulmonary disease, unspecified: Secondary | ICD-10-CM | POA: Diagnosis not present

## 2023-03-06 DIAGNOSIS — F0390 Unspecified dementia without behavioral disturbance: Secondary | ICD-10-CM | POA: Diagnosis not present

## 2023-03-06 DIAGNOSIS — I69398 Other sequelae of cerebral infarction: Secondary | ICD-10-CM | POA: Diagnosis not present

## 2023-03-06 DIAGNOSIS — I69354 Hemiplegia and hemiparesis following cerebral infarction affecting left non-dominant side: Secondary | ICD-10-CM | POA: Diagnosis not present

## 2023-03-12 DIAGNOSIS — F0390 Unspecified dementia without behavioral disturbance: Secondary | ICD-10-CM | POA: Diagnosis not present

## 2023-03-12 DIAGNOSIS — J449 Chronic obstructive pulmonary disease, unspecified: Secondary | ICD-10-CM | POA: Diagnosis not present

## 2023-03-12 DIAGNOSIS — C3490 Malignant neoplasm of unspecified part of unspecified bronchus or lung: Secondary | ICD-10-CM | POA: Diagnosis not present

## 2023-03-12 DIAGNOSIS — I69354 Hemiplegia and hemiparesis following cerebral infarction affecting left non-dominant side: Secondary | ICD-10-CM | POA: Diagnosis not present

## 2023-03-12 DIAGNOSIS — I69319 Unspecified symptoms and signs involving cognitive functions following cerebral infarction: Secondary | ICD-10-CM | POA: Diagnosis not present

## 2023-03-12 DIAGNOSIS — I69398 Other sequelae of cerebral infarction: Secondary | ICD-10-CM | POA: Diagnosis not present

## 2023-03-13 DIAGNOSIS — F0390 Unspecified dementia without behavioral disturbance: Secondary | ICD-10-CM | POA: Diagnosis not present

## 2023-03-13 DIAGNOSIS — C3490 Malignant neoplasm of unspecified part of unspecified bronchus or lung: Secondary | ICD-10-CM | POA: Diagnosis not present

## 2023-03-13 DIAGNOSIS — I69319 Unspecified symptoms and signs involving cognitive functions following cerebral infarction: Secondary | ICD-10-CM | POA: Diagnosis not present

## 2023-03-13 DIAGNOSIS — I69398 Other sequelae of cerebral infarction: Secondary | ICD-10-CM | POA: Diagnosis not present

## 2023-03-13 DIAGNOSIS — I69354 Hemiplegia and hemiparesis following cerebral infarction affecting left non-dominant side: Secondary | ICD-10-CM | POA: Diagnosis not present

## 2023-03-13 DIAGNOSIS — J449 Chronic obstructive pulmonary disease, unspecified: Secondary | ICD-10-CM | POA: Diagnosis not present

## 2023-03-19 DIAGNOSIS — J449 Chronic obstructive pulmonary disease, unspecified: Secondary | ICD-10-CM | POA: Diagnosis not present

## 2023-03-19 DIAGNOSIS — C3490 Malignant neoplasm of unspecified part of unspecified bronchus or lung: Secondary | ICD-10-CM | POA: Diagnosis not present

## 2023-03-19 DIAGNOSIS — I69319 Unspecified symptoms and signs involving cognitive functions following cerebral infarction: Secondary | ICD-10-CM | POA: Diagnosis not present

## 2023-03-19 DIAGNOSIS — I69354 Hemiplegia and hemiparesis following cerebral infarction affecting left non-dominant side: Secondary | ICD-10-CM | POA: Diagnosis not present

## 2023-03-19 DIAGNOSIS — I69398 Other sequelae of cerebral infarction: Secondary | ICD-10-CM | POA: Diagnosis not present

## 2023-03-19 DIAGNOSIS — F0390 Unspecified dementia without behavioral disturbance: Secondary | ICD-10-CM | POA: Diagnosis not present

## 2023-03-20 DIAGNOSIS — F0390 Unspecified dementia without behavioral disturbance: Secondary | ICD-10-CM | POA: Diagnosis not present

## 2023-03-20 DIAGNOSIS — C3490 Malignant neoplasm of unspecified part of unspecified bronchus or lung: Secondary | ICD-10-CM | POA: Diagnosis not present

## 2023-03-20 DIAGNOSIS — I69319 Unspecified symptoms and signs involving cognitive functions following cerebral infarction: Secondary | ICD-10-CM | POA: Diagnosis not present

## 2023-03-20 DIAGNOSIS — J449 Chronic obstructive pulmonary disease, unspecified: Secondary | ICD-10-CM | POA: Diagnosis not present

## 2023-03-20 DIAGNOSIS — I69398 Other sequelae of cerebral infarction: Secondary | ICD-10-CM | POA: Diagnosis not present

## 2023-03-20 DIAGNOSIS — I69354 Hemiplegia and hemiparesis following cerebral infarction affecting left non-dominant side: Secondary | ICD-10-CM | POA: Diagnosis not present

## 2023-03-26 DIAGNOSIS — R5381 Other malaise: Secondary | ICD-10-CM | POA: Diagnosis not present

## 2023-03-26 DIAGNOSIS — F0390 Unspecified dementia without behavioral disturbance: Secondary | ICD-10-CM | POA: Diagnosis not present

## 2023-03-26 DIAGNOSIS — Z902 Acquired absence of lung [part of]: Secondary | ICD-10-CM | POA: Diagnosis not present

## 2023-03-26 DIAGNOSIS — I69354 Hemiplegia and hemiparesis following cerebral infarction affecting left non-dominant side: Secondary | ICD-10-CM | POA: Diagnosis not present

## 2023-03-26 DIAGNOSIS — I69319 Unspecified symptoms and signs involving cognitive functions following cerebral infarction: Secondary | ICD-10-CM | POA: Diagnosis not present

## 2023-03-26 DIAGNOSIS — J310 Chronic rhinitis: Secondary | ICD-10-CM | POA: Diagnosis not present

## 2023-03-26 DIAGNOSIS — I679 Cerebrovascular disease, unspecified: Secondary | ICD-10-CM | POA: Diagnosis not present

## 2023-03-26 DIAGNOSIS — E790 Hyperuricemia without signs of inflammatory arthritis and tophaceous disease: Secondary | ICD-10-CM | POA: Diagnosis not present

## 2023-03-26 DIAGNOSIS — M153 Secondary multiple arthritis: Secondary | ICD-10-CM | POA: Diagnosis not present

## 2023-03-26 DIAGNOSIS — Z9181 History of falling: Secondary | ICD-10-CM | POA: Diagnosis not present

## 2023-03-26 DIAGNOSIS — Z85118 Personal history of other malignant neoplasm of bronchus and lung: Secondary | ICD-10-CM | POA: Diagnosis not present

## 2023-03-26 DIAGNOSIS — M179 Osteoarthritis of knee, unspecified: Secondary | ICD-10-CM | POA: Diagnosis not present

## 2023-03-26 DIAGNOSIS — G5792 Unspecified mononeuropathy of left lower limb: Secondary | ICD-10-CM | POA: Diagnosis not present

## 2023-03-26 DIAGNOSIS — E559 Vitamin D deficiency, unspecified: Secondary | ICD-10-CM | POA: Diagnosis not present

## 2023-03-26 DIAGNOSIS — J449 Chronic obstructive pulmonary disease, unspecified: Secondary | ICD-10-CM | POA: Diagnosis not present

## 2023-03-26 DIAGNOSIS — F329 Major depressive disorder, single episode, unspecified: Secondary | ICD-10-CM | POA: Diagnosis not present

## 2023-03-26 DIAGNOSIS — C3490 Malignant neoplasm of unspecified part of unspecified bronchus or lung: Secondary | ICD-10-CM | POA: Diagnosis not present

## 2023-03-26 DIAGNOSIS — R451 Restlessness and agitation: Secondary | ICD-10-CM | POA: Diagnosis not present

## 2023-03-26 DIAGNOSIS — G5791 Unspecified mononeuropathy of right lower limb: Secondary | ICD-10-CM | POA: Diagnosis not present

## 2023-03-26 DIAGNOSIS — E782 Mixed hyperlipidemia: Secondary | ICD-10-CM | POA: Diagnosis not present

## 2023-03-26 DIAGNOSIS — K5790 Diverticulosis of intestine, part unspecified, without perforation or abscess without bleeding: Secondary | ICD-10-CM | POA: Diagnosis not present

## 2023-03-26 DIAGNOSIS — I129 Hypertensive chronic kidney disease with stage 1 through stage 4 chronic kidney disease, or unspecified chronic kidney disease: Secondary | ICD-10-CM | POA: Diagnosis not present

## 2023-03-26 DIAGNOSIS — N1832 Chronic kidney disease, stage 3b: Secondary | ICD-10-CM | POA: Diagnosis not present

## 2023-03-26 DIAGNOSIS — G9340 Encephalopathy, unspecified: Secondary | ICD-10-CM | POA: Diagnosis not present

## 2023-03-26 DIAGNOSIS — I69398 Other sequelae of cerebral infarction: Secondary | ICD-10-CM | POA: Diagnosis not present

## 2023-03-27 DIAGNOSIS — F0390 Unspecified dementia without behavioral disturbance: Secondary | ICD-10-CM | POA: Diagnosis not present

## 2023-03-27 DIAGNOSIS — C3490 Malignant neoplasm of unspecified part of unspecified bronchus or lung: Secondary | ICD-10-CM | POA: Diagnosis not present

## 2023-03-27 DIAGNOSIS — I69398 Other sequelae of cerebral infarction: Secondary | ICD-10-CM | POA: Diagnosis not present

## 2023-03-27 DIAGNOSIS — I69319 Unspecified symptoms and signs involving cognitive functions following cerebral infarction: Secondary | ICD-10-CM | POA: Diagnosis not present

## 2023-03-27 DIAGNOSIS — I69354 Hemiplegia and hemiparesis following cerebral infarction affecting left non-dominant side: Secondary | ICD-10-CM | POA: Diagnosis not present

## 2023-03-27 DIAGNOSIS — J449 Chronic obstructive pulmonary disease, unspecified: Secondary | ICD-10-CM | POA: Diagnosis not present

## 2023-03-31 ENCOUNTER — Ambulatory Visit (INDEPENDENT_AMBULATORY_CARE_PROVIDER_SITE_OTHER): Payer: Medicare Other

## 2023-03-31 DIAGNOSIS — I639 Cerebral infarction, unspecified: Secondary | ICD-10-CM | POA: Diagnosis not present

## 2023-03-31 LAB — CUP PACEART REMOTE DEVICE CHECK
Date Time Interrogation Session: 20240505230232
Implantable Pulse Generator Implant Date: 20210719

## 2023-04-01 NOTE — Progress Notes (Signed)
[facility] Summary Report / Loop Recorder 

## 2023-04-02 DIAGNOSIS — J449 Chronic obstructive pulmonary disease, unspecified: Secondary | ICD-10-CM | POA: Diagnosis not present

## 2023-04-02 DIAGNOSIS — C3490 Malignant neoplasm of unspecified part of unspecified bronchus or lung: Secondary | ICD-10-CM | POA: Diagnosis not present

## 2023-04-02 DIAGNOSIS — I69354 Hemiplegia and hemiparesis following cerebral infarction affecting left non-dominant side: Secondary | ICD-10-CM | POA: Diagnosis not present

## 2023-04-02 DIAGNOSIS — I69319 Unspecified symptoms and signs involving cognitive functions following cerebral infarction: Secondary | ICD-10-CM | POA: Diagnosis not present

## 2023-04-02 DIAGNOSIS — F0390 Unspecified dementia without behavioral disturbance: Secondary | ICD-10-CM | POA: Diagnosis not present

## 2023-04-02 DIAGNOSIS — I69398 Other sequelae of cerebral infarction: Secondary | ICD-10-CM | POA: Diagnosis not present

## 2023-04-03 DIAGNOSIS — I69319 Unspecified symptoms and signs involving cognitive functions following cerebral infarction: Secondary | ICD-10-CM | POA: Diagnosis not present

## 2023-04-03 DIAGNOSIS — J449 Chronic obstructive pulmonary disease, unspecified: Secondary | ICD-10-CM | POA: Diagnosis not present

## 2023-04-03 DIAGNOSIS — I69354 Hemiplegia and hemiparesis following cerebral infarction affecting left non-dominant side: Secondary | ICD-10-CM | POA: Diagnosis not present

## 2023-04-03 DIAGNOSIS — F0390 Unspecified dementia without behavioral disturbance: Secondary | ICD-10-CM | POA: Diagnosis not present

## 2023-04-03 DIAGNOSIS — C3490 Malignant neoplasm of unspecified part of unspecified bronchus or lung: Secondary | ICD-10-CM | POA: Diagnosis not present

## 2023-04-03 DIAGNOSIS — I69398 Other sequelae of cerebral infarction: Secondary | ICD-10-CM | POA: Diagnosis not present

## 2023-04-09 DIAGNOSIS — J449 Chronic obstructive pulmonary disease, unspecified: Secondary | ICD-10-CM | POA: Diagnosis not present

## 2023-04-09 DIAGNOSIS — F0390 Unspecified dementia without behavioral disturbance: Secondary | ICD-10-CM | POA: Diagnosis not present

## 2023-04-09 DIAGNOSIS — I69398 Other sequelae of cerebral infarction: Secondary | ICD-10-CM | POA: Diagnosis not present

## 2023-04-09 DIAGNOSIS — I69319 Unspecified symptoms and signs involving cognitive functions following cerebral infarction: Secondary | ICD-10-CM | POA: Diagnosis not present

## 2023-04-09 DIAGNOSIS — I69354 Hemiplegia and hemiparesis following cerebral infarction affecting left non-dominant side: Secondary | ICD-10-CM | POA: Diagnosis not present

## 2023-04-09 DIAGNOSIS — C3490 Malignant neoplasm of unspecified part of unspecified bronchus or lung: Secondary | ICD-10-CM | POA: Diagnosis not present

## 2023-04-10 DIAGNOSIS — F0390 Unspecified dementia without behavioral disturbance: Secondary | ICD-10-CM | POA: Diagnosis not present

## 2023-04-10 DIAGNOSIS — I69354 Hemiplegia and hemiparesis following cerebral infarction affecting left non-dominant side: Secondary | ICD-10-CM | POA: Diagnosis not present

## 2023-04-10 DIAGNOSIS — C3490 Malignant neoplasm of unspecified part of unspecified bronchus or lung: Secondary | ICD-10-CM | POA: Diagnosis not present

## 2023-04-10 DIAGNOSIS — J449 Chronic obstructive pulmonary disease, unspecified: Secondary | ICD-10-CM | POA: Diagnosis not present

## 2023-04-10 DIAGNOSIS — I69319 Unspecified symptoms and signs involving cognitive functions following cerebral infarction: Secondary | ICD-10-CM | POA: Diagnosis not present

## 2023-04-10 DIAGNOSIS — I69398 Other sequelae of cerebral infarction: Secondary | ICD-10-CM | POA: Diagnosis not present

## 2023-04-16 DIAGNOSIS — I69354 Hemiplegia and hemiparesis following cerebral infarction affecting left non-dominant side: Secondary | ICD-10-CM | POA: Diagnosis not present

## 2023-04-16 DIAGNOSIS — F0390 Unspecified dementia without behavioral disturbance: Secondary | ICD-10-CM | POA: Diagnosis not present

## 2023-04-16 DIAGNOSIS — C3490 Malignant neoplasm of unspecified part of unspecified bronchus or lung: Secondary | ICD-10-CM | POA: Diagnosis not present

## 2023-04-16 DIAGNOSIS — I69319 Unspecified symptoms and signs involving cognitive functions following cerebral infarction: Secondary | ICD-10-CM | POA: Diagnosis not present

## 2023-04-16 DIAGNOSIS — I69398 Other sequelae of cerebral infarction: Secondary | ICD-10-CM | POA: Diagnosis not present

## 2023-04-16 DIAGNOSIS — J449 Chronic obstructive pulmonary disease, unspecified: Secondary | ICD-10-CM | POA: Diagnosis not present

## 2023-04-17 DIAGNOSIS — J449 Chronic obstructive pulmonary disease, unspecified: Secondary | ICD-10-CM | POA: Diagnosis not present

## 2023-04-17 DIAGNOSIS — F0390 Unspecified dementia without behavioral disturbance: Secondary | ICD-10-CM | POA: Diagnosis not present

## 2023-04-17 DIAGNOSIS — C3490 Malignant neoplasm of unspecified part of unspecified bronchus or lung: Secondary | ICD-10-CM | POA: Diagnosis not present

## 2023-04-17 DIAGNOSIS — I69354 Hemiplegia and hemiparesis following cerebral infarction affecting left non-dominant side: Secondary | ICD-10-CM | POA: Diagnosis not present

## 2023-04-17 DIAGNOSIS — I69398 Other sequelae of cerebral infarction: Secondary | ICD-10-CM | POA: Diagnosis not present

## 2023-04-17 DIAGNOSIS — I69319 Unspecified symptoms and signs involving cognitive functions following cerebral infarction: Secondary | ICD-10-CM | POA: Diagnosis not present

## 2023-04-23 DIAGNOSIS — I69398 Other sequelae of cerebral infarction: Secondary | ICD-10-CM | POA: Diagnosis not present

## 2023-04-23 DIAGNOSIS — F0390 Unspecified dementia without behavioral disturbance: Secondary | ICD-10-CM | POA: Diagnosis not present

## 2023-04-23 DIAGNOSIS — I69354 Hemiplegia and hemiparesis following cerebral infarction affecting left non-dominant side: Secondary | ICD-10-CM | POA: Diagnosis not present

## 2023-04-23 DIAGNOSIS — C3490 Malignant neoplasm of unspecified part of unspecified bronchus or lung: Secondary | ICD-10-CM | POA: Diagnosis not present

## 2023-04-23 DIAGNOSIS — J449 Chronic obstructive pulmonary disease, unspecified: Secondary | ICD-10-CM | POA: Diagnosis not present

## 2023-04-23 DIAGNOSIS — I69319 Unspecified symptoms and signs involving cognitive functions following cerebral infarction: Secondary | ICD-10-CM | POA: Diagnosis not present

## 2023-04-24 DIAGNOSIS — J449 Chronic obstructive pulmonary disease, unspecified: Secondary | ICD-10-CM | POA: Diagnosis not present

## 2023-04-24 DIAGNOSIS — C3490 Malignant neoplasm of unspecified part of unspecified bronchus or lung: Secondary | ICD-10-CM | POA: Diagnosis not present

## 2023-04-24 DIAGNOSIS — F0390 Unspecified dementia without behavioral disturbance: Secondary | ICD-10-CM | POA: Diagnosis not present

## 2023-04-24 DIAGNOSIS — I69354 Hemiplegia and hemiparesis following cerebral infarction affecting left non-dominant side: Secondary | ICD-10-CM | POA: Diagnosis not present

## 2023-04-24 DIAGNOSIS — I69319 Unspecified symptoms and signs involving cognitive functions following cerebral infarction: Secondary | ICD-10-CM | POA: Diagnosis not present

## 2023-04-24 DIAGNOSIS — I69398 Other sequelae of cerebral infarction: Secondary | ICD-10-CM | POA: Diagnosis not present

## 2023-04-25 DIAGNOSIS — J449 Chronic obstructive pulmonary disease, unspecified: Secondary | ICD-10-CM | POA: Diagnosis not present

## 2023-04-25 DIAGNOSIS — E039 Hypothyroidism, unspecified: Secondary | ICD-10-CM | POA: Diagnosis not present

## 2023-04-25 DIAGNOSIS — C3490 Malignant neoplasm of unspecified part of unspecified bronchus or lung: Secondary | ICD-10-CM | POA: Diagnosis not present

## 2023-04-25 DIAGNOSIS — E782 Mixed hyperlipidemia: Secondary | ICD-10-CM | POA: Diagnosis not present

## 2023-04-25 DIAGNOSIS — R531 Weakness: Secondary | ICD-10-CM | POA: Diagnosis not present

## 2023-04-25 DIAGNOSIS — I10 Essential (primary) hypertension: Secondary | ICD-10-CM | POA: Diagnosis not present

## 2023-04-26 DIAGNOSIS — J449 Chronic obstructive pulmonary disease, unspecified: Secondary | ICD-10-CM | POA: Diagnosis not present

## 2023-04-26 DIAGNOSIS — F0390 Unspecified dementia without behavioral disturbance: Secondary | ICD-10-CM | POA: Diagnosis not present

## 2023-04-26 DIAGNOSIS — G5791 Unspecified mononeuropathy of right lower limb: Secondary | ICD-10-CM | POA: Diagnosis not present

## 2023-04-26 DIAGNOSIS — F329 Major depressive disorder, single episode, unspecified: Secondary | ICD-10-CM | POA: Diagnosis not present

## 2023-04-26 DIAGNOSIS — I69398 Other sequelae of cerebral infarction: Secondary | ICD-10-CM | POA: Diagnosis not present

## 2023-04-26 DIAGNOSIS — Z9181 History of falling: Secondary | ICD-10-CM | POA: Diagnosis not present

## 2023-04-26 DIAGNOSIS — E790 Hyperuricemia without signs of inflammatory arthritis and tophaceous disease: Secondary | ICD-10-CM | POA: Diagnosis not present

## 2023-04-26 DIAGNOSIS — I679 Cerebrovascular disease, unspecified: Secondary | ICD-10-CM | POA: Diagnosis not present

## 2023-04-26 DIAGNOSIS — I69354 Hemiplegia and hemiparesis following cerebral infarction affecting left non-dominant side: Secondary | ICD-10-CM | POA: Diagnosis not present

## 2023-04-26 DIAGNOSIS — Z85118 Personal history of other malignant neoplasm of bronchus and lung: Secondary | ICD-10-CM | POA: Diagnosis not present

## 2023-04-26 DIAGNOSIS — I129 Hypertensive chronic kidney disease with stage 1 through stage 4 chronic kidney disease, or unspecified chronic kidney disease: Secondary | ICD-10-CM | POA: Diagnosis not present

## 2023-04-26 DIAGNOSIS — E559 Vitamin D deficiency, unspecified: Secondary | ICD-10-CM | POA: Diagnosis not present

## 2023-04-26 DIAGNOSIS — K5790 Diverticulosis of intestine, part unspecified, without perforation or abscess without bleeding: Secondary | ICD-10-CM | POA: Diagnosis not present

## 2023-04-26 DIAGNOSIS — Z902 Acquired absence of lung [part of]: Secondary | ICD-10-CM | POA: Diagnosis not present

## 2023-04-26 DIAGNOSIS — I69319 Unspecified symptoms and signs involving cognitive functions following cerebral infarction: Secondary | ICD-10-CM | POA: Diagnosis not present

## 2023-04-26 DIAGNOSIS — C3490 Malignant neoplasm of unspecified part of unspecified bronchus or lung: Secondary | ICD-10-CM | POA: Diagnosis not present

## 2023-04-26 DIAGNOSIS — M179 Osteoarthritis of knee, unspecified: Secondary | ICD-10-CM | POA: Diagnosis not present

## 2023-04-26 DIAGNOSIS — G9340 Encephalopathy, unspecified: Secondary | ICD-10-CM | POA: Diagnosis not present

## 2023-04-26 DIAGNOSIS — J310 Chronic rhinitis: Secondary | ICD-10-CM | POA: Diagnosis not present

## 2023-04-26 DIAGNOSIS — E782 Mixed hyperlipidemia: Secondary | ICD-10-CM | POA: Diagnosis not present

## 2023-04-26 DIAGNOSIS — R451 Restlessness and agitation: Secondary | ICD-10-CM | POA: Diagnosis not present

## 2023-04-26 DIAGNOSIS — N1832 Chronic kidney disease, stage 3b: Secondary | ICD-10-CM | POA: Diagnosis not present

## 2023-04-26 DIAGNOSIS — M153 Secondary multiple arthritis: Secondary | ICD-10-CM | POA: Diagnosis not present

## 2023-04-26 DIAGNOSIS — G5792 Unspecified mononeuropathy of left lower limb: Secondary | ICD-10-CM | POA: Diagnosis not present

## 2023-04-26 DIAGNOSIS — R5381 Other malaise: Secondary | ICD-10-CM | POA: Diagnosis not present

## 2023-04-28 NOTE — Progress Notes (Signed)
[facility] Summary Report / Loop Recorder 

## 2023-04-30 DIAGNOSIS — I69354 Hemiplegia and hemiparesis following cerebral infarction affecting left non-dominant side: Secondary | ICD-10-CM | POA: Diagnosis not present

## 2023-04-30 DIAGNOSIS — F0390 Unspecified dementia without behavioral disturbance: Secondary | ICD-10-CM | POA: Diagnosis not present

## 2023-04-30 DIAGNOSIS — J449 Chronic obstructive pulmonary disease, unspecified: Secondary | ICD-10-CM | POA: Diagnosis not present

## 2023-04-30 DIAGNOSIS — C3490 Malignant neoplasm of unspecified part of unspecified bronchus or lung: Secondary | ICD-10-CM | POA: Diagnosis not present

## 2023-04-30 DIAGNOSIS — I69398 Other sequelae of cerebral infarction: Secondary | ICD-10-CM | POA: Diagnosis not present

## 2023-04-30 DIAGNOSIS — I69319 Unspecified symptoms and signs involving cognitive functions following cerebral infarction: Secondary | ICD-10-CM | POA: Diagnosis not present

## 2023-05-01 DIAGNOSIS — F0390 Unspecified dementia without behavioral disturbance: Secondary | ICD-10-CM | POA: Diagnosis not present

## 2023-05-01 DIAGNOSIS — C3490 Malignant neoplasm of unspecified part of unspecified bronchus or lung: Secondary | ICD-10-CM | POA: Diagnosis not present

## 2023-05-01 DIAGNOSIS — I69398 Other sequelae of cerebral infarction: Secondary | ICD-10-CM | POA: Diagnosis not present

## 2023-05-01 DIAGNOSIS — I69354 Hemiplegia and hemiparesis following cerebral infarction affecting left non-dominant side: Secondary | ICD-10-CM | POA: Diagnosis not present

## 2023-05-01 DIAGNOSIS — J449 Chronic obstructive pulmonary disease, unspecified: Secondary | ICD-10-CM | POA: Diagnosis not present

## 2023-05-01 DIAGNOSIS — I69319 Unspecified symptoms and signs involving cognitive functions following cerebral infarction: Secondary | ICD-10-CM | POA: Diagnosis not present

## 2023-05-05 ENCOUNTER — Ambulatory Visit (INDEPENDENT_AMBULATORY_CARE_PROVIDER_SITE_OTHER): Payer: Medicare Other

## 2023-05-05 DIAGNOSIS — I639 Cerebral infarction, unspecified: Secondary | ICD-10-CM

## 2023-05-05 LAB — CUP PACEART REMOTE DEVICE CHECK
Date Time Interrogation Session: 20240609230327
Implantable Pulse Generator Implant Date: 20210719

## 2023-05-07 DIAGNOSIS — F0390 Unspecified dementia without behavioral disturbance: Secondary | ICD-10-CM | POA: Diagnosis not present

## 2023-05-07 DIAGNOSIS — C3490 Malignant neoplasm of unspecified part of unspecified bronchus or lung: Secondary | ICD-10-CM | POA: Diagnosis not present

## 2023-05-07 DIAGNOSIS — I69319 Unspecified symptoms and signs involving cognitive functions following cerebral infarction: Secondary | ICD-10-CM | POA: Diagnosis not present

## 2023-05-07 DIAGNOSIS — I69398 Other sequelae of cerebral infarction: Secondary | ICD-10-CM | POA: Diagnosis not present

## 2023-05-07 DIAGNOSIS — I69354 Hemiplegia and hemiparesis following cerebral infarction affecting left non-dominant side: Secondary | ICD-10-CM | POA: Diagnosis not present

## 2023-05-07 DIAGNOSIS — J449 Chronic obstructive pulmonary disease, unspecified: Secondary | ICD-10-CM | POA: Diagnosis not present

## 2023-05-08 DIAGNOSIS — I69398 Other sequelae of cerebral infarction: Secondary | ICD-10-CM | POA: Diagnosis not present

## 2023-05-08 DIAGNOSIS — I69354 Hemiplegia and hemiparesis following cerebral infarction affecting left non-dominant side: Secondary | ICD-10-CM | POA: Diagnosis not present

## 2023-05-08 DIAGNOSIS — F0390 Unspecified dementia without behavioral disturbance: Secondary | ICD-10-CM | POA: Diagnosis not present

## 2023-05-08 DIAGNOSIS — J449 Chronic obstructive pulmonary disease, unspecified: Secondary | ICD-10-CM | POA: Diagnosis not present

## 2023-05-08 DIAGNOSIS — C3490 Malignant neoplasm of unspecified part of unspecified bronchus or lung: Secondary | ICD-10-CM | POA: Diagnosis not present

## 2023-05-08 DIAGNOSIS — I69319 Unspecified symptoms and signs involving cognitive functions following cerebral infarction: Secondary | ICD-10-CM | POA: Diagnosis not present

## 2023-05-14 DIAGNOSIS — J449 Chronic obstructive pulmonary disease, unspecified: Secondary | ICD-10-CM | POA: Diagnosis not present

## 2023-05-14 DIAGNOSIS — F0390 Unspecified dementia without behavioral disturbance: Secondary | ICD-10-CM | POA: Diagnosis not present

## 2023-05-14 DIAGNOSIS — I69319 Unspecified symptoms and signs involving cognitive functions following cerebral infarction: Secondary | ICD-10-CM | POA: Diagnosis not present

## 2023-05-14 DIAGNOSIS — I69354 Hemiplegia and hemiparesis following cerebral infarction affecting left non-dominant side: Secondary | ICD-10-CM | POA: Diagnosis not present

## 2023-05-14 DIAGNOSIS — I69398 Other sequelae of cerebral infarction: Secondary | ICD-10-CM | POA: Diagnosis not present

## 2023-05-14 DIAGNOSIS — C3490 Malignant neoplasm of unspecified part of unspecified bronchus or lung: Secondary | ICD-10-CM | POA: Diagnosis not present

## 2023-05-15 DIAGNOSIS — J449 Chronic obstructive pulmonary disease, unspecified: Secondary | ICD-10-CM | POA: Diagnosis not present

## 2023-05-15 DIAGNOSIS — I69398 Other sequelae of cerebral infarction: Secondary | ICD-10-CM | POA: Diagnosis not present

## 2023-05-15 DIAGNOSIS — C3490 Malignant neoplasm of unspecified part of unspecified bronchus or lung: Secondary | ICD-10-CM | POA: Diagnosis not present

## 2023-05-15 DIAGNOSIS — I69319 Unspecified symptoms and signs involving cognitive functions following cerebral infarction: Secondary | ICD-10-CM | POA: Diagnosis not present

## 2023-05-15 DIAGNOSIS — I69354 Hemiplegia and hemiparesis following cerebral infarction affecting left non-dominant side: Secondary | ICD-10-CM | POA: Diagnosis not present

## 2023-05-15 DIAGNOSIS — F0390 Unspecified dementia without behavioral disturbance: Secondary | ICD-10-CM | POA: Diagnosis not present

## 2023-05-21 DIAGNOSIS — I69398 Other sequelae of cerebral infarction: Secondary | ICD-10-CM | POA: Diagnosis not present

## 2023-05-21 DIAGNOSIS — I69354 Hemiplegia and hemiparesis following cerebral infarction affecting left non-dominant side: Secondary | ICD-10-CM | POA: Diagnosis not present

## 2023-05-21 DIAGNOSIS — F0390 Unspecified dementia without behavioral disturbance: Secondary | ICD-10-CM | POA: Diagnosis not present

## 2023-05-21 DIAGNOSIS — J449 Chronic obstructive pulmonary disease, unspecified: Secondary | ICD-10-CM | POA: Diagnosis not present

## 2023-05-21 DIAGNOSIS — C3490 Malignant neoplasm of unspecified part of unspecified bronchus or lung: Secondary | ICD-10-CM | POA: Diagnosis not present

## 2023-05-21 DIAGNOSIS — I69319 Unspecified symptoms and signs involving cognitive functions following cerebral infarction: Secondary | ICD-10-CM | POA: Diagnosis not present

## 2023-05-22 DIAGNOSIS — F0390 Unspecified dementia without behavioral disturbance: Secondary | ICD-10-CM | POA: Diagnosis not present

## 2023-05-22 DIAGNOSIS — J449 Chronic obstructive pulmonary disease, unspecified: Secondary | ICD-10-CM | POA: Diagnosis not present

## 2023-05-22 DIAGNOSIS — I69398 Other sequelae of cerebral infarction: Secondary | ICD-10-CM | POA: Diagnosis not present

## 2023-05-22 DIAGNOSIS — I69319 Unspecified symptoms and signs involving cognitive functions following cerebral infarction: Secondary | ICD-10-CM | POA: Diagnosis not present

## 2023-05-22 DIAGNOSIS — I69354 Hemiplegia and hemiparesis following cerebral infarction affecting left non-dominant side: Secondary | ICD-10-CM | POA: Diagnosis not present

## 2023-05-22 DIAGNOSIS — C3490 Malignant neoplasm of unspecified part of unspecified bronchus or lung: Secondary | ICD-10-CM | POA: Diagnosis not present

## 2023-05-26 DIAGNOSIS — J310 Chronic rhinitis: Secondary | ICD-10-CM | POA: Diagnosis not present

## 2023-05-26 DIAGNOSIS — R451 Restlessness and agitation: Secondary | ICD-10-CM | POA: Diagnosis not present

## 2023-05-26 DIAGNOSIS — I129 Hypertensive chronic kidney disease with stage 1 through stage 4 chronic kidney disease, or unspecified chronic kidney disease: Secondary | ICD-10-CM | POA: Diagnosis not present

## 2023-05-26 DIAGNOSIS — I69398 Other sequelae of cerebral infarction: Secondary | ICD-10-CM | POA: Diagnosis not present

## 2023-05-26 DIAGNOSIS — I69319 Unspecified symptoms and signs involving cognitive functions following cerebral infarction: Secondary | ICD-10-CM | POA: Diagnosis not present

## 2023-05-26 DIAGNOSIS — F329 Major depressive disorder, single episode, unspecified: Secondary | ICD-10-CM | POA: Diagnosis not present

## 2023-05-26 DIAGNOSIS — J449 Chronic obstructive pulmonary disease, unspecified: Secondary | ICD-10-CM | POA: Diagnosis not present

## 2023-05-26 DIAGNOSIS — N1832 Chronic kidney disease, stage 3b: Secondary | ICD-10-CM | POA: Diagnosis not present

## 2023-05-26 DIAGNOSIS — Z902 Acquired absence of lung [part of]: Secondary | ICD-10-CM | POA: Diagnosis not present

## 2023-05-26 DIAGNOSIS — G5792 Unspecified mononeuropathy of left lower limb: Secondary | ICD-10-CM | POA: Diagnosis not present

## 2023-05-26 DIAGNOSIS — E782 Mixed hyperlipidemia: Secondary | ICD-10-CM | POA: Diagnosis not present

## 2023-05-26 DIAGNOSIS — E559 Vitamin D deficiency, unspecified: Secondary | ICD-10-CM | POA: Diagnosis not present

## 2023-05-26 DIAGNOSIS — R5381 Other malaise: Secondary | ICD-10-CM | POA: Diagnosis not present

## 2023-05-26 DIAGNOSIS — M179 Osteoarthritis of knee, unspecified: Secondary | ICD-10-CM | POA: Diagnosis not present

## 2023-05-26 DIAGNOSIS — G5791 Unspecified mononeuropathy of right lower limb: Secondary | ICD-10-CM | POA: Diagnosis not present

## 2023-05-26 DIAGNOSIS — F0390 Unspecified dementia without behavioral disturbance: Secondary | ICD-10-CM | POA: Diagnosis not present

## 2023-05-26 DIAGNOSIS — K5790 Diverticulosis of intestine, part unspecified, without perforation or abscess without bleeding: Secondary | ICD-10-CM | POA: Diagnosis not present

## 2023-05-26 DIAGNOSIS — C3490 Malignant neoplasm of unspecified part of unspecified bronchus or lung: Secondary | ICD-10-CM | POA: Diagnosis not present

## 2023-05-26 DIAGNOSIS — Z85118 Personal history of other malignant neoplasm of bronchus and lung: Secondary | ICD-10-CM | POA: Diagnosis not present

## 2023-05-26 DIAGNOSIS — E790 Hyperuricemia without signs of inflammatory arthritis and tophaceous disease: Secondary | ICD-10-CM | POA: Diagnosis not present

## 2023-05-26 DIAGNOSIS — I69354 Hemiplegia and hemiparesis following cerebral infarction affecting left non-dominant side: Secondary | ICD-10-CM | POA: Diagnosis not present

## 2023-05-26 DIAGNOSIS — G9340 Encephalopathy, unspecified: Secondary | ICD-10-CM | POA: Diagnosis not present

## 2023-05-26 DIAGNOSIS — Z9181 History of falling: Secondary | ICD-10-CM | POA: Diagnosis not present

## 2023-05-26 DIAGNOSIS — I679 Cerebrovascular disease, unspecified: Secondary | ICD-10-CM | POA: Diagnosis not present

## 2023-05-26 DIAGNOSIS — M153 Secondary multiple arthritis: Secondary | ICD-10-CM | POA: Diagnosis not present

## 2023-05-26 NOTE — Progress Notes (Signed)
[facility] Summary Report / Loop Recorder 

## 2023-05-28 DIAGNOSIS — I69398 Other sequelae of cerebral infarction: Secondary | ICD-10-CM | POA: Diagnosis not present

## 2023-05-28 DIAGNOSIS — I69319 Unspecified symptoms and signs involving cognitive functions following cerebral infarction: Secondary | ICD-10-CM | POA: Diagnosis not present

## 2023-05-28 DIAGNOSIS — I69354 Hemiplegia and hemiparesis following cerebral infarction affecting left non-dominant side: Secondary | ICD-10-CM | POA: Diagnosis not present

## 2023-05-28 DIAGNOSIS — F0390 Unspecified dementia without behavioral disturbance: Secondary | ICD-10-CM | POA: Diagnosis not present

## 2023-05-28 DIAGNOSIS — C3490 Malignant neoplasm of unspecified part of unspecified bronchus or lung: Secondary | ICD-10-CM | POA: Diagnosis not present

## 2023-05-28 DIAGNOSIS — J449 Chronic obstructive pulmonary disease, unspecified: Secondary | ICD-10-CM | POA: Diagnosis not present

## 2023-06-04 DIAGNOSIS — F0390 Unspecified dementia without behavioral disturbance: Secondary | ICD-10-CM | POA: Diagnosis not present

## 2023-06-04 DIAGNOSIS — J449 Chronic obstructive pulmonary disease, unspecified: Secondary | ICD-10-CM | POA: Diagnosis not present

## 2023-06-04 DIAGNOSIS — I69354 Hemiplegia and hemiparesis following cerebral infarction affecting left non-dominant side: Secondary | ICD-10-CM | POA: Diagnosis not present

## 2023-06-04 DIAGNOSIS — I69319 Unspecified symptoms and signs involving cognitive functions following cerebral infarction: Secondary | ICD-10-CM | POA: Diagnosis not present

## 2023-06-04 DIAGNOSIS — I69398 Other sequelae of cerebral infarction: Secondary | ICD-10-CM | POA: Diagnosis not present

## 2023-06-04 DIAGNOSIS — C3490 Malignant neoplasm of unspecified part of unspecified bronchus or lung: Secondary | ICD-10-CM | POA: Diagnosis not present

## 2023-06-05 DIAGNOSIS — I69398 Other sequelae of cerebral infarction: Secondary | ICD-10-CM | POA: Diagnosis not present

## 2023-06-05 DIAGNOSIS — F0390 Unspecified dementia without behavioral disturbance: Secondary | ICD-10-CM | POA: Diagnosis not present

## 2023-06-05 DIAGNOSIS — I69319 Unspecified symptoms and signs involving cognitive functions following cerebral infarction: Secondary | ICD-10-CM | POA: Diagnosis not present

## 2023-06-05 DIAGNOSIS — J449 Chronic obstructive pulmonary disease, unspecified: Secondary | ICD-10-CM | POA: Diagnosis not present

## 2023-06-05 DIAGNOSIS — I69354 Hemiplegia and hemiparesis following cerebral infarction affecting left non-dominant side: Secondary | ICD-10-CM | POA: Diagnosis not present

## 2023-06-05 DIAGNOSIS — C3490 Malignant neoplasm of unspecified part of unspecified bronchus or lung: Secondary | ICD-10-CM | POA: Diagnosis not present

## 2023-06-06 DIAGNOSIS — J449 Chronic obstructive pulmonary disease, unspecified: Secondary | ICD-10-CM | POA: Diagnosis not present

## 2023-06-06 DIAGNOSIS — F0390 Unspecified dementia without behavioral disturbance: Secondary | ICD-10-CM | POA: Diagnosis not present

## 2023-06-06 DIAGNOSIS — C3490 Malignant neoplasm of unspecified part of unspecified bronchus or lung: Secondary | ICD-10-CM | POA: Diagnosis not present

## 2023-06-06 DIAGNOSIS — I69398 Other sequelae of cerebral infarction: Secondary | ICD-10-CM | POA: Diagnosis not present

## 2023-06-06 DIAGNOSIS — I69354 Hemiplegia and hemiparesis following cerebral infarction affecting left non-dominant side: Secondary | ICD-10-CM | POA: Diagnosis not present

## 2023-06-06 DIAGNOSIS — I69319 Unspecified symptoms and signs involving cognitive functions following cerebral infarction: Secondary | ICD-10-CM | POA: Diagnosis not present

## 2023-06-09 ENCOUNTER — Ambulatory Visit: Payer: Medicare Other

## 2023-06-09 DIAGNOSIS — I639 Cerebral infarction, unspecified: Secondary | ICD-10-CM | POA: Diagnosis not present

## 2023-06-09 LAB — CUP PACEART REMOTE DEVICE CHECK
Date Time Interrogation Session: 20240712230354
Implantable Pulse Generator Implant Date: 20210719

## 2023-06-10 DIAGNOSIS — J449 Chronic obstructive pulmonary disease, unspecified: Secondary | ICD-10-CM | POA: Diagnosis not present

## 2023-06-10 DIAGNOSIS — F0390 Unspecified dementia without behavioral disturbance: Secondary | ICD-10-CM | POA: Diagnosis not present

## 2023-06-10 DIAGNOSIS — I69319 Unspecified symptoms and signs involving cognitive functions following cerebral infarction: Secondary | ICD-10-CM | POA: Diagnosis not present

## 2023-06-10 DIAGNOSIS — C3490 Malignant neoplasm of unspecified part of unspecified bronchus or lung: Secondary | ICD-10-CM | POA: Diagnosis not present

## 2023-06-10 DIAGNOSIS — I69398 Other sequelae of cerebral infarction: Secondary | ICD-10-CM | POA: Diagnosis not present

## 2023-06-10 DIAGNOSIS — I69354 Hemiplegia and hemiparesis following cerebral infarction affecting left non-dominant side: Secondary | ICD-10-CM | POA: Diagnosis not present

## 2023-06-11 DIAGNOSIS — I69354 Hemiplegia and hemiparesis following cerebral infarction affecting left non-dominant side: Secondary | ICD-10-CM | POA: Diagnosis not present

## 2023-06-11 DIAGNOSIS — I69398 Other sequelae of cerebral infarction: Secondary | ICD-10-CM | POA: Diagnosis not present

## 2023-06-11 DIAGNOSIS — I69319 Unspecified symptoms and signs involving cognitive functions following cerebral infarction: Secondary | ICD-10-CM | POA: Diagnosis not present

## 2023-06-11 DIAGNOSIS — C3490 Malignant neoplasm of unspecified part of unspecified bronchus or lung: Secondary | ICD-10-CM | POA: Diagnosis not present

## 2023-06-11 DIAGNOSIS — J449 Chronic obstructive pulmonary disease, unspecified: Secondary | ICD-10-CM | POA: Diagnosis not present

## 2023-06-11 DIAGNOSIS — F0390 Unspecified dementia without behavioral disturbance: Secondary | ICD-10-CM | POA: Diagnosis not present

## 2023-06-12 DIAGNOSIS — C3490 Malignant neoplasm of unspecified part of unspecified bronchus or lung: Secondary | ICD-10-CM | POA: Diagnosis not present

## 2023-06-12 DIAGNOSIS — J449 Chronic obstructive pulmonary disease, unspecified: Secondary | ICD-10-CM | POA: Diagnosis not present

## 2023-06-12 DIAGNOSIS — F0390 Unspecified dementia without behavioral disturbance: Secondary | ICD-10-CM | POA: Diagnosis not present

## 2023-06-12 DIAGNOSIS — I69319 Unspecified symptoms and signs involving cognitive functions following cerebral infarction: Secondary | ICD-10-CM | POA: Diagnosis not present

## 2023-06-12 DIAGNOSIS — I69354 Hemiplegia and hemiparesis following cerebral infarction affecting left non-dominant side: Secondary | ICD-10-CM | POA: Diagnosis not present

## 2023-06-12 DIAGNOSIS — I69398 Other sequelae of cerebral infarction: Secondary | ICD-10-CM | POA: Diagnosis not present

## 2023-06-18 DIAGNOSIS — I69319 Unspecified symptoms and signs involving cognitive functions following cerebral infarction: Secondary | ICD-10-CM | POA: Diagnosis not present

## 2023-06-18 DIAGNOSIS — I69398 Other sequelae of cerebral infarction: Secondary | ICD-10-CM | POA: Diagnosis not present

## 2023-06-18 DIAGNOSIS — F0390 Unspecified dementia without behavioral disturbance: Secondary | ICD-10-CM | POA: Diagnosis not present

## 2023-06-18 DIAGNOSIS — C3490 Malignant neoplasm of unspecified part of unspecified bronchus or lung: Secondary | ICD-10-CM | POA: Diagnosis not present

## 2023-06-18 DIAGNOSIS — I69354 Hemiplegia and hemiparesis following cerebral infarction affecting left non-dominant side: Secondary | ICD-10-CM | POA: Diagnosis not present

## 2023-06-18 DIAGNOSIS — J449 Chronic obstructive pulmonary disease, unspecified: Secondary | ICD-10-CM | POA: Diagnosis not present

## 2023-06-19 DIAGNOSIS — I69354 Hemiplegia and hemiparesis following cerebral infarction affecting left non-dominant side: Secondary | ICD-10-CM | POA: Diagnosis not present

## 2023-06-19 DIAGNOSIS — I69319 Unspecified symptoms and signs involving cognitive functions following cerebral infarction: Secondary | ICD-10-CM | POA: Diagnosis not present

## 2023-06-19 DIAGNOSIS — C3490 Malignant neoplasm of unspecified part of unspecified bronchus or lung: Secondary | ICD-10-CM | POA: Diagnosis not present

## 2023-06-19 DIAGNOSIS — J449 Chronic obstructive pulmonary disease, unspecified: Secondary | ICD-10-CM | POA: Diagnosis not present

## 2023-06-19 DIAGNOSIS — F0390 Unspecified dementia without behavioral disturbance: Secondary | ICD-10-CM | POA: Diagnosis not present

## 2023-06-19 DIAGNOSIS — I69398 Other sequelae of cerebral infarction: Secondary | ICD-10-CM | POA: Diagnosis not present

## 2023-06-20 NOTE — Progress Notes (Signed)
[facility] Summary Report / Loop Recorder 

## 2023-06-25 DIAGNOSIS — I69398 Other sequelae of cerebral infarction: Secondary | ICD-10-CM | POA: Diagnosis not present

## 2023-06-25 DIAGNOSIS — F0390 Unspecified dementia without behavioral disturbance: Secondary | ICD-10-CM | POA: Diagnosis not present

## 2023-06-25 DIAGNOSIS — J449 Chronic obstructive pulmonary disease, unspecified: Secondary | ICD-10-CM | POA: Diagnosis not present

## 2023-06-25 DIAGNOSIS — C3490 Malignant neoplasm of unspecified part of unspecified bronchus or lung: Secondary | ICD-10-CM | POA: Diagnosis not present

## 2023-06-25 DIAGNOSIS — I69319 Unspecified symptoms and signs involving cognitive functions following cerebral infarction: Secondary | ICD-10-CM | POA: Diagnosis not present

## 2023-06-25 DIAGNOSIS — I69354 Hemiplegia and hemiparesis following cerebral infarction affecting left non-dominant side: Secondary | ICD-10-CM | POA: Diagnosis not present

## 2023-06-26 DIAGNOSIS — E790 Hyperuricemia without signs of inflammatory arthritis and tophaceous disease: Secondary | ICD-10-CM | POA: Diagnosis not present

## 2023-06-26 DIAGNOSIS — K5790 Diverticulosis of intestine, part unspecified, without perforation or abscess without bleeding: Secondary | ICD-10-CM | POA: Diagnosis not present

## 2023-06-26 DIAGNOSIS — N1832 Chronic kidney disease, stage 3b: Secondary | ICD-10-CM | POA: Diagnosis not present

## 2023-06-26 DIAGNOSIS — Z85118 Personal history of other malignant neoplasm of bronchus and lung: Secondary | ICD-10-CM | POA: Diagnosis not present

## 2023-06-26 DIAGNOSIS — G9340 Encephalopathy, unspecified: Secondary | ICD-10-CM | POA: Diagnosis not present

## 2023-06-26 DIAGNOSIS — F0390 Unspecified dementia without behavioral disturbance: Secondary | ICD-10-CM | POA: Diagnosis not present

## 2023-06-26 DIAGNOSIS — I69354 Hemiplegia and hemiparesis following cerebral infarction affecting left non-dominant side: Secondary | ICD-10-CM | POA: Diagnosis not present

## 2023-06-26 DIAGNOSIS — M179 Osteoarthritis of knee, unspecified: Secondary | ICD-10-CM | POA: Diagnosis not present

## 2023-06-26 DIAGNOSIS — G5792 Unspecified mononeuropathy of left lower limb: Secondary | ICD-10-CM | POA: Diagnosis not present

## 2023-06-26 DIAGNOSIS — E782 Mixed hyperlipidemia: Secondary | ICD-10-CM | POA: Diagnosis not present

## 2023-06-26 DIAGNOSIS — E559 Vitamin D deficiency, unspecified: Secondary | ICD-10-CM | POA: Diagnosis not present

## 2023-06-26 DIAGNOSIS — J449 Chronic obstructive pulmonary disease, unspecified: Secondary | ICD-10-CM | POA: Diagnosis not present

## 2023-06-26 DIAGNOSIS — G5791 Unspecified mononeuropathy of right lower limb: Secondary | ICD-10-CM | POA: Diagnosis not present

## 2023-06-26 DIAGNOSIS — I69319 Unspecified symptoms and signs involving cognitive functions following cerebral infarction: Secondary | ICD-10-CM | POA: Diagnosis not present

## 2023-06-26 DIAGNOSIS — R5381 Other malaise: Secondary | ICD-10-CM | POA: Diagnosis not present

## 2023-06-26 DIAGNOSIS — I129 Hypertensive chronic kidney disease with stage 1 through stage 4 chronic kidney disease, or unspecified chronic kidney disease: Secondary | ICD-10-CM | POA: Diagnosis not present

## 2023-06-26 DIAGNOSIS — Z902 Acquired absence of lung [part of]: Secondary | ICD-10-CM | POA: Diagnosis not present

## 2023-06-26 DIAGNOSIS — I679 Cerebrovascular disease, unspecified: Secondary | ICD-10-CM | POA: Diagnosis not present

## 2023-06-26 DIAGNOSIS — R451 Restlessness and agitation: Secondary | ICD-10-CM | POA: Diagnosis not present

## 2023-06-26 DIAGNOSIS — M153 Secondary multiple arthritis: Secondary | ICD-10-CM | POA: Diagnosis not present

## 2023-06-26 DIAGNOSIS — F329 Major depressive disorder, single episode, unspecified: Secondary | ICD-10-CM | POA: Diagnosis not present

## 2023-06-26 DIAGNOSIS — J310 Chronic rhinitis: Secondary | ICD-10-CM | POA: Diagnosis not present

## 2023-06-26 DIAGNOSIS — C3490 Malignant neoplasm of unspecified part of unspecified bronchus or lung: Secondary | ICD-10-CM | POA: Diagnosis not present

## 2023-06-26 DIAGNOSIS — I69398 Other sequelae of cerebral infarction: Secondary | ICD-10-CM | POA: Diagnosis not present

## 2023-06-26 DIAGNOSIS — Z9181 History of falling: Secondary | ICD-10-CM | POA: Diagnosis not present

## 2023-07-02 DIAGNOSIS — C3490 Malignant neoplasm of unspecified part of unspecified bronchus or lung: Secondary | ICD-10-CM | POA: Diagnosis not present

## 2023-07-02 DIAGNOSIS — F0390 Unspecified dementia without behavioral disturbance: Secondary | ICD-10-CM | POA: Diagnosis not present

## 2023-07-02 DIAGNOSIS — I69398 Other sequelae of cerebral infarction: Secondary | ICD-10-CM | POA: Diagnosis not present

## 2023-07-02 DIAGNOSIS — I69319 Unspecified symptoms and signs involving cognitive functions following cerebral infarction: Secondary | ICD-10-CM | POA: Diagnosis not present

## 2023-07-02 DIAGNOSIS — I69354 Hemiplegia and hemiparesis following cerebral infarction affecting left non-dominant side: Secondary | ICD-10-CM | POA: Diagnosis not present

## 2023-07-02 DIAGNOSIS — J449 Chronic obstructive pulmonary disease, unspecified: Secondary | ICD-10-CM | POA: Diagnosis not present

## 2023-07-03 DIAGNOSIS — I69354 Hemiplegia and hemiparesis following cerebral infarction affecting left non-dominant side: Secondary | ICD-10-CM | POA: Diagnosis not present

## 2023-07-03 DIAGNOSIS — F0390 Unspecified dementia without behavioral disturbance: Secondary | ICD-10-CM | POA: Diagnosis not present

## 2023-07-03 DIAGNOSIS — J449 Chronic obstructive pulmonary disease, unspecified: Secondary | ICD-10-CM | POA: Diagnosis not present

## 2023-07-03 DIAGNOSIS — I69398 Other sequelae of cerebral infarction: Secondary | ICD-10-CM | POA: Diagnosis not present

## 2023-07-03 DIAGNOSIS — C3490 Malignant neoplasm of unspecified part of unspecified bronchus or lung: Secondary | ICD-10-CM | POA: Diagnosis not present

## 2023-07-03 DIAGNOSIS — I69319 Unspecified symptoms and signs involving cognitive functions following cerebral infarction: Secondary | ICD-10-CM | POA: Diagnosis not present

## 2023-07-04 DIAGNOSIS — I69398 Other sequelae of cerebral infarction: Secondary | ICD-10-CM | POA: Diagnosis not present

## 2023-07-04 DIAGNOSIS — F0390 Unspecified dementia without behavioral disturbance: Secondary | ICD-10-CM | POA: Diagnosis not present

## 2023-07-04 DIAGNOSIS — I69354 Hemiplegia and hemiparesis following cerebral infarction affecting left non-dominant side: Secondary | ICD-10-CM | POA: Diagnosis not present

## 2023-07-04 DIAGNOSIS — C3490 Malignant neoplasm of unspecified part of unspecified bronchus or lung: Secondary | ICD-10-CM | POA: Diagnosis not present

## 2023-07-04 DIAGNOSIS — J449 Chronic obstructive pulmonary disease, unspecified: Secondary | ICD-10-CM | POA: Diagnosis not present

## 2023-07-04 DIAGNOSIS — I69319 Unspecified symptoms and signs involving cognitive functions following cerebral infarction: Secondary | ICD-10-CM | POA: Diagnosis not present

## 2023-07-09 DIAGNOSIS — F0390 Unspecified dementia without behavioral disturbance: Secondary | ICD-10-CM | POA: Diagnosis not present

## 2023-07-09 DIAGNOSIS — C3490 Malignant neoplasm of unspecified part of unspecified bronchus or lung: Secondary | ICD-10-CM | POA: Diagnosis not present

## 2023-07-09 DIAGNOSIS — I69354 Hemiplegia and hemiparesis following cerebral infarction affecting left non-dominant side: Secondary | ICD-10-CM | POA: Diagnosis not present

## 2023-07-09 DIAGNOSIS — J449 Chronic obstructive pulmonary disease, unspecified: Secondary | ICD-10-CM | POA: Diagnosis not present

## 2023-07-09 DIAGNOSIS — I69398 Other sequelae of cerebral infarction: Secondary | ICD-10-CM | POA: Diagnosis not present

## 2023-07-09 DIAGNOSIS — I69319 Unspecified symptoms and signs involving cognitive functions following cerebral infarction: Secondary | ICD-10-CM | POA: Diagnosis not present

## 2023-07-10 DIAGNOSIS — I69398 Other sequelae of cerebral infarction: Secondary | ICD-10-CM | POA: Diagnosis not present

## 2023-07-10 DIAGNOSIS — I69354 Hemiplegia and hemiparesis following cerebral infarction affecting left non-dominant side: Secondary | ICD-10-CM | POA: Diagnosis not present

## 2023-07-10 DIAGNOSIS — J449 Chronic obstructive pulmonary disease, unspecified: Secondary | ICD-10-CM | POA: Diagnosis not present

## 2023-07-10 DIAGNOSIS — F0390 Unspecified dementia without behavioral disturbance: Secondary | ICD-10-CM | POA: Diagnosis not present

## 2023-07-10 DIAGNOSIS — I69319 Unspecified symptoms and signs involving cognitive functions following cerebral infarction: Secondary | ICD-10-CM | POA: Diagnosis not present

## 2023-07-10 DIAGNOSIS — C3490 Malignant neoplasm of unspecified part of unspecified bronchus or lung: Secondary | ICD-10-CM | POA: Diagnosis not present

## 2023-07-11 LAB — CUP PACEART REMOTE DEVICE CHECK
Date Time Interrogation Session: 20240816092157
Implantable Pulse Generator Implant Date: 20210719

## 2023-07-14 ENCOUNTER — Ambulatory Visit (INDEPENDENT_AMBULATORY_CARE_PROVIDER_SITE_OTHER): Payer: Medicare Other

## 2023-07-14 DIAGNOSIS — I639 Cerebral infarction, unspecified: Secondary | ICD-10-CM

## 2023-07-16 DIAGNOSIS — I69319 Unspecified symptoms and signs involving cognitive functions following cerebral infarction: Secondary | ICD-10-CM | POA: Diagnosis not present

## 2023-07-16 DIAGNOSIS — I69354 Hemiplegia and hemiparesis following cerebral infarction affecting left non-dominant side: Secondary | ICD-10-CM | POA: Diagnosis not present

## 2023-07-16 DIAGNOSIS — F0390 Unspecified dementia without behavioral disturbance: Secondary | ICD-10-CM | POA: Diagnosis not present

## 2023-07-16 DIAGNOSIS — I69398 Other sequelae of cerebral infarction: Secondary | ICD-10-CM | POA: Diagnosis not present

## 2023-07-16 DIAGNOSIS — C3490 Malignant neoplasm of unspecified part of unspecified bronchus or lung: Secondary | ICD-10-CM | POA: Diagnosis not present

## 2023-07-16 DIAGNOSIS — J449 Chronic obstructive pulmonary disease, unspecified: Secondary | ICD-10-CM | POA: Diagnosis not present

## 2023-07-23 NOTE — Progress Notes (Signed)
[facility] Summary Report / Loop Recorder 

## 2023-08-12 DIAGNOSIS — C3490 Malignant neoplasm of unspecified part of unspecified bronchus or lung: Secondary | ICD-10-CM | POA: Diagnosis not present

## 2023-08-12 DIAGNOSIS — I69354 Hemiplegia and hemiparesis following cerebral infarction affecting left non-dominant side: Secondary | ICD-10-CM | POA: Diagnosis not present

## 2023-08-12 DIAGNOSIS — K5790 Diverticulosis of intestine, part unspecified, without perforation or abscess without bleeding: Secondary | ICD-10-CM | POA: Diagnosis not present

## 2023-08-12 DIAGNOSIS — E559 Vitamin D deficiency, unspecified: Secondary | ICD-10-CM | POA: Diagnosis not present

## 2023-08-12 DIAGNOSIS — G5791 Unspecified mononeuropathy of right lower limb: Secondary | ICD-10-CM | POA: Diagnosis not present

## 2023-08-12 DIAGNOSIS — F0153 Vascular dementia, unspecified severity, with mood disturbance: Secondary | ICD-10-CM | POA: Diagnosis not present

## 2023-08-12 DIAGNOSIS — E782 Mixed hyperlipidemia: Secondary | ICD-10-CM | POA: Diagnosis not present

## 2023-08-12 DIAGNOSIS — J310 Chronic rhinitis: Secondary | ICD-10-CM | POA: Diagnosis not present

## 2023-08-12 DIAGNOSIS — E039 Hypothyroidism, unspecified: Secondary | ICD-10-CM | POA: Diagnosis not present

## 2023-08-12 DIAGNOSIS — I129 Hypertensive chronic kidney disease with stage 1 through stage 4 chronic kidney disease, or unspecified chronic kidney disease: Secondary | ICD-10-CM | POA: Diagnosis not present

## 2023-08-12 DIAGNOSIS — M153 Secondary multiple arthritis: Secondary | ICD-10-CM | POA: Diagnosis not present

## 2023-08-12 DIAGNOSIS — Z9181 History of falling: Secondary | ICD-10-CM | POA: Diagnosis not present

## 2023-08-12 DIAGNOSIS — N1832 Chronic kidney disease, stage 3b: Secondary | ICD-10-CM | POA: Diagnosis not present

## 2023-08-12 DIAGNOSIS — I69318 Other symptoms and signs involving cognitive functions following cerebral infarction: Secondary | ICD-10-CM | POA: Diagnosis not present

## 2023-08-12 DIAGNOSIS — G309 Alzheimer's disease, unspecified: Secondary | ICD-10-CM | POA: Diagnosis not present

## 2023-08-12 DIAGNOSIS — F32A Depression, unspecified: Secondary | ICD-10-CM | POA: Diagnosis not present

## 2023-08-12 DIAGNOSIS — Z902 Acquired absence of lung [part of]: Secondary | ICD-10-CM | POA: Diagnosis not present

## 2023-08-12 DIAGNOSIS — Z8673 Personal history of transient ischemic attack (TIA), and cerebral infarction without residual deficits: Secondary | ICD-10-CM | POA: Diagnosis not present

## 2023-08-12 DIAGNOSIS — J449 Chronic obstructive pulmonary disease, unspecified: Secondary | ICD-10-CM | POA: Diagnosis not present

## 2023-08-12 DIAGNOSIS — F0283 Dementia in other diseases classified elsewhere, unspecified severity, with mood disturbance: Secondary | ICD-10-CM | POA: Diagnosis not present

## 2023-08-15 DIAGNOSIS — Z9181 History of falling: Secondary | ICD-10-CM | POA: Diagnosis not present

## 2023-08-15 DIAGNOSIS — J984 Other disorders of lung: Secondary | ICD-10-CM | POA: Diagnosis not present

## 2023-08-15 DIAGNOSIS — I509 Heart failure, unspecified: Secondary | ICD-10-CM | POA: Diagnosis not present

## 2023-08-15 DIAGNOSIS — J90 Pleural effusion, not elsewhere classified: Secondary | ICD-10-CM | POA: Diagnosis not present

## 2023-08-18 ENCOUNTER — Ambulatory Visit: Payer: Medicare Other

## 2023-08-18 DIAGNOSIS — J449 Chronic obstructive pulmonary disease, unspecified: Secondary | ICD-10-CM | POA: Diagnosis not present

## 2023-08-18 DIAGNOSIS — N1832 Chronic kidney disease, stage 3b: Secondary | ICD-10-CM | POA: Diagnosis not present

## 2023-08-18 DIAGNOSIS — I639 Cerebral infarction, unspecified: Secondary | ICD-10-CM

## 2023-08-18 DIAGNOSIS — C3490 Malignant neoplasm of unspecified part of unspecified bronchus or lung: Secondary | ICD-10-CM | POA: Diagnosis not present

## 2023-08-18 DIAGNOSIS — E782 Mixed hyperlipidemia: Secondary | ICD-10-CM | POA: Diagnosis not present

## 2023-08-18 DIAGNOSIS — I69354 Hemiplegia and hemiparesis following cerebral infarction affecting left non-dominant side: Secondary | ICD-10-CM | POA: Diagnosis not present

## 2023-08-18 DIAGNOSIS — I129 Hypertensive chronic kidney disease with stage 1 through stage 4 chronic kidney disease, or unspecified chronic kidney disease: Secondary | ICD-10-CM | POA: Diagnosis not present

## 2023-08-20 DIAGNOSIS — N1832 Chronic kidney disease, stage 3b: Secondary | ICD-10-CM | POA: Diagnosis not present

## 2023-08-20 DIAGNOSIS — F329 Major depressive disorder, single episode, unspecified: Secondary | ICD-10-CM | POA: Diagnosis not present

## 2023-08-20 DIAGNOSIS — J90 Pleural effusion, not elsewhere classified: Secondary | ICD-10-CM | POA: Diagnosis not present

## 2023-08-20 DIAGNOSIS — I509 Heart failure, unspecified: Secondary | ICD-10-CM | POA: Diagnosis not present

## 2023-08-20 DIAGNOSIS — E782 Mixed hyperlipidemia: Secondary | ICD-10-CM | POA: Diagnosis not present

## 2023-08-20 DIAGNOSIS — G5791 Unspecified mononeuropathy of right lower limb: Secondary | ICD-10-CM | POA: Diagnosis not present

## 2023-08-20 DIAGNOSIS — E039 Hypothyroidism, unspecified: Secondary | ICD-10-CM | POA: Diagnosis not present

## 2023-08-20 DIAGNOSIS — I679 Cerebrovascular disease, unspecified: Secondary | ICD-10-CM | POA: Diagnosis not present

## 2023-08-20 DIAGNOSIS — J449 Chronic obstructive pulmonary disease, unspecified: Secondary | ICD-10-CM | POA: Diagnosis not present

## 2023-08-20 DIAGNOSIS — I10 Essential (primary) hypertension: Secondary | ICD-10-CM | POA: Diagnosis not present

## 2023-08-20 DIAGNOSIS — I69319 Unspecified symptoms and signs involving cognitive functions following cerebral infarction: Secondary | ICD-10-CM | POA: Diagnosis not present

## 2023-08-20 DIAGNOSIS — C3490 Malignant neoplasm of unspecified part of unspecified bronchus or lung: Secondary | ICD-10-CM | POA: Diagnosis not present

## 2023-08-22 DIAGNOSIS — I129 Hypertensive chronic kidney disease with stage 1 through stage 4 chronic kidney disease, or unspecified chronic kidney disease: Secondary | ICD-10-CM | POA: Diagnosis not present

## 2023-08-22 DIAGNOSIS — I69354 Hemiplegia and hemiparesis following cerebral infarction affecting left non-dominant side: Secondary | ICD-10-CM | POA: Diagnosis not present

## 2023-08-22 DIAGNOSIS — E782 Mixed hyperlipidemia: Secondary | ICD-10-CM | POA: Diagnosis not present

## 2023-08-22 DIAGNOSIS — C3490 Malignant neoplasm of unspecified part of unspecified bronchus or lung: Secondary | ICD-10-CM | POA: Diagnosis not present

## 2023-08-22 DIAGNOSIS — J449 Chronic obstructive pulmonary disease, unspecified: Secondary | ICD-10-CM | POA: Diagnosis not present

## 2023-08-22 DIAGNOSIS — N1832 Chronic kidney disease, stage 3b: Secondary | ICD-10-CM | POA: Diagnosis not present

## 2023-08-25 DIAGNOSIS — N1832 Chronic kidney disease, stage 3b: Secondary | ICD-10-CM | POA: Diagnosis not present

## 2023-08-25 DIAGNOSIS — I69354 Hemiplegia and hemiparesis following cerebral infarction affecting left non-dominant side: Secondary | ICD-10-CM | POA: Diagnosis not present

## 2023-08-25 DIAGNOSIS — E782 Mixed hyperlipidemia: Secondary | ICD-10-CM | POA: Diagnosis not present

## 2023-08-25 DIAGNOSIS — I129 Hypertensive chronic kidney disease with stage 1 through stage 4 chronic kidney disease, or unspecified chronic kidney disease: Secondary | ICD-10-CM | POA: Diagnosis not present

## 2023-08-25 DIAGNOSIS — J449 Chronic obstructive pulmonary disease, unspecified: Secondary | ICD-10-CM | POA: Diagnosis not present

## 2023-08-25 DIAGNOSIS — C3490 Malignant neoplasm of unspecified part of unspecified bronchus or lung: Secondary | ICD-10-CM | POA: Diagnosis not present

## 2023-08-26 DIAGNOSIS — F02B0 Dementia in other diseases classified elsewhere, moderate, without behavioral disturbance, psychotic disturbance, mood disturbance, and anxiety: Secondary | ICD-10-CM | POA: Diagnosis not present

## 2023-08-28 NOTE — Progress Notes (Signed)
[facility] Summary Report / Loop Recorder 

## 2023-09-03 DIAGNOSIS — E782 Mixed hyperlipidemia: Secondary | ICD-10-CM | POA: Diagnosis not present

## 2023-09-03 DIAGNOSIS — J449 Chronic obstructive pulmonary disease, unspecified: Secondary | ICD-10-CM | POA: Diagnosis not present

## 2023-09-03 DIAGNOSIS — N1832 Chronic kidney disease, stage 3b: Secondary | ICD-10-CM | POA: Diagnosis not present

## 2023-09-03 DIAGNOSIS — I69354 Hemiplegia and hemiparesis following cerebral infarction affecting left non-dominant side: Secondary | ICD-10-CM | POA: Diagnosis not present

## 2023-09-03 DIAGNOSIS — C3490 Malignant neoplasm of unspecified part of unspecified bronchus or lung: Secondary | ICD-10-CM | POA: Diagnosis not present

## 2023-09-03 DIAGNOSIS — I129 Hypertensive chronic kidney disease with stage 1 through stage 4 chronic kidney disease, or unspecified chronic kidney disease: Secondary | ICD-10-CM | POA: Diagnosis not present

## 2023-09-04 DIAGNOSIS — C3490 Malignant neoplasm of unspecified part of unspecified bronchus or lung: Secondary | ICD-10-CM | POA: Diagnosis not present

## 2023-09-04 DIAGNOSIS — E782 Mixed hyperlipidemia: Secondary | ICD-10-CM | POA: Diagnosis not present

## 2023-09-04 DIAGNOSIS — N1832 Chronic kidney disease, stage 3b: Secondary | ICD-10-CM | POA: Diagnosis not present

## 2023-09-04 DIAGNOSIS — J449 Chronic obstructive pulmonary disease, unspecified: Secondary | ICD-10-CM | POA: Diagnosis not present

## 2023-09-04 DIAGNOSIS — I129 Hypertensive chronic kidney disease with stage 1 through stage 4 chronic kidney disease, or unspecified chronic kidney disease: Secondary | ICD-10-CM | POA: Diagnosis not present

## 2023-09-04 DIAGNOSIS — I69354 Hemiplegia and hemiparesis following cerebral infarction affecting left non-dominant side: Secondary | ICD-10-CM | POA: Diagnosis not present

## 2023-09-09 DIAGNOSIS — J449 Chronic obstructive pulmonary disease, unspecified: Secondary | ICD-10-CM | POA: Diagnosis not present

## 2023-09-09 DIAGNOSIS — I69354 Hemiplegia and hemiparesis following cerebral infarction affecting left non-dominant side: Secondary | ICD-10-CM | POA: Diagnosis not present

## 2023-09-09 DIAGNOSIS — E782 Mixed hyperlipidemia: Secondary | ICD-10-CM | POA: Diagnosis not present

## 2023-09-09 DIAGNOSIS — I129 Hypertensive chronic kidney disease with stage 1 through stage 4 chronic kidney disease, or unspecified chronic kidney disease: Secondary | ICD-10-CM | POA: Diagnosis not present

## 2023-09-09 DIAGNOSIS — N1832 Chronic kidney disease, stage 3b: Secondary | ICD-10-CM | POA: Diagnosis not present

## 2023-09-09 DIAGNOSIS — C3490 Malignant neoplasm of unspecified part of unspecified bronchus or lung: Secondary | ICD-10-CM | POA: Diagnosis not present

## 2023-09-11 DIAGNOSIS — J449 Chronic obstructive pulmonary disease, unspecified: Secondary | ICD-10-CM | POA: Diagnosis not present

## 2023-09-11 DIAGNOSIS — Z902 Acquired absence of lung [part of]: Secondary | ICD-10-CM | POA: Diagnosis not present

## 2023-09-11 DIAGNOSIS — M153 Secondary multiple arthritis: Secondary | ICD-10-CM | POA: Diagnosis not present

## 2023-09-11 DIAGNOSIS — F32A Depression, unspecified: Secondary | ICD-10-CM | POA: Diagnosis not present

## 2023-09-11 DIAGNOSIS — Z9181 History of falling: Secondary | ICD-10-CM | POA: Diagnosis not present

## 2023-09-11 DIAGNOSIS — I129 Hypertensive chronic kidney disease with stage 1 through stage 4 chronic kidney disease, or unspecified chronic kidney disease: Secondary | ICD-10-CM | POA: Diagnosis not present

## 2023-09-11 DIAGNOSIS — F0153 Vascular dementia, unspecified severity, with mood disturbance: Secondary | ICD-10-CM | POA: Diagnosis not present

## 2023-09-11 DIAGNOSIS — I69318 Other symptoms and signs involving cognitive functions following cerebral infarction: Secondary | ICD-10-CM | POA: Diagnosis not present

## 2023-09-11 DIAGNOSIS — G309 Alzheimer's disease, unspecified: Secondary | ICD-10-CM | POA: Diagnosis not present

## 2023-09-11 DIAGNOSIS — Z8673 Personal history of transient ischemic attack (TIA), and cerebral infarction without residual deficits: Secondary | ICD-10-CM | POA: Diagnosis not present

## 2023-09-11 DIAGNOSIS — K5790 Diverticulosis of intestine, part unspecified, without perforation or abscess without bleeding: Secondary | ICD-10-CM | POA: Diagnosis not present

## 2023-09-11 DIAGNOSIS — N1832 Chronic kidney disease, stage 3b: Secondary | ICD-10-CM | POA: Diagnosis not present

## 2023-09-11 DIAGNOSIS — E782 Mixed hyperlipidemia: Secondary | ICD-10-CM | POA: Diagnosis not present

## 2023-09-11 DIAGNOSIS — G5791 Unspecified mononeuropathy of right lower limb: Secondary | ICD-10-CM | POA: Diagnosis not present

## 2023-09-11 DIAGNOSIS — E559 Vitamin D deficiency, unspecified: Secondary | ICD-10-CM | POA: Diagnosis not present

## 2023-09-11 DIAGNOSIS — J310 Chronic rhinitis: Secondary | ICD-10-CM | POA: Diagnosis not present

## 2023-09-11 DIAGNOSIS — I69354 Hemiplegia and hemiparesis following cerebral infarction affecting left non-dominant side: Secondary | ICD-10-CM | POA: Diagnosis not present

## 2023-09-11 DIAGNOSIS — C3490 Malignant neoplasm of unspecified part of unspecified bronchus or lung: Secondary | ICD-10-CM | POA: Diagnosis not present

## 2023-09-11 DIAGNOSIS — F0283 Dementia in other diseases classified elsewhere, unspecified severity, with mood disturbance: Secondary | ICD-10-CM | POA: Diagnosis not present

## 2023-09-11 DIAGNOSIS — E039 Hypothyroidism, unspecified: Secondary | ICD-10-CM | POA: Diagnosis not present

## 2023-09-16 DIAGNOSIS — C3490 Malignant neoplasm of unspecified part of unspecified bronchus or lung: Secondary | ICD-10-CM | POA: Diagnosis not present

## 2023-09-16 DIAGNOSIS — E782 Mixed hyperlipidemia: Secondary | ICD-10-CM | POA: Diagnosis not present

## 2023-09-16 DIAGNOSIS — I129 Hypertensive chronic kidney disease with stage 1 through stage 4 chronic kidney disease, or unspecified chronic kidney disease: Secondary | ICD-10-CM | POA: Diagnosis not present

## 2023-09-16 DIAGNOSIS — N1832 Chronic kidney disease, stage 3b: Secondary | ICD-10-CM | POA: Diagnosis not present

## 2023-09-16 DIAGNOSIS — I69354 Hemiplegia and hemiparesis following cerebral infarction affecting left non-dominant side: Secondary | ICD-10-CM | POA: Diagnosis not present

## 2023-09-16 DIAGNOSIS — J449 Chronic obstructive pulmonary disease, unspecified: Secondary | ICD-10-CM | POA: Diagnosis not present

## 2023-09-19 DIAGNOSIS — I69354 Hemiplegia and hemiparesis following cerebral infarction affecting left non-dominant side: Secondary | ICD-10-CM | POA: Diagnosis not present

## 2023-09-19 DIAGNOSIS — I129 Hypertensive chronic kidney disease with stage 1 through stage 4 chronic kidney disease, or unspecified chronic kidney disease: Secondary | ICD-10-CM | POA: Diagnosis not present

## 2023-09-19 DIAGNOSIS — C3490 Malignant neoplasm of unspecified part of unspecified bronchus or lung: Secondary | ICD-10-CM | POA: Diagnosis not present

## 2023-09-19 DIAGNOSIS — N1832 Chronic kidney disease, stage 3b: Secondary | ICD-10-CM | POA: Diagnosis not present

## 2023-09-19 DIAGNOSIS — E782 Mixed hyperlipidemia: Secondary | ICD-10-CM | POA: Diagnosis not present

## 2023-09-19 DIAGNOSIS — J449 Chronic obstructive pulmonary disease, unspecified: Secondary | ICD-10-CM | POA: Diagnosis not present

## 2023-09-22 ENCOUNTER — Ambulatory Visit (INDEPENDENT_AMBULATORY_CARE_PROVIDER_SITE_OTHER): Payer: Medicare Other

## 2023-09-22 DIAGNOSIS — N1832 Chronic kidney disease, stage 3b: Secondary | ICD-10-CM | POA: Diagnosis not present

## 2023-09-22 DIAGNOSIS — I69354 Hemiplegia and hemiparesis following cerebral infarction affecting left non-dominant side: Secondary | ICD-10-CM | POA: Diagnosis not present

## 2023-09-22 DIAGNOSIS — I129 Hypertensive chronic kidney disease with stage 1 through stage 4 chronic kidney disease, or unspecified chronic kidney disease: Secondary | ICD-10-CM | POA: Diagnosis not present

## 2023-09-22 DIAGNOSIS — E782 Mixed hyperlipidemia: Secondary | ICD-10-CM | POA: Diagnosis not present

## 2023-09-22 DIAGNOSIS — J449 Chronic obstructive pulmonary disease, unspecified: Secondary | ICD-10-CM | POA: Diagnosis not present

## 2023-09-22 DIAGNOSIS — C3490 Malignant neoplasm of unspecified part of unspecified bronchus or lung: Secondary | ICD-10-CM | POA: Diagnosis not present

## 2023-09-22 DIAGNOSIS — I639 Cerebral infarction, unspecified: Secondary | ICD-10-CM

## 2023-09-23 LAB — CUP PACEART REMOTE DEVICE CHECK
Date Time Interrogation Session: 20241027230138
Implantable Pulse Generator Implant Date: 20210719

## 2023-09-26 DIAGNOSIS — F02B0 Dementia in other diseases classified elsewhere, moderate, without behavioral disturbance, psychotic disturbance, mood disturbance, and anxiety: Secondary | ICD-10-CM | POA: Diagnosis not present

## 2023-10-08 NOTE — Progress Notes (Signed)
[facility] Summary Report / Loop Recorder 

## 2023-10-09 DIAGNOSIS — N1832 Chronic kidney disease, stage 3b: Secondary | ICD-10-CM | POA: Diagnosis not present

## 2023-10-09 DIAGNOSIS — C3490 Malignant neoplasm of unspecified part of unspecified bronchus or lung: Secondary | ICD-10-CM | POA: Diagnosis not present

## 2023-10-09 DIAGNOSIS — E782 Mixed hyperlipidemia: Secondary | ICD-10-CM | POA: Diagnosis not present

## 2023-10-09 DIAGNOSIS — J449 Chronic obstructive pulmonary disease, unspecified: Secondary | ICD-10-CM | POA: Diagnosis not present

## 2023-10-09 DIAGNOSIS — I69354 Hemiplegia and hemiparesis following cerebral infarction affecting left non-dominant side: Secondary | ICD-10-CM | POA: Diagnosis not present

## 2023-10-09 DIAGNOSIS — I129 Hypertensive chronic kidney disease with stage 1 through stage 4 chronic kidney disease, or unspecified chronic kidney disease: Secondary | ICD-10-CM | POA: Diagnosis not present

## 2023-10-11 DIAGNOSIS — E782 Mixed hyperlipidemia: Secondary | ICD-10-CM | POA: Diagnosis not present

## 2023-10-11 DIAGNOSIS — N1832 Chronic kidney disease, stage 3b: Secondary | ICD-10-CM | POA: Diagnosis not present

## 2023-10-11 DIAGNOSIS — C3490 Malignant neoplasm of unspecified part of unspecified bronchus or lung: Secondary | ICD-10-CM | POA: Diagnosis not present

## 2023-10-11 DIAGNOSIS — F0153 Vascular dementia, unspecified severity, with mood disturbance: Secondary | ICD-10-CM | POA: Diagnosis not present

## 2023-10-11 DIAGNOSIS — E039 Hypothyroidism, unspecified: Secondary | ICD-10-CM | POA: Diagnosis not present

## 2023-10-11 DIAGNOSIS — I69318 Other symptoms and signs involving cognitive functions following cerebral infarction: Secondary | ICD-10-CM | POA: Diagnosis not present

## 2023-10-11 DIAGNOSIS — Z902 Acquired absence of lung [part of]: Secondary | ICD-10-CM | POA: Diagnosis not present

## 2023-10-11 DIAGNOSIS — I129 Hypertensive chronic kidney disease with stage 1 through stage 4 chronic kidney disease, or unspecified chronic kidney disease: Secondary | ICD-10-CM | POA: Diagnosis not present

## 2023-10-11 DIAGNOSIS — K5790 Diverticulosis of intestine, part unspecified, without perforation or abscess without bleeding: Secondary | ICD-10-CM | POA: Diagnosis not present

## 2023-10-11 DIAGNOSIS — I69354 Hemiplegia and hemiparesis following cerebral infarction affecting left non-dominant side: Secondary | ICD-10-CM | POA: Diagnosis not present

## 2023-10-11 DIAGNOSIS — M153 Secondary multiple arthritis: Secondary | ICD-10-CM | POA: Diagnosis not present

## 2023-10-11 DIAGNOSIS — F0283 Dementia in other diseases classified elsewhere, unspecified severity, with mood disturbance: Secondary | ICD-10-CM | POA: Diagnosis not present

## 2023-10-11 DIAGNOSIS — Z602 Problems related to living alone: Secondary | ICD-10-CM | POA: Diagnosis not present

## 2023-10-11 DIAGNOSIS — F32A Depression, unspecified: Secondary | ICD-10-CM | POA: Diagnosis not present

## 2023-10-11 DIAGNOSIS — Z8673 Personal history of transient ischemic attack (TIA), and cerebral infarction without residual deficits: Secondary | ICD-10-CM | POA: Diagnosis not present

## 2023-10-11 DIAGNOSIS — J310 Chronic rhinitis: Secondary | ICD-10-CM | POA: Diagnosis not present

## 2023-10-11 DIAGNOSIS — G5791 Unspecified mononeuropathy of right lower limb: Secondary | ICD-10-CM | POA: Diagnosis not present

## 2023-10-11 DIAGNOSIS — E559 Vitamin D deficiency, unspecified: Secondary | ICD-10-CM | POA: Diagnosis not present

## 2023-10-11 DIAGNOSIS — Z9181 History of falling: Secondary | ICD-10-CM | POA: Diagnosis not present

## 2023-10-11 DIAGNOSIS — G309 Alzheimer's disease, unspecified: Secondary | ICD-10-CM | POA: Diagnosis not present

## 2023-10-11 DIAGNOSIS — J449 Chronic obstructive pulmonary disease, unspecified: Secondary | ICD-10-CM | POA: Diagnosis not present

## 2023-10-12 NOTE — Progress Notes (Deleted)
Cardiology Office Note:    Date:  10/12/2023   ID:  Valerie Young, DOB [birth year], MRN [id number]  PCP:  Wilfrid Lund, PA  Cardiologist:  None  Electrophysiologist:  None   Referring MD: Wilfrid Lund, PA   No chief complaint on file. ***  History of Present Illness:    Valerie Young is a 89 y.o. female with a hx of CVA, hypertension, hyperlipidemia, CKD stage III, hypothyroidism, lung cancer status post right upper lobectomy who is referred by Valerie Marshall, PA for evaluation of heart failure.  Echocardiogram 05/2020 showed EF 60 to 65%, normal diastolic function, normal RV function, no significant valvular disease.  Underwent loop recorder insertion 05/2020.  Past Medical History:  Diagnosis Date   Blood clot associated with vein wall inflammation    Cancer (HCC)    right lung ca - 30 years ago / left lung - 11/2012   Cancer (HCC)    Left lung   CKD (chronic kidney disease)    COPD (chronic obstructive pulmonary disease) (HCC)    DJD (degenerative joint disease)    knees   H/O: lung cancer 1982   Right; treated with resection   Hypertension    Hypothyroidism    PND (post-nasal drip)    Chronic    Past Surgical History:  Procedure Laterality Date   ABDOMINAL HYSTERECTOMY     PARTIAL   CATARACT EXTRACTION, BILATERAL     JOINT REPLACEMENT     RIGHT TOTAL KNEE   LOOP RECORDER INSERTION N/A 06/12/2020   Procedure: LOOP RECORDER INSERTION;  Surgeon: Duke Salvia, MD;  Location: [facility] INVASIVE CV LAB;  Service: Cardiovascular;  Laterality: N/A;   RADIATION OF LEFT LUNG     right lung cancer treated with resection  1982   right thigh postoperative hematoma  02-2008   RIGHT UPPER LOBE REMOVED     THYROID SURGERY     THYROIDECTOMY     TOTAL KNEE ARTHROPLASTY     Right   VESICOVAGINAL FISTULA CLOSURE W/ TAH     VIDEO BRONCHOSCOPY  12/08/2012   Procedure: VIDEO BRONCHOSCOPY WITH FLUORO;  Surgeon: Barbaraann Share, MD;  Location: [facility] ENDOSCOPY;  Service: Cardiopulmonary;   Laterality: Bilateral;    Current Medications: No outpatient medications have been marked as taking for the 10/14/23 encounter (Appointment) with Little Ishikawa, MD.     Allergies:   Sulfamethoxazole-trimethoprim and Tramadol hcl   Social History   Socioeconomic History   Marital status: Divorced    Spouse name: Not on file   Number of children: Not on file   Years of education: Not on file   Highest education level: Not on file  Occupational History   Occupation: Retired  Tobacco Use   Smoking status: Former    Current packs/day: 0.00    Average packs/day: 1 pack/day for 35.0 years (35.0 ttl pk-yrs)    Types: Cigarettes    Start date: 11/25/1945    Quit date: 11/25/1980    Years since quitting: 42.9   Smokeless tobacco: Never  Vaping Use   Vaping status: Never Used  Substance and Sexual Activity   Alcohol use: No   Drug use: No   Sexual activity: Never    Birth control/protection: Surgical  Other Topics Concern   Not on file  Social History Narrative   ** Merged History Encounter **       Social Determinants of Health   Financial Resource Strain: Not on file  Food  Insecurity: Not on file  Transportation Needs: Not on file  Physical Activity: Not on file  Stress: Not on file  Social Connections: Not on file     Family History: The patient's ***family history includes CVA (age of onset: 36) in her mother.  ROS:   Please see the history of present illness.    *** All other systems reviewed and are negative.  EKGs/Labs/Other Studies Reviewed:    The following studies were reviewed today: ***  EKG:  EKG is *** ordered today.  The ekg ordered today demonstrates ***  Recent Labs: 12/03/2022: ALT 11 12/06/2022: BUN 22; Creatinine, Ser 1.20; Hemoglobin 10.2; Magnesium 2.0; Platelets 233; Potassium 3.8; Sodium 140  Recent Lipid Panel    Component Value Date/Time   CHOL 141 06/08/2020 0251   TRIG 82 06/08/2020 0251   HDL 55 06/08/2020 0251   CHOLHDL  2.6 06/08/2020 0251   VLDL 16 06/08/2020 0251   LDLCALC 70 06/08/2020 0251    Physical Exam:    VS:  There were no vitals taken for this visit.    Wt Readings from Last 3 Encounters:  12/06/22 107 lb 5.8 oz (48.7 kg)  12/12/21 113 lb (51.3 kg)  05/23/21 113 lb (51.3 kg)     GEN: *** Well nourished, well developed in no acute distress HEENT: Normal NECK: No JVD; No carotid bruits LYMPHATICS: No lymphadenopathy CARDIAC: ***RRR, no murmurs, rubs, gallops RESPIRATORY:  Clear to auscultation without rales, wheezing or rhonchi  ABDOMEN: Soft, non-tender, non-distended MUSCULOSKELETAL:  No edema; No deformity  SKIN: Warm and dry NEUROLOGIC:  Alert and oriented x 3 PSYCHIATRIC:  Normal affect   ASSESSMENT:    No diagnosis found. PLAN:    ?CHF: Underwent recent chest x-ray concerning for possible heart failure.  Check echocardiogram.***  CVA: admitted 05/2020 with CVA.  Echocardiogram 05/2020 showed EF 60 to 65%, normal diastolic function, normal RV function, no significant valvular disease.  Underwent loop recorder insertion 05/2020, no atrial fibrillation seen to date -Continue atorvastatin, aspirin  Hypertension: On amlodipine 5 mg daily and atenolol 50 mg daily  Hyperlipidemia: On atorvastatin 10 mg daily  CKD stage IIIb:    RTC in***   Medication Adjustments/Labs and Tests Ordered: Current medicines are reviewed at length with the patient today.  Concerns regarding medicines are outlined above.  No orders of the defined types were placed in this encounter.  No orders of the defined types were placed in this encounter.   There are no Patient Instructions on file for this visit.   Signed, Little Ishikawa, MD  10/12/2023 5:03 PM    [facility]

## 2023-10-14 ENCOUNTER — Ambulatory Visit: Payer: Medicare Other | Attending: Cardiology | Admitting: Cardiology

## 2023-10-15 ENCOUNTER — Encounter: Payer: Self-pay | Admitting: Cardiology

## 2023-10-16 DIAGNOSIS — N1832 Chronic kidney disease, stage 3b: Secondary | ICD-10-CM | POA: Diagnosis not present

## 2023-10-16 DIAGNOSIS — I69354 Hemiplegia and hemiparesis following cerebral infarction affecting left non-dominant side: Secondary | ICD-10-CM | POA: Diagnosis not present

## 2023-10-16 DIAGNOSIS — E782 Mixed hyperlipidemia: Secondary | ICD-10-CM | POA: Diagnosis not present

## 2023-10-16 DIAGNOSIS — I129 Hypertensive chronic kidney disease with stage 1 through stage 4 chronic kidney disease, or unspecified chronic kidney disease: Secondary | ICD-10-CM | POA: Diagnosis not present

## 2023-10-16 DIAGNOSIS — J449 Chronic obstructive pulmonary disease, unspecified: Secondary | ICD-10-CM | POA: Diagnosis not present

## 2023-10-16 DIAGNOSIS — C3490 Malignant neoplasm of unspecified part of unspecified bronchus or lung: Secondary | ICD-10-CM | POA: Diagnosis not present

## 2023-10-26 DIAGNOSIS — F02B0 Dementia in other diseases classified elsewhere, moderate, without behavioral disturbance, psychotic disturbance, mood disturbance, and anxiety: Secondary | ICD-10-CM | POA: Diagnosis not present

## 2023-10-26 LAB — CUP PACEART REMOTE DEVICE CHECK
Date Time Interrogation Session: 20241129230016
Implantable Pulse Generator Implant Date: 20210719

## 2023-10-27 ENCOUNTER — Ambulatory Visit: Payer: Medicare Other

## 2023-10-27 DIAGNOSIS — I639 Cerebral infarction, unspecified: Secondary | ICD-10-CM

## 2023-11-06 DIAGNOSIS — I69354 Hemiplegia and hemiparesis following cerebral infarction affecting left non-dominant side: Secondary | ICD-10-CM | POA: Diagnosis not present

## 2023-11-06 DIAGNOSIS — N1832 Chronic kidney disease, stage 3b: Secondary | ICD-10-CM | POA: Diagnosis not present

## 2023-11-06 DIAGNOSIS — J449 Chronic obstructive pulmonary disease, unspecified: Secondary | ICD-10-CM | POA: Diagnosis not present

## 2023-11-06 DIAGNOSIS — C3490 Malignant neoplasm of unspecified part of unspecified bronchus or lung: Secondary | ICD-10-CM | POA: Diagnosis not present

## 2023-11-06 DIAGNOSIS — E782 Mixed hyperlipidemia: Secondary | ICD-10-CM | POA: Diagnosis not present

## 2023-11-06 DIAGNOSIS — I129 Hypertensive chronic kidney disease with stage 1 through stage 4 chronic kidney disease, or unspecified chronic kidney disease: Secondary | ICD-10-CM | POA: Diagnosis not present

## 2023-11-10 DIAGNOSIS — Z902 Acquired absence of lung [part of]: Secondary | ICD-10-CM | POA: Diagnosis not present

## 2023-11-10 DIAGNOSIS — Z602 Problems related to living alone: Secondary | ICD-10-CM | POA: Diagnosis not present

## 2023-11-10 DIAGNOSIS — F0153 Vascular dementia, unspecified severity, with mood disturbance: Secondary | ICD-10-CM | POA: Diagnosis not present

## 2023-11-10 DIAGNOSIS — G5791 Unspecified mononeuropathy of right lower limb: Secondary | ICD-10-CM | POA: Diagnosis not present

## 2023-11-10 DIAGNOSIS — G309 Alzheimer's disease, unspecified: Secondary | ICD-10-CM | POA: Diagnosis not present

## 2023-11-10 DIAGNOSIS — E559 Vitamin D deficiency, unspecified: Secondary | ICD-10-CM | POA: Diagnosis not present

## 2023-11-10 DIAGNOSIS — C3490 Malignant neoplasm of unspecified part of unspecified bronchus or lung: Secondary | ICD-10-CM | POA: Diagnosis not present

## 2023-11-10 DIAGNOSIS — I69354 Hemiplegia and hemiparesis following cerebral infarction affecting left non-dominant side: Secondary | ICD-10-CM | POA: Diagnosis not present

## 2023-11-10 DIAGNOSIS — J449 Chronic obstructive pulmonary disease, unspecified: Secondary | ICD-10-CM | POA: Diagnosis not present

## 2023-11-10 DIAGNOSIS — I129 Hypertensive chronic kidney disease with stage 1 through stage 4 chronic kidney disease, or unspecified chronic kidney disease: Secondary | ICD-10-CM | POA: Diagnosis not present

## 2023-11-10 DIAGNOSIS — I69318 Other symptoms and signs involving cognitive functions following cerebral infarction: Secondary | ICD-10-CM | POA: Diagnosis not present

## 2023-11-10 DIAGNOSIS — Z9181 History of falling: Secondary | ICD-10-CM | POA: Diagnosis not present

## 2023-11-10 DIAGNOSIS — Z8673 Personal history of transient ischemic attack (TIA), and cerebral infarction without residual deficits: Secondary | ICD-10-CM | POA: Diagnosis not present

## 2023-11-10 DIAGNOSIS — M153 Secondary multiple arthritis: Secondary | ICD-10-CM | POA: Diagnosis not present

## 2023-11-10 DIAGNOSIS — E039 Hypothyroidism, unspecified: Secondary | ICD-10-CM | POA: Diagnosis not present

## 2023-11-10 DIAGNOSIS — E782 Mixed hyperlipidemia: Secondary | ICD-10-CM | POA: Diagnosis not present

## 2023-11-10 DIAGNOSIS — F0283 Dementia in other diseases classified elsewhere, unspecified severity, with mood disturbance: Secondary | ICD-10-CM | POA: Diagnosis not present

## 2023-11-10 DIAGNOSIS — F32A Depression, unspecified: Secondary | ICD-10-CM | POA: Diagnosis not present

## 2023-11-10 DIAGNOSIS — K5790 Diverticulosis of intestine, part unspecified, without perforation or abscess without bleeding: Secondary | ICD-10-CM | POA: Diagnosis not present

## 2023-11-10 DIAGNOSIS — J310 Chronic rhinitis: Secondary | ICD-10-CM | POA: Diagnosis not present

## 2023-11-10 DIAGNOSIS — N1832 Chronic kidney disease, stage 3b: Secondary | ICD-10-CM | POA: Diagnosis not present

## 2023-11-14 ENCOUNTER — Encounter: Payer: Self-pay | Admitting: Emergency Medicine

## 2023-11-26 DIAGNOSIS — F02B0 Dementia in other diseases classified elsewhere, moderate, without behavioral disturbance, psychotic disturbance, mood disturbance, and anxiety: Secondary | ICD-10-CM | POA: Diagnosis not present

## 2023-11-28 DIAGNOSIS — I129 Hypertensive chronic kidney disease with stage 1 through stage 4 chronic kidney disease, or unspecified chronic kidney disease: Secondary | ICD-10-CM | POA: Diagnosis not present

## 2023-11-28 DIAGNOSIS — J449 Chronic obstructive pulmonary disease, unspecified: Secondary | ICD-10-CM | POA: Diagnosis not present

## 2023-11-28 DIAGNOSIS — I69354 Hemiplegia and hemiparesis following cerebral infarction affecting left non-dominant side: Secondary | ICD-10-CM | POA: Diagnosis not present

## 2023-11-28 DIAGNOSIS — N1832 Chronic kidney disease, stage 3b: Secondary | ICD-10-CM | POA: Diagnosis not present

## 2023-11-28 DIAGNOSIS — C3490 Malignant neoplasm of unspecified part of unspecified bronchus or lung: Secondary | ICD-10-CM | POA: Diagnosis not present

## 2023-11-28 DIAGNOSIS — E782 Mixed hyperlipidemia: Secondary | ICD-10-CM | POA: Diagnosis not present

## 2023-12-01 ENCOUNTER — Ambulatory Visit (INDEPENDENT_AMBULATORY_CARE_PROVIDER_SITE_OTHER): Payer: Medicare Other

## 2023-12-01 DIAGNOSIS — I639 Cerebral infarction, unspecified: Secondary | ICD-10-CM

## 2023-12-01 LAB — CUP PACEART REMOTE DEVICE CHECK
Date Time Interrogation Session: 20250105230042
Implantable Pulse Generator Implant Date: 20210719

## 2023-12-03 DIAGNOSIS — J9611 Chronic respiratory failure with hypoxia: Secondary | ICD-10-CM | POA: Diagnosis not present

## 2023-12-03 DIAGNOSIS — J069 Acute upper respiratory infection, unspecified: Secondary | ICD-10-CM | POA: Diagnosis not present

## 2023-12-03 DIAGNOSIS — Z85118 Personal history of other malignant neoplasm of bronchus and lung: Secondary | ICD-10-CM | POA: Diagnosis not present

## 2023-12-03 DIAGNOSIS — U071 COVID-19: Secondary | ICD-10-CM | POA: Diagnosis not present

## 2023-12-03 DIAGNOSIS — R9389 Abnormal findings on diagnostic imaging of other specified body structures: Secondary | ICD-10-CM | POA: Diagnosis not present

## 2023-12-03 DIAGNOSIS — J449 Chronic obstructive pulmonary disease, unspecified: Secondary | ICD-10-CM | POA: Diagnosis not present

## 2023-12-03 DIAGNOSIS — N1832 Chronic kidney disease, stage 3b: Secondary | ICD-10-CM | POA: Diagnosis not present

## 2023-12-03 DIAGNOSIS — I69319 Unspecified symptoms and signs involving cognitive functions following cerebral infarction: Secondary | ICD-10-CM | POA: Diagnosis not present

## 2023-12-03 DIAGNOSIS — I129 Hypertensive chronic kidney disease with stage 1 through stage 4 chronic kidney disease, or unspecified chronic kidney disease: Secondary | ICD-10-CM | POA: Diagnosis not present

## 2023-12-05 DIAGNOSIS — E782 Mixed hyperlipidemia: Secondary | ICD-10-CM | POA: Diagnosis not present

## 2023-12-05 DIAGNOSIS — C3490 Malignant neoplasm of unspecified part of unspecified bronchus or lung: Secondary | ICD-10-CM | POA: Diagnosis not present

## 2023-12-05 DIAGNOSIS — J449 Chronic obstructive pulmonary disease, unspecified: Secondary | ICD-10-CM | POA: Diagnosis not present

## 2023-12-05 DIAGNOSIS — I69354 Hemiplegia and hemiparesis following cerebral infarction affecting left non-dominant side: Secondary | ICD-10-CM | POA: Diagnosis not present

## 2023-12-05 DIAGNOSIS — N1832 Chronic kidney disease, stage 3b: Secondary | ICD-10-CM | POA: Diagnosis not present

## 2023-12-05 DIAGNOSIS — I129 Hypertensive chronic kidney disease with stage 1 through stage 4 chronic kidney disease, or unspecified chronic kidney disease: Secondary | ICD-10-CM | POA: Diagnosis not present

## 2023-12-10 DIAGNOSIS — Z85118 Personal history of other malignant neoplasm of bronchus and lung: Secondary | ICD-10-CM | POA: Diagnosis not present

## 2023-12-10 DIAGNOSIS — J449 Chronic obstructive pulmonary disease, unspecified: Secondary | ICD-10-CM | POA: Diagnosis not present

## 2023-12-10 DIAGNOSIS — F0390 Unspecified dementia without behavioral disturbance: Secondary | ICD-10-CM | POA: Diagnosis not present

## 2023-12-10 DIAGNOSIS — I10 Essential (primary) hypertension: Secondary | ICD-10-CM | POA: Diagnosis not present

## 2023-12-10 DIAGNOSIS — I679 Cerebrovascular disease, unspecified: Secondary | ICD-10-CM | POA: Diagnosis not present

## 2023-12-10 DIAGNOSIS — U071 COVID-19: Secondary | ICD-10-CM | POA: Diagnosis not present

## 2023-12-10 DIAGNOSIS — N1832 Chronic kidney disease, stage 3b: Secondary | ICD-10-CM | POA: Diagnosis not present

## 2023-12-10 DIAGNOSIS — J439 Emphysema, unspecified: Secondary | ICD-10-CM | POA: Diagnosis not present

## 2023-12-10 DIAGNOSIS — Z8673 Personal history of transient ischemic attack (TIA), and cerebral infarction without residual deficits: Secondary | ICD-10-CM | POA: Diagnosis not present

## 2023-12-10 DIAGNOSIS — I509 Heart failure, unspecified: Secondary | ICD-10-CM | POA: Diagnosis not present

## 2023-12-10 DIAGNOSIS — E785 Hyperlipidemia, unspecified: Secondary | ICD-10-CM | POA: Diagnosis not present

## 2023-12-10 DIAGNOSIS — Z9981 Dependence on supplemental oxygen: Secondary | ICD-10-CM | POA: Diagnosis not present

## 2023-12-11 DIAGNOSIS — J439 Emphysema, unspecified: Secondary | ICD-10-CM | POA: Diagnosis not present

## 2023-12-11 DIAGNOSIS — I679 Cerebrovascular disease, unspecified: Secondary | ICD-10-CM | POA: Diagnosis not present

## 2023-12-11 DIAGNOSIS — Z8673 Personal history of transient ischemic attack (TIA), and cerebral infarction without residual deficits: Secondary | ICD-10-CM | POA: Diagnosis not present

## 2023-12-11 DIAGNOSIS — U071 COVID-19: Secondary | ICD-10-CM | POA: Diagnosis not present

## 2023-12-11 DIAGNOSIS — F0390 Unspecified dementia without behavioral disturbance: Secondary | ICD-10-CM | POA: Diagnosis not present

## 2023-12-11 DIAGNOSIS — I10 Essential (primary) hypertension: Secondary | ICD-10-CM | POA: Diagnosis not present

## 2023-12-16 DIAGNOSIS — F0390 Unspecified dementia without behavioral disturbance: Secondary | ICD-10-CM | POA: Diagnosis not present

## 2023-12-16 DIAGNOSIS — I679 Cerebrovascular disease, unspecified: Secondary | ICD-10-CM | POA: Diagnosis not present

## 2023-12-16 DIAGNOSIS — J439 Emphysema, unspecified: Secondary | ICD-10-CM | POA: Diagnosis not present

## 2023-12-16 DIAGNOSIS — I10 Essential (primary) hypertension: Secondary | ICD-10-CM | POA: Diagnosis not present

## 2023-12-16 DIAGNOSIS — Z8673 Personal history of transient ischemic attack (TIA), and cerebral infarction without residual deficits: Secondary | ICD-10-CM | POA: Diagnosis not present

## 2023-12-16 DIAGNOSIS — U071 COVID-19: Secondary | ICD-10-CM | POA: Diagnosis not present

## 2023-12-19 DIAGNOSIS — I679 Cerebrovascular disease, unspecified: Secondary | ICD-10-CM | POA: Diagnosis not present

## 2023-12-19 DIAGNOSIS — J439 Emphysema, unspecified: Secondary | ICD-10-CM | POA: Diagnosis not present

## 2023-12-19 DIAGNOSIS — Z8673 Personal history of transient ischemic attack (TIA), and cerebral infarction without residual deficits: Secondary | ICD-10-CM | POA: Diagnosis not present

## 2023-12-19 DIAGNOSIS — F0390 Unspecified dementia without behavioral disturbance: Secondary | ICD-10-CM | POA: Diagnosis not present

## 2023-12-19 DIAGNOSIS — U071 COVID-19: Secondary | ICD-10-CM | POA: Diagnosis not present

## 2023-12-19 DIAGNOSIS — I10 Essential (primary) hypertension: Secondary | ICD-10-CM | POA: Diagnosis not present

## 2023-12-22 DIAGNOSIS — I679 Cerebrovascular disease, unspecified: Secondary | ICD-10-CM | POA: Diagnosis not present

## 2023-12-22 DIAGNOSIS — Z8673 Personal history of transient ischemic attack (TIA), and cerebral infarction without residual deficits: Secondary | ICD-10-CM | POA: Diagnosis not present

## 2023-12-22 DIAGNOSIS — I10 Essential (primary) hypertension: Secondary | ICD-10-CM | POA: Diagnosis not present

## 2023-12-22 DIAGNOSIS — J439 Emphysema, unspecified: Secondary | ICD-10-CM | POA: Diagnosis not present

## 2023-12-22 DIAGNOSIS — F0390 Unspecified dementia without behavioral disturbance: Secondary | ICD-10-CM | POA: Diagnosis not present

## 2023-12-22 DIAGNOSIS — U071 COVID-19: Secondary | ICD-10-CM | POA: Diagnosis not present

## 2023-12-23 DIAGNOSIS — U071 COVID-19: Secondary | ICD-10-CM | POA: Diagnosis not present

## 2023-12-23 DIAGNOSIS — I679 Cerebrovascular disease, unspecified: Secondary | ICD-10-CM | POA: Diagnosis not present

## 2023-12-23 DIAGNOSIS — J439 Emphysema, unspecified: Secondary | ICD-10-CM | POA: Diagnosis not present

## 2023-12-23 DIAGNOSIS — F0390 Unspecified dementia without behavioral disturbance: Secondary | ICD-10-CM | POA: Diagnosis not present

## 2023-12-23 DIAGNOSIS — Z8673 Personal history of transient ischemic attack (TIA), and cerebral infarction without residual deficits: Secondary | ICD-10-CM | POA: Diagnosis not present

## 2023-12-23 DIAGNOSIS — I10 Essential (primary) hypertension: Secondary | ICD-10-CM | POA: Diagnosis not present

## 2023-12-24 ENCOUNTER — Encounter: Payer: Self-pay | Admitting: Internal Medicine

## 2023-12-24 DIAGNOSIS — Z8673 Personal history of transient ischemic attack (TIA), and cerebral infarction without residual deficits: Secondary | ICD-10-CM | POA: Diagnosis not present

## 2023-12-24 DIAGNOSIS — U071 COVID-19: Secondary | ICD-10-CM | POA: Diagnosis not present

## 2023-12-24 DIAGNOSIS — I10 Essential (primary) hypertension: Secondary | ICD-10-CM | POA: Diagnosis not present

## 2023-12-24 DIAGNOSIS — I679 Cerebrovascular disease, unspecified: Secondary | ICD-10-CM | POA: Diagnosis not present

## 2023-12-24 DIAGNOSIS — F0390 Unspecified dementia without behavioral disturbance: Secondary | ICD-10-CM | POA: Diagnosis not present

## 2023-12-24 DIAGNOSIS — J439 Emphysema, unspecified: Secondary | ICD-10-CM | POA: Diagnosis not present

## 2023-12-26 DIAGNOSIS — J439 Emphysema, unspecified: Secondary | ICD-10-CM | POA: Diagnosis not present

## 2023-12-26 DIAGNOSIS — Z8673 Personal history of transient ischemic attack (TIA), and cerebral infarction without residual deficits: Secondary | ICD-10-CM | POA: Diagnosis not present

## 2023-12-26 DIAGNOSIS — I10 Essential (primary) hypertension: Secondary | ICD-10-CM | POA: Diagnosis not present

## 2023-12-26 DIAGNOSIS — U071 COVID-19: Secondary | ICD-10-CM | POA: Diagnosis not present

## 2023-12-26 DIAGNOSIS — F0390 Unspecified dementia without behavioral disturbance: Secondary | ICD-10-CM | POA: Diagnosis not present

## 2023-12-26 DIAGNOSIS — I679 Cerebrovascular disease, unspecified: Secondary | ICD-10-CM | POA: Diagnosis not present

## 2023-12-27 DIAGNOSIS — J449 Chronic obstructive pulmonary disease, unspecified: Secondary | ICD-10-CM | POA: Diagnosis not present

## 2023-12-27 DIAGNOSIS — U071 COVID-19: Secondary | ICD-10-CM | POA: Diagnosis not present

## 2023-12-27 DIAGNOSIS — F0390 Unspecified dementia without behavioral disturbance: Secondary | ICD-10-CM | POA: Diagnosis not present

## 2023-12-27 DIAGNOSIS — Z85118 Personal history of other malignant neoplasm of bronchus and lung: Secondary | ICD-10-CM | POA: Diagnosis not present

## 2023-12-27 DIAGNOSIS — J439 Emphysema, unspecified: Secondary | ICD-10-CM | POA: Diagnosis not present

## 2023-12-27 DIAGNOSIS — I679 Cerebrovascular disease, unspecified: Secondary | ICD-10-CM | POA: Diagnosis not present

## 2023-12-27 DIAGNOSIS — E785 Hyperlipidemia, unspecified: Secondary | ICD-10-CM | POA: Diagnosis not present

## 2023-12-27 DIAGNOSIS — I509 Heart failure, unspecified: Secondary | ICD-10-CM | POA: Diagnosis not present

## 2023-12-27 DIAGNOSIS — I10 Essential (primary) hypertension: Secondary | ICD-10-CM | POA: Diagnosis not present

## 2023-12-27 DIAGNOSIS — N1832 Chronic kidney disease, stage 3b: Secondary | ICD-10-CM | POA: Diagnosis not present

## 2023-12-27 DIAGNOSIS — Z9981 Dependence on supplemental oxygen: Secondary | ICD-10-CM | POA: Diagnosis not present

## 2023-12-27 DIAGNOSIS — Z8673 Personal history of transient ischemic attack (TIA), and cerebral infarction without residual deficits: Secondary | ICD-10-CM | POA: Diagnosis not present

## 2023-12-30 DIAGNOSIS — Z8673 Personal history of transient ischemic attack (TIA), and cerebral infarction without residual deficits: Secondary | ICD-10-CM | POA: Diagnosis not present

## 2023-12-30 DIAGNOSIS — U071 COVID-19: Secondary | ICD-10-CM | POA: Diagnosis not present

## 2023-12-30 DIAGNOSIS — J439 Emphysema, unspecified: Secondary | ICD-10-CM | POA: Diagnosis not present

## 2023-12-30 DIAGNOSIS — F0390 Unspecified dementia without behavioral disturbance: Secondary | ICD-10-CM | POA: Diagnosis not present

## 2023-12-30 DIAGNOSIS — I679 Cerebrovascular disease, unspecified: Secondary | ICD-10-CM | POA: Diagnosis not present

## 2023-12-30 DIAGNOSIS — I10 Essential (primary) hypertension: Secondary | ICD-10-CM | POA: Diagnosis not present

## 2024-01-01 DIAGNOSIS — I10 Essential (primary) hypertension: Secondary | ICD-10-CM | POA: Diagnosis not present

## 2024-01-01 DIAGNOSIS — I679 Cerebrovascular disease, unspecified: Secondary | ICD-10-CM | POA: Diagnosis not present

## 2024-01-01 DIAGNOSIS — J439 Emphysema, unspecified: Secondary | ICD-10-CM | POA: Diagnosis not present

## 2024-01-01 DIAGNOSIS — Z8673 Personal history of transient ischemic attack (TIA), and cerebral infarction without residual deficits: Secondary | ICD-10-CM | POA: Diagnosis not present

## 2024-01-01 DIAGNOSIS — U071 COVID-19: Secondary | ICD-10-CM | POA: Diagnosis not present

## 2024-01-01 DIAGNOSIS — F0390 Unspecified dementia without behavioral disturbance: Secondary | ICD-10-CM | POA: Diagnosis not present

## 2024-01-05 ENCOUNTER — Ambulatory Visit (INDEPENDENT_AMBULATORY_CARE_PROVIDER_SITE_OTHER): Payer: Medicare Other

## 2024-01-05 DIAGNOSIS — I10 Essential (primary) hypertension: Secondary | ICD-10-CM | POA: Diagnosis not present

## 2024-01-05 DIAGNOSIS — U071 COVID-19: Secondary | ICD-10-CM | POA: Diagnosis not present

## 2024-01-05 DIAGNOSIS — I679 Cerebrovascular disease, unspecified: Secondary | ICD-10-CM | POA: Diagnosis not present

## 2024-01-05 DIAGNOSIS — I639 Cerebral infarction, unspecified: Secondary | ICD-10-CM

## 2024-01-05 DIAGNOSIS — Z8673 Personal history of transient ischemic attack (TIA), and cerebral infarction without residual deficits: Secondary | ICD-10-CM | POA: Diagnosis not present

## 2024-01-05 DIAGNOSIS — F0390 Unspecified dementia without behavioral disturbance: Secondary | ICD-10-CM | POA: Diagnosis not present

## 2024-01-05 DIAGNOSIS — J439 Emphysema, unspecified: Secondary | ICD-10-CM | POA: Diagnosis not present

## 2024-01-05 LAB — CUP PACEART REMOTE DEVICE CHECK
Date Time Interrogation Session: 20250209230518
Implantable Pulse Generator Implant Date: 20210719

## 2024-01-07 NOTE — Progress Notes (Signed)
Carelink Summary Report / Loop Recorder

## 2024-01-08 DIAGNOSIS — F0390 Unspecified dementia without behavioral disturbance: Secondary | ICD-10-CM | POA: Diagnosis not present

## 2024-01-08 DIAGNOSIS — I679 Cerebrovascular disease, unspecified: Secondary | ICD-10-CM | POA: Diagnosis not present

## 2024-01-08 DIAGNOSIS — Z8673 Personal history of transient ischemic attack (TIA), and cerebral infarction without residual deficits: Secondary | ICD-10-CM | POA: Diagnosis not present

## 2024-01-08 DIAGNOSIS — J439 Emphysema, unspecified: Secondary | ICD-10-CM | POA: Diagnosis not present

## 2024-01-08 DIAGNOSIS — I10 Essential (primary) hypertension: Secondary | ICD-10-CM | POA: Diagnosis not present

## 2024-01-08 DIAGNOSIS — U071 COVID-19: Secondary | ICD-10-CM | POA: Diagnosis not present

## 2024-01-09 DIAGNOSIS — J439 Emphysema, unspecified: Secondary | ICD-10-CM | POA: Diagnosis not present

## 2024-01-09 DIAGNOSIS — I10 Essential (primary) hypertension: Secondary | ICD-10-CM | POA: Diagnosis not present

## 2024-01-09 DIAGNOSIS — U071 COVID-19: Secondary | ICD-10-CM | POA: Diagnosis not present

## 2024-01-09 DIAGNOSIS — I679 Cerebrovascular disease, unspecified: Secondary | ICD-10-CM | POA: Diagnosis not present

## 2024-01-09 DIAGNOSIS — Z8673 Personal history of transient ischemic attack (TIA), and cerebral infarction without residual deficits: Secondary | ICD-10-CM | POA: Diagnosis not present

## 2024-01-09 DIAGNOSIS — F0390 Unspecified dementia without behavioral disturbance: Secondary | ICD-10-CM | POA: Diagnosis not present

## 2024-01-12 DIAGNOSIS — I679 Cerebrovascular disease, unspecified: Secondary | ICD-10-CM | POA: Diagnosis not present

## 2024-01-12 DIAGNOSIS — F0390 Unspecified dementia without behavioral disturbance: Secondary | ICD-10-CM | POA: Diagnosis not present

## 2024-01-12 DIAGNOSIS — U071 COVID-19: Secondary | ICD-10-CM | POA: Diagnosis not present

## 2024-01-12 DIAGNOSIS — I10 Essential (primary) hypertension: Secondary | ICD-10-CM | POA: Diagnosis not present

## 2024-01-12 DIAGNOSIS — J439 Emphysema, unspecified: Secondary | ICD-10-CM | POA: Diagnosis not present

## 2024-01-12 DIAGNOSIS — Z8673 Personal history of transient ischemic attack (TIA), and cerebral infarction without residual deficits: Secondary | ICD-10-CM | POA: Diagnosis not present

## 2024-01-13 DIAGNOSIS — U071 COVID-19: Secondary | ICD-10-CM | POA: Diagnosis not present

## 2024-01-13 DIAGNOSIS — J439 Emphysema, unspecified: Secondary | ICD-10-CM | POA: Diagnosis not present

## 2024-01-13 DIAGNOSIS — F0390 Unspecified dementia without behavioral disturbance: Secondary | ICD-10-CM | POA: Diagnosis not present

## 2024-01-13 DIAGNOSIS — Z8673 Personal history of transient ischemic attack (TIA), and cerebral infarction without residual deficits: Secondary | ICD-10-CM | POA: Diagnosis not present

## 2024-01-13 DIAGNOSIS — I10 Essential (primary) hypertension: Secondary | ICD-10-CM | POA: Diagnosis not present

## 2024-01-13 DIAGNOSIS — I679 Cerebrovascular disease, unspecified: Secondary | ICD-10-CM | POA: Diagnosis not present

## 2024-01-14 DIAGNOSIS — I10 Essential (primary) hypertension: Secondary | ICD-10-CM | POA: Diagnosis not present

## 2024-01-14 DIAGNOSIS — U071 COVID-19: Secondary | ICD-10-CM | POA: Diagnosis not present

## 2024-01-14 DIAGNOSIS — I679 Cerebrovascular disease, unspecified: Secondary | ICD-10-CM | POA: Diagnosis not present

## 2024-01-14 DIAGNOSIS — J439 Emphysema, unspecified: Secondary | ICD-10-CM | POA: Diagnosis not present

## 2024-01-14 DIAGNOSIS — Z8673 Personal history of transient ischemic attack (TIA), and cerebral infarction without residual deficits: Secondary | ICD-10-CM | POA: Diagnosis not present

## 2024-01-14 DIAGNOSIS — F0390 Unspecified dementia without behavioral disturbance: Secondary | ICD-10-CM | POA: Diagnosis not present

## 2024-01-15 DIAGNOSIS — Z8673 Personal history of transient ischemic attack (TIA), and cerebral infarction without residual deficits: Secondary | ICD-10-CM | POA: Diagnosis not present

## 2024-01-15 DIAGNOSIS — F0390 Unspecified dementia without behavioral disturbance: Secondary | ICD-10-CM | POA: Diagnosis not present

## 2024-01-15 DIAGNOSIS — J439 Emphysema, unspecified: Secondary | ICD-10-CM | POA: Diagnosis not present

## 2024-01-15 DIAGNOSIS — I10 Essential (primary) hypertension: Secondary | ICD-10-CM | POA: Diagnosis not present

## 2024-01-15 DIAGNOSIS — U071 COVID-19: Secondary | ICD-10-CM | POA: Diagnosis not present

## 2024-01-15 DIAGNOSIS — I679 Cerebrovascular disease, unspecified: Secondary | ICD-10-CM | POA: Diagnosis not present

## 2024-01-16 DIAGNOSIS — F0390 Unspecified dementia without behavioral disturbance: Secondary | ICD-10-CM | POA: Diagnosis not present

## 2024-01-16 DIAGNOSIS — I679 Cerebrovascular disease, unspecified: Secondary | ICD-10-CM | POA: Diagnosis not present

## 2024-01-16 DIAGNOSIS — Z8673 Personal history of transient ischemic attack (TIA), and cerebral infarction without residual deficits: Secondary | ICD-10-CM | POA: Diagnosis not present

## 2024-01-16 DIAGNOSIS — U071 COVID-19: Secondary | ICD-10-CM | POA: Diagnosis not present

## 2024-01-16 DIAGNOSIS — I10 Essential (primary) hypertension: Secondary | ICD-10-CM | POA: Diagnosis not present

## 2024-01-16 DIAGNOSIS — J439 Emphysema, unspecified: Secondary | ICD-10-CM | POA: Diagnosis not present

## 2024-01-17 DIAGNOSIS — Z8673 Personal history of transient ischemic attack (TIA), and cerebral infarction without residual deficits: Secondary | ICD-10-CM | POA: Diagnosis not present

## 2024-01-17 DIAGNOSIS — U071 COVID-19: Secondary | ICD-10-CM | POA: Diagnosis not present

## 2024-01-17 DIAGNOSIS — I679 Cerebrovascular disease, unspecified: Secondary | ICD-10-CM | POA: Diagnosis not present

## 2024-01-17 DIAGNOSIS — J439 Emphysema, unspecified: Secondary | ICD-10-CM | POA: Diagnosis not present

## 2024-01-17 DIAGNOSIS — F0390 Unspecified dementia without behavioral disturbance: Secondary | ICD-10-CM | POA: Diagnosis not present

## 2024-01-17 DIAGNOSIS — I10 Essential (primary) hypertension: Secondary | ICD-10-CM | POA: Diagnosis not present

## 2024-01-19 DIAGNOSIS — F0390 Unspecified dementia without behavioral disturbance: Secondary | ICD-10-CM | POA: Diagnosis not present

## 2024-01-19 DIAGNOSIS — I679 Cerebrovascular disease, unspecified: Secondary | ICD-10-CM | POA: Diagnosis not present

## 2024-01-19 DIAGNOSIS — I10 Essential (primary) hypertension: Secondary | ICD-10-CM | POA: Diagnosis not present

## 2024-01-19 DIAGNOSIS — Z8673 Personal history of transient ischemic attack (TIA), and cerebral infarction without residual deficits: Secondary | ICD-10-CM | POA: Diagnosis not present

## 2024-01-19 DIAGNOSIS — U071 COVID-19: Secondary | ICD-10-CM | POA: Diagnosis not present

## 2024-01-19 DIAGNOSIS — J439 Emphysema, unspecified: Secondary | ICD-10-CM | POA: Diagnosis not present

## 2024-01-20 DIAGNOSIS — I10 Essential (primary) hypertension: Secondary | ICD-10-CM | POA: Diagnosis not present

## 2024-01-20 DIAGNOSIS — F0390 Unspecified dementia without behavioral disturbance: Secondary | ICD-10-CM | POA: Diagnosis not present

## 2024-01-20 DIAGNOSIS — J439 Emphysema, unspecified: Secondary | ICD-10-CM | POA: Diagnosis not present

## 2024-01-20 DIAGNOSIS — Z8673 Personal history of transient ischemic attack (TIA), and cerebral infarction without residual deficits: Secondary | ICD-10-CM | POA: Diagnosis not present

## 2024-01-20 DIAGNOSIS — I679 Cerebrovascular disease, unspecified: Secondary | ICD-10-CM | POA: Diagnosis not present

## 2024-01-20 DIAGNOSIS — U071 COVID-19: Secondary | ICD-10-CM | POA: Diagnosis not present

## 2024-01-22 DIAGNOSIS — F0390 Unspecified dementia without behavioral disturbance: Secondary | ICD-10-CM | POA: Diagnosis not present

## 2024-01-22 DIAGNOSIS — J439 Emphysema, unspecified: Secondary | ICD-10-CM | POA: Diagnosis not present

## 2024-01-22 DIAGNOSIS — I679 Cerebrovascular disease, unspecified: Secondary | ICD-10-CM | POA: Diagnosis not present

## 2024-01-22 DIAGNOSIS — U071 COVID-19: Secondary | ICD-10-CM | POA: Diagnosis not present

## 2024-01-22 DIAGNOSIS — I10 Essential (primary) hypertension: Secondary | ICD-10-CM | POA: Diagnosis not present

## 2024-01-22 DIAGNOSIS — Z8673 Personal history of transient ischemic attack (TIA), and cerebral infarction without residual deficits: Secondary | ICD-10-CM | POA: Diagnosis not present

## 2024-01-24 DIAGNOSIS — I10 Essential (primary) hypertension: Secondary | ICD-10-CM | POA: Diagnosis not present

## 2024-01-24 DIAGNOSIS — I509 Heart failure, unspecified: Secondary | ICD-10-CM | POA: Diagnosis not present

## 2024-01-24 DIAGNOSIS — Z8673 Personal history of transient ischemic attack (TIA), and cerebral infarction without residual deficits: Secondary | ICD-10-CM | POA: Diagnosis not present

## 2024-01-24 DIAGNOSIS — J449 Chronic obstructive pulmonary disease, unspecified: Secondary | ICD-10-CM | POA: Diagnosis not present

## 2024-01-24 DIAGNOSIS — N1832 Chronic kidney disease, stage 3b: Secondary | ICD-10-CM | POA: Diagnosis not present

## 2024-01-24 DIAGNOSIS — F0390 Unspecified dementia without behavioral disturbance: Secondary | ICD-10-CM | POA: Diagnosis not present

## 2024-01-24 DIAGNOSIS — Z85118 Personal history of other malignant neoplasm of bronchus and lung: Secondary | ICD-10-CM | POA: Diagnosis not present

## 2024-01-24 DIAGNOSIS — I679 Cerebrovascular disease, unspecified: Secondary | ICD-10-CM | POA: Diagnosis not present

## 2024-01-24 DIAGNOSIS — J439 Emphysema, unspecified: Secondary | ICD-10-CM | POA: Diagnosis not present

## 2024-01-24 DIAGNOSIS — Z9981 Dependence on supplemental oxygen: Secondary | ICD-10-CM | POA: Diagnosis not present

## 2024-01-24 DIAGNOSIS — U071 COVID-19: Secondary | ICD-10-CM | POA: Diagnosis not present

## 2024-01-24 DIAGNOSIS — E785 Hyperlipidemia, unspecified: Secondary | ICD-10-CM | POA: Diagnosis not present

## 2024-01-26 ENCOUNTER — Encounter: Payer: Self-pay | Admitting: Internal Medicine

## 2024-01-27 DIAGNOSIS — I10 Essential (primary) hypertension: Secondary | ICD-10-CM | POA: Diagnosis not present

## 2024-01-27 DIAGNOSIS — U071 COVID-19: Secondary | ICD-10-CM | POA: Diagnosis not present

## 2024-01-27 DIAGNOSIS — I679 Cerebrovascular disease, unspecified: Secondary | ICD-10-CM | POA: Diagnosis not present

## 2024-01-27 DIAGNOSIS — F0390 Unspecified dementia without behavioral disturbance: Secondary | ICD-10-CM | POA: Diagnosis not present

## 2024-01-27 DIAGNOSIS — Z8673 Personal history of transient ischemic attack (TIA), and cerebral infarction without residual deficits: Secondary | ICD-10-CM | POA: Diagnosis not present

## 2024-01-27 DIAGNOSIS — J439 Emphysema, unspecified: Secondary | ICD-10-CM | POA: Diagnosis not present

## 2024-01-30 DIAGNOSIS — F0390 Unspecified dementia without behavioral disturbance: Secondary | ICD-10-CM | POA: Diagnosis not present

## 2024-01-30 DIAGNOSIS — I10 Essential (primary) hypertension: Secondary | ICD-10-CM | POA: Diagnosis not present

## 2024-01-30 DIAGNOSIS — Z8673 Personal history of transient ischemic attack (TIA), and cerebral infarction without residual deficits: Secondary | ICD-10-CM | POA: Diagnosis not present

## 2024-01-30 DIAGNOSIS — J439 Emphysema, unspecified: Secondary | ICD-10-CM | POA: Diagnosis not present

## 2024-01-30 DIAGNOSIS — U071 COVID-19: Secondary | ICD-10-CM | POA: Diagnosis not present

## 2024-01-30 DIAGNOSIS — I679 Cerebrovascular disease, unspecified: Secondary | ICD-10-CM | POA: Diagnosis not present

## 2024-02-02 DIAGNOSIS — J439 Emphysema, unspecified: Secondary | ICD-10-CM | POA: Diagnosis not present

## 2024-02-02 DIAGNOSIS — U071 COVID-19: Secondary | ICD-10-CM | POA: Diagnosis not present

## 2024-02-02 DIAGNOSIS — I679 Cerebrovascular disease, unspecified: Secondary | ICD-10-CM | POA: Diagnosis not present

## 2024-02-02 DIAGNOSIS — F0390 Unspecified dementia without behavioral disturbance: Secondary | ICD-10-CM | POA: Diagnosis not present

## 2024-02-02 DIAGNOSIS — I10 Essential (primary) hypertension: Secondary | ICD-10-CM | POA: Diagnosis not present

## 2024-02-02 DIAGNOSIS — Z8673 Personal history of transient ischemic attack (TIA), and cerebral infarction without residual deficits: Secondary | ICD-10-CM | POA: Diagnosis not present

## 2024-02-03 DIAGNOSIS — F0390 Unspecified dementia without behavioral disturbance: Secondary | ICD-10-CM | POA: Diagnosis not present

## 2024-02-03 DIAGNOSIS — U071 COVID-19: Secondary | ICD-10-CM | POA: Diagnosis not present

## 2024-02-03 DIAGNOSIS — I679 Cerebrovascular disease, unspecified: Secondary | ICD-10-CM | POA: Diagnosis not present

## 2024-02-03 DIAGNOSIS — Z8673 Personal history of transient ischemic attack (TIA), and cerebral infarction without residual deficits: Secondary | ICD-10-CM | POA: Diagnosis not present

## 2024-02-03 DIAGNOSIS — I10 Essential (primary) hypertension: Secondary | ICD-10-CM | POA: Diagnosis not present

## 2024-02-03 DIAGNOSIS — J439 Emphysema, unspecified: Secondary | ICD-10-CM | POA: Diagnosis not present

## 2024-02-05 DIAGNOSIS — F0390 Unspecified dementia without behavioral disturbance: Secondary | ICD-10-CM | POA: Diagnosis not present

## 2024-02-05 DIAGNOSIS — Z8673 Personal history of transient ischemic attack (TIA), and cerebral infarction without residual deficits: Secondary | ICD-10-CM | POA: Diagnosis not present

## 2024-02-05 DIAGNOSIS — J439 Emphysema, unspecified: Secondary | ICD-10-CM | POA: Diagnosis not present

## 2024-02-05 DIAGNOSIS — U071 COVID-19: Secondary | ICD-10-CM | POA: Diagnosis not present

## 2024-02-05 DIAGNOSIS — I679 Cerebrovascular disease, unspecified: Secondary | ICD-10-CM | POA: Diagnosis not present

## 2024-02-05 DIAGNOSIS — I10 Essential (primary) hypertension: Secondary | ICD-10-CM | POA: Diagnosis not present

## 2024-02-08 DIAGNOSIS — U071 COVID-19: Secondary | ICD-10-CM | POA: Diagnosis not present

## 2024-02-08 DIAGNOSIS — F0390 Unspecified dementia without behavioral disturbance: Secondary | ICD-10-CM | POA: Diagnosis not present

## 2024-02-08 DIAGNOSIS — Z8673 Personal history of transient ischemic attack (TIA), and cerebral infarction without residual deficits: Secondary | ICD-10-CM | POA: Diagnosis not present

## 2024-02-08 DIAGNOSIS — I10 Essential (primary) hypertension: Secondary | ICD-10-CM | POA: Diagnosis not present

## 2024-02-08 DIAGNOSIS — I679 Cerebrovascular disease, unspecified: Secondary | ICD-10-CM | POA: Diagnosis not present

## 2024-02-08 DIAGNOSIS — J439 Emphysema, unspecified: Secondary | ICD-10-CM | POA: Diagnosis not present

## 2024-02-09 ENCOUNTER — Ambulatory Visit (INDEPENDENT_AMBULATORY_CARE_PROVIDER_SITE_OTHER): Payer: Medicare Other

## 2024-02-09 DIAGNOSIS — I679 Cerebrovascular disease, unspecified: Secondary | ICD-10-CM | POA: Diagnosis not present

## 2024-02-09 DIAGNOSIS — I639 Cerebral infarction, unspecified: Secondary | ICD-10-CM

## 2024-02-09 DIAGNOSIS — J439 Emphysema, unspecified: Secondary | ICD-10-CM | POA: Diagnosis not present

## 2024-02-09 DIAGNOSIS — I10 Essential (primary) hypertension: Secondary | ICD-10-CM | POA: Diagnosis not present

## 2024-02-09 DIAGNOSIS — F0390 Unspecified dementia without behavioral disturbance: Secondary | ICD-10-CM | POA: Diagnosis not present

## 2024-02-09 DIAGNOSIS — Z8673 Personal history of transient ischemic attack (TIA), and cerebral infarction without residual deficits: Secondary | ICD-10-CM | POA: Diagnosis not present

## 2024-02-09 DIAGNOSIS — U071 COVID-19: Secondary | ICD-10-CM | POA: Diagnosis not present

## 2024-02-09 NOTE — Progress Notes (Signed)
 Carelink Summary Report / Loop Recorder

## 2024-02-10 DIAGNOSIS — U071 COVID-19: Secondary | ICD-10-CM | POA: Diagnosis not present

## 2024-02-10 DIAGNOSIS — F0390 Unspecified dementia without behavioral disturbance: Secondary | ICD-10-CM | POA: Diagnosis not present

## 2024-02-10 DIAGNOSIS — Z8673 Personal history of transient ischemic attack (TIA), and cerebral infarction without residual deficits: Secondary | ICD-10-CM | POA: Diagnosis not present

## 2024-02-10 DIAGNOSIS — I10 Essential (primary) hypertension: Secondary | ICD-10-CM | POA: Diagnosis not present

## 2024-02-10 DIAGNOSIS — J439 Emphysema, unspecified: Secondary | ICD-10-CM | POA: Diagnosis not present

## 2024-02-10 DIAGNOSIS — I679 Cerebrovascular disease, unspecified: Secondary | ICD-10-CM | POA: Diagnosis not present

## 2024-02-10 LAB — CUP PACEART REMOTE DEVICE CHECK
Date Time Interrogation Session: 20250316230138
Implantable Pulse Generator Implant Date: 20210719

## 2024-02-11 DIAGNOSIS — U071 COVID-19: Secondary | ICD-10-CM | POA: Diagnosis not present

## 2024-02-11 DIAGNOSIS — F0390 Unspecified dementia without behavioral disturbance: Secondary | ICD-10-CM | POA: Diagnosis not present

## 2024-02-11 DIAGNOSIS — I10 Essential (primary) hypertension: Secondary | ICD-10-CM | POA: Diagnosis not present

## 2024-02-11 DIAGNOSIS — Z789 Other specified health status: Secondary | ICD-10-CM | POA: Diagnosis not present

## 2024-02-11 DIAGNOSIS — Z8673 Personal history of transient ischemic attack (TIA), and cerebral infarction without residual deficits: Secondary | ICD-10-CM | POA: Diagnosis not present

## 2024-02-11 DIAGNOSIS — J439 Emphysema, unspecified: Secondary | ICD-10-CM | POA: Diagnosis not present

## 2024-02-11 DIAGNOSIS — R69 Illness, unspecified: Secondary | ICD-10-CM | POA: Diagnosis not present

## 2024-02-11 DIAGNOSIS — I679 Cerebrovascular disease, unspecified: Secondary | ICD-10-CM | POA: Diagnosis not present

## 2024-02-12 DIAGNOSIS — Z8673 Personal history of transient ischemic attack (TIA), and cerebral infarction without residual deficits: Secondary | ICD-10-CM | POA: Diagnosis not present

## 2024-02-12 DIAGNOSIS — F0390 Unspecified dementia without behavioral disturbance: Secondary | ICD-10-CM | POA: Diagnosis not present

## 2024-02-12 DIAGNOSIS — I10 Essential (primary) hypertension: Secondary | ICD-10-CM | POA: Diagnosis not present

## 2024-02-12 DIAGNOSIS — I679 Cerebrovascular disease, unspecified: Secondary | ICD-10-CM | POA: Diagnosis not present

## 2024-02-12 DIAGNOSIS — U071 COVID-19: Secondary | ICD-10-CM | POA: Diagnosis not present

## 2024-02-12 DIAGNOSIS — J439 Emphysema, unspecified: Secondary | ICD-10-CM | POA: Diagnosis not present

## 2024-02-13 DIAGNOSIS — J439 Emphysema, unspecified: Secondary | ICD-10-CM | POA: Diagnosis not present

## 2024-02-13 DIAGNOSIS — F0390 Unspecified dementia without behavioral disturbance: Secondary | ICD-10-CM | POA: Diagnosis not present

## 2024-02-13 DIAGNOSIS — Z8673 Personal history of transient ischemic attack (TIA), and cerebral infarction without residual deficits: Secondary | ICD-10-CM | POA: Diagnosis not present

## 2024-02-13 DIAGNOSIS — I10 Essential (primary) hypertension: Secondary | ICD-10-CM | POA: Diagnosis not present

## 2024-02-13 DIAGNOSIS — U071 COVID-19: Secondary | ICD-10-CM | POA: Diagnosis not present

## 2024-02-13 DIAGNOSIS — I679 Cerebrovascular disease, unspecified: Secondary | ICD-10-CM | POA: Diagnosis not present

## 2024-02-14 DIAGNOSIS — U071 COVID-19: Secondary | ICD-10-CM | POA: Diagnosis not present

## 2024-02-14 DIAGNOSIS — J439 Emphysema, unspecified: Secondary | ICD-10-CM | POA: Diagnosis not present

## 2024-02-14 DIAGNOSIS — Z8673 Personal history of transient ischemic attack (TIA), and cerebral infarction without residual deficits: Secondary | ICD-10-CM | POA: Diagnosis not present

## 2024-02-14 DIAGNOSIS — I10 Essential (primary) hypertension: Secondary | ICD-10-CM | POA: Diagnosis not present

## 2024-02-14 DIAGNOSIS — I679 Cerebrovascular disease, unspecified: Secondary | ICD-10-CM | POA: Diagnosis not present

## 2024-02-14 DIAGNOSIS — F0390 Unspecified dementia without behavioral disturbance: Secondary | ICD-10-CM | POA: Diagnosis not present

## 2024-02-15 DIAGNOSIS — Z8673 Personal history of transient ischemic attack (TIA), and cerebral infarction without residual deficits: Secondary | ICD-10-CM | POA: Diagnosis not present

## 2024-02-15 DIAGNOSIS — U071 COVID-19: Secondary | ICD-10-CM | POA: Diagnosis not present

## 2024-02-15 DIAGNOSIS — F0390 Unspecified dementia without behavioral disturbance: Secondary | ICD-10-CM | POA: Diagnosis not present

## 2024-02-15 DIAGNOSIS — I10 Essential (primary) hypertension: Secondary | ICD-10-CM | POA: Diagnosis not present

## 2024-02-15 DIAGNOSIS — I679 Cerebrovascular disease, unspecified: Secondary | ICD-10-CM | POA: Diagnosis not present

## 2024-02-15 DIAGNOSIS — J439 Emphysema, unspecified: Secondary | ICD-10-CM | POA: Diagnosis not present

## 2024-02-16 DIAGNOSIS — I10 Essential (primary) hypertension: Secondary | ICD-10-CM | POA: Diagnosis not present

## 2024-02-16 DIAGNOSIS — J439 Emphysema, unspecified: Secondary | ICD-10-CM | POA: Diagnosis not present

## 2024-02-16 DIAGNOSIS — U071 COVID-19: Secondary | ICD-10-CM | POA: Diagnosis not present

## 2024-02-16 DIAGNOSIS — Z8673 Personal history of transient ischemic attack (TIA), and cerebral infarction without residual deficits: Secondary | ICD-10-CM | POA: Diagnosis not present

## 2024-02-16 DIAGNOSIS — I679 Cerebrovascular disease, unspecified: Secondary | ICD-10-CM | POA: Diagnosis not present

## 2024-02-16 DIAGNOSIS — F0390 Unspecified dementia without behavioral disturbance: Secondary | ICD-10-CM | POA: Diagnosis not present

## 2024-02-17 DIAGNOSIS — F0390 Unspecified dementia without behavioral disturbance: Secondary | ICD-10-CM | POA: Diagnosis not present

## 2024-02-17 DIAGNOSIS — Z8673 Personal history of transient ischemic attack (TIA), and cerebral infarction without residual deficits: Secondary | ICD-10-CM | POA: Diagnosis not present

## 2024-02-17 DIAGNOSIS — U071 COVID-19: Secondary | ICD-10-CM | POA: Diagnosis not present

## 2024-02-17 DIAGNOSIS — I679 Cerebrovascular disease, unspecified: Secondary | ICD-10-CM | POA: Diagnosis not present

## 2024-02-17 DIAGNOSIS — I10 Essential (primary) hypertension: Secondary | ICD-10-CM | POA: Diagnosis not present

## 2024-02-17 DIAGNOSIS — J439 Emphysema, unspecified: Secondary | ICD-10-CM | POA: Diagnosis not present

## 2024-02-19 DIAGNOSIS — J439 Emphysema, unspecified: Secondary | ICD-10-CM | POA: Diagnosis not present

## 2024-02-19 DIAGNOSIS — F0390 Unspecified dementia without behavioral disturbance: Secondary | ICD-10-CM | POA: Diagnosis not present

## 2024-02-19 DIAGNOSIS — U071 COVID-19: Secondary | ICD-10-CM | POA: Diagnosis not present

## 2024-02-19 DIAGNOSIS — Z8673 Personal history of transient ischemic attack (TIA), and cerebral infarction without residual deficits: Secondary | ICD-10-CM | POA: Diagnosis not present

## 2024-02-19 DIAGNOSIS — I679 Cerebrovascular disease, unspecified: Secondary | ICD-10-CM | POA: Diagnosis not present

## 2024-02-19 DIAGNOSIS — I10 Essential (primary) hypertension: Secondary | ICD-10-CM | POA: Diagnosis not present

## 2024-02-20 DIAGNOSIS — Z8673 Personal history of transient ischemic attack (TIA), and cerebral infarction without residual deficits: Secondary | ICD-10-CM | POA: Diagnosis not present

## 2024-02-20 DIAGNOSIS — U071 COVID-19: Secondary | ICD-10-CM | POA: Diagnosis not present

## 2024-02-20 DIAGNOSIS — I10 Essential (primary) hypertension: Secondary | ICD-10-CM | POA: Diagnosis not present

## 2024-02-20 DIAGNOSIS — J439 Emphysema, unspecified: Secondary | ICD-10-CM | POA: Diagnosis not present

## 2024-02-20 DIAGNOSIS — I679 Cerebrovascular disease, unspecified: Secondary | ICD-10-CM | POA: Diagnosis not present

## 2024-02-20 DIAGNOSIS — F0390 Unspecified dementia without behavioral disturbance: Secondary | ICD-10-CM | POA: Diagnosis not present

## 2024-02-22 DIAGNOSIS — I10 Essential (primary) hypertension: Secondary | ICD-10-CM | POA: Diagnosis not present

## 2024-02-22 DIAGNOSIS — F0390 Unspecified dementia without behavioral disturbance: Secondary | ICD-10-CM | POA: Diagnosis not present

## 2024-02-22 DIAGNOSIS — I679 Cerebrovascular disease, unspecified: Secondary | ICD-10-CM | POA: Diagnosis not present

## 2024-02-22 DIAGNOSIS — J439 Emphysema, unspecified: Secondary | ICD-10-CM | POA: Diagnosis not present

## 2024-02-22 DIAGNOSIS — U071 COVID-19: Secondary | ICD-10-CM | POA: Diagnosis not present

## 2024-02-22 DIAGNOSIS — Z8673 Personal history of transient ischemic attack (TIA), and cerebral infarction without residual deficits: Secondary | ICD-10-CM | POA: Diagnosis not present

## 2024-02-23 DIAGNOSIS — I679 Cerebrovascular disease, unspecified: Secondary | ICD-10-CM | POA: Diagnosis not present

## 2024-02-23 DIAGNOSIS — U071 COVID-19: Secondary | ICD-10-CM | POA: Diagnosis not present

## 2024-02-23 DIAGNOSIS — I10 Essential (primary) hypertension: Secondary | ICD-10-CM | POA: Diagnosis not present

## 2024-02-23 DIAGNOSIS — F0390 Unspecified dementia without behavioral disturbance: Secondary | ICD-10-CM | POA: Diagnosis not present

## 2024-02-23 DIAGNOSIS — Z8673 Personal history of transient ischemic attack (TIA), and cerebral infarction without residual deficits: Secondary | ICD-10-CM | POA: Diagnosis not present

## 2024-02-23 DIAGNOSIS — J439 Emphysema, unspecified: Secondary | ICD-10-CM | POA: Diagnosis not present

## 2024-02-24 DIAGNOSIS — I509 Heart failure, unspecified: Secondary | ICD-10-CM | POA: Diagnosis not present

## 2024-02-24 DIAGNOSIS — Z8673 Personal history of transient ischemic attack (TIA), and cerebral infarction without residual deficits: Secondary | ICD-10-CM | POA: Diagnosis not present

## 2024-02-24 DIAGNOSIS — I679 Cerebrovascular disease, unspecified: Secondary | ICD-10-CM | POA: Diagnosis not present

## 2024-02-24 DIAGNOSIS — I10 Essential (primary) hypertension: Secondary | ICD-10-CM | POA: Diagnosis not present

## 2024-02-24 DIAGNOSIS — F0390 Unspecified dementia without behavioral disturbance: Secondary | ICD-10-CM | POA: Diagnosis not present

## 2024-02-24 DIAGNOSIS — J439 Emphysema, unspecified: Secondary | ICD-10-CM | POA: Diagnosis not present

## 2024-02-24 DIAGNOSIS — Z9981 Dependence on supplemental oxygen: Secondary | ICD-10-CM | POA: Diagnosis not present

## 2024-02-24 DIAGNOSIS — J449 Chronic obstructive pulmonary disease, unspecified: Secondary | ICD-10-CM | POA: Diagnosis not present

## 2024-02-24 DIAGNOSIS — E785 Hyperlipidemia, unspecified: Secondary | ICD-10-CM | POA: Diagnosis not present

## 2024-02-24 DIAGNOSIS — Z85118 Personal history of other malignant neoplasm of bronchus and lung: Secondary | ICD-10-CM | POA: Diagnosis not present

## 2024-02-24 DIAGNOSIS — U071 COVID-19: Secondary | ICD-10-CM | POA: Diagnosis not present

## 2024-02-24 DIAGNOSIS — N1832 Chronic kidney disease, stage 3b: Secondary | ICD-10-CM | POA: Diagnosis not present

## 2024-02-25 DIAGNOSIS — F0390 Unspecified dementia without behavioral disturbance: Secondary | ICD-10-CM | POA: Diagnosis not present

## 2024-02-25 DIAGNOSIS — I679 Cerebrovascular disease, unspecified: Secondary | ICD-10-CM | POA: Diagnosis not present

## 2024-02-25 DIAGNOSIS — Z8673 Personal history of transient ischemic attack (TIA), and cerebral infarction without residual deficits: Secondary | ICD-10-CM | POA: Diagnosis not present

## 2024-02-25 DIAGNOSIS — I10 Essential (primary) hypertension: Secondary | ICD-10-CM | POA: Diagnosis not present

## 2024-02-25 DIAGNOSIS — U071 COVID-19: Secondary | ICD-10-CM | POA: Diagnosis not present

## 2024-02-25 DIAGNOSIS — J439 Emphysema, unspecified: Secondary | ICD-10-CM | POA: Diagnosis not present

## 2024-02-26 DIAGNOSIS — I10 Essential (primary) hypertension: Secondary | ICD-10-CM | POA: Diagnosis not present

## 2024-02-26 DIAGNOSIS — F0390 Unspecified dementia without behavioral disturbance: Secondary | ICD-10-CM | POA: Diagnosis not present

## 2024-02-26 DIAGNOSIS — U071 COVID-19: Secondary | ICD-10-CM | POA: Diagnosis not present

## 2024-02-26 DIAGNOSIS — Z8673 Personal history of transient ischemic attack (TIA), and cerebral infarction without residual deficits: Secondary | ICD-10-CM | POA: Diagnosis not present

## 2024-02-26 DIAGNOSIS — J439 Emphysema, unspecified: Secondary | ICD-10-CM | POA: Diagnosis not present

## 2024-02-26 DIAGNOSIS — I679 Cerebrovascular disease, unspecified: Secondary | ICD-10-CM | POA: Diagnosis not present

## 2024-02-27 DIAGNOSIS — F0390 Unspecified dementia without behavioral disturbance: Secondary | ICD-10-CM | POA: Diagnosis not present

## 2024-02-27 DIAGNOSIS — I679 Cerebrovascular disease, unspecified: Secondary | ICD-10-CM | POA: Diagnosis not present

## 2024-02-27 DIAGNOSIS — I10 Essential (primary) hypertension: Secondary | ICD-10-CM | POA: Diagnosis not present

## 2024-02-27 DIAGNOSIS — J439 Emphysema, unspecified: Secondary | ICD-10-CM | POA: Diagnosis not present

## 2024-02-27 DIAGNOSIS — Z8673 Personal history of transient ischemic attack (TIA), and cerebral infarction without residual deficits: Secondary | ICD-10-CM | POA: Diagnosis not present

## 2024-02-27 DIAGNOSIS — U071 COVID-19: Secondary | ICD-10-CM | POA: Diagnosis not present

## 2024-02-28 DIAGNOSIS — U071 COVID-19: Secondary | ICD-10-CM | POA: Diagnosis not present

## 2024-02-28 DIAGNOSIS — F0390 Unspecified dementia without behavioral disturbance: Secondary | ICD-10-CM | POA: Diagnosis not present

## 2024-02-28 DIAGNOSIS — I10 Essential (primary) hypertension: Secondary | ICD-10-CM | POA: Diagnosis not present

## 2024-02-28 DIAGNOSIS — J439 Emphysema, unspecified: Secondary | ICD-10-CM | POA: Diagnosis not present

## 2024-02-28 DIAGNOSIS — I679 Cerebrovascular disease, unspecified: Secondary | ICD-10-CM | POA: Diagnosis not present

## 2024-02-28 DIAGNOSIS — Z8673 Personal history of transient ischemic attack (TIA), and cerebral infarction without residual deficits: Secondary | ICD-10-CM | POA: Diagnosis not present

## 2024-02-29 DIAGNOSIS — F0390 Unspecified dementia without behavioral disturbance: Secondary | ICD-10-CM | POA: Diagnosis not present

## 2024-02-29 DIAGNOSIS — Z8673 Personal history of transient ischemic attack (TIA), and cerebral infarction without residual deficits: Secondary | ICD-10-CM | POA: Diagnosis not present

## 2024-02-29 DIAGNOSIS — I679 Cerebrovascular disease, unspecified: Secondary | ICD-10-CM | POA: Diagnosis not present

## 2024-02-29 DIAGNOSIS — I10 Essential (primary) hypertension: Secondary | ICD-10-CM | POA: Diagnosis not present

## 2024-02-29 DIAGNOSIS — J439 Emphysema, unspecified: Secondary | ICD-10-CM | POA: Diagnosis not present

## 2024-02-29 DIAGNOSIS — U071 COVID-19: Secondary | ICD-10-CM | POA: Diagnosis not present

## 2024-03-01 DIAGNOSIS — J439 Emphysema, unspecified: Secondary | ICD-10-CM | POA: Diagnosis not present

## 2024-03-01 DIAGNOSIS — U071 COVID-19: Secondary | ICD-10-CM | POA: Diagnosis not present

## 2024-03-01 DIAGNOSIS — Z8673 Personal history of transient ischemic attack (TIA), and cerebral infarction without residual deficits: Secondary | ICD-10-CM | POA: Diagnosis not present

## 2024-03-01 DIAGNOSIS — I10 Essential (primary) hypertension: Secondary | ICD-10-CM | POA: Diagnosis not present

## 2024-03-01 DIAGNOSIS — I679 Cerebrovascular disease, unspecified: Secondary | ICD-10-CM | POA: Diagnosis not present

## 2024-03-01 DIAGNOSIS — F0390 Unspecified dementia without behavioral disturbance: Secondary | ICD-10-CM | POA: Diagnosis not present

## 2024-03-02 DIAGNOSIS — F0390 Unspecified dementia without behavioral disturbance: Secondary | ICD-10-CM | POA: Diagnosis not present

## 2024-03-02 DIAGNOSIS — Z8673 Personal history of transient ischemic attack (TIA), and cerebral infarction without residual deficits: Secondary | ICD-10-CM | POA: Diagnosis not present

## 2024-03-02 DIAGNOSIS — I679 Cerebrovascular disease, unspecified: Secondary | ICD-10-CM | POA: Diagnosis not present

## 2024-03-02 DIAGNOSIS — I10 Essential (primary) hypertension: Secondary | ICD-10-CM | POA: Diagnosis not present

## 2024-03-02 DIAGNOSIS — U071 COVID-19: Secondary | ICD-10-CM | POA: Diagnosis not present

## 2024-03-02 DIAGNOSIS — J439 Emphysema, unspecified: Secondary | ICD-10-CM | POA: Diagnosis not present

## 2024-03-03 DIAGNOSIS — Z8673 Personal history of transient ischemic attack (TIA), and cerebral infarction without residual deficits: Secondary | ICD-10-CM | POA: Diagnosis not present

## 2024-03-03 DIAGNOSIS — I10 Essential (primary) hypertension: Secondary | ICD-10-CM | POA: Diagnosis not present

## 2024-03-03 DIAGNOSIS — U071 COVID-19: Secondary | ICD-10-CM | POA: Diagnosis not present

## 2024-03-03 DIAGNOSIS — I679 Cerebrovascular disease, unspecified: Secondary | ICD-10-CM | POA: Diagnosis not present

## 2024-03-03 DIAGNOSIS — F0390 Unspecified dementia without behavioral disturbance: Secondary | ICD-10-CM | POA: Diagnosis not present

## 2024-03-03 DIAGNOSIS — J439 Emphysema, unspecified: Secondary | ICD-10-CM | POA: Diagnosis not present

## 2024-03-04 DIAGNOSIS — F0390 Unspecified dementia without behavioral disturbance: Secondary | ICD-10-CM | POA: Diagnosis not present

## 2024-03-04 DIAGNOSIS — I10 Essential (primary) hypertension: Secondary | ICD-10-CM | POA: Diagnosis not present

## 2024-03-04 DIAGNOSIS — I679 Cerebrovascular disease, unspecified: Secondary | ICD-10-CM | POA: Diagnosis not present

## 2024-03-04 DIAGNOSIS — U071 COVID-19: Secondary | ICD-10-CM | POA: Diagnosis not present

## 2024-03-04 DIAGNOSIS — Z8673 Personal history of transient ischemic attack (TIA), and cerebral infarction without residual deficits: Secondary | ICD-10-CM | POA: Diagnosis not present

## 2024-03-04 DIAGNOSIS — J439 Emphysema, unspecified: Secondary | ICD-10-CM | POA: Diagnosis not present

## 2024-03-05 DIAGNOSIS — J439 Emphysema, unspecified: Secondary | ICD-10-CM | POA: Diagnosis not present

## 2024-03-05 DIAGNOSIS — I679 Cerebrovascular disease, unspecified: Secondary | ICD-10-CM | POA: Diagnosis not present

## 2024-03-05 DIAGNOSIS — Z8673 Personal history of transient ischemic attack (TIA), and cerebral infarction without residual deficits: Secondary | ICD-10-CM | POA: Diagnosis not present

## 2024-03-05 DIAGNOSIS — U071 COVID-19: Secondary | ICD-10-CM | POA: Diagnosis not present

## 2024-03-05 DIAGNOSIS — I10 Essential (primary) hypertension: Secondary | ICD-10-CM | POA: Diagnosis not present

## 2024-03-05 DIAGNOSIS — F0390 Unspecified dementia without behavioral disturbance: Secondary | ICD-10-CM | POA: Diagnosis not present

## 2024-03-08 DIAGNOSIS — Z8673 Personal history of transient ischemic attack (TIA), and cerebral infarction without residual deficits: Secondary | ICD-10-CM | POA: Diagnosis not present

## 2024-03-08 DIAGNOSIS — J439 Emphysema, unspecified: Secondary | ICD-10-CM | POA: Diagnosis not present

## 2024-03-08 DIAGNOSIS — F0390 Unspecified dementia without behavioral disturbance: Secondary | ICD-10-CM | POA: Diagnosis not present

## 2024-03-08 DIAGNOSIS — I10 Essential (primary) hypertension: Secondary | ICD-10-CM | POA: Diagnosis not present

## 2024-03-08 DIAGNOSIS — I679 Cerebrovascular disease, unspecified: Secondary | ICD-10-CM | POA: Diagnosis not present

## 2024-03-08 DIAGNOSIS — U071 COVID-19: Secondary | ICD-10-CM | POA: Diagnosis not present

## 2024-03-10 DIAGNOSIS — I10 Essential (primary) hypertension: Secondary | ICD-10-CM | POA: Diagnosis not present

## 2024-03-10 DIAGNOSIS — J439 Emphysema, unspecified: Secondary | ICD-10-CM | POA: Diagnosis not present

## 2024-03-10 DIAGNOSIS — I679 Cerebrovascular disease, unspecified: Secondary | ICD-10-CM | POA: Diagnosis not present

## 2024-03-10 DIAGNOSIS — Z8673 Personal history of transient ischemic attack (TIA), and cerebral infarction without residual deficits: Secondary | ICD-10-CM | POA: Diagnosis not present

## 2024-03-10 DIAGNOSIS — F0390 Unspecified dementia without behavioral disturbance: Secondary | ICD-10-CM | POA: Diagnosis not present

## 2024-03-10 DIAGNOSIS — U071 COVID-19: Secondary | ICD-10-CM | POA: Diagnosis not present

## 2024-03-11 DIAGNOSIS — U071 COVID-19: Secondary | ICD-10-CM | POA: Diagnosis not present

## 2024-03-11 DIAGNOSIS — Z8673 Personal history of transient ischemic attack (TIA), and cerebral infarction without residual deficits: Secondary | ICD-10-CM | POA: Diagnosis not present

## 2024-03-11 DIAGNOSIS — I10 Essential (primary) hypertension: Secondary | ICD-10-CM | POA: Diagnosis not present

## 2024-03-11 DIAGNOSIS — I679 Cerebrovascular disease, unspecified: Secondary | ICD-10-CM | POA: Diagnosis not present

## 2024-03-11 DIAGNOSIS — F0390 Unspecified dementia without behavioral disturbance: Secondary | ICD-10-CM | POA: Diagnosis not present

## 2024-03-11 DIAGNOSIS — J439 Emphysema, unspecified: Secondary | ICD-10-CM | POA: Diagnosis not present

## 2024-03-12 DIAGNOSIS — U071 COVID-19: Secondary | ICD-10-CM | POA: Diagnosis not present

## 2024-03-12 DIAGNOSIS — Z8673 Personal history of transient ischemic attack (TIA), and cerebral infarction without residual deficits: Secondary | ICD-10-CM | POA: Diagnosis not present

## 2024-03-12 DIAGNOSIS — F0390 Unspecified dementia without behavioral disturbance: Secondary | ICD-10-CM | POA: Diagnosis not present

## 2024-03-12 DIAGNOSIS — I10 Essential (primary) hypertension: Secondary | ICD-10-CM | POA: Diagnosis not present

## 2024-03-12 DIAGNOSIS — J439 Emphysema, unspecified: Secondary | ICD-10-CM | POA: Diagnosis not present

## 2024-03-12 DIAGNOSIS — I679 Cerebrovascular disease, unspecified: Secondary | ICD-10-CM | POA: Diagnosis not present

## 2024-03-15 ENCOUNTER — Ambulatory Visit (INDEPENDENT_AMBULATORY_CARE_PROVIDER_SITE_OTHER): Payer: Medicare Other

## 2024-03-15 DIAGNOSIS — F0390 Unspecified dementia without behavioral disturbance: Secondary | ICD-10-CM | POA: Diagnosis not present

## 2024-03-15 DIAGNOSIS — U071 COVID-19: Secondary | ICD-10-CM | POA: Diagnosis not present

## 2024-03-15 DIAGNOSIS — I679 Cerebrovascular disease, unspecified: Secondary | ICD-10-CM | POA: Diagnosis not present

## 2024-03-15 DIAGNOSIS — I639 Cerebral infarction, unspecified: Secondary | ICD-10-CM

## 2024-03-15 DIAGNOSIS — I10 Essential (primary) hypertension: Secondary | ICD-10-CM | POA: Diagnosis not present

## 2024-03-15 DIAGNOSIS — Z8673 Personal history of transient ischemic attack (TIA), and cerebral infarction without residual deficits: Secondary | ICD-10-CM | POA: Diagnosis not present

## 2024-03-15 DIAGNOSIS — J439 Emphysema, unspecified: Secondary | ICD-10-CM | POA: Diagnosis not present

## 2024-03-16 LAB — CUP PACEART REMOTE DEVICE CHECK
Date Time Interrogation Session: 20250420230141
Implantable Pulse Generator Implant Date: 20210719

## 2024-03-18 DIAGNOSIS — J439 Emphysema, unspecified: Secondary | ICD-10-CM | POA: Diagnosis not present

## 2024-03-18 DIAGNOSIS — U071 COVID-19: Secondary | ICD-10-CM | POA: Diagnosis not present

## 2024-03-18 DIAGNOSIS — F0390 Unspecified dementia without behavioral disturbance: Secondary | ICD-10-CM | POA: Diagnosis not present

## 2024-03-18 DIAGNOSIS — Z8673 Personal history of transient ischemic attack (TIA), and cerebral infarction without residual deficits: Secondary | ICD-10-CM | POA: Diagnosis not present

## 2024-03-18 DIAGNOSIS — I679 Cerebrovascular disease, unspecified: Secondary | ICD-10-CM | POA: Diagnosis not present

## 2024-03-18 DIAGNOSIS — I10 Essential (primary) hypertension: Secondary | ICD-10-CM | POA: Diagnosis not present

## 2024-03-22 DIAGNOSIS — F0390 Unspecified dementia without behavioral disturbance: Secondary | ICD-10-CM | POA: Diagnosis not present

## 2024-03-22 DIAGNOSIS — U071 COVID-19: Secondary | ICD-10-CM | POA: Diagnosis not present

## 2024-03-22 DIAGNOSIS — I679 Cerebrovascular disease, unspecified: Secondary | ICD-10-CM | POA: Diagnosis not present

## 2024-03-22 DIAGNOSIS — J439 Emphysema, unspecified: Secondary | ICD-10-CM | POA: Diagnosis not present

## 2024-03-22 DIAGNOSIS — I10 Essential (primary) hypertension: Secondary | ICD-10-CM | POA: Diagnosis not present

## 2024-03-22 DIAGNOSIS — Z8673 Personal history of transient ischemic attack (TIA), and cerebral infarction without residual deficits: Secondary | ICD-10-CM | POA: Diagnosis not present

## 2024-03-23 DIAGNOSIS — U071 COVID-19: Secondary | ICD-10-CM | POA: Diagnosis not present

## 2024-03-23 DIAGNOSIS — I10 Essential (primary) hypertension: Secondary | ICD-10-CM | POA: Diagnosis not present

## 2024-03-23 DIAGNOSIS — Z8673 Personal history of transient ischemic attack (TIA), and cerebral infarction without residual deficits: Secondary | ICD-10-CM | POA: Diagnosis not present

## 2024-03-23 DIAGNOSIS — J439 Emphysema, unspecified: Secondary | ICD-10-CM | POA: Diagnosis not present

## 2024-03-23 DIAGNOSIS — I679 Cerebrovascular disease, unspecified: Secondary | ICD-10-CM | POA: Diagnosis not present

## 2024-03-23 DIAGNOSIS — F0390 Unspecified dementia without behavioral disturbance: Secondary | ICD-10-CM | POA: Diagnosis not present

## 2024-03-23 NOTE — Addendum Note (Signed)
 Addended by: Edra Govern D on: 03/23/2024 05:06 PM   Modules accepted: Orders

## 2024-03-23 NOTE — Progress Notes (Signed)
 Carelink Summary Report / Loop Recorder

## 2024-03-24 DIAGNOSIS — J439 Emphysema, unspecified: Secondary | ICD-10-CM | POA: Diagnosis not present

## 2024-03-24 DIAGNOSIS — I10 Essential (primary) hypertension: Secondary | ICD-10-CM | POA: Diagnosis not present

## 2024-03-24 DIAGNOSIS — Z8673 Personal history of transient ischemic attack (TIA), and cerebral infarction without residual deficits: Secondary | ICD-10-CM | POA: Diagnosis not present

## 2024-03-24 DIAGNOSIS — I679 Cerebrovascular disease, unspecified: Secondary | ICD-10-CM | POA: Diagnosis not present

## 2024-03-24 DIAGNOSIS — U071 COVID-19: Secondary | ICD-10-CM | POA: Diagnosis not present

## 2024-03-24 DIAGNOSIS — F0390 Unspecified dementia without behavioral disturbance: Secondary | ICD-10-CM | POA: Diagnosis not present

## 2024-03-25 DIAGNOSIS — J449 Chronic obstructive pulmonary disease, unspecified: Secondary | ICD-10-CM | POA: Diagnosis not present

## 2024-03-25 DIAGNOSIS — Z8673 Personal history of transient ischemic attack (TIA), and cerebral infarction without residual deficits: Secondary | ICD-10-CM | POA: Diagnosis not present

## 2024-03-25 DIAGNOSIS — J439 Emphysema, unspecified: Secondary | ICD-10-CM | POA: Diagnosis not present

## 2024-03-25 DIAGNOSIS — Z85118 Personal history of other malignant neoplasm of bronchus and lung: Secondary | ICD-10-CM | POA: Diagnosis not present

## 2024-03-25 DIAGNOSIS — Z9981 Dependence on supplemental oxygen: Secondary | ICD-10-CM | POA: Diagnosis not present

## 2024-03-25 DIAGNOSIS — I10 Essential (primary) hypertension: Secondary | ICD-10-CM | POA: Diagnosis not present

## 2024-03-25 DIAGNOSIS — I509 Heart failure, unspecified: Secondary | ICD-10-CM | POA: Diagnosis not present

## 2024-03-25 DIAGNOSIS — N1832 Chronic kidney disease, stage 3b: Secondary | ICD-10-CM | POA: Diagnosis not present

## 2024-03-25 DIAGNOSIS — E785 Hyperlipidemia, unspecified: Secondary | ICD-10-CM | POA: Diagnosis not present

## 2024-03-25 DIAGNOSIS — U071 COVID-19: Secondary | ICD-10-CM | POA: Diagnosis not present

## 2024-03-25 DIAGNOSIS — I679 Cerebrovascular disease, unspecified: Secondary | ICD-10-CM | POA: Diagnosis not present

## 2024-03-25 DIAGNOSIS — F0390 Unspecified dementia without behavioral disturbance: Secondary | ICD-10-CM | POA: Diagnosis not present

## 2024-03-26 DIAGNOSIS — U071 COVID-19: Secondary | ICD-10-CM | POA: Diagnosis not present

## 2024-03-26 DIAGNOSIS — J439 Emphysema, unspecified: Secondary | ICD-10-CM | POA: Diagnosis not present

## 2024-03-26 DIAGNOSIS — I679 Cerebrovascular disease, unspecified: Secondary | ICD-10-CM | POA: Diagnosis not present

## 2024-03-26 DIAGNOSIS — Z8673 Personal history of transient ischemic attack (TIA), and cerebral infarction without residual deficits: Secondary | ICD-10-CM | POA: Diagnosis not present

## 2024-03-26 DIAGNOSIS — F0390 Unspecified dementia without behavioral disturbance: Secondary | ICD-10-CM | POA: Diagnosis not present

## 2024-03-26 DIAGNOSIS — I10 Essential (primary) hypertension: Secondary | ICD-10-CM | POA: Diagnosis not present

## 2024-03-29 DIAGNOSIS — J439 Emphysema, unspecified: Secondary | ICD-10-CM | POA: Diagnosis not present

## 2024-03-29 DIAGNOSIS — F0390 Unspecified dementia without behavioral disturbance: Secondary | ICD-10-CM | POA: Diagnosis not present

## 2024-03-29 DIAGNOSIS — I679 Cerebrovascular disease, unspecified: Secondary | ICD-10-CM | POA: Diagnosis not present

## 2024-03-29 DIAGNOSIS — U071 COVID-19: Secondary | ICD-10-CM | POA: Diagnosis not present

## 2024-03-29 DIAGNOSIS — Z8673 Personal history of transient ischemic attack (TIA), and cerebral infarction without residual deficits: Secondary | ICD-10-CM | POA: Diagnosis not present

## 2024-03-29 DIAGNOSIS — I10 Essential (primary) hypertension: Secondary | ICD-10-CM | POA: Diagnosis not present

## 2024-03-30 ENCOUNTER — Telehealth: Payer: Self-pay

## 2024-03-30 DIAGNOSIS — U071 COVID-19: Secondary | ICD-10-CM | POA: Diagnosis not present

## 2024-03-30 DIAGNOSIS — I10 Essential (primary) hypertension: Secondary | ICD-10-CM | POA: Diagnosis not present

## 2024-03-30 DIAGNOSIS — F0390 Unspecified dementia without behavioral disturbance: Secondary | ICD-10-CM | POA: Diagnosis not present

## 2024-03-30 DIAGNOSIS — J439 Emphysema, unspecified: Secondary | ICD-10-CM | POA: Diagnosis not present

## 2024-03-30 DIAGNOSIS — I679 Cerebrovascular disease, unspecified: Secondary | ICD-10-CM | POA: Diagnosis not present

## 2024-03-30 DIAGNOSIS — Z8673 Personal history of transient ischemic attack (TIA), and cerebral infarction without residual deficits: Secondary | ICD-10-CM | POA: Diagnosis not present

## 2024-03-30 NOTE — Telephone Encounter (Signed)
 Alert received from [employer] for possible AF w/ RVR vs. ST e/ ectopy. Routing to Dr. Daneil Dunker who is in office as Dr. Rodolfo Clan is out of office to advise. If AF w/ RVR, is AF clinic ok for referral? No OAC on MAR.

## 2024-03-30 NOTE — Telephone Encounter (Signed)
 Spoke to patients daughter April who advises patient is on hospice/bed bound and started Morphine  today. Advised I will forward to Dr. Daneil Dunker for update. Will not send AF clinic referral considering hospice, April was made aware.

## 2024-03-31 DIAGNOSIS — I10 Essential (primary) hypertension: Secondary | ICD-10-CM | POA: Diagnosis not present

## 2024-03-31 DIAGNOSIS — I679 Cerebrovascular disease, unspecified: Secondary | ICD-10-CM | POA: Diagnosis not present

## 2024-03-31 DIAGNOSIS — F0390 Unspecified dementia without behavioral disturbance: Secondary | ICD-10-CM | POA: Diagnosis not present

## 2024-03-31 DIAGNOSIS — U071 COVID-19: Secondary | ICD-10-CM | POA: Diagnosis not present

## 2024-03-31 DIAGNOSIS — J439 Emphysema, unspecified: Secondary | ICD-10-CM | POA: Diagnosis not present

## 2024-03-31 DIAGNOSIS — Z8673 Personal history of transient ischemic attack (TIA), and cerebral infarction without residual deficits: Secondary | ICD-10-CM | POA: Diagnosis not present

## 2024-04-01 DIAGNOSIS — J439 Emphysema, unspecified: Secondary | ICD-10-CM | POA: Diagnosis not present

## 2024-04-01 DIAGNOSIS — U071 COVID-19: Secondary | ICD-10-CM | POA: Diagnosis not present

## 2024-04-01 DIAGNOSIS — I10 Essential (primary) hypertension: Secondary | ICD-10-CM | POA: Diagnosis not present

## 2024-04-01 DIAGNOSIS — F0390 Unspecified dementia without behavioral disturbance: Secondary | ICD-10-CM | POA: Diagnosis not present

## 2024-04-01 DIAGNOSIS — I679 Cerebrovascular disease, unspecified: Secondary | ICD-10-CM | POA: Diagnosis not present

## 2024-04-01 DIAGNOSIS — Z8673 Personal history of transient ischemic attack (TIA), and cerebral infarction without residual deficits: Secondary | ICD-10-CM | POA: Diagnosis not present

## 2024-04-02 DIAGNOSIS — F0390 Unspecified dementia without behavioral disturbance: Secondary | ICD-10-CM | POA: Diagnosis not present

## 2024-04-02 DIAGNOSIS — U071 COVID-19: Secondary | ICD-10-CM | POA: Diagnosis not present

## 2024-04-02 DIAGNOSIS — J439 Emphysema, unspecified: Secondary | ICD-10-CM | POA: Diagnosis not present

## 2024-04-02 DIAGNOSIS — I679 Cerebrovascular disease, unspecified: Secondary | ICD-10-CM | POA: Diagnosis not present

## 2024-04-02 DIAGNOSIS — I10 Essential (primary) hypertension: Secondary | ICD-10-CM | POA: Diagnosis not present

## 2024-04-02 DIAGNOSIS — Z8673 Personal history of transient ischemic attack (TIA), and cerebral infarction without residual deficits: Secondary | ICD-10-CM | POA: Diagnosis not present

## 2024-04-03 DIAGNOSIS — J439 Emphysema, unspecified: Secondary | ICD-10-CM | POA: Diagnosis not present

## 2024-04-03 DIAGNOSIS — F0390 Unspecified dementia without behavioral disturbance: Secondary | ICD-10-CM | POA: Diagnosis not present

## 2024-04-03 DIAGNOSIS — I10 Essential (primary) hypertension: Secondary | ICD-10-CM | POA: Diagnosis not present

## 2024-04-03 DIAGNOSIS — U071 COVID-19: Secondary | ICD-10-CM | POA: Diagnosis not present

## 2024-04-03 DIAGNOSIS — Z8673 Personal history of transient ischemic attack (TIA), and cerebral infarction without residual deficits: Secondary | ICD-10-CM | POA: Diagnosis not present

## 2024-04-03 DIAGNOSIS — I679 Cerebrovascular disease, unspecified: Secondary | ICD-10-CM | POA: Diagnosis not present

## 2024-04-25 DEATH — deceased

## 2024-04-30 NOTE — Addendum Note (Signed)
 Addended by: Edra Govern D on: 04/30/2024 10:14 AM   Modules accepted: Orders

## 2024-04-30 NOTE — Progress Notes (Signed)
 Carelink Summary Report / Loop Recorder

## 2024-07-29 ENCOUNTER — Encounter

## 2024-08-30 ENCOUNTER — Encounter

## 2024-09-30 ENCOUNTER — Encounter

## 2024-11-01 ENCOUNTER — Encounter

## 2024-12-02 ENCOUNTER — Encounter
